# Patient Record
Sex: Female | Born: 1955 | Race: White | Hispanic: No | Marital: Married | Smoking: Former smoker
Health system: Southern US, Community
[De-identification: ages and names within clinical notes are randomized; demographics above are authoritative.]

## PROBLEM LIST (undated history)

## (undated) DIAGNOSIS — Z87898 Personal history of other specified conditions: Secondary | ICD-10-CM

## (undated) DIAGNOSIS — M797 Fibromyalgia: Secondary | ICD-10-CM

## (undated) DIAGNOSIS — F329 Major depressive disorder, single episode, unspecified: Secondary | ICD-10-CM

## (undated) DIAGNOSIS — J449 Chronic obstructive pulmonary disease, unspecified: Secondary | ICD-10-CM

## (undated) DIAGNOSIS — E785 Hyperlipidemia, unspecified: Secondary | ICD-10-CM

## (undated) DIAGNOSIS — Z87442 Personal history of urinary calculi: Secondary | ICD-10-CM

## (undated) DIAGNOSIS — F419 Anxiety disorder, unspecified: Secondary | ICD-10-CM

## (undated) DIAGNOSIS — Z9889 Other specified postprocedural states: Secondary | ICD-10-CM

## (undated) DIAGNOSIS — Z86010 Personal history of colonic polyps: Secondary | ICD-10-CM

## (undated) DIAGNOSIS — D649 Anemia, unspecified: Secondary | ICD-10-CM

## (undated) DIAGNOSIS — N189 Chronic kidney disease, unspecified: Secondary | ICD-10-CM

## (undated) DIAGNOSIS — C801 Malignant (primary) neoplasm, unspecified: Secondary | ICD-10-CM

## (undated) DIAGNOSIS — I10 Essential (primary) hypertension: Secondary | ICD-10-CM

## (undated) DIAGNOSIS — N201 Calculus of ureter: Secondary | ICD-10-CM

## (undated) DIAGNOSIS — F32A Depression, unspecified: Secondary | ICD-10-CM

## (undated) DIAGNOSIS — G2581 Restless legs syndrome: Secondary | ICD-10-CM

## (undated) DIAGNOSIS — Z860101 Personal history of adenomatous and serrated colon polyps: Secondary | ICD-10-CM

## (undated) DIAGNOSIS — G8929 Other chronic pain: Secondary | ICD-10-CM

## (undated) DIAGNOSIS — M1990 Unspecified osteoarthritis, unspecified site: Secondary | ICD-10-CM

## (undated) DIAGNOSIS — E079 Disorder of thyroid, unspecified: Secondary | ICD-10-CM

## (undated) DIAGNOSIS — K760 Fatty (change of) liver, not elsewhere classified: Secondary | ICD-10-CM

## (undated) DIAGNOSIS — M549 Dorsalgia, unspecified: Secondary | ICD-10-CM

## (undated) DIAGNOSIS — U099 Post covid-19 condition, unspecified: Secondary | ICD-10-CM

## (undated) DIAGNOSIS — R918 Other nonspecific abnormal finding of lung field: Secondary | ICD-10-CM

## (undated) DIAGNOSIS — Z859 Personal history of malignant neoplasm, unspecified: Secondary | ICD-10-CM

## (undated) DIAGNOSIS — R319 Hematuria, unspecified: Secondary | ICD-10-CM

## (undated) DIAGNOSIS — R011 Cardiac murmur, unspecified: Secondary | ICD-10-CM

## (undated) DIAGNOSIS — J189 Pneumonia, unspecified organism: Secondary | ICD-10-CM

## (undated) DIAGNOSIS — R569 Unspecified convulsions: Secondary | ICD-10-CM

## (undated) DIAGNOSIS — R0600 Dyspnea, unspecified: Secondary | ICD-10-CM

## (undated) DIAGNOSIS — M810 Age-related osteoporosis without current pathological fracture: Secondary | ICD-10-CM

## (undated) DIAGNOSIS — K219 Gastro-esophageal reflux disease without esophagitis: Secondary | ICD-10-CM

## (undated) DIAGNOSIS — N3010 Interstitial cystitis (chronic) without hematuria: Secondary | ICD-10-CM

## (undated) DIAGNOSIS — E039 Hypothyroidism, unspecified: Secondary | ICD-10-CM

## (undated) HISTORY — PX: SPINAL CORD STIMULATOR IMPLANT: SHX2422

## (undated) HISTORY — DX: Depression, unspecified: F32.A

## (undated) HISTORY — DX: Anemia, unspecified: D64.9

## (undated) HISTORY — PX: UPPER GASTROINTESTINAL ENDOSCOPY: SHX188

## (undated) HISTORY — DX: Chronic kidney disease, unspecified: N18.9

## (undated) HISTORY — PX: CARPAL TUNNEL RELEASE: SHX101

## (undated) HISTORY — DX: Personal history of colonic polyps: Z86.010

## (undated) HISTORY — PX: COLONOSCOPY: SHX174

## (undated) HISTORY — DX: Fibromyalgia: M79.7

## (undated) HISTORY — DX: Cardiac murmur, unspecified: R01.1

## (undated) HISTORY — PX: OTHER SURGICAL HISTORY: SHX169

## (undated) HISTORY — DX: Unspecified convulsions: R56.9

## (undated) HISTORY — DX: Gastro-esophageal reflux disease without esophagitis: K21.9

## (undated) HISTORY — DX: Anxiety disorder, unspecified: F41.9

## (undated) HISTORY — DX: Malignant (primary) neoplasm, unspecified: C80.1

## (undated) HISTORY — DX: Dorsalgia, unspecified: M54.9

## (undated) HISTORY — DX: Personal history of adenomatous and serrated colon polyps: Z86.0101

## (undated) HISTORY — DX: Other chronic pain: G89.29

## (undated) HISTORY — DX: Age-related osteoporosis without current pathological fracture: M81.0

## (undated) HISTORY — DX: Major depressive disorder, single episode, unspecified: F32.9

## (undated) HISTORY — DX: Disorder of thyroid, unspecified: E07.9

---
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
## 1982-10-25 HISTORY — PX: VAGINAL HYSTERECTOMY: SUR661

## 1990-10-25 HISTORY — PX: CERVICAL FUSION: SHX112

## 1993-10-25 HISTORY — PX: CHOLECYSTECTOMY: SHX55

## 1994-10-25 HISTORY — PX: LUMBAR FUSION: SHX111

## 1996-10-25 HISTORY — PX: CARPAL TUNNEL RELEASE: SHX101

## 1998-03-13 ENCOUNTER — Inpatient Hospital Stay (HOSPITAL_COMMUNITY): Admission: EM | Admit: 1998-03-13 | Discharge: 1998-03-17 | Payer: Self-pay | Admitting: Emergency Medicine

## 2001-04-12 ENCOUNTER — Emergency Department (HOSPITAL_COMMUNITY): Admission: EM | Admit: 2001-04-12 | Discharge: 2001-04-12 | Payer: Self-pay | Admitting: Emergency Medicine

## 2001-04-12 ENCOUNTER — Encounter: Payer: Self-pay | Admitting: Emergency Medicine

## 2002-10-25 HISTORY — PX: ROTATOR CUFF REPAIR: SHX139

## 2004-01-03 ENCOUNTER — Observation Stay (HOSPITAL_COMMUNITY): Admission: RE | Admit: 2004-01-03 | Discharge: 2004-01-04 | Payer: Self-pay | Admitting: General Surgery

## 2004-01-03 HISTORY — PX: HEMORRHOID SURGERY: SHX153

## 2004-02-05 ENCOUNTER — Ambulatory Visit (HOSPITAL_COMMUNITY): Admission: RE | Admit: 2004-02-05 | Discharge: 2004-02-05 | Payer: Self-pay | Admitting: General Surgery

## 2004-02-05 HISTORY — PX: WRIST GANGLION EXCISION: SUR520

## 2004-05-12 ENCOUNTER — Encounter: Admission: RE | Admit: 2004-05-12 | Discharge: 2004-05-12 | Payer: Self-pay | Admitting: Orthopedic Surgery

## 2004-09-01 ENCOUNTER — Ambulatory Visit: Payer: Self-pay | Admitting: Internal Medicine

## 2004-09-02 ENCOUNTER — Ambulatory Visit: Payer: Self-pay

## 2004-09-02 HISTORY — PX: CARDIOVASCULAR STRESS TEST: SHX262

## 2004-09-16 ENCOUNTER — Ambulatory Visit: Payer: Self-pay | Admitting: Internal Medicine

## 2004-11-06 ENCOUNTER — Ambulatory Visit: Payer: Self-pay | Admitting: Internal Medicine

## 2004-11-12 ENCOUNTER — Ambulatory Visit: Payer: Self-pay | Admitting: Internal Medicine

## 2005-02-10 ENCOUNTER — Ambulatory Visit: Payer: Self-pay | Admitting: Internal Medicine

## 2005-03-26 ENCOUNTER — Ambulatory Visit (HOSPITAL_COMMUNITY): Admission: RE | Admit: 2005-03-26 | Discharge: 2005-03-26 | Payer: Self-pay | Admitting: Pediatrics

## 2005-04-22 ENCOUNTER — Encounter (INDEPENDENT_AMBULATORY_CARE_PROVIDER_SITE_OTHER): Payer: Self-pay | Admitting: Specialist

## 2005-04-22 ENCOUNTER — Encounter: Admission: RE | Admit: 2005-04-22 | Discharge: 2005-04-22 | Payer: Self-pay | Admitting: General Surgery

## 2005-05-13 ENCOUNTER — Ambulatory Visit: Payer: Self-pay | Admitting: Internal Medicine

## 2005-06-11 ENCOUNTER — Ambulatory Visit: Payer: Self-pay | Admitting: Internal Medicine

## 2005-07-06 ENCOUNTER — Ambulatory Visit: Payer: Self-pay | Admitting: Internal Medicine

## 2005-09-06 ENCOUNTER — Ambulatory Visit: Payer: Self-pay | Admitting: Internal Medicine

## 2005-11-08 ENCOUNTER — Ambulatory Visit: Payer: Self-pay | Admitting: Internal Medicine

## 2005-11-17 ENCOUNTER — Ambulatory Visit: Payer: Self-pay | Admitting: Internal Medicine

## 2005-11-24 ENCOUNTER — Encounter (INDEPENDENT_AMBULATORY_CARE_PROVIDER_SITE_OTHER): Payer: Self-pay | Admitting: Specialist

## 2005-11-24 ENCOUNTER — Ambulatory Visit: Payer: Self-pay | Admitting: Internal Medicine

## 2005-12-08 ENCOUNTER — Ambulatory Visit: Payer: Self-pay | Admitting: Internal Medicine

## 2006-01-26 ENCOUNTER — Ambulatory Visit: Payer: Self-pay | Admitting: Internal Medicine

## 2006-03-04 ENCOUNTER — Ambulatory Visit (HOSPITAL_COMMUNITY): Admission: RE | Admit: 2006-03-04 | Discharge: 2006-03-04 | Payer: Self-pay | Admitting: Urology

## 2006-03-04 HISTORY — PX: OTHER SURGICAL HISTORY: SHX169

## 2006-03-28 ENCOUNTER — Ambulatory Visit: Payer: Self-pay | Admitting: Internal Medicine

## 2006-04-01 ENCOUNTER — Ambulatory Visit (HOSPITAL_BASED_OUTPATIENT_CLINIC_OR_DEPARTMENT_OTHER): Admission: RE | Admit: 2006-04-01 | Discharge: 2006-04-01 | Payer: Self-pay | Admitting: Urology

## 2006-05-24 ENCOUNTER — Ambulatory Visit: Payer: Self-pay | Admitting: Internal Medicine

## 2006-05-26 ENCOUNTER — Ambulatory Visit (HOSPITAL_COMMUNITY): Admission: RE | Admit: 2006-05-26 | Discharge: 2006-05-26 | Payer: Self-pay | Admitting: General Surgery

## 2006-05-31 ENCOUNTER — Ambulatory Visit: Payer: Self-pay | Admitting: Internal Medicine

## 2006-05-31 ENCOUNTER — Other Ambulatory Visit: Admission: RE | Admit: 2006-05-31 | Discharge: 2006-05-31 | Payer: Self-pay | Admitting: Internal Medicine

## 2006-05-31 ENCOUNTER — Encounter: Payer: Self-pay | Admitting: Internal Medicine

## 2006-08-01 ENCOUNTER — Ambulatory Visit: Payer: Self-pay | Admitting: Internal Medicine

## 2006-09-06 ENCOUNTER — Ambulatory Visit: Payer: Self-pay | Admitting: Internal Medicine

## 2006-09-16 ENCOUNTER — Ambulatory Visit: Payer: Self-pay | Admitting: Internal Medicine

## 2006-10-31 ENCOUNTER — Ambulatory Visit: Payer: Self-pay | Admitting: Internal Medicine

## 2006-11-04 ENCOUNTER — Ambulatory Visit: Payer: Self-pay | Admitting: Internal Medicine

## 2006-11-07 ENCOUNTER — Ambulatory Visit: Payer: Self-pay | Admitting: Internal Medicine

## 2006-11-07 LAB — CONVERTED CEMR LAB
ALT: 92 units/L — ABNORMAL HIGH (ref 0–40)
AST: 49 units/L — ABNORMAL HIGH (ref 0–37)
Albumin: 3.8 g/dL (ref 3.5–5.2)
Alkaline Phosphatase: 80 units/L (ref 39–117)
Bilirubin, Direct: 0.1 mg/dL (ref 0.0–0.3)
Chol/HDL Ratio, serum: 3.4
Cholesterol: 228 mg/dL (ref 0–200)
HDL: 66.1 mg/dL (ref >39.0)
LDL DIRECT: 142.5 mg/dL
Total Bilirubin: 0.7 mg/dL (ref 0.3–1.2)
Total Protein: 6.2 g/dL (ref 6.0–8.3)
Triglyceride fasting, serum: 59 mg/dL (ref 0–149)
VLDL: 12 mg/dL (ref 0–40)

## 2006-11-18 ENCOUNTER — Ambulatory Visit: Payer: Self-pay | Admitting: Internal Medicine

## 2006-12-06 ENCOUNTER — Encounter: Admission: RE | Admit: 2006-12-06 | Discharge: 2007-03-06 | Payer: Self-pay | Admitting: Neurology

## 2006-12-15 ENCOUNTER — Ambulatory Visit: Payer: Self-pay | Admitting: Internal Medicine

## 2007-01-06 ENCOUNTER — Ambulatory Visit: Payer: Self-pay | Admitting: Internal Medicine

## 2007-01-06 LAB — CONVERTED CEMR LAB
ALT: 117 units/L — ABNORMAL HIGH (ref 0–40)
AST: 48 units/L — ABNORMAL HIGH (ref 0–37)
Albumin: 3.9 g/dL (ref 3.5–5.2)
Alkaline Phosphatase: 106 units/L (ref 39–117)
Total Bilirubin: 0.6 mg/dL (ref 0.3–1.2)
Total Protein: 6.5 g/dL (ref 6.0–8.3)

## 2007-02-06 ENCOUNTER — Encounter: Admission: RE | Admit: 2007-02-06 | Discharge: 2007-02-06 | Payer: Self-pay | Admitting: Neurology

## 2007-03-09 ENCOUNTER — Ambulatory Visit: Payer: Self-pay | Admitting: Internal Medicine

## 2007-03-09 LAB — CONVERTED CEMR LAB
ALT: 31 units/L (ref 0–40)
Alkaline Phosphatase: 80 units/L (ref 39–117)
Bilirubin, Direct: 0.2 mg/dL (ref 0.0–0.3)
Direct LDL: 142.9 mg/dL
Total Bilirubin: 0.5 mg/dL (ref 0.3–1.2)
Total CHOL/HDL Ratio: 3.6

## 2007-04-12 ENCOUNTER — Ambulatory Visit: Payer: Self-pay | Admitting: Internal Medicine

## 2007-04-21 DIAGNOSIS — IMO0001 Reserved for inherently not codable concepts without codable children: Secondary | ICD-10-CM

## 2007-04-21 DIAGNOSIS — M797 Fibromyalgia: Secondary | ICD-10-CM | POA: Insufficient documentation

## 2007-04-21 DIAGNOSIS — M545 Low back pain: Secondary | ICD-10-CM

## 2007-06-09 ENCOUNTER — Ambulatory Visit: Payer: Self-pay | Admitting: Internal Medicine

## 2007-06-09 DIAGNOSIS — G43109 Migraine with aura, not intractable, without status migrainosus: Secondary | ICD-10-CM | POA: Insufficient documentation

## 2007-06-09 DIAGNOSIS — R413 Other amnesia: Secondary | ICD-10-CM

## 2007-08-11 ENCOUNTER — Ambulatory Visit: Payer: Self-pay | Admitting: Internal Medicine

## 2007-09-13 ENCOUNTER — Telehealth: Payer: Self-pay | Admitting: Internal Medicine

## 2007-12-21 ENCOUNTER — Ambulatory Visit: Payer: Self-pay | Admitting: Internal Medicine

## 2007-12-21 DIAGNOSIS — M7520 Bicipital tendinitis, unspecified shoulder: Secondary | ICD-10-CM | POA: Insufficient documentation

## 2007-12-21 DIAGNOSIS — Z87891 Personal history of nicotine dependence: Secondary | ICD-10-CM | POA: Insufficient documentation

## 2008-02-17 DIAGNOSIS — Z9089 Acquired absence of other organs: Secondary | ICD-10-CM

## 2008-02-17 DIAGNOSIS — K645 Perianal venous thrombosis: Secondary | ICD-10-CM | POA: Insufficient documentation

## 2008-02-17 DIAGNOSIS — D126 Benign neoplasm of colon, unspecified: Secondary | ICD-10-CM | POA: Insufficient documentation

## 2008-02-17 DIAGNOSIS — E785 Hyperlipidemia, unspecified: Secondary | ICD-10-CM | POA: Insufficient documentation

## 2008-02-17 DIAGNOSIS — N200 Calculus of kidney: Secondary | ICD-10-CM | POA: Insufficient documentation

## 2008-02-17 DIAGNOSIS — Z9889 Other specified postprocedural states: Secondary | ICD-10-CM

## 2008-02-17 DIAGNOSIS — E8941 Symptomatic postprocedural ovarian failure: Secondary | ICD-10-CM

## 2008-03-19 ENCOUNTER — Ambulatory Visit: Payer: Self-pay | Admitting: Internal Medicine

## 2008-03-19 DIAGNOSIS — F339 Major depressive disorder, recurrent, unspecified: Secondary | ICD-10-CM | POA: Insufficient documentation

## 2008-03-19 DIAGNOSIS — N760 Acute vaginitis: Secondary | ICD-10-CM | POA: Insufficient documentation

## 2008-03-19 LAB — CONVERTED CEMR LAB
Albumin: 3.8 g/dL (ref 3.5–5.2)
Total Bilirubin: 0.5 mg/dL (ref 0.3–1.2)
Total CHOL/HDL Ratio: 4.2
VLDL: 11 mg/dL (ref 0–40)

## 2008-06-25 ENCOUNTER — Telehealth: Payer: Self-pay | Admitting: Internal Medicine

## 2008-06-26 ENCOUNTER — Ambulatory Visit: Payer: Self-pay | Admitting: Internal Medicine

## 2008-06-26 ENCOUNTER — Inpatient Hospital Stay (HOSPITAL_COMMUNITY): Admission: AD | Admit: 2008-06-26 | Discharge: 2008-07-02 | Payer: Self-pay | Admitting: *Deleted

## 2008-06-26 ENCOUNTER — Ambulatory Visit: Payer: Self-pay | Admitting: *Deleted

## 2008-06-26 DIAGNOSIS — F333 Major depressive disorder, recurrent, severe with psychotic symptoms: Secondary | ICD-10-CM | POA: Insufficient documentation

## 2008-07-02 ENCOUNTER — Telehealth: Payer: Self-pay | Admitting: Internal Medicine

## 2008-07-10 ENCOUNTER — Ambulatory Visit (HOSPITAL_COMMUNITY): Payer: Self-pay | Admitting: Psychiatry

## 2008-07-18 ENCOUNTER — Encounter: Payer: Self-pay | Admitting: Internal Medicine

## 2008-07-22 ENCOUNTER — Ambulatory Visit (HOSPITAL_COMMUNITY): Payer: Self-pay | Admitting: Psychiatry

## 2008-07-25 ENCOUNTER — Ambulatory Visit (HOSPITAL_COMMUNITY): Admission: RE | Admit: 2008-07-25 | Discharge: 2008-07-25 | Payer: Self-pay | Admitting: Preventative Medicine

## 2008-07-29 ENCOUNTER — Other Ambulatory Visit: Admission: RE | Admit: 2008-07-29 | Discharge: 2008-07-29 | Payer: Self-pay | Admitting: Gynecology

## 2008-07-29 ENCOUNTER — Ambulatory Visit: Payer: Self-pay | Admitting: Gynecology

## 2008-10-11 ENCOUNTER — Telehealth: Payer: Self-pay | Admitting: Internal Medicine

## 2008-12-27 ENCOUNTER — Ambulatory Visit (HOSPITAL_BASED_OUTPATIENT_CLINIC_OR_DEPARTMENT_OTHER): Admission: RE | Admit: 2008-12-27 | Discharge: 2008-12-27 | Payer: Self-pay | Admitting: Urology

## 2009-01-20 ENCOUNTER — Ambulatory Visit: Payer: Self-pay | Admitting: Internal Medicine

## 2009-01-20 DIAGNOSIS — N3010 Interstitial cystitis (chronic) without hematuria: Secondary | ICD-10-CM | POA: Insufficient documentation

## 2009-03-10 ENCOUNTER — Telehealth: Payer: Self-pay | Admitting: Internal Medicine

## 2009-03-14 ENCOUNTER — Ambulatory Visit: Payer: Self-pay | Admitting: Internal Medicine

## 2009-03-14 DIAGNOSIS — G2581 Restless legs syndrome: Secondary | ICD-10-CM | POA: Insufficient documentation

## 2009-03-14 LAB — CONVERTED CEMR LAB
Cholesterol, target level: 200 mg/dL
HDL goal, serum: 40 mg/dL
LDL Goal: 130 mg/dL

## 2009-04-03 ENCOUNTER — Ambulatory Visit (HOSPITAL_COMMUNITY): Admission: RE | Admit: 2009-04-03 | Discharge: 2009-04-03 | Payer: Self-pay | Admitting: General Surgery

## 2009-05-16 ENCOUNTER — Ambulatory Visit: Payer: Self-pay | Admitting: Internal Medicine

## 2009-05-16 DIAGNOSIS — R252 Cramp and spasm: Secondary | ICD-10-CM | POA: Insufficient documentation

## 2009-05-16 LAB — CONVERTED CEMR LAB
BUN: 20 mg/dL (ref 6–23)
CO2: 27 meq/L (ref 19–32)
Calcium: 9.3 mg/dL (ref 8.4–10.5)
GFR calc non Af Amer: 68.99 mL/min (ref >60)
Glucose, Bld: 94 mg/dL (ref 70–99)
Sodium: 141 meq/L (ref 135–145)

## 2009-08-18 ENCOUNTER — Ambulatory Visit: Payer: Self-pay | Admitting: Internal Medicine

## 2009-08-18 LAB — CONVERTED CEMR LAB
CO2: 26 meq/L (ref 19–32)
Calcium: 9.1 mg/dL (ref 8.4–10.5)
Chloride: 109 meq/L (ref 96–112)
Cholesterol: 257 mg/dL — ABNORMAL HIGH (ref 0–200)
Creatinine, Ser: 0.7 mg/dL (ref 0.4–1.2)
Glucose, Bld: 95 mg/dL (ref 70–99)
TSH: 2.28 microintl units/mL (ref 0.35–5.50)

## 2009-11-18 ENCOUNTER — Ambulatory Visit: Payer: Self-pay | Admitting: Internal Medicine

## 2009-11-18 DIAGNOSIS — J0100 Acute maxillary sinusitis, unspecified: Secondary | ICD-10-CM

## 2010-02-10 ENCOUNTER — Ambulatory Visit: Payer: Self-pay | Admitting: Internal Medicine

## 2010-02-10 LAB — CONVERTED CEMR LAB
Basophils Relative: 0.8 % (ref 0.0–3.0)
Eosinophils Absolute: 0 10*3/uL (ref 0.0–0.7)
Eosinophils Relative: 0.6 % (ref 0.0–5.0)
HCT: 43.2 % (ref 36.0–46.0)
HDL goal, serum: 40 mg/dL
HDL: 77 mg/dL (ref >39.00)
Lymphs Abs: 2.2 10*3/uL (ref 0.7–4.0)
MCV: 97.2 fL (ref 78.0–100.0)
Monocytes Absolute: 0.4 10*3/uL (ref 0.1–1.0)
Platelets: 188 10*3/uL (ref 150.0–400.0)
RDW: 14 % (ref 11.5–14.6)
WBC: 8.2 10*3/uL (ref 4.5–10.5)

## 2010-02-25 ENCOUNTER — Ambulatory Visit: Payer: Self-pay | Admitting: Licensed Clinical Social Worker

## 2010-03-11 ENCOUNTER — Ambulatory Visit: Payer: Self-pay | Admitting: Licensed Clinical Social Worker

## 2010-03-18 ENCOUNTER — Ambulatory Visit: Payer: Self-pay | Admitting: Licensed Clinical Social Worker

## 2010-03-25 ENCOUNTER — Ambulatory Visit: Payer: Self-pay | Admitting: Licensed Clinical Social Worker

## 2010-04-01 ENCOUNTER — Ambulatory Visit: Payer: Self-pay | Admitting: Licensed Clinical Social Worker

## 2010-04-08 ENCOUNTER — Ambulatory Visit: Payer: Self-pay | Admitting: Licensed Clinical Social Worker

## 2010-04-16 ENCOUNTER — Ambulatory Visit (HOSPITAL_COMMUNITY): Admission: RE | Admit: 2010-04-16 | Discharge: 2010-04-16 | Payer: Self-pay | Admitting: Internal Medicine

## 2010-04-16 LAB — HM MAMMOGRAPHY

## 2010-05-12 ENCOUNTER — Ambulatory Visit: Payer: Self-pay | Admitting: Internal Medicine

## 2010-05-13 ENCOUNTER — Telehealth: Payer: Self-pay | Admitting: Internal Medicine

## 2010-11-24 NOTE — Assessment & Plan Note (Signed)
Summary: 3 month fup//ccm   Vital Signs:  Patient profile:   55 year old female Height:      62 inches Weight:      120 pounds BMI:     22.03 Temp:     98.2 degrees F oral Pulse rate:   72 / minute Resp:     14 per minute BP sitting:   120 / 70  (left arm)  Vitals Entered By: Willy Eddy, LPN (May 12, 2010 2:03 PM) CC: roa   CC:  roa.  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) 6)  Vaginitis, Bacterial, Recurrent  (ICD-616.10) 7)  Depression  (ICD-311) 8)  Cholecystectomy, Laparoscopic, Hx of  (ICD-V45.79) 9)  Hemorrhoidectomy, Hx of  (ICD-V45.89) 10)  Menopause, Surgical  (ICD-627.4) 11)  Hyperlipidemia  (ICD-272.4) 12)  Tubulovillous Adenoma, Colon  (ICD-211.3) 13)  Hemorrhoid, Thrombosed  (ICD-455.7) 14)  Nephrolithiasis  (ICD-592.0) 15)  Bicipital Tenosynovitis  (ICD-726.12) 16)  Tobacco Use  (ICD-305.1) 17)  Preventive Health Care  (ICD-V70.0) 18)  Symptom, Memory Loss  (ICD-780.93) 19)  Migraine, Classical w/o Intractable Migraine  (ICD-346.00) 20)  Fibromyalgia  (ICD-729.1) 21)  Back Pain, Chronic  (ICD-724.5)  Current Problems (verified): 1)  Acute Maxillary Sinusitis  (ICD-461.0) 2)  Leg Cramps  (ICD-729.82) 3)  Restless Leg Syndrome, Severe  (ICD-333.94) 4)  Chronic Interstitial Cystitis  (ICD-595.1) 5)  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  W/psychot Bhv  (ICD-296.34) 6)  Vaginitis, Bacterial, Recurrent  (ICD-616.10) 7)  Depression  (ICD-311) 8)  Cholecystectomy, Laparoscopic, Hx of  (ICD-V45.79) 9)  Hemorrhoidectomy, Hx of  (ICD-V45.89) 10)  Menopause, Surgical  (ICD-627.4) 11)  Hyperlipidemia  (ICD-272.4) 12)  Tubulovillous Adenoma, Colon  (ICD-211.3) 13)  Hemorrhoid, Thrombosed  (ICD-455.7) 14)  Nephrolithiasis  (ICD-592.0) 15)  Bicipital Tenosynovitis  (ICD-726.12) 16)  Tobacco Use  (ICD-305.1) 17)  Preventive Health Care  (ICD-V70.0) 18)  Symptom, Memory Loss  (ICD-780.93) 19)  Migraine, Classical w/o Intractable Migraine  (ICD-346.00) 20)  Fibromyalgia  (ICD-729.1) 21)  Back Pain, Chronic  (ICD-724.5)  Medications Prior to Update: 1)  Methadone Hcl 5 Mg  Tabs (Methadone Hcl) .... Take 1 Tablet By Mouth Tid 2)  Multivitamin .... Take 1 Tablet By Mouth Once A Day 3)  Celebrex 200 Mg  Caps (Celecoxib) .... Take 1 Tablet By Mouth Two Times A Day As Needed 4)  Baclofen 10 Mg  Tabs (Baclofen) .... As Needed 5)  Topamax 200 Mg  Tabs (Topiramate) .... Take 1 Tablet By Mouth Once A Day 6)  Elmiron 100 Mg  Caps (Pentosan Polysulfate Sodium) .... Three Times A Day 7)  Oxytrol 3.9 Mg/24hr  Pttw (Oxybutynin) .... Change 2 Times A Week 8)  Sanctura Xr 60 Mg  Cp24 (Trospium Chloride) .... Once Daily 9)  Vitamin D 16109 Unit  Caps (Ergocalciferol) .... One By Mouth Weekly  10)  Cyanocobalamin 1000 Mcg/ml Inj Soln (Cyanocobalamin) .Marland Kitchen.. 1 Ml Every Month 11)  Prozac 20 Mg Caps (Fluoxetine Hcl) .Marland Kitchen.. 1 Once Daily 12)  Trazodone Hcl 150 Mg Tabs (Trazodone Hcl) .Marland Kitchen.. 1 Once Daily 13)  Vivelle-Dot 0.05 Mg/24hr Pttw (Estradiol) .... Change Twice A Week 14)  Trimethoprim 100 Mg Tabs (Trimethoprim) .Marland Kitchen.. 1 Once Daily 15)  Azo Standard Maximum Strength 97.5 Mg Tabs (Phenazopyridine Hcl) .... As Needed 16)  Bromfed Dm 30-2-10 Mg/7ml Syrp (Pseudoeph-Bromphen-Dm) .... Two Tsp By Mouth Q 6 Hours Prn 17)  Oxycodone Hcl 5 Mg Tabs (Oxycodone Hcl)  .... One By Mouth Q 6 Hour For Break Through  Current Medications (verified): 1)  Methadone Hcl 5 Mg  Tabs (Methadone Hcl) .... Take 1 Tablet By Mouth Tid 2)  Multivitamin .... Take 1 Tablet By Mouth Once A Day 3)  Celebrex 200 Mg  Caps (Celecoxib) .... Take 1 Tablet By Mouth Two Times A Day As Needed 4)  Baclofen 10 Mg  Tabs (Baclofen) .... As Needed 5)  Topamax 200 Mg  Tabs (Topiramate) .... Take 1 Tablet By Mouth Once A Day 6)  Elmiron 100 Mg  Caps (Pentosan Polysulfate Sodium) .... Three Times A Day 7)  Oxytrol 3.9 Mg/24hr  Pttw (Oxybutynin) .... Change 2 Times A Week 8)  Sanctura Xr 60 Mg  Cp24 (Trospium Chloride) .... Once Daily 9)  Vitamin D 11914 Unit  Caps (Ergocalciferol) .... One By Mouth Weekly 10)  Cyanocobalamin 1000 Mcg/ml Inj Soln (Cyanocobalamin) .Marland Kitchen.. 1 Ml Every Month 11)  Fluoxetine Hcl 40 Mg Caps (Fluoxetine Hcl) .... One By Mouth Daily 12)  Trazodone Hcl 150 Mg Tabs (Trazodone Hcl) .Marland Kitchen.. 1 Once Daily 13)  Vivelle-Dot 0.05 Mg/24hr Pttw (Estradiol) .... Change Twice A Week 14)  Trimethoprim 100 Mg Tabs (Trimethoprim) .Marland Kitchen.. 1 Once Daily 15)  Azo Standard Maximum Strength 97.5 Mg Tabs (Phenazopyridine Hcl) .... As Needed 16)  Bromfed Dm 30-2-10 Mg/90ml Syrp (Pseudoeph-Bromphen-Dm) .... Two Tsp By Mouth Q 6 Hours Prn 17)  Oxycodone Hcl 5 Mg Tabs (Oxycodone Hcl) .... One By Mouth Q 6 Hour For Break Through  Allergies (verified): 1)  ! Vicodin  Past History:  Family History: Last updated: 04/21/2007 Fam hx MI Family History Hypertension Family History of Cardiovascular disorder Family History Other cancer-Colon  Social History: Last updated: 04/21/2007 Married Current Smoker Alcohol use-yes  Risk Factors: Smoking Status: current (05/12/2010) Packs/Day: 1 (05/12/2010) Passive Smoke Exposure: yes (05/12/2010)  Past medical, surgical, family and social histories (including risk factors) reviewed, and no changes noted (except as noted below).  Past Medical  History: Reviewed history from 03/19/2008 and no changes required. fibromyalgia chronic back pain migraine HA Depression  Past Surgical History: Reviewed history from 02/17/2008 and no changes required. Lumbar Fusion [phone] Colonoscopy-11/18/2006 Carpal tunnel release Cholecystectomy-1995 Hemorrhoidectomy Hysterectomy-1984 Tonsillectomy-1964 multiple back surgery  Family History: Reviewed history from 04/21/2007 and no changes required. Fam hx MI Family History Hypertension Family History of Cardiovascular disorder Family History Other cancer-Colon  Social History: Reviewed history from 04/21/2007 and no changes required. Married Current Smoker Alcohol use-yes  Review of Systems  The patient denies anorexia, fever, weight loss, weight gain, vision loss, decreased hearing, hoarseness, chest pain, syncope, dyspnea on exertion, peripheral edema, prolonged cough, headaches, hemoptysis, abdominal pain, melena, hematochezia, severe indigestion/heartburn, hematuria, incontinence, genital sores, muscle weakness, suspicious skin lesions, transient blindness, difficulty walking, depression, unusual weight change, abnormal bleeding, enlarged lymph nodes, angioedema, and breast masses.    Physical Exam  General:  alert, underweight  appearing, and cachetic.   Head:  normocephalic.  atraumatic.   Eyes:  pupils equal and pupils round.  pupils reactive to light.   Ears:  R ear normal and L ear normal.   Nose:  mucosal edema and airflow obstruction.   Mouth:  posterior lymphoid hypertrophy and postnasal drip.   Neck:  No deformities, masses, or tenderness noted. Lungs:  normal respiratory effort and no wheezes.   Heart:  normal rate and regular rhythm.   Abdomen:  soft, non-tender, and normal bowel sounds.     Impression & Recommendations:  Problem # 1:  FIBROMYALGIA (ICD-729.1)  Her updated medication list for this problem includes:    Methadone Hcl 5 Mg Tabs (Methadone hcl)  .Marland Kitchen... Take 1 tablet by mouth tid    Celebrex 200 Mg Caps (Celecoxib) .Marland Kitchen... Take 1 tablet by mouth two times a day as needed    Baclofen 10 Mg Tabs (Baclofen) .Marland Kitchen... As needed    Oxycodone Hcl 5 Mg Tabs (Oxycodone hcl) ..... 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   Orders: Tobacco use cessation intermediate 3-10 minutes (99406)  Problem # 4:  CHRONIC INTERSTITIAL CYSTITIS (ICD-595.1) stable  Complete Medication List: 1)  Methadone Hcl 5 Mg Tabs (Methadone hcl) .... Take 1 tablet by mouth tid 2)  Multivitamin  .... Take 1 tablet by mouth once a day 3)  Celebrex 200 Mg Caps (Celecoxib) .... Take 1 tablet by mouth two times a day as needed 4)  Baclofen 10 Mg Tabs (Baclofen) .... As needed 5)  Topamax 200 Mg Tabs (Topiramate) .... Take 1 tablet by mouth once a day 6)  Elmiron 100 Mg Caps (Pentosan polysulfate sodium) .... Three times a day 7)  Oxytrol 3.9 Mg/24hr Pttw (Oxybutynin) .... Change 2 times a week 8)  Sanctura Xr 60 Mg Cp24 (Trospium chloride) .... Once daily 9)  Vitamin D 44010 Unit Caps (Ergocalciferol) .... One by mouth weekly 10)  Cyanocobalamin 1000 Mcg/ml Inj Soln (Cyanocobalamin) .Marland Kitchen.. 1 ml every month 11)  Fluoxetine Hcl 40 Mg Caps (Fluoxetine hcl) .... One by mouth daily 12)  Trazodone Hcl 150 Mg Tabs (Trazodone hcl) .Marland Kitchen.. 1 once daily 13)  Vivelle-dot 0.05 Mg/24hr Pttw (Estradiol) .... Change twice a week 14)  Trimethoprim 100 Mg Tabs (Trimethoprim) .Marland Kitchen.. 1 once daily 15)  Azo Standard Maximum Strength 97.5 Mg Tabs (Phenazopyridine hcl) .... As needed 16)  Bromfed Dm 30-2-10 Mg/10ml Syrp (Pseudoeph-bromphen-dm) .... Two tsp by mouth q 6 hours prn 17)  Oxycodone Hcl 5 Mg Tabs (Oxycodone hcl) .... One by mouth q 6 hour for  break through  Patient Instructions: 1)  Please schedule a follow-up appointment in 3 months. Prescriptions: FLUOXETINE HCL 40 MG CAPS (FLUOXETINE HCL) one by mouth daily  #90 x 3   Entered and Authorized by:   Stacie Glaze MD   Signed by:   Stacie Glaze MD on 05/12/2010   Method used:   Electronically to        [facility] (retail)       [address] [place], [place]  [address]       Ph: [phone]  Fax: [phone]   RxID:   [id number] METHADONE HCL 5 MG  TABS (METHADONE HCL) Take 1 tablet by mouth TID  #90 x 0   Entered by:   Willy Eddy, LPN   Authorized by:   Stacie Glaze MD   Signed by:   Willy Eddy, LPN on 56/43/3295   Method used:   Print then Give to Patient   RxID:   [id number] CYANOCOBALAMIN 1000 MCG/ML INJ SOLN (CYANOCOBALAMIN) 1 ml every month  #31ml vial x 1   Entered by:   Willy Eddy, LPN   Authorized by:   Stacie Glaze MD   Signed by:   Willy Eddy, LPN on 93/23/5573   Method used:   Print then Give to Patient   RxID:   [id number]

## 2010-11-24 NOTE — Assessment & Plan Note (Signed)
Summary: 3 month rov/njr   Vital Signs:  Patient profile:   55 year old female Height:      62 inches Weight:      112 pounds BMI:     20.56 Temp:     98.2 degrees F oral Pulse rate:   72 / minute Resp:     12 per minute BP sitting:   120 / 70  (left arm)  Vitals Entered By: Willy Eddy, LPN (November 18, 2009 11:35 AM) CC: roa, URI symptoms   CC:  roa and URI symptoms.  History of Present Illness: follow up of chronic pain meds with methadone rx follow up labs and dicussion of lipids still smoking " ONCE IN A WHILE"  URI Symptoms      This is a 55 year old woman who presents with URI symptoms.  The patient reports nasal congestion and productive cough, but denies clear nasal discharge, purulent nasal discharge, sore throat, dry cough, earache, and sick contacts.  Associated symptoms include low-grade fever (<100.5 degrees).  The patient also reports itchy throat and headache.    URI Symptoms      The patient also reports severe fatigue.    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) 5)  Vaginitis, Bacterial, Recurrent  (ICD-616.10) 6)  Depression  (ICD-311) 7)  Cholecystectomy, Laparoscopic, Hx of  (ICD-V45.79) 8)  Hemorrhoidectomy, Hx of  (ICD-V45.89) 9)  Menopause, Surgical  (ICD-627.4) 10)  Hyperlipidemia  (ICD-272.4) 11)  Tubulovillous Adenoma, Colon  (ICD-211.3) 12)  Hemorrhoid, Thrombosed  (ICD-455.7) 13)  Nephrolithiasis  (ICD-592.0) 14)  Bicipital Tenosynovitis  (ICD-726.12) 15)  Tobacco Use  (ICD-305.1) 16)  Preventive Health Care  (ICD-V70.0) 17)  Symptom, Memory Loss  (ICD-780.93) 18)  Migraine, Classical w/o Intractable Migraine   (ICD-346.00) 19)  Fibromyalgia  (ICD-729.1) 20)  Back Pain, Chronic  (ICD-724.5)  Medications Prior to Update: 1)  Methadone Hcl 5 Mg  Tabs (Methadone Hcl) .... Take 1 Tablet By Mouth Tid 2)  Multivitamin .... Take 1 Tablet By Mouth Once A Day 3)  Celebrex 200 Mg  Caps (Celecoxib) .... Take 1 Tablet By Mouth Two Times A Day As Needed 4)  Baclofen 10 Mg  Tabs (Baclofen) .... As Needed 5)  Topamax 200 Mg  Tabs (Topiramate) .... Take 1 Tablet By Mouth Once A Day 6)  Darvocet-N 100   Tabs (Propoxyphene N-Apap Tabs) .... As Needed 7)  Elmiron 100 Mg  Caps (Pentosan Polysulfate Sodium) .... Three Times A Day 8)  Oxytrol 3.9 Mg/24hr  Pttw (Oxybutynin) .... Change 2 Times A Week 9)  Sanctura Xr 60 Mg  Cp24 (Trospium Chloride) .... Once Daily 10)  Vitamin D 66063 Unit  Caps (Ergocalciferol) .... One By Mouth Weekly 11)  Cyanocobalamin 1000 Mcg/ml Inj Soln (Cyanocobalamin) .Marland Kitchen.. 1 Ml Every Month 12)  Prozac 20 Mg Caps (Fluoxetine Hcl) .Marland Kitchen.. 1 Once Daily 13)  Trazodone Hcl 150 Mg Tabs (Trazodone Hcl) .Marland Kitchen.. 1 Once Daily 14)  Vivelle-Dot 0.05 Mg/24hr Pttw (Estradiol) .... Change Twice A Week 15)  Trimethoprim 100 Mg Tabs (Trimethoprim) .Marland Kitchen.. 1 Once Daily 16)  Azo Standard Maximum Strength 97.5 Mg Tabs (Phenazopyridine Hcl) .Marland KitchenMarland KitchenMarland Kitchen  As Needed  Current Medications (verified): 1)  Methadone Hcl 5 Mg  Tabs (Methadone Hcl) .... Take 1 Tablet By Mouth Tid 2)  Multivitamin .... Take 1 Tablet By Mouth Once A Day 3)  Celebrex 200 Mg  Caps (Celecoxib) .... Take 1 Tablet By Mouth Two Times A Day As Needed 4)  Baclofen 10 Mg  Tabs (Baclofen) .... As Needed 5)  Topamax 200 Mg  Tabs (Topiramate) .... Take 1 Tablet By Mouth Once A Day 6)  Darvocet-N 100   Tabs (Propoxyphene N-Apap Tabs) .... As Needed 7)  Elmiron 100 Mg  Caps (Pentosan Polysulfate Sodium) .... Three Times A Day 8)  Oxytrol 3.9 Mg/24hr  Pttw (Oxybutynin) .... Change 2 Times A Week 9)  Sanctura Xr 60 Mg  Cp24 (Trospium Chloride) .... Once Daily 10)   Vitamin D 81191 Unit  Caps (Ergocalciferol) .... One By Mouth Weekly 11)  Cyanocobalamin 1000 Mcg/ml Inj Soln (Cyanocobalamin) .Marland Kitchen.. 1 Ml Every Month 12)  Prozac 20 Mg Caps (Fluoxetine Hcl) .Marland Kitchen.. 1 Once Daily 13)  Trazodone Hcl 150 Mg Tabs (Trazodone Hcl) .Marland Kitchen.. 1 Once Daily 14)  Vivelle-Dot 0.05 Mg/24hr Pttw (Estradiol) .... Change Twice A Week 15)  Trimethoprim 100 Mg Tabs (Trimethoprim) .Marland Kitchen.. 1 Once Daily 16)  Azo Standard Maximum Strength 97.5 Mg Tabs (Phenazopyridine Hcl) .... As Needed 17)  Augmentin 875-125 Mg Tabs (Amoxicillin-Pot Clavulanate) .... One By Mouth Two Times A Day 18)  Bromfed Dm 30-2-10 Mg/66ml Syrp (Pseudoeph-Bromphen-Dm) .... Two Tsp By Mouth Q 6 Hours Prn  Allergies (verified): 1)  ! Vicodin  Past History:  Family History: Last updated: 04/21/2007 Fam hx MI Family History Hypertension Family History of Cardiovascular disorder Family History Other cancer-Colon  Social History: Last updated: 04/21/2007 Married Current Smoker Alcohol use-yes  Risk Factors: Smoking Status: current (11/18/2009) Packs/Day: 1 (11/18/2009) Passive Smoke Exposure: yes (11/18/2009)  Past medical, surgical, family and social histories (including risk factors) reviewed, and no changes noted (except as noted below).  Past Medical History: Reviewed history from 03/19/2008 and no changes required. fibromyalgia chronic back pain migraine HA Depression  Past Surgical History: Reviewed history from 02/17/2008 and no changes required. Lumbar Fusion [phone] Colonoscopy-11/18/2006 Carpal tunnel release Cholecystectomy-1995 Hemorrhoidectomy Hysterectomy-1984 Tonsillectomy-1964 multiple back surgery  Family History: Reviewed history from 04/21/2007 and no changes required. Fam hx MI Family History Hypertension Family History of Cardiovascular disorder Family History Other cancer-Colon  Social History: Reviewed history from 04/21/2007 and no changes  required. Married Current Smoker Alcohol use-yes  Review of Systems       The patient complains of decreased hearing, hoarseness, and prolonged cough.  The patient denies anorexia, fever, weight loss, weight gain, vision loss, chest pain, syncope, dyspnea on exertion, peripheral edema, headaches, hemoptysis, abdominal pain, melena, hematochezia, severe indigestion/heartburn, hematuria, incontinence, genital sores, muscle weakness, suspicious skin lesions, transient blindness, difficulty walking, depression, unusual weight change, abnormal bleeding, enlarged lymph nodes, angioedema, and breast masses.    Physical Exam  General:  alert, underweight appearing, and cachetic.   Head:  normocephalic.  atraumatic.   Eyes:  pupils equal and pupils round.  pupils reactive to light.   Nose:  mucosal edema and airflow obstruction.   Mouth:  posterior lymphoid hypertrophy and postnasal drip.   Lungs:  normal respiratory effort and no wheezes.   Heart:  normal rate and regular rhythm.   Abdomen:  soft, non-tender, and normal bowel sounds.     Impression & Recommendations:  Problem # 1:  FIBROMYALGIA (ICD-729.1) REFILL MEDS PER PROTOCOLS  Her updated medication list for this problem includes:    Methadone Hcl 5 Mg Tabs (Methadone hcl) .Marland Kitchen... Take 1 tablet by mouth tid    Celebrex 200 Mg Caps (Celecoxib) .Marland Kitchen... Take 1 tablet by mouth two times a day as needed    Baclofen 10 Mg Tabs (Baclofen) .Marland Kitchen... As needed    Darvocet-n 100 Tabs (Propoxyphene n-apap tabs) .Marland Kitchen... 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 1 once daily    Augmentin 875-125 Mg Tabs (Amoxicillin-pot clavulanate) ..... One by mouth two times a day    Bromfed Dm 30-2-10 Mg/33ml Syrp  (Pseudoeph-bromphen-dm) .Marland Kitchen..Marland Kitchen Two tsp by mouth q 6 hours prn  Instructed on treatment. Call if symptoms persist or worsen.   Problem # 4:  CHRONIC INTERSTITIAL CYSTITIS (ICD-595.1) Assessment: Unchanged ON PROPHLACTIC ANTIBIOTICS HOLD WHILE  Complete Medication List: 1)  Methadone Hcl 5 Mg Tabs (Methadone hcl) .... Take 1 tablet by mouth tid 2)  Multivitamin  .... Take 1 tablet by mouth once a day 3)  Celebrex 200 Mg Caps (Celecoxib) .... Take 1 tablet by mouth two times a day as needed 4)  Baclofen 10 Mg Tabs (Baclofen) .... As needed 5)  Topamax 200 Mg Tabs (Topiramate) .... Take 1 tablet by mouth once a day 6)  Darvocet-n 100 Tabs (Propoxyphene n-apap tabs) .... As needed 7)  Elmiron 100 Mg Caps (Pentosan polysulfate sodium) .... Three times a day 8)  Oxytrol 3.9 Mg/24hr Pttw (Oxybutynin) .... Change 2 times a week 9)  Sanctura Xr 60 Mg Cp24 (Trospium chloride) .... Once daily 10)  Vitamin D 16109 Unit Caps (Ergocalciferol) .... One by mouth weekly 11)  Cyanocobalamin 1000 Mcg/ml Inj Soln (Cyanocobalamin) .Marland Kitchen.. 1 ml every month 12)  Prozac 20 Mg Caps (Fluoxetine hcl) .Marland Kitchen.. 1 once daily 13)  Trazodone Hcl 150 Mg Tabs (Trazodone hcl) .Marland Kitchen.. 1 once daily 14)  Vivelle-dot 0.05 Mg/24hr Pttw (Estradiol) .... Change twice a week 15)  Trimethoprim 100 Mg Tabs (Trimethoprim) .Marland Kitchen.. 1 once daily 16)  Azo Standard Maximum Strength 97.5 Mg Tabs (Phenazopyridine hcl) .... As needed 17)  Augmentin 875-125 Mg Tabs (Amoxicillin-pot clavulanate) .... One by mouth two times a day 18)  Bromfed Dm 30-2-10 Mg/25ml Syrp (Pseudoeph-bromphen-dm) .... Two tsp by mouth q 6 hours prn  Patient Instructions: 1)  Please schedule a follow-up appointment in 3 months. Prescriptions: BROMFED DM 30-2-10 MG/5ML SYRP (PSEUDOEPH-BROMPHEN-DM) TWO TSP by mouth Q 6 HOURS PRN  #6 OZ x 0   Entered and Authorized by:   Stacie Glaze MD   Signed by:   Stacie Glaze MD on 11/18/2009   Method used:   Electronically to         [facility] (retail)       [address] [place], [place]  [address]       Ph: [phone]       Fax: [phone]   RxID:   [id number] AUGMENTIN 875-125 MG TABS (AMOXICILLIN-POT CLAVULANATE) ONE by mouth two times a day  #20 x 0   Entered and Authorized by:   Stacie Glaze MD   Signed by:   Stacie Glaze MD on 11/18/2009  Method used:   Electronically to        [facility] (retail)       [address]       [place], [place]  [address]       Ph: [phone]       Fax: [phone]   RxID:   [id number] METHADONE HCL 5 MG  TABS (METHADONE HCL) Take 1 tablet by mouth TID  #90 x 0   Entered by:   Willy Eddy, LPN   Authorized by:   Stacie Glaze MD   Signed by:   Willy Eddy, LPN on 18/84/1660   Method used:   Print then Give to Patient   RxID:   [id number] METHADONE HCL 5 MG  TABS (METHADONE HCL) Take 1 tablet by mouth TID  #90 x 0   Entered by:   Willy Eddy, LPN   Authorized by:   Stacie Glaze MD   Signed by:   Willy Eddy, LPN on 22/11/5425   Method used:   Print then Give to Patient   RxID:   [id number] METHADONE HCL 5 MG  TABS (METHADONE HCL) Take 1 tablet by mouth TID  #90 x 0   Entered by:   Willy Eddy, LPN   Authorized by:   Stacie Glaze MD   Signed by:   Willy Eddy, LPN on 07/37/1062   Method used:   Print then Give to Patient   RxID:   [id number]        Immunization History:  Tetanus/Td Immunization History:    Tetanus/Td:  historical (10/25/2006)

## 2010-11-24 NOTE — Progress Notes (Signed)
Summary: FLUOXETINE 40 MG  Phone Note Call from Patient Call back at Home Phone [phone]   Caller: Patient Call For: Stacie Glaze MD Reason for Call: Insurance Question Summary of Call: PT WENT TO [facility] FLUOXETINE 40 MG WAS NOT THERE PLEASE CALL [phone] AND HAVE THEM DELIVERY TO PT. Initial call taken by: Heron Sabins,  May 13, 2010 4:48 PM  Follow-up for Phone Call        called in and will deliver Follow-up by: Willy Eddy, LPN,  May 13, 2010 4:54 PM

## 2010-11-24 NOTE — Assessment & Plan Note (Signed)
Summary: 3 mo rov/mm/pt rsc from bmp/cjr rsc bmp/njr   Vital Signs:  Patient profile:   55 year old female Height:      62 inches Weight:      114 pounds BMI:     20.93 Temp:     98.23 degrees F oral Pulse rate:   72 / minute Resp:     14 per minute BP sitting:   130 / 80  (left arm)  Vitals Entered By: Willy Eddy, LPN (February 10, 2010 3:06 PM) CC: roa-wants to discuss chantix, needs replacement for darvocet n-needs vitamin d refill--labs?, Lipid Management   CC:  roa-wants to discuss chantix, needs replacement for darvocet n-needs vitamin d refill--labs?, and Lipid Management.  History of Present Illness: follow up medications and pain control increased stressors  the pt has been stable on her methadone and has breathough back pain due to her tens machine failing  Lipid Management History:      Positive NCEP/ATP III risk factors include female age 49 years old or older and current tobacco user.  Negative NCEP/ATP III risk factors include HDL cholesterol greater than 60, no family history for ischemic heart disease, non-hypertensive, no ASHD (atherosclerotic heart disease), no prior stroke/TIA, no peripheral vascular disease, and no history of aortic aneurysm.     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) 6)  Vaginitis, Bacterial, Recurrent  (ICD-616.10) 7)  Depression  (ICD-311) 8)  Cholecystectomy, Laparoscopic, Hx of  (ICD-V45.79) 9)  Hemorrhoidectomy, Hx of  (ICD-V45.89) 10)  Menopause, Surgical  (ICD-627.4) 11)  Hyperlipidemia  (ICD-272.4) 12)  Tubulovillous Adenoma, Colon  (ICD-211.3) 13)  Hemorrhoid,  Thrombosed  (ICD-455.7) 14)  Nephrolithiasis  (ICD-592.0) 15)  Bicipital Tenosynovitis  (ICD-726.12) 16)  Tobacco Use  (ICD-305.1) 17)  Preventive Health Care  (ICD-V70.0) 18)  Symptom, Memory Loss  (ICD-780.93) 19)  Migraine, Classical w/o Intractable Migraine  (ICD-346.00) 20)  Fibromyalgia  (ICD-729.1) 21)  Back Pain, Chronic  (ICD-724.5)  Current Problems (verified): 1)  Acute Maxillary Sinusitis  (ICD-461.0) 2)  Leg Cramps  (ICD-729.82) 3)  Restless Leg Syndrome, Severe  (ICD-333.94) 4)  Chronic Interstitial Cystitis  (ICD-595.1) 5)  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) 6)  Vaginitis, Bacterial, Recurrent  (ICD-616.10) 7)  Depression  (ICD-311) 8)  Cholecystectomy, Laparoscopic, Hx of  (ICD-V45.79) 9)  Hemorrhoidectomy, Hx of  (ICD-V45.89) 10)  Menopause, Surgical  (ICD-627.4) 11)  Hyperlipidemia  (ICD-272.4) 12)  Tubulovillous Adenoma, Colon  (ICD-211.3) 13)  Hemorrhoid, Thrombosed  (ICD-455.7) 14)  Nephrolithiasis  (ICD-592.0) 15)  Bicipital Tenosynovitis  (ICD-726.12) 16)  Tobacco Use  (ICD-305.1) 17)  Preventive Health Care  (ICD-V70.0) 18)  Symptom, Memory Loss  (ICD-780.93) 19)  Migraine, Classical w/o Intractable Migraine  (ICD-346.00) 20)  Fibromyalgia  (ICD-729.1) 21)  Back Pain, Chronic  (ICD-724.5)  Medications Prior to Update: 1)  Methadone Hcl 5 Mg  Tabs (Methadone Hcl) .... Take 1 Tablet By Mouth Tid 2)  Multivitamin .... Take 1 Tablet By Mouth Once A Day 3)  Celebrex 200 Mg  Caps (Celecoxib) .... Take 1 Tablet By Mouth Two  Times A Day As Needed 4)  Baclofen 10 Mg  Tabs (Baclofen) .... As Needed 5)  Topamax 200 Mg  Tabs (Topiramate) .... Take 1 Tablet By Mouth Once A Day 6)  Darvocet-N 100   Tabs (Propoxyphene N-Apap Tabs) .... As Needed 7)  Elmiron 100 Mg  Caps (Pentosan Polysulfate Sodium) .... Three Times A Day 8)  Oxytrol 3.9 Mg/24hr  Pttw (Oxybutynin) .... Change 2 Times A Week 9)  Sanctura Xr 60 Mg  Cp24 (Trospium Chloride)  .... Once Daily 10)  Vitamin D 16109 Unit  Caps (Ergocalciferol) .... One By Mouth Weekly 11)  Cyanocobalamin 1000 Mcg/ml Inj Soln (Cyanocobalamin) .Marland Kitchen.. 1 Ml Every Month 12)  Prozac 20 Mg Caps (Fluoxetine Hcl) .Marland Kitchen.. 1 Once Daily 13)  Trazodone Hcl 150 Mg Tabs (Trazodone Hcl) .Marland Kitchen.. 1 Once Daily 14)  Vivelle-Dot 0.05 Mg/24hr Pttw (Estradiol) .... Change Twice A Week 15)  Trimethoprim 100 Mg Tabs (Trimethoprim) .Marland Kitchen.. 1 Once Daily 16)  Azo Standard Maximum Strength 97.5 Mg Tabs (Phenazopyridine Hcl) .... As Needed 17)  Bromfed Dm 30-2-10 Mg/51ml Syrp (Pseudoeph-Bromphen-Dm) .... Two Tsp By Mouth Q 6 Hours Prn  Current Medications (verified): 1)  Methadone Hcl 5 Mg  Tabs (Methadone Hcl) .... Take 1 Tablet By Mouth Tid 2)  Multivitamin .... Take 1 Tablet By Mouth Once A Day 3)  Celebrex 200 Mg  Caps (Celecoxib) .... Take 1 Tablet By Mouth Two Times A Day As Needed 4)  Baclofen 10 Mg  Tabs (Baclofen) .... As Needed 5)  Topamax 200 Mg  Tabs (Topiramate) .... Take 1 Tablet By Mouth Once A Day 6)  Elmiron 100 Mg  Caps (Pentosan Polysulfate Sodium) .... Three Times A Day 7)  Oxytrol 3.9 Mg/24hr  Pttw (Oxybutynin) .... Change 2 Times A Week 8)  Sanctura Xr 60 Mg  Cp24 (Trospium Chloride) .... Once Daily 9)  Vitamin D 60454 Unit  Caps (Ergocalciferol) .... One By Mouth Weekly 10)  Cyanocobalamin 1000 Mcg/ml Inj Soln (Cyanocobalamin) .Marland Kitchen.. 1 Ml Every Month 11)  Prozac 20 Mg Caps (Fluoxetine Hcl) .Marland Kitchen.. 1 Once Daily 12)  Trazodone Hcl 150 Mg Tabs (Trazodone Hcl) .Marland Kitchen.. 1 Once Daily 13)  Vivelle-Dot 0.05 Mg/24hr Pttw (Estradiol) .... Change Twice A Week 14)  Trimethoprim 100 Mg Tabs (Trimethoprim) .Marland Kitchen.. 1 Once Daily 15)  Azo Standard Maximum Strength 97.5 Mg Tabs (Phenazopyridine Hcl) .... As Needed 16)  Bromfed Dm 30-2-10 Mg/82ml Syrp (Pseudoeph-Bromphen-Dm) .... Two Tsp By Mouth Q 6 Hours Prn  Allergies (verified): 1)  ! Vicodin  Past History:  Family History: Last updated: 04/21/2007 Fam hx MI Family  History Hypertension Family History of Cardiovascular disorder Family History Other cancer-Colon  Social History: Last updated: 04/21/2007 Married Current Smoker Alcohol use-yes  Risk Factors: Smoking Status: current (02/10/2010) Packs/Day: 1 (02/10/2010) Passive Smoke Exposure: yes (02/10/2010)  Past medical, surgical, family and social histories (including risk factors) reviewed, and no changes noted (except as noted below).  Past Medical History: Reviewed history from 03/19/2008 and no changes required. fibromyalgia chronic back pain migraine HA Depression  Past Surgical History: Reviewed history from 02/17/2008 and no changes required. Lumbar Fusion [phone] Colonoscopy-11/18/2006 Carpal tunnel release Cholecystectomy-1995 Hemorrhoidectomy Hysterectomy-1984 Tonsillectomy-1964 multiple back surgery  Family History: Reviewed history from 04/21/2007 and no changes required. Fam hx MI Family History Hypertension Family History of Cardiovascular disorder Family History Other cancer-Colon  Social History: Reviewed history from 04/21/2007 and no changes required. Married Current Smoker Alcohol use-yes  Review of Systems  The patient denies anorexia, fever, weight  loss, weight gain, vision loss, decreased hearing, hoarseness, chest pain, syncope, dyspnea on exertion, peripheral edema, prolonged cough, headaches, hemoptysis, abdominal pain, melena, hematochezia, severe indigestion/heartburn, hematuria, incontinence, genital sores, muscle weakness, suspicious skin lesions, transient blindness, difficulty walking, depression, unusual weight change, abnormal bleeding, enlarged lymph nodes, angioedema, and breast masses.    Physical Exam  General:  alert, underweight appearing, and cachetic.   Head:  normocephalic.  atraumatic.   Eyes:  pupils equal and pupils round.  pupils reactive to light.   Nose:  mucosal edema and airflow obstruction.   Mouth:  posterior  lymphoid hypertrophy and postnasal drip.   Neck:  No deformities, masses, or tenderness noted. Lungs:  normal respiratory effort and no wheezes.   Heart:  normal rate and regular rhythm.   Abdomen:  soft, non-tender, and normal bowel sounds.   Msk:  No deformity or scoliosis noted of thoracic or lumbar spine.   Neurologic:  alert & oriented X3 and gait normal.     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-296.34) due to the increased stress this has caused some paranoid thoughts  Problem # 2:  CHRONIC INTERSTITIAL CYSTITIS (ICD-595.1) on medications  Problem # 3:  HYPERLIPIDEMIA (ICD-272.4)  monitering labs Labs Reviewed: SGOT: 12 (03/19/2008)   SGPT: 12 (03/19/2008)  Lipid Goals: Chol Goal: 200 (02/10/2010)   HDL Goal: 40 (02/10/2010)   LDL Goal: 160 (02/10/2010)   TG Goal: 150 (02/10/2010)  Prior 10 Yr Risk Heart Disease: Not enough information (03/14/2009)   HDL:80.20 (08/18/2009), 59.9 (03/19/2008)  LDL:DEL (03/19/2008), DEL (03/09/2007)  Chol:257 (08/18/2009), 252 (03/19/2008)  Trig:54 (03/19/2008), 81 (03/09/2007)  Orders: Venipuncture (16109) TLB-Cholesterol, HDL (83718-HDL) TLB-Cholesterol, Direct LDL (83721-DIRLDL) TLB-Cholesterol, Total (82465-CHO)  Complete Medication List: 1)  Methadone Hcl 5 Mg Tabs (Methadone hcl) .... Take 1 tablet by mouth tid 2)  Multivitamin  .... Take 1 tablet by mouth once a day 3)  Celebrex 200 Mg Caps (Celecoxib) .... Take 1 tablet by mouth two times a day as needed 4)  Baclofen 10 Mg Tabs (Baclofen) .... As needed 5)  Topamax 200 Mg Tabs (Topiramate) .... Take 1 tablet by mouth once a day 6)  Elmiron 100 Mg Caps (Pentosan polysulfate sodium) .... Three times a day 7)  Oxytrol 3.9 Mg/24hr Pttw (Oxybutynin) .... Change 2 times a week 8)  Sanctura Xr 60 Mg Cp24 (Trospium chloride) .... Once daily 9)  Vitamin D 60454 Unit Caps (Ergocalciferol) .... One by mouth weekly 10)   Cyanocobalamin 1000 Mcg/ml Inj Soln (Cyanocobalamin) .Marland Kitchen.. 1 ml every month 11)  Prozac 20 Mg Caps (Fluoxetine hcl) .Marland Kitchen.. 1 once daily 12)  Trazodone Hcl 150 Mg Tabs (Trazodone hcl) .Marland Kitchen.. 1 once daily 13)  Vivelle-dot 0.05 Mg/24hr Pttw (Estradiol) .... Change twice a week 14)  Trimethoprim 100 Mg Tabs (Trimethoprim) .Marland Kitchen.. 1 once daily 15)  Azo Standard Maximum Strength 97.5 Mg Tabs (Phenazopyridine hcl) .... As needed 16)  Bromfed Dm 30-2-10 Mg/40ml Syrp (Pseudoeph-bromphen-dm) .... Two tsp by mouth q 6 hours prn 17)  Oxycodone Hcl 5 Mg Tabs (Oxycodone hcl) .... One by mouth q 6 hour for break through  Other Orders: TLB-CBC Platelet - w/Differential (85025-CBCD)  Lipid Assessment/Plan:      Based on NCEP/ATP III, the patient's risk factor category is "0-1 risk factors".  The patient's lipid goals are as follows: Total cholesterol goal is 200; LDL cholesterol goal is 160; HDL cholesterol goal is 40; Triglyceride goal is  150.  Her LDL cholesterol goal has not been met.  Secondary causes for hyperlipidemia have been ruled out.  She has been counseled on adjunctive measures for lowering her cholesterol and has been provided with dietary instructions.    Patient Instructions: 1)  Please schedule a follow-up appointment in 3 months. Prescriptions: OXYCODONE HCL 5 MG TABS (OXYCODONE HCL) one by mouth q 6 hour for break through  #60 x 0   Entered and Authorized by:   Stacie Glaze MD   Signed by:   Stacie Glaze MD on 02/10/2010   Method used:   Print then Give to Patient   RxID:   [id number] METHADONE HCL 5 MG  TABS (METHADONE HCL) Take 1 tablet by mouth TID  #90 x 0   Entered and Authorized by:   Stacie Glaze MD   Signed by:   Stacie Glaze MD on 02/10/2010   Method used:   Print then Give to Patient   RxID:   [id number] METHADONE HCL 5 MG  TABS (METHADONE HCL) Take 1 tablet by mouth TID  #90 x 0   Entered and Authorized by:   Stacie Glaze MD   Signed by:   Stacie Glaze MD on  02/10/2010   Method used:   Print then Give to Patient   RxID:   [id number] METHADONE HCL 5 MG  TABS (METHADONE HCL) Take 1 tablet by mouth TID  #90 x 0   Entered and Authorized by:   Stacie Glaze MD   Signed by:   Stacie Glaze MD on 02/10/2010   Method used:   Print then Give to Patient   RxID:   [id number] CYANOCOBALAMIN 1000 MCG/ML INJ SOLN (CYANOCOBALAMIN) 1 ml every month  #1vial x 1   Entered by:   Willy Eddy, LPN   Authorized by:   Stacie Glaze MD   Signed by:   Willy Eddy, LPN on 17/61/6073   Method used:   Electronically to        [facility] (retail)       [address]       [place], [place]  [address]       Ph: [phone]       Fax: [phone]   RxID:   [id number]

## 2011-01-20 ENCOUNTER — Telehealth: Payer: Self-pay | Admitting: Internal Medicine

## 2011-01-20 ENCOUNTER — Other Ambulatory Visit: Payer: Self-pay | Admitting: *Deleted

## 2011-01-20 DIAGNOSIS — M797 Fibromyalgia: Secondary | ICD-10-CM

## 2011-01-20 MED ORDER — METHADONE HCL 5 MG PO TABS: 5.0000 mg | ORAL_TABLET | Freq: Three times a day (TID) | ORAL | Status: DC

## 2011-01-20 NOTE — Telephone Encounter (Signed)
Tell pt she can pick up 2 m onths of methadone and she needs to make office visit before any more refills

## 2011-01-20 NOTE — Telephone Encounter (Signed)
Please tell pt she can pick up 2 months of methadone tomorrow, but she will have to make ov before anymore refills

## 2011-01-20 NOTE — Telephone Encounter (Signed)
Pt called and is req script for Methydone 5 mg tid. Pt said she probably needs to sch ov with Dr Lovell Sheehan, in order to get refill. Pls advise as to when pt can come in soon?

## 2011-01-21 NOTE — Telephone Encounter (Signed)
Pt has been notified that scripts are ready for pick up and ov needed before more refills.

## 2011-01-22 ENCOUNTER — Other Ambulatory Visit: Payer: Self-pay | Admitting: *Deleted

## 2011-01-22 DIAGNOSIS — M797 Fibromyalgia: Secondary | ICD-10-CM

## 2011-01-22 MED ORDER — METHADONE HCL 5 MG PO TABS: 5.0000 mg | ORAL_TABLET | Freq: Three times a day (TID) | ORAL | Status: DC

## 2011-02-04 LAB — POCT HEMOGLOBIN-HEMACUE: Hemoglobin: 12 g/dL (ref 12.0–15.0)
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
## 2011-03-09 NOTE — Op Note (Signed)
Alison Brooks, Alison Brooks              ACCOUNT NO.:  [id number]   MEDICAL RECORD NO.:  [id number]          PATIENT TYPE:  AMB   LOCATION:  [facility]                         FACILITY:  [facility]   PHYSICIAN:  Sigmund I. Patsi Sears, M.D.DATE OF BIRTH:  29-Jun-1956   DATE OF PROCEDURE:  12/27/2008  DATE OF DISCHARGE:                               OPERATIVE REPORT   PREOPERATIVE DIAGNOSIS:  Interstitial cystitis.   POSTOPERATIVE DIAGNOSIS:  Interstitial cystitis.   PROCEDURE:  1. Cystourethroscopy.  2. Botox injection.   SURGEONS:  Sigmund I. Patsi Sears, M.D.  Dr. Unk Pinto.   ANESTHESIA:  General.   COMPLICATIONS:  None.   ESTIMATED BLOOD LOSS:  Minimal.   INDICATION FOR PROCEDURE:  The patient is a 55 year old female with  known history of interstitial cystitis on Elmiron and Sanctura.  Patient  was recently evaluated in the clinic.  Patient, in the past, has had  cystoscopy and Botox injection with some relief.  Patient requesting  repeat procedure.  Risks and benefits of the procedure explained and  informed consent obtained.   DESCRIPTION OF PROCEDURE IN DETAIL:  Patient was brought to the  operating room, placed in supine position, administered general  anesthesia by the anesthesia team.  Proper timeout performed confirming  correct patient, procedure, and the site.  Patient was given appropriate  preoperative  antibiotic.  Patient was subsequently placed in dorsal  lithotomy position.  Pressure points were well padded; bilateral lower  extremity SCDs were applied.  Patient was prepped and draped in usual  sterile manner.  We then using a 30-degree lens and an ACMI sheath with  a needle port we performed cystourethroscopy.  Patient's urethra was  normal.  Upon entering the bladder, right and left ureteral orifices  were found in normal anatomic position.  Cystourethroscopy did not  reveal any mass, lesion or stone.  We then submucosally injected  multiple areas in the  bladder, starting at the trigone area, extending  up to the posterior and lateral bladder wall bilaterally.  Botox 1 cc  was injected submucosally with formation of good submucosal blebs all  around.  We injected total of 21 cc.  We then at the end looked for  hemostasis.  No active bleeding was seen.  We then drained patient's  bladder.  This marked end of procedure.  Patient was subsequently  extubated and transferred in stable condition to recovery room.   Note that Dr. Patsi Sears was present and available for all the aspects  of the case.  Signed 01/16/09 @ 4:00pm     ______________________________  Alison Brooks      Sigmund I. Patsi Sears, M.D.  Electronically Signed    JJ/MEDQ  D:  12/27/2008  T:  12/27/2008  Job:  [id number]

## 2011-03-09 NOTE — Assessment & Plan Note (Signed)
[facility]                         GASTROENTEROLOGY OFFICE NOTE   DEBAR, PLATE                     MRN:          [id number]  DATE:04/12/2007                            DOB:          06-23-1956    REASON FOR CONSULTATION:  Rectal pain.   HISTORY:  This is a 55 year old female with a history of hyperlipidemia,  anxiety, and adenomatous colon polyps who presents today regarding  rectal pain.  She contacted the office this morning with a 3-day history  of right-sided rectal discomfort.  She was worked in to the office  schedule on an urgent basis.  She initially underwent colonoscopy for a  family history of colon cancer in January of 2007.  She was found to  have a large sessile lesion in the region of the hepatic flexure, which  was removed.  She followed up in 1 year with minimal residual adenoma  only.  Followup again in 1 year recommended.  Her bowel habits have been  regular.  There has been no bleeding.  No fevers.  Over 3 days she has  noted pain on the right side, worse with defecation.  No abdominal pain  or other symptoms.   PAST MEDICAL HISTORY:  As above.  In addition, kidney stones.   PAST SURGICAL HISTORY:  1. Cholecystectomy.  2. Hemorrhoidectomy.  3. Hysterectomy.   FAMILY HISTORY:  Mother with colon cancer at age 75.  Otherwise, per  diagnostic evaluation form.   SOCIAL HISTORY:  Patient is married with 1 daughter.  She attended  college.  She is a retired Engineer, civil (consulting).  She smokes.  Occasionally uses  alcohol.   REVIEW OF SYSTEMS:  Per diagnostic evaluation form.   PHYSICAL EXAMINATION:  Well-appearing female in no acute distress.  Blood pressure is 122/80.  Heart rate is 72.  Weight is 116 pounds.  She  is 5 feet 2-1/2 inches in height.  ABDOMEN:  Benign.  RECTAL:  No external abnormalities.  Tenderness to palpation with slight  fullness on the right.  Stool is Hemoccult negative.  ANOSCOPY:  Revealed bluish thrombosed  hemorrhoid on the right lateral  wall.  Several other middle hemorrhoids not inflamed.   IMPRESSION:  Rectal pain due to thrombosed hemorrhoid.   RECOMMENDATIONS:  1. Sitz baths b.i.d.  2. AnaMantle cream b.i.d.  3. Metamucil 2 tablespoons daily.  4. Follow up as needed for worsening or persistent problems.     Wilhemina Bonito. Marina Goodell, MD  Electronically Signed    JNP/MedQ  DD: 04/12/2007  DT: 04/12/2007  Job #: [id number]

## 2011-03-12 NOTE — Op Note (Signed)
Alison Brooks, Alison Brooks              ACCOUNT NO.:  [id number]   MEDICAL RECORD NO.:  [id number]          PATIENT TYPE:  AMB   LOCATION:  [facility]                         FACILITY:  [facility]   PHYSICIAN:  Sigmund I. Patsi Sears, M.D.DATE OF BIRTH:  1956-07-16   DATE OF PROCEDURE:  04/01/2006  DATE OF DISCHARGE:                                 OPERATIVE REPORT   PREOPERATIVE DIAGNOSIS:  Interstitial cystitis with severe refractory  sensory urgency.   POSTOPERATIVE DIAGNOSIS:  Interstitial cystitis with severe refractory  sensory urgency.   OPERATION:  Cystourethroscopy, Botox injection.   SURGEON:  Sigmund I. Patsi Sears, M.D.   ANESTHESIA:  General LMA.   PREPARATION:  Appropriate preanesthesia, the patient is brought to the  operating room and placed on the operating room table in the dorsal supine  position where general LMA anesthesia was introduced.  She was then replaced  in the dorsal lithotomy position where the pubis was prepped with Betadine  solution and draped in the usual fashion.   REVIEW OF HISTORY:  This 55 year old female has interstitial cystitis and  severe sensory urgency, refractory to anticholinergic therapy.  She is now  for Botox injection.  She does not do self-intermittent catheterize but  prefers to have low-dose Botox to try to avoid having to cath.  Her  medications have included Elmiron 100 mg three times a day.  She is status  post urodynamics showing an unstable bladder with severe sensory urgency.   DESCRIPTION OF PROCEDURE:  The cystourethroscopy was accomplished and a  normal appearing bladder is identified.  Ten injection of Botox were  accomplished, with 3 international units of Botox in 10 mL of normal saline.  The final two injections were in the trigone.  The patient tolerated the  injections well.  She was given a BNO suppository at the beginning of the  procedure and IV Toradol at the end of the procedure.  She is awakened and  taken to the  recovery room in good condition.      Sigmund I. Patsi Sears, M.D.  Electronically Signed     SIT/MEDQ  D:  04/01/2006  T:  04/02/2006  Job:  [id number]

## 2011-03-12 NOTE — Op Note (Signed)
NAMEANDEE, CHIVERS                        ACCOUNT NO.:  [id number]   MEDICAL RECORD NO.:  [id number]                   PATIENT TYPE:  AMB   LOCATION:  DAY                                  FACILITY:  [facility]   PHYSICIAN:  Barbaraann Barthel, M.D.              DATE OF BIRTH:  [birth year]   DATE OF PROCEDURE:  01/03/2004  DATE OF DISCHARGE:                                 OPERATIVE REPORT   SURGEON:  Barbaraann Barthel, M.D.   PREOPERATIVE DIAGNOSIS:  Internal hemorrhoids, prolapsed.   POSTOPERATIVE DIAGNOSIS:  Internal hemorrhoids, prolapsed.   PROCEDURE:  1. Rigid proctoscopy.  2. Internal hemorrhoidectomy.   SPECIMENS:  Internal hemorrhoids from right posterior bundle.   INDICATIONS FOR PROCEDURE:  This is a 55 year old white female who had  complaints of perianal discomfort with internal hemorrhoids that were  prolapsed and causing her considerable problems.  We discussed repair,  discussing complications, not limited to but including bleeding and  infection.  Informed consent was obtained.   TECHNIQUE:  The patient was placed in the lithotomy position.  This was done  very carefully, as this patient has considerable back problems, and this was  done with close coordination with anesthesia evaluating her preanesthesia.   We then performed digital examination and a rigid proctoscopy to 20 cm.  No  abnormalities were encountered.  After prepping the perianal area and  draping in the usual manner and after endotracheal intubation, an internal  hemorrhoidectomy was performed, removing the right prolapsed posterior  bundle.  This was done in open fashion, removing these around a hemorrhoidal  clamp and then oversewing this with 3-0 gut suture.  We checked for  hemostasis.  This was deemed completed, and then I added 0.5% Sensorcaine  perianal block to help with postoperative discomfort.  A sterile dressing  with viscous Xylocaine was applied.  Prior to closure, all sponge, needle,  and instrument counts were found to be correct.  Estimated blood loss was  minimal.   The patient tolerated the procedure well and was taken to the recovery room  in satisfactory condition.      ___________________________________________                                            Barbaraann Barthel, M.D.   WB/MEDQ  D:  01/03/2004  T:  01/03/2004  Job:  [id number]

## 2011-03-12 NOTE — Op Note (Signed)
NAMEJACKSON, FETTERS              ACCOUNT NO.:  [id number]   MEDICAL RECORD NO.:  [id number]          PATIENT TYPE:  AMB   LOCATION:  DAY                          FACILITY:  [facility]   PHYSICIAN:  Sigmund I. Patsi Sears, M.D.DATE OF BIRTH:  December 28, 1955   DATE OF PROCEDURE:  03/04/2006  DATE OF DISCHARGE:                                 OPERATIVE REPORT   PREOPERATIVE DIAGNOSIS:  Interstitial cystitis.   POSTOPERATIVE DIAGNOSIS:  Interstitial cystitis.   OPERATION:  Cystourethroscopy, hydrodistention bladder (450 mL),  installation of Clorpactin x3 minutes, bladder irrigation, and installation  of Pyridium Marcaine solution in the bladder, injection of Marcaine Kenalog  solution at the bladder base.   SURGEON:  Sigmund I. Patsi Sears, M.D.   ANESTHESIA:  General LMA.   PREPARATION:  After appropriate preanesthesia, the patient is brought to the  operating room, placed on the operating room table in the dorsal supine  position where general LMA anesthesia was induced.  She was then replaced in  the dorsal lithotomy position.  The pubis was prepped with Betadine solution  and draped in the usual fashion.Marland Kitchen   PROCEDURE:  Cystourethroscopy was accomplished which showed some bladder  hemorrhages prior to hydrodistention.  The bladder held 450 mL.  It was  hydrodistended but still only held 450 mL, indicating thickness of the  bladder wall.   Clorpactin was inserted and left in place for 3 minutes (250 mL), and then  irrigated free from bladder with saline.  Following this, Pyridium and  Marcaine was inserted bladder and left in position.  The patient then  underwent injection of Marcaine and Kenalog at the bladder base.  A BNO  suppository was given at the beginning of the case and Toradol was given at  the end of the case.  The patient was awakened and taken to recovery room in  good condition.      Sigmund I. Patsi Sears, M.D.  Electronically Signed     SIT/MEDQ  D:  03/04/2006   T:  03/05/2006  Job:  [id number]

## 2011-03-12 NOTE — Op Note (Signed)
NAME:  Alison Brooks, FALK NO.:  [id number]   MEDICAL RECORD NO.:  [id number]                  PATIENT TYPE:   LOCATION:                                       FACILITY:   PHYSICIAN:  Barbaraann Barthel, M.D.              DATE OF BIRTH:   DATE OF PROCEDURE:  02/05/2004  DATE OF DISCHARGE:                                 OPERATIVE REPORT   PREOPERATIVE DIAGNOSIS:  Ganglion cyst anterior right wrist.   POSTOPERATIVE DIAGNOSIS:  Ganglion cyst anterior right wrist.   PROCEDURE:   SURGEON:  Barbaraann Barthel, M.D.   SPECIMEN:  Ganglion cyst.   NOTE:  This is a 55 year old, white female who had ganglion cyst,  clinically, in her right wrist for quite some time.  This was causing her  increased discomfort.  She has many arthritic complaints regarding back  problems as well.  We scheduled the surgery when it was convenient for her  and her other problems.   We discussed complications, not limited to, but including:  Bleeding,  infection, and recurrent.  Informed consent was obtained.   GROSS OPERATIVE FINDINGS:  Those consistent with a ganglion cyst on the  radial aspect of the anterior wrist.  This was adhered to the flexor  retinaculum.   TECHNIQUE:  The patient was placed in the supine position, after the  adequate administration of a Bier block anesthesia after prepping and  draping and using the tourniquet that was inflated to 250 mmHg.  A  longitudinal incision was carried out over the palpated mass.  This was  dissected down to the flexor retinaculum and dissected and removed.   Its insertion site was cauterized with a needle-tip Bovie device. After  checking for hemostasis and irrigating we approximated the skin with a 5-0  Nylon and a sterile dressing with Neosporin ointment was applied.  I used a  cock-up medium splint as well after dressing.  Prior to closure, all sponge,  needle, and instrument counts were found to be correct.  Estimated  blood  loss was minimal.  There were no complications.      ___________________________________________                                            Barbaraann Barthel, M.D.   WB/MEDQ  D:  02/05/2004  T:  02/05/2004  Job:  [id number]   cc:   Barbaraann Barthel, M.D.  [address]  [place]  [place] [address]  Fax: [phone]

## 2011-04-21 ENCOUNTER — Telehealth: Payer: Self-pay | Admitting: *Deleted

## 2011-04-21 MED ORDER — FLUCONAZOLE 150 MG PO TABS: 150.0000 mg | ORAL_TABLET | Freq: Once | ORAL | Status: AC

## 2011-04-21 NOTE — Telephone Encounter (Signed)
Pt states she has yeast infection and OTC cream not working.  Wants Diflucan called to [facility] .

## 2011-04-21 NOTE — Telephone Encounter (Signed)
Per dr Lovell Sheehan may have diflucan 150 now and repeat in 5 days per dr Lovell Sheehan

## 2011-05-18 ENCOUNTER — Ambulatory Visit (INDEPENDENT_AMBULATORY_CARE_PROVIDER_SITE_OTHER): Payer: 59 | Admitting: Licensed Clinical Social Worker

## 2011-05-18 DIAGNOSIS — F331 Major depressive disorder, recurrent, moderate: Secondary | ICD-10-CM

## 2011-05-18 DIAGNOSIS — F411 Generalized anxiety disorder: Secondary | ICD-10-CM

## 2011-05-19 ENCOUNTER — Encounter: Payer: Self-pay | Admitting: Internal Medicine

## 2011-05-24 ENCOUNTER — Ambulatory Visit (INDEPENDENT_AMBULATORY_CARE_PROVIDER_SITE_OTHER): Payer: Medicare Other | Admitting: Internal Medicine

## 2011-05-24 ENCOUNTER — Encounter: Payer: Self-pay | Admitting: Internal Medicine

## 2011-05-24 VITALS — BP 110/70 | HR 72 | Temp 98.2°F | Resp 16 | Ht 62.0 in | Wt 140.0 lb

## 2011-05-24 DIAGNOSIS — M549 Dorsalgia, unspecified: Secondary | ICD-10-CM

## 2011-05-24 DIAGNOSIS — D126 Benign neoplasm of colon, unspecified: Secondary | ICD-10-CM

## 2011-05-24 DIAGNOSIS — E538 Deficiency of other specified B group vitamins: Secondary | ICD-10-CM

## 2011-05-24 DIAGNOSIS — M797 Fibromyalgia: Secondary | ICD-10-CM

## 2011-05-24 DIAGNOSIS — G5790 Unspecified mononeuropathy of unspecified lower limb: Secondary | ICD-10-CM

## 2011-05-24 DIAGNOSIS — IMO0001 Reserved for inherently not codable concepts without codable children: Secondary | ICD-10-CM

## 2011-05-24 DIAGNOSIS — W108XXA Fall (on) (from) other stairs and steps, initial encounter: Secondary | ICD-10-CM | POA: Insufficient documentation

## 2011-05-24 LAB — CBC WITH DIFFERENTIAL/PLATELET
Basophils Absolute: 0.1 10*3/uL (ref 0.0–0.1)
Eosinophils Absolute: 0.1 10*3/uL (ref 0.0–0.7)
Lymphocytes Relative: 29.1 % (ref 12.0–46.0)
MCHC: 33.5 g/dL (ref 30.0–36.0)
MCV: 95.9 fl (ref 78.0–100.0)
Monocytes Absolute: 0.5 10*3/uL (ref 0.1–1.0)
Neutro Abs: 4.3 10*3/uL (ref 1.4–7.7)
Neutrophils Relative %: 62.1 % (ref 43.0–77.0)
RDW: 14.6 % (ref 11.5–14.6)

## 2011-05-24 MED ORDER — CYANOCOBALAMIN 1000 MCG/ML IJ SOLN: 1000.0000 ug | INTRAMUSCULAR | Status: DC

## 2011-05-24 MED ORDER — METHADONE HCL 5 MG PO TABS: 5.0000 mg | ORAL_TABLET | Freq: Three times a day (TID) | ORAL | Status: DC

## 2011-05-24 NOTE — Progress Notes (Signed)
  Subjective:    Patient ID: Alison Brooks, female    DOB: 1955-11-04, 55 y.o.   MRN: [id number]  HPI The patient reports a history of falls they tend to be towards the right side she has caused minor injuries of bruising and scrapes and abrasions but no significant injury such as a fracture or loss of consciousness these occur her parietes of situations entering without a predictable pattern she reports no headache no dizziness no associated symptoms with these falls Patient has a history of B12 deficiency and neuropathy she has not been taking her B12 shots for overnight day   Review of Systems  Constitutional: Negative for activity change, appetite change and fatigue.  HENT: Negative for ear pain, congestion, neck pain, postnasal drip and sinus pressure.   Eyes: Negative for redness and visual disturbance.  Respiratory: Negative for cough, shortness of breath and wheezing.   Gastrointestinal: Negative for abdominal pain and abdominal distention.  Genitourinary: Negative for dysuria, frequency and menstrual problem.  Musculoskeletal: Negative for myalgias, joint swelling and arthralgias.  Skin: Negative for rash and wound.  Neurological: Negative for dizziness, weakness and headaches.  Hematological: Negative for adenopathy. Does not bruise/bleed easily.  Psychiatric/Behavioral: Negative for sleep disturbance and decreased concentration.       Objective:   Physical Exam  Constitutional: She is oriented to person, place, and time. She appears well-developed and well-nourished. No distress.  HENT:  Head: Normocephalic and atraumatic.  Right Ear: External ear normal.  Left Ear: External ear normal.  Nose: Nose normal.  Mouth/Throat: Oropharynx is clear and moist.  Eyes: Conjunctivae and EOM are normal. Pupils are equal, round, and reactive to light.       No nystagmus seen  Neck: Normal range of motion. Neck supple. No JVD present. No tracheal deviation present. No thyromegaly  present.  Cardiovascular: Normal rate, regular rhythm, normal heart sounds and intact distal pulses.   No murmur heard. Pulmonary/Chest: Effort normal and breath sounds normal. She has no wheezes. She exhibits no tenderness.  Abdominal: Soft. Bowel sounds are normal.  Musculoskeletal: Normal range of motion. She exhibits no edema and no tenderness.       Monitored walk there was no foot drag or drift  Lymphadenopathy:    She has no cervical adenopathy.  Neurological: She is alert and oriented to person, place, and time. She has normal reflexes. No cranial nerve deficit.  Skin: Skin is warm and dry. She is not diaphoretic.  Psychiatric: She has a normal mood and affect. Her behavior is normal.    Mild right neuropathy       Assessment & Plan:  Physical examination I do not detect focal neurological deficits she has no nystagmus no weakness of grip no gait disorder she does have detectable numbness in her toes bilaterally to both pinprick and soft touch.  I suspect that she is worsening B12 deficiency and neuropathy related both to the B12 deficiency and to her back injury.  We'll begin B12 injections weekly for one month and resume the 30 day B12 injections and monitor her progress.  Her pain medicines were filled today blood pressure is stable she'll follow up in 3 month No problem-specific assessment & plan notes found for this encounter.

## 2011-06-01 ENCOUNTER — Ambulatory Visit: Payer: 59 | Admitting: Licensed Clinical Social Worker

## 2011-07-28 LAB — URINALYSIS, ROUTINE W REFLEX MICROSCOPIC
Bilirubin Urine: NEGATIVE
Hgb urine dipstick: NEGATIVE
Nitrite: NEGATIVE
Protein, ur: NEGATIVE
Specific Gravity, Urine: 1.008
Urobilinogen, UA: 1

## 2011-07-28 LAB — DRUGS OF ABUSE SCREEN W/O ALC, ROUTINE URINE
Amphetamine Screen, Ur: NEGATIVE
Barbiturate Quant, Ur: NEGATIVE
Cocaine Metabolites: NEGATIVE
Methadone: NEGATIVE
Opiate Screen, Urine: NEGATIVE
Propoxyphene: NEGATIVE

## 2011-07-28 LAB — COMPREHENSIVE METABOLIC PANEL
CO2: 25
Calcium: 9.4
Creatinine, Ser: 0.94
GFR calc Af Amer: >60
GFR calc non Af Amer: >60
Glucose, Bld: 109 — ABNORMAL HIGH
Sodium: 140
Total Protein: 6.4

## 2011-07-28 LAB — MAGNESIUM: Magnesium: 2.3

## 2011-12-14 ENCOUNTER — Ambulatory Visit (INDEPENDENT_AMBULATORY_CARE_PROVIDER_SITE_OTHER): Payer: 59 | Admitting: Internal Medicine

## 2011-12-14 ENCOUNTER — Encounter: Payer: Self-pay | Admitting: Internal Medicine

## 2011-12-14 VITALS — BP 140/80 | HR 76 | Temp 98.2°F | Resp 16 | Ht 62.0 in | Wt 154.0 lb

## 2011-12-14 DIAGNOSIS — E669 Obesity, unspecified: Secondary | ICD-10-CM

## 2011-12-14 DIAGNOSIS — E538 Deficiency of other specified B group vitamins: Secondary | ICD-10-CM

## 2011-12-14 DIAGNOSIS — M722 Plantar fascial fibromatosis: Secondary | ICD-10-CM

## 2011-12-14 DIAGNOSIS — IMO0001 Reserved for inherently not codable concepts without codable children: Secondary | ICD-10-CM

## 2011-12-14 DIAGNOSIS — M797 Fibromyalgia: Secondary | ICD-10-CM

## 2011-12-14 DIAGNOSIS — M7730 Calcaneal spur, unspecified foot: Secondary | ICD-10-CM

## 2011-12-14 MED ORDER — METHADONE HCL 5 MG PO TABS: 5.0000 mg | ORAL_TABLET | Freq: Three times a day (TID) | ORAL | Status: DC

## 2011-12-14 MED ORDER — FLUOXETINE HCL 40 MG PO CAPS: 40.0000 mg | ORAL_CAPSULE | Freq: Every day | ORAL | Status: DC

## 2011-12-14 MED ORDER — TOPIRAMATE 200 MG PO TABS: 200.0000 mg | ORAL_TABLET | Freq: Every day | ORAL | Status: DC

## 2011-12-14 MED ORDER — PHENTERMINE HCL 37.5 MG PO CAPS: 37.5000 mg | ORAL_CAPSULE | ORAL | Status: DC

## 2011-12-14 MED ORDER — CYANOCOBALAMIN 1000 MCG/ML IJ SOLN: 1000.0000 ug | INTRAMUSCULAR | Status: DC

## 2011-12-14 NOTE — Progress Notes (Signed)
Subjective:    Patient ID: Alison Brooks, female    DOB: 1956/05/13, 56 y.o.   MRN: [id number]  HPI Patient is a 56 year old white female who presents for refill of her chronic pain medicines for fibromyalgia with her to the acute complaint today of pain in her heels bilaterally that appears to be more likely plantar fasciitis and tenosynovitis right of the right arm at the brachioradialis insertion or tennis elbow.  The patient describes pain in her heels and in the bursa behind her Achilles tendon that occurs after she has been off her feet for a while either at night first in the morning or after she's been sitting for a while the pain is relieved after she has been walking for spell but recurs after rest   Review of Systems  Musculoskeletal: Positive for back pain, joint swelling and gait problem.   Past Medical History  Diagnosis Date  . Fibromyalgia   . Chronic back pain   . Migraine   . Depression     History   Social History  . Marital Status: Married    Spouse Name: N/A    Number of Children: N/A  . Years of Education: N/A   Occupational History  . Not on file.   Social History Main Topics  . Smoking status: Current Everyday Smoker  . Smokeless tobacco: Not on file  . Alcohol Use: Yes  . Drug Use:   . Sexually Active: Yes   Other Topics Concern  . Not on file   Social History Narrative  . No narrative on file    Past Surgical History  Procedure Date  . Lumbar fusion   . Carpal tunnel release   . Cholecystectomy 1995  . Abdominal hysterectomy   . Tonsillectomy 1964  . Back surgery   . Hemorrhoid surgery     Family History  Problem Relation Age of Onset  . Heart attack    . Colon cancer    . Hypertension Mother   . Heart disease Father     Allergies  Allergen Reactions  . Hydrocodone-Acetaminophen     Current Outpatient Prescriptions on File Prior to Visit  Medication Sig Dispense Refill  . estradiol (VIVELLE-DOT) 0.05 MG/24HR Place 1  patch onto the skin once a week.        . Multiple Vitamin (MULTIVITAMIN) tablet Take 1 tablet by mouth daily.        Marland Kitchen oxybutynin (OXYTROL) 3.9 mg/24hr Place 1 patch onto the skin 2 (two) times a week.        . trimethoprim (TRIMPEX) 100 MG tablet Take 100 mg by mouth daily.        . Vitamin D, Ergocalciferol, (DRISDOL) 50000 UNITS CAPS Take 50,000 Units by mouth every 7 (seven) days.          BP 140/80  Pulse 76  Temp 98.2 F (36.8 C)  Resp 16  Ht 5\' 2"  (1.575 m)  Wt 154 lb (69.854 kg)  BMI 28.17 kg/m2       Objective:   Physical Exam  Constitutional: She is oriented to person, place, and time.       Weight gain noted  HENT:  Head: Normocephalic and atraumatic.  Eyes: Conjunctivae are normal. Pupils are equal, round, and reactive to light.  Neck: Normal range of motion. Neck supple.  Cardiovascular: Normal rate and regular rhythm.   Pulmonary/Chest: Breath sounds normal. She has no wheezes. She has no rales.  Abdominal: Bowel sounds are normal.  She exhibits no distension. There is no tenderness.  Musculoskeletal:       Significant tenderness on the plantar fascia at the insertion of the heel bilaterally  Neurological: She is oriented to person, place, and time.          Assessment & Plan:  Patient is stable her current pain medications Advair refilled.  We discussed the patient's increasing weight effect on her plantar fasciitis as well as on her back she committed to exercise weight loss and a program of portion control.  Otherwise the patient is stable for her chronic problems were  We'll mobilize plantar fasciitis this was confirmed by physical examination and injection therapy was recommended   Informed consent was obtained. The patient's plantar surface of the foot at the calcaneus insertion of the plantar fascia bilaterally was prepped with Betadine in a sterile manner and 20 mg of Depo-Medrol with 1/2 cc of 1% lidocaine without epinephrine was inserted into  the tendons bilaterally. The patient tolerated the procedure well.

## 2011-12-14 NOTE — Patient Instructions (Signed)
FOOT STRPlantar Fasciitis (Heel Spur Syndrome) with Rehab The plantar fascia is a fibrous, ligament-like, soft-tissue structure that spans the bottom of the foot. Plantar fasciitis is a condition that causes pain in the foot due to inflammation of the tissue. SYMPTOMS   Pain and tenderness on the underneath side of the foot.   Pain that worsens with standing or walking.  CAUSES  Plantar fasciitis is caused by irritation and injury to the plantar fascia on the underneath side of the foot. Common mechanisms of injury include:  Direct trauma to bottom of the foot.   Damage to a small nerve that runs under the foot where the main fascia attaches to the heel bone.   Stress placed on the plantar fascia due to bone spurs.  RISK INCREASES WITH:   Activities that place stress on the plantar fascia (running, jumping, pivoting, or cutting).   Poor strength and flexibility.   Improperly fitted shoes.   Tight calf muscles.   Flat feet.   Failure to warm-up properly before activity.   Obesity.  PREVENTION  Warm up and stretch properly before activity.   Allow for adequate recovery between workouts.   Maintain physical fitness:   Strength, flexibility, and endurance.   Cardiovascular fitness.   Maintain a health body weight.   Avoid stress on the plantar fascia.   Wear properly fitted shoes, including arch supports for individuals who have flat feet.  PROGNOSIS  If treated properly, then the symptoms of plantar fasciitis usually resolve without surgery. However, occasionally surgery is necessary. RELATED COMPLICATIONS   Recurrent symptoms that may result in a chronic condition.   Problems of the lower back that are caused by compensating for the injury, such as limping.   Pain or weakness of the foot during push-off following surgery.   Chronic inflammation, scarring, and partial or complete fascia tear, occurring more often from repeated injections.  TREATMENT  Treatment  initially involves the use of ice and medication to help reduce pain and inflammation. The use of strengthening and stretching exercises may help reduce pain with activity, especially stretches of the Achilles tendon. These exercises may be performed at home or with a therapist. Your caregiver may recommend that you use heel cups of arch supports to help reduce stress on the plantar fascia. Occasionally, corticosteroid injections are given to reduce inflammation. If symptoms persist for greater than 6 months despite non-surgical (conservative), then surgery may be recommended.  MEDICATION   If pain medication is necessary, then nonsteroidal anti-inflammatory medications, such as aspirin and ibuprofen, or other minor pain relievers, such as acetaminophen, are often recommended.   Do not take pain medication within 7 days before surgery.   Prescription pain relievers may be given if deemed necessary by your caregiver. Use only as directed and only as much as you need.   Corticosteroid injections may be given by your caregiver. These injections should be reserved for the most serious cases, because they may only be given a certain number of times.  HEAT AND COLD  Cold treatment (icing) relieves pain and reduces inflammation. Cold treatment should be applied for 10 to 15 minutes every 2 to 3 hours for inflammation and pain and immediately after any activity that aggravates your symptoms. Use ice packs or massage the area with a piece of ice (ice massage).   Heat treatment may be used prior to performing the stretching and strengthening activities prescribed by your caregiver, physical therapist, or athletic trainer. Use a heat pack or soak  the injury in warm water.  SEEK IMMEDIATE MEDICAL CARE IF:  Treatment seems to offer no benefit, or the condition worsens.   Any medications produce adverse side effects.  EXERCISES RANGE OF MOTION (ROM) AND STRETCHING EXERCISES - Plantar Fasciitis (Heel Spur  Syndrome) These exercises may help you when beginning to rehabilitate your injury. Your symptoms may resolve with or without further involvement from your physician, physical therapist or athletic trainer. While completing these exercises, remember:   Restoring tissue flexibility helps normal motion to return to the joints. This allows healthier, less painful movement and activity.   An effective stretch should be held for at least 30 seconds.   A stretch should never be painful. You should only feel a gentle lengthening or release in the stretched tissue.  RANGE OF MOTION - Toe Extension, Flexion  Sit with your right / left leg crossed over your opposite knee.   Grasp your toes and gently pull them back toward the top of your foot. You should feel a stretch on the bottom of your toes and/or foot.   Hold this stretch for __________ seconds.   Now, gently pull your toes toward the bottom of your foot. You should feel a stretch on the top of your toes and or foot.   Hold this stretch for __________ seconds.  Repeat __________ times. Complete this stretch __________ times per day.  RANGE OF MOTION - Ankle Dorsiflexion, Active Assisted  Remove shoes and sit on a chair that is preferably not on a carpeted surface.   Place right / left foot under knee. Extend your opposite leg for support.   Keeping your heel down, slide your right / left foot back toward the chair until you feel a stretch at your ankle or calf. If you do not feel a stretch, slide your bottom forward to the edge of the chair, while still keeping your heel down.   Hold this stretch for __________ seconds.  Repeat __________ times. Complete this stretch __________ times per day.  STRETCH - Gastroc, Standing  Place hands on wall.   Extend right / left leg, keeping the front knee somewhat bent.   Slightly point your toes inward on your back foot.   Keeping your right / left heel on the floor and your knee straight, shift  your weight toward the wall, not allowing your back to arch.   You should feel a gentle stretch in the right / left calf. Hold this position for __________ seconds.  Repeat __________ times. Complete this stretch __________ times per day. STRETCH - Soleus, Standing  Place hands on wall.   Extend right / left leg, keeping the other knee somewhat bent.   Slightly point your toes inward on your back foot.   Keep your right / left heel on the floor, bend your back knee, and slightly shift your weight over the back leg so that you feel a gentle stretch deep in your back calf.   Hold this position for __________ seconds.  Repeat __________ times. Complete this stretch __________ times per day. STRETCH - Gastrocsoleus, Standing  Note: This exercise can place a lot of stress on your foot and ankle. Please complete this exercise only if specifically instructed by your caregiver.   Place the ball of your right / left foot on a step, keeping your other foot firmly on the same step.   Hold on to the wall or a rail for balance.   Slowly lift your other foot, allowing  your body weight to press your heel down over the edge of the step.   You should feel a stretch in your right / left calf.   Hold this position for __________ seconds.   Repeat this exercise with a slight bend in your right / left knee.  Repeat __________ times. Complete this stretch __________ times per day.  STRENGTHENING EXERCISES - Plantar Fasciitis (Heel Spur Syndrome)  These exercises may help you when beginning to rehabilitate your injury. They may resolve your symptoms with or without further involvement from your physician, physical therapist or athletic trainer. While completing these exercises, remember:   Muscles can gain both the endurance and the strength needed for everyday activities through controlled exercises.   Complete these exercises as instructed by your physician, physical therapist or athletic trainer.  Progress the resistance and repetitions only as guided.  STRENGTH - Towel Curls  Sit in a chair positioned on a non-carpeted surface.   Place your foot on a towel, keeping your heel on the floor.   Pull the towel toward your heel by only curling your toes. Keep your heel on the floor.   If instructed by your physician, physical therapist or athletic trainer, add ____________________ at the end of the towel.  Repeat __________ times. Complete this exercise __________ times per day. STRENGTH - Ankle Inversion  Secure one end of a rubber exercise band/tubing to a fixed object (table, pole). Loop the other end around your foot just before your toes.   Place your fists between your knees. This will focus your strengthening at your ankle.   Slowly, pull your big toe up and in, making sure the band/tubing is positioned to resist the entire motion.   Hold this position for __________ seconds.   Have your muscles resist the band/tubing as it slowly pulls your foot back to the starting position.  Repeat __________ times. Complete this exercises __________ times per day.  Document Released: 10/11/2005 Document Revised: 06/23/2011 Document Reviewed: 01/23/2009 [facility] Patient Information 2012 [place], [place].ETCHES

## 2011-12-16 MED ORDER — METHYLPREDNISOLONE ACETATE PF 40 MG/ML IJ SUSP: 20.0000 mg | Freq: Once | INTRAMUSCULAR | Status: DC

## 2012-03-13 ENCOUNTER — Ambulatory Visit: Payer: 59 | Admitting: Internal Medicine

## 2012-04-24 ENCOUNTER — Ambulatory Visit: Payer: 59 | Admitting: Internal Medicine

## 2012-08-04 ENCOUNTER — Encounter: Payer: Self-pay | Admitting: Family Medicine

## 2012-08-04 ENCOUNTER — Ambulatory Visit (INDEPENDENT_AMBULATORY_CARE_PROVIDER_SITE_OTHER): Payer: 59 | Admitting: Family Medicine

## 2012-08-04 VITALS — BP 128/80 | HR 74 | Temp 98.6°F | Wt 164.0 lb

## 2012-08-04 DIAGNOSIS — Z79899 Other long term (current) drug therapy: Secondary | ICD-10-CM

## 2012-08-04 DIAGNOSIS — G589 Mononeuropathy, unspecified: Secondary | ICD-10-CM

## 2012-08-04 DIAGNOSIS — G629 Polyneuropathy, unspecified: Secondary | ICD-10-CM

## 2012-08-04 LAB — CBC WITH DIFFERENTIAL/PLATELET
Basophils Absolute: 0.1 10*3/uL (ref 0.0–0.1)
HCT: 42.9 % (ref 36.0–46.0)
Hemoglobin: 14 g/dL (ref 12.0–15.0)
Lymphs Abs: 2.1 10*3/uL (ref 0.7–4.0)
MCHC: 32.7 g/dL (ref 30.0–36.0)
MCV: 96.4 fl (ref 78.0–100.0)
Monocytes Absolute: 0.5 10*3/uL (ref 0.1–1.0)
Monocytes Relative: 6.6 % (ref 3.0–12.0)
Neutro Abs: 4.9 10*3/uL (ref 1.4–7.7)
Platelets: 194 10*3/uL (ref 150.0–400.0)
RDW: 13.9 % (ref 11.5–14.6)

## 2012-08-04 LAB — HEPATIC FUNCTION PANEL
ALT: 43 U/L — ABNORMAL HIGH (ref 0–35)
AST: 31 U/L (ref 0–37)
Alkaline Phosphatase: 79 U/L (ref 39–117)
Bilirubin, Direct: 0.1 mg/dL (ref 0.0–0.3)
Total Bilirubin: 0.5 mg/dL (ref 0.3–1.2)

## 2012-08-04 LAB — BASIC METABOLIC PANEL
Chloride: 105 mEq/L (ref 96–112)
GFR: 95.88 mL/min (ref >60.00)
Potassium: 4.3 mEq/L (ref 3.5–5.1)
Sodium: 140 mEq/L (ref 135–145)

## 2012-08-04 MED ORDER — GABAPENTIN 100 MG PO CAPS: 100.0000 mg | ORAL_CAPSULE | Freq: Three times a day (TID) | ORAL | Status: DC

## 2012-08-04 NOTE — Progress Notes (Signed)
  Subjective:    Patient ID: Alison Brooks, female    DOB: Jan 21, 1956, 56 y.o.   MRN: [id number]  HPI Here for worsening burning pains in both feet. This has been going on for several years but is getting worse. No hand involvement.    Review of Systems  Constitutional: Negative.   Neurological: Negative.        Objective:   Physical Exam  Constitutional: She is oriented to person, place, and time. She appears well-developed and well-nourished.  Cardiovascular: Intact distal pulses.   Musculoskeletal: She exhibits no edema and no tenderness.  Neurological: She is alert and oriented to person, place, and time. She has normal reflexes. No cranial nerve deficit. She exhibits normal muscle tone. Coordination normal.  Skin: Skin is warm and dry.          Assessment & Plan:  Get labs to look for causes of neuropathy. Try Neurontin 100 mg tid. Follow up with Dr. Lovell Sheehan

## 2012-08-15 ENCOUNTER — Ambulatory Visit: Payer: 59 | Admitting: Internal Medicine

## 2012-09-13 ENCOUNTER — Ambulatory Visit (INDEPENDENT_AMBULATORY_CARE_PROVIDER_SITE_OTHER): Payer: 59 | Admitting: Internal Medicine

## 2012-09-13 ENCOUNTER — Encounter: Payer: Self-pay | Admitting: Internal Medicine

## 2012-09-13 VITALS — BP 144/80 | HR 72 | Temp 98.2°F | Resp 16 | Ht 62.0 in | Wt 166.0 lb

## 2012-09-13 DIAGNOSIS — G8929 Other chronic pain: Secondary | ICD-10-CM

## 2012-09-13 DIAGNOSIS — M797 Fibromyalgia: Secondary | ICD-10-CM

## 2012-09-13 DIAGNOSIS — E538 Deficiency of other specified B group vitamins: Secondary | ICD-10-CM

## 2012-09-13 DIAGNOSIS — IMO0001 Reserved for inherently not codable concepts without codable children: Secondary | ICD-10-CM

## 2012-09-13 MED ORDER — METHADONE HCL 5 MG PO TABS: 5.0000 mg | ORAL_TABLET | Freq: Three times a day (TID) | ORAL | Status: DC

## 2012-09-13 MED ORDER — PHENTERMINE HCL 37.5 MG PO CAPS: 37.5000 mg | ORAL_CAPSULE | ORAL | Status: DC

## 2012-09-13 MED ORDER — GABAPENTIN 100 MG PO CAPS: 100.0000 mg | ORAL_CAPSULE | Freq: Three times a day (TID) | ORAL | Status: DC

## 2012-09-13 MED ORDER — FLUOXETINE HCL 40 MG PO CAPS: 40.0000 mg | ORAL_CAPSULE | Freq: Every day | ORAL | Status: DC

## 2012-09-13 MED ORDER — CYANOCOBALAMIN 1000 MCG/ML IJ SOLN: 1000.0000 ug | INTRAMUSCULAR | Status: DC

## 2012-09-13 MED ORDER — TOPIRAMATE 200 MG PO TABS: 200.0000 mg | ORAL_TABLET | Freq: Every day | ORAL | Status: DC

## 2012-09-13 NOTE — Progress Notes (Signed)
Subjective:    Patient ID: Alison Brooks, female    DOB: 07-May-1956, 56 y.o.   MRN: [id number]  HPI  Pain control by protocol  Weight loss and need for diet program  I have spent more than 30 minutes examining this patient face-to-face of which over half was spent in counseling about diet   Review of Systems  Constitutional: Negative for activity change, appetite change and fatigue.  HENT: Negative for ear pain, congestion, neck pain, postnasal drip and sinus pressure.   Eyes: Negative for redness and visual disturbance.  Respiratory: Negative for cough, shortness of breath and wheezing.   Gastrointestinal: Negative for abdominal pain and abdominal distention.  Genitourinary: Negative for dysuria, frequency and menstrual problem.  Musculoskeletal: Negative for myalgias, joint swelling and arthralgias.  Skin: Negative for rash and wound.  Neurological: Negative for dizziness, weakness and headaches.  Hematological: Negative for adenopathy. Does not bruise/bleed easily.  Psychiatric/Behavioral: Negative for sleep disturbance and decreased concentration.   Past Medical History  Diagnosis Date  . Fibromyalgia   . Chronic back pain   . Migraine   . Depression     History   Social History  . Marital Status: Married    Spouse Name: N/A    Number of Children: N/A  . Years of Education: N/A   Occupational History  . Not on file.   Social History Main Topics  . Smoking status: Former Games developer  . Smokeless tobacco: Never Used     Comment: quit in 2009  . Alcohol Use: No  . Drug Use: No  . Sexually Active: Yes   Other Topics Concern  . Not on file   Social History Narrative  . No narrative on file    Past Surgical History  Procedure Date  . Lumbar fusion   . Carpal tunnel release   . Cholecystectomy 1995  . Abdominal hysterectomy   . Tonsillectomy 1964  . Back surgery   . Hemorrhoid surgery     Family History  Problem Relation Age of Onset  . Heart attack     . Colon cancer    . Hypertension Mother   . Heart disease Father     Allergies  Allergen Reactions  . Hydrocodone-Acetaminophen     Current Outpatient Prescriptions on File Prior to Visit  Medication Sig Dispense Refill  . Multiple Vitamin (MULTIVITAMIN) tablet Take 1 tablet by mouth daily.        . phentermine 37.5 MG capsule Take 1 capsule (37.5 mg total) by mouth every morning.  30 capsule  3  . trimethoprim (TRIMPEX) 100 MG tablet Take 100 mg by mouth daily.        . Vitamin D, Ergocalciferol, (DRISDOL) 50000 UNITS CAPS Take 50,000 Units by mouth every 7 (seven) days.        . [DISCONTINUED] FLUoxetine (PROZAC) 40 MG capsule Take 1 capsule (40 mg total) by mouth daily.  30 capsule  6  . [DISCONTINUED] gabapentin (NEURONTIN) 100 MG capsule Take 1 capsule (100 mg total) by mouth 3 (three) times daily.  90 capsule  0  . [DISCONTINUED] topiramate (TOPAMAX) 200 MG tablet Take 1 tablet (200 mg total) by mouth daily.  30 tablet  6   Current Facility-Administered Medications on File Prior to Visit  Medication Dose Route Frequency Provider Last Rate Last Dose  . methylPREDNISolone acetate PF (DEPO-MEDROL) injection 20 mg  20 mg Intra-articular Once Stacie Glaze, MD      . methylPREDNISolone acetate PF (DEPO-MEDROL)  injection 20 mg  20 mg Intra-articular Once Stacie Glaze, MD        BP 144/80  Pulse 72  Temp 98.2 F (36.8 C)  Resp 16  Ht 5\' 2"  (1.575 m)  Wt 166 lb (75.297 kg)  BMI 30.36 kg/m2       Objective:   Physical Exam  Vitals reviewed. Constitutional: She is oriented to person, place, and time. She appears well-developed and well-nourished. No distress.  HENT:  Head: Normocephalic and atraumatic.  Right Ear: External ear normal.  Left Ear: External ear normal.  Nose: Nose normal.  Mouth/Throat: Oropharynx is clear and moist.  Eyes: Conjunctivae normal and EOM are normal. Pupils are equal, round, and reactive to light.  Neck: Normal range of motion. Neck  supple. No JVD present. No tracheal deviation present. No thyromegaly present.  Cardiovascular: Normal rate, regular rhythm, normal heart sounds and intact distal pulses.   No murmur heard. Pulmonary/Chest: Effort normal and breath sounds normal. She has no wheezes. She exhibits no tenderness.  Abdominal: Soft. Bowel sounds are normal.  Musculoskeletal: Normal range of motion. She exhibits no edema and no tenderness.  Lymphadenopathy:    She has no cervical adenopathy.  Neurological: She is alert and oriented to person, place, and time. She has normal reflexes. No cranial nerve deficit.  Skin: Skin is warm and dry. She is not diaphoretic.  Psychiatric: She has a normal mood and affect. Her behavior is normal.          Assessment & Plan:  Paleo diet counseling pain refill per protocol

## 2012-12-09 ENCOUNTER — Other Ambulatory Visit: Payer: Self-pay

## 2012-12-20 ENCOUNTER — Ambulatory Visit: Payer: 59 | Admitting: Internal Medicine

## 2012-12-25 ENCOUNTER — Telehealth: Payer: Self-pay | Admitting: Internal Medicine

## 2012-12-25 NOTE — Telephone Encounter (Signed)
Pt would like MD to accept her hus to new pt. Pt was going to [place]. Pt would like MD to call her

## 2012-12-25 NOTE — Telephone Encounter (Signed)
Husband will take another md here-i sent call to you

## 2012-12-26 ENCOUNTER — Ambulatory Visit: Payer: 59 | Admitting: Internal Medicine

## 2013-02-05 ENCOUNTER — Telehealth: Payer: Self-pay | Admitting: Internal Medicine

## 2013-02-05 NOTE — Telephone Encounter (Signed)
Patient Information:  Caller Name: Luzelena  Phone: [phone]  Patient: Alison Brooks  Gender: Female  DOB: 10-09-1956  Age: 57 Years  PCP: Darryll Capers (Adults only)  Office Follow Up:  Does the office need to follow up with this patient?: No  Instructions For The Office: N/A  RN Note:  Appointment made (with help of [facility]) for 02/07/2013 at 10:00 with Dr. Lovell Sheehan.  Symptoms  Reason For Call & Symptoms: Onset one week ago dysuria, urgency, fullness in bladder, and bladder spasms.  Afebrile and no blood in urine  Reviewed Health History In EMR: Yes  Reviewed Medications In EMR: Yes  Reviewed Allergies In EMR: Yes  Reviewed Surgeries / Procedures: Yes  Date of Onset of Symptoms: 01/29/2013  Treatments Tried: Antibiotic - PCN, 3 days worth  Treatments Tried Worked: No  Guideline(s) Used:  Urination Pain - Female  Disposition Per Guideline:   See Today in Office  Reason For Disposition Reached:   Taking antibiotic > 3 days for UTI and painful urination not improved  Advice Given:  Fluids  : Drink extra fluids (Reason: to produce a dilute, nonirritating urine).  Call Back If:  You become worse.  Patient Will Follow Care Advice:  YES  Appointment Scheduled:  02/07/2013 10:00:00 Appointment Scheduled Provider:  Darryll Capers (Adults only)

## 2013-02-07 ENCOUNTER — Encounter: Payer: Self-pay | Admitting: Internal Medicine

## 2013-02-07 ENCOUNTER — Other Ambulatory Visit: Payer: Self-pay | Admitting: *Deleted

## 2013-02-07 ENCOUNTER — Ambulatory Visit (INDEPENDENT_AMBULATORY_CARE_PROVIDER_SITE_OTHER): Payer: 59 | Admitting: Internal Medicine

## 2013-02-07 VITALS — BP 140/80 | HR 76 | Temp 98.1°F | Resp 16 | Ht 63.0 in | Wt 168.0 lb

## 2013-02-07 DIAGNOSIS — M797 Fibromyalgia: Secondary | ICD-10-CM

## 2013-02-07 DIAGNOSIS — K2970 Gastritis, unspecified, without bleeding: Secondary | ICD-10-CM

## 2013-02-07 DIAGNOSIS — N3010 Interstitial cystitis (chronic) without hematuria: Secondary | ICD-10-CM

## 2013-02-07 DIAGNOSIS — R350 Frequency of micturition: Secondary | ICD-10-CM

## 2013-02-07 DIAGNOSIS — G894 Chronic pain syndrome: Secondary | ICD-10-CM

## 2013-02-07 LAB — POCT URINALYSIS DIPSTICK
Glucose, UA: NEGATIVE
Spec Grav, UA: 1.01
Urobilinogen, UA: 0.2
pH, UA: 5.5

## 2013-02-07 MED ORDER — METHADONE HCL 5 MG PO TABS: 5.0000 mg | ORAL_TABLET | Freq: Three times a day (TID) | ORAL | Status: DC

## 2013-02-07 MED ORDER — ESOMEPRAZOLE MAGNESIUM 40 MG PO CPDR: 40.0000 mg | DELAYED_RELEASE_CAPSULE | Freq: Every day | ORAL | Status: DC

## 2013-02-07 NOTE — Progress Notes (Signed)
Subjective:    Patient ID: Alison Brooks, female    DOB: [birth year], 57 y.o.   MRN: [id number]  HPI UTI symptoms dysuria and buring with no fever or flank pain Has been off the medications due to nausea and gastritis   Review of Systems  Constitutional: Positive for fatigue. Negative for activity change and appetite change.  HENT: Negative for ear pain, congestion, neck pain, postnasal drip and sinus pressure.   Eyes: Negative for redness and visual disturbance.  Respiratory: Negative for cough, shortness of breath and wheezing.   Gastrointestinal: Negative for abdominal pain and abdominal distention.  Genitourinary: Negative for dysuria, frequency and menstrual problem.  Musculoskeletal: Positive for myalgias and arthralgias. Negative for joint swelling.  Skin: Negative for rash and wound.  Neurological: Positive for weakness. Negative for dizziness and headaches.  Hematological: Negative for adenopathy. Does not bruise/bleed easily.  Psychiatric/Behavioral: Negative for sleep disturbance and decreased concentration.   Past Medical History  Diagnosis Date  . Fibromyalgia   . Chronic back pain   . Migraine   . Depression     History   Social History  . Marital Status: Married    Spouse Name: N/A    Number of Children: N/A  . Years of Education: N/A   Occupational History  . Not on file.   Social History Main Topics  . Smoking status: Former Games developer  . Smokeless tobacco: Never Used     Comment: quit in 2009  . Alcohol Use: No  . Drug Use: No  . Sexually Active: Yes   Other Topics Concern  . Not on file   Social History Narrative  . No narrative on file    Past Surgical History  Procedure Laterality Date  . Lumbar fusion    . Carpal tunnel release    . Cholecystectomy  1995  . Abdominal hysterectomy    . Tonsillectomy  1964  . Back surgery    . Hemorrhoid surgery      Family History  Problem Relation Age of Onset  . Heart attack    . Colon cancer     . Hypertension Mother   . Heart disease Father     Allergies  Allergen Reactions  . Hydrocodone-Acetaminophen     Current Outpatient Prescriptions on File Prior to Visit  Medication Sig Dispense Refill  . cyanocobalamin (,VITAMIN B-12,) 1000 MCG/ML injection Inject 1 mL (1,000 mcg total) into the muscle every 30 (thirty) days.  10 mL  1  . FLUoxetine (PROZAC) 40 MG capsule Take 1 capsule (40 mg total) by mouth daily.  30 capsule  6  . gabapentin (NEURONTIN) 100 MG capsule Take 1 capsule (100 mg total) by mouth 3 (three) times daily.  90 capsule  3  . methadone (DOLOPHINE) 5 MG tablet Take 1 tablet (5 mg total) by mouth 3 (three) times daily.  90 tablet  0  . Multiple Vitamin (MULTIVITAMIN) tablet Take 1 tablet by mouth daily.        . phentermine 37.5 MG capsule Take 1 capsule (37.5 mg total) by mouth every morning.  30 capsule  2  . topiramate (TOPAMAX) 200 MG tablet Take 1 tablet (200 mg total) by mouth daily.  30 tablet  6  . trimethoprim (TRIMPEX) 100 MG tablet Take 100 mg by mouth daily.        . Vitamin D, Ergocalciferol, (DRISDOL) 50000 UNITS CAPS Take 50,000 Units by mouth every 7 (seven) days.  Current Facility-Administered Medications on File Prior to Visit  Medication Dose Route Frequency Provider Last Rate Last Dose  . methylPREDNISolone acetate PF (DEPO-MEDROL) injection 20 mg  20 mg Intra-articular Once Stacie Glaze, MD      . methylPREDNISolone acetate PF (DEPO-MEDROL) injection 20 mg  20 mg Intra-articular Once Stacie Glaze, MD        BP 140/80  Pulse 76  Temp(Src) 98.1 F (36.7 C)  Resp 16  Ht 5\' 3"  (1.6 m)  Wt 168 lb (76.204 kg)  BMI 29.77 kg/m2       Objective:   Physical Exam  Nursing note and vitals reviewed. Constitutional: She is oriented to person, place, and time. She appears well-developed and well-nourished. No distress.  HENT:  Head: Normocephalic and atraumatic.  Mouth/Throat: Oropharynx is clear and moist.  Eyes: Conjunctivae  and EOM are normal. Pupils are equal, round, and reactive to light.  Neck: Normal range of motion. Neck supple. No JVD present. No tracheal deviation present. No thyromegaly present.  Cardiovascular: Normal rate, regular rhythm, normal heart sounds and intact distal pulses.   No murmur heard. Pulmonary/Chest: Effort normal and breath sounds normal. She has no wheezes. She exhibits no tenderness.  Abdominal: Soft. Bowel sounds are normal.  Musculoskeletal: She exhibits edema and tenderness.  Joint tenderness  Lymphadenopathy:    She has no cervical adenopathy.  Neurological: She is alert and oriented to person, place, and time. She has normal reflexes. No cranial nerve deficit.  Skin: Skin is warm and dry. She is not diaphoretic.  Psychiatric: She has a normal mood and affect. Her behavior is normal.          Assessment & Plan:  Gastritis and possible early PUD Trial of PPI and signs of bleeding instructions Refill pain meds per protocol Flair of IC off medications Saw dermatologist for skin cancer

## 2013-02-07 NOTE — Patient Instructions (Signed)
The patient is instructed to continue all medications as prescribed. Schedule followup with check out clerk upon leaving the clinic  

## 2013-03-15 ENCOUNTER — Emergency Department (HOSPITAL_COMMUNITY)
Admission: EM | Admit: 2013-03-15 | Discharge: 2013-03-15 | Disposition: A | Discharge status: Home / Self Care | Payer: Medicare Other | Attending: Emergency Medicine | Admitting: Emergency Medicine

## 2013-03-15 ENCOUNTER — Encounter (HOSPITAL_COMMUNITY): Payer: Self-pay | Admitting: *Deleted

## 2013-03-15 DIAGNOSIS — G43909 Migraine, unspecified, not intractable, without status migrainosus: Secondary | ICD-10-CM | POA: Insufficient documentation

## 2013-03-15 DIAGNOSIS — M792 Neuralgia and neuritis, unspecified: Secondary | ICD-10-CM

## 2013-03-15 DIAGNOSIS — IMO0002 Reserved for concepts with insufficient information to code with codable children: Secondary | ICD-10-CM | POA: Insufficient documentation

## 2013-03-15 DIAGNOSIS — F3289 Other specified depressive episodes: Secondary | ICD-10-CM | POA: Insufficient documentation

## 2013-03-15 DIAGNOSIS — Z8739 Personal history of other diseases of the musculoskeletal system and connective tissue: Secondary | ICD-10-CM | POA: Insufficient documentation

## 2013-03-15 DIAGNOSIS — Z79899 Other long term (current) drug therapy: Secondary | ICD-10-CM | POA: Insufficient documentation

## 2013-03-15 DIAGNOSIS — Z9889 Other specified postprocedural states: Secondary | ICD-10-CM | POA: Insufficient documentation

## 2013-03-15 DIAGNOSIS — Z87891 Personal history of nicotine dependence: Secondary | ICD-10-CM | POA: Insufficient documentation

## 2013-03-15 DIAGNOSIS — G589 Mononeuropathy, unspecified: Secondary | ICD-10-CM | POA: Insufficient documentation

## 2013-03-15 DIAGNOSIS — G8929 Other chronic pain: Secondary | ICD-10-CM | POA: Insufficient documentation

## 2013-03-15 DIAGNOSIS — F329 Major depressive disorder, single episode, unspecified: Secondary | ICD-10-CM | POA: Insufficient documentation

## 2013-03-15 DIAGNOSIS — R209 Unspecified disturbances of skin sensation: Secondary | ICD-10-CM | POA: Insufficient documentation

## 2013-03-15 MED ORDER — TRAMADOL HCL 50 MG PO TABS: 50.0000 mg | ORAL_TABLET | Freq: Four times a day (QID) | ORAL | Status: DC | PRN

## 2013-03-15 NOTE — ED Notes (Signed)
States her right foot pain has gotten so bad it is waking her up from her sleep. PT states any pressure on top of foot is unbearable. No swelling, redness, or deformity noted. Pulse present.

## 2013-03-15 NOTE — ED Notes (Signed)
Pt reports (R) foot pain x 6 months.  Been to PCP x 3 times without relief.  Pt states that she was dx with neuropathy.

## 2013-03-15 NOTE — ED Provider Notes (Signed)
History  This chart was scribed for non-physician practitioner Roxy Horseman, PA-C working with Ward Givens, MD, by Candelaria Stagers, ED Scribe. This patient was seen in room TR05C/TR05C and the patient's care was started at 9:11 PM   CSN: [id number]  Arrival date & time 03/15/13  1955   First MD Initiated Contact with Patient 03/15/13 2038      Chief Complaint  Patient presents with  . Foot Pain     The history is provided by the patient. No language interpreter was used.   HPI Comment Alison Brooks is a 57 y.o. female who presents to the Emergency Department complaining of right foot pain that started about six months ago and has gradually worsened.  Pt has seen her PCP three times and was diagnosed with neuropathy and prescribed gabapentin which she reports has provided no relief.  Pt states that touching the top of the foot causes severe pain.  Pt has h/o right leg numbness which is associated with h/o back surgery.  Pt has been referred to a neurologist but states she cannot be seen for three months.     Past Medical History  Diagnosis Date  . Fibromyalgia   . Chronic back pain   . Migraine   . Depression     Past Surgical History  Procedure Laterality Date  . Lumbar fusion    . Carpal tunnel release    . Cholecystectomy  1995  . Abdominal hysterectomy    . Tonsillectomy  1964  . Back surgery    . Hemorrhoid surgery      Family History  Problem Relation Age of Onset  . Heart attack    . Colon cancer    . Hypertension Mother   . Heart disease Father     History  Substance Use Topics  . Smoking status: Former Games developer  . Smokeless tobacco: Never Used     Comment: quit in 2009  . Alcohol Use: No    OB History   Grav Para Term Preterm Abortions TAB SAB Ect Mult Living                  Review of Systems  Musculoskeletal: Positive for arthralgias (right foot pain).  Neurological: Positive for numbness.  All other systems reviewed and are  negative.    Allergies  Review of patient's allergies indicates no known allergies.  Home Medications   Current Outpatient Rx  Name  Route  Sig  Dispense  Refill  . cyanocobalamin (,VITAMIN B-12,) 1000 MCG/ML injection   Intramuscular   Inject 1 mL (1,000 mcg total) into the muscle every 30 (thirty) days.   10 mL   1   . FLUoxetine (PROZAC) 40 MG capsule   Oral   Take 1 capsule (40 mg total) by mouth daily.   30 capsule   6   . gabapentin (NEURONTIN) 100 MG capsule   Oral   Take 1 capsule (100 mg total) by mouth 3 (three) times daily.   90 capsule   3   . methadone (DOLOPHINE) 5 MG tablet   Oral   Take 5 mg by mouth daily.         Marland Kitchen omeprazole (PRILOSEC) 20 MG capsule   Oral   Take 20 mg by mouth daily.         Marland Kitchen topiramate (TOPAMAX) 200 MG tablet   Oral   Take 1 tablet (200 mg total) by mouth daily.   30 tablet  6     BP 149/89  Pulse 66  Temp(Src) 97.9 F (36.6 C)  Resp 18  SpO2 100%  Physical Exam  Nursing note and vitals reviewed. Constitutional: She is oriented to person, place, and time. She appears well-developed and well-nourished. No distress.  HENT:  Head: Normocephalic and atraumatic.  Eyes: EOM are normal.  Neck: Neck supple. No tracheal deviation present.  Cardiovascular: Normal rate.   Slow cap refill  Pulmonary/Chest: Effort normal. No respiratory distress.  Musculoskeletal: Normal range of motion.  Neurological: She is alert and oriented to person, place, and time.  Skin: Skin is warm and dry.  Psychiatric: She has a normal mood and affect. Her behavior is normal.    ED Course  Procedures  DIAGNOSTIC STUDIES: Oxygen Saturation is 100% on room air, normal by my interpretation.    COORDINATION OF CARE:  9:10 PM Discussed course of care with pt which includes .  Pt understands and agrees.    Labs Reviewed - No data to display No results found.   1. Neuropathic pain       MDM  Patient with neuropathic pain.  Currently taking methadone. I discussed the difficulty of treating neuropathic pain, and recommended that she try a tricyclic antidepressant or Ultram. She would like to try Ultram tonight. I will prescribe Ultram, and have her followup with her PCP, or her neurologist. She is stable and ready for discharge. She understands and agrees with the plan.  I personally performed the services described in this documentation, which was scribed in my presence. The recorded information has been reviewed and is accurate.        Roxy Horseman, PA-C 03/15/13 2338

## 2013-03-16 NOTE — ED Provider Notes (Signed)
Medical screening examination/treatment/procedure(s) were performed by non-physician practitioner and as supervising physician I was immediately available for consultation/collaboration. Daianna Vasques, MD, FACEP   Lamonda Noxon L Willisha Sligar, MD 03/16/13 0055 

## 2013-03-26 ENCOUNTER — Encounter: Payer: Self-pay | Admitting: Internal Medicine

## 2013-03-26 ENCOUNTER — Ambulatory Visit (INDEPENDENT_AMBULATORY_CARE_PROVIDER_SITE_OTHER): Payer: Medicare Other | Admitting: Internal Medicine

## 2013-03-26 VITALS — BP 126/80 | HR 76 | Temp 98.4°F | Wt 163.0 lb

## 2013-03-26 DIAGNOSIS — M79671 Pain in right foot: Secondary | ICD-10-CM

## 2013-03-26 DIAGNOSIS — E785 Hyperlipidemia, unspecified: Secondary | ICD-10-CM

## 2013-03-26 DIAGNOSIS — K219 Gastro-esophageal reflux disease without esophagitis: Secondary | ICD-10-CM | POA: Insufficient documentation

## 2013-03-26 DIAGNOSIS — IMO0001 Reserved for inherently not codable concepts without codable children: Secondary | ICD-10-CM

## 2013-03-26 DIAGNOSIS — M79609 Pain in unspecified limb: Secondary | ICD-10-CM

## 2013-03-26 MED ORDER — AMITRIPTYLINE HCL 50 MG PO TABS: 50.0000 mg | ORAL_TABLET | Freq: Every day | ORAL | Status: DC

## 2013-03-26 MED ORDER — ESOMEPRAZOLE MAGNESIUM 40 MG PO CPDR: 40.0000 mg | DELAYED_RELEASE_CAPSULE | Freq: Every day | ORAL | Status: DC

## 2013-03-26 NOTE — Patient Instructions (Addendum)
The patient is instructed to continue all medications as prescribed. Schedule followup with check out clerk upon leaving the clinic May use ali for fat blocking

## 2013-03-26 NOTE — Assessment & Plan Note (Signed)
Patient will need chronic PPD therapy recommend Nexium 40 mg by mouth twice a day for 2 weeks then daily for chronic prophylaxis

## 2013-03-26 NOTE — Progress Notes (Signed)
Subjective:    Patient ID: Alison Brooks, female    DOB: [birth year], 57 y.o.   MRN: [id number]  HPI  Acute on chronic GERD symptomatology with food reflux Fibromyalgia Stable depression Stable pain control  Review of Systems  Gastrointestinal: Positive for nausea, abdominal pain and abdominal distention.  Musculoskeletal: Positive for joint swelling and gait problem.   Past Medical History  Diagnosis Date  . Fibromyalgia   . Chronic back pain   . Migraine   . Depression     History   Social History  . Marital Status: Married    Spouse Name: N/A    Number of Children: N/A  . Years of Education: N/A   Occupational History  . Not on file.   Social History Main Topics  . Smoking status: Former Games developer  . Smokeless tobacco: Never Used     Comment: quit in 2009  . Alcohol Use: No  . Drug Use: No  . Sexually Active: Yes   Other Topics Concern  . Not on file   Social History Narrative  . No narrative on file    Past Surgical History  Procedure Laterality Date  . Lumbar fusion    . Carpal tunnel release    . Cholecystectomy  1995  . Abdominal hysterectomy    . Tonsillectomy  1964  . Back surgery    . Hemorrhoid surgery      Family History  Problem Relation Age of Onset  . Heart attack    . Colon cancer    . Hypertension Mother   . Heart disease Father     No Known Allergies  Current Outpatient Prescriptions on File Prior to Visit  Medication Sig Dispense Refill  . cyanocobalamin (,VITAMIN B-12,) 1000 MCG/ML injection Inject 1 mL (1,000 mcg total) into the muscle every 30 (thirty) days.  10 mL  1  . FLUoxetine (PROZAC) 40 MG capsule Take 1 capsule (40 mg total) by mouth daily.  30 capsule  6  . methadone (DOLOPHINE) 5 MG tablet Take 5 mg by mouth 3 (three) times daily as needed.       . topiramate (TOPAMAX) 200 MG tablet Take 1 tablet (200 mg total) by mouth daily.  30 tablet  6  . traMADol (ULTRAM) 50 MG tablet Take 1 tablet (50 mg total) by mouth  every 6 (six) hours as needed for pain.  15 tablet  0   No current facility-administered medications on file prior to visit.    BP 126/80  Pulse 76  Temp(Src) 98.4 F (36.9 C) (Oral)  Wt 163 lb (73.936 kg)  BMI 28.88 kg/m2       Objective:   Physical Exam  Nursing note and vitals reviewed. Constitutional: She is oriented to person, place, and time. She appears well-developed and well-nourished. No distress.  HENT:  Head: Normocephalic and atraumatic.  Mouth/Throat: Oropharynx is clear and moist.  Eyes: Conjunctivae and EOM are normal. Pupils are equal, round, and reactive to light.  Neck: Normal range of motion. Neck supple. No JVD present. No tracheal deviation present. No thyromegaly present.  Cardiovascular: Normal rate and regular rhythm.   No murmur heard. Pulmonary/Chest: Effort normal and breath sounds normal. She has no wheezes. She exhibits no tenderness.  Abdominal: Soft. Bowel sounds are normal.  Musculoskeletal: She exhibits edema and tenderness.  Multiple point tenderness  Lymphadenopathy:    She has no cervical adenopathy.  Neurological: She is alert and oriented to person, place, and time. She has  normal reflexes. No cranial nerve deficit.  Skin: Skin is warm and dry. She is not diaphoretic.  Psychiatric: She has a normal mood and affect. Her behavior is normal.          Assessment & Plan:  Patient is a 57 year old female who is followed for chronic low back pain fibromyalgia and with recent worsening of her gastroesophageal reflux with signs and symptoms consistent with erosive esophagitis.  Patient was given samples of a prescription strength PPI in her symptomology improved when she returned back to Prilosec 20 mg by mouth daily her symptomology has recurred.  Her symptomology includes active reflux of food into the esophagus as well as burning and pain  Would recommend Nexium 40 mg by mouth twice a day for 2 weeks then 1 by mouth daily as chronic  prophylaxis.  Tramadol

## 2013-04-24 ENCOUNTER — Encounter: Payer: Self-pay | Admitting: Internal Medicine

## 2013-04-24 ENCOUNTER — Telehealth: Payer: Self-pay | Admitting: Internal Medicine

## 2013-04-24 DIAGNOSIS — Z9882 Breast implant status: Secondary | ICD-10-CM

## 2013-04-24 NOTE — Telephone Encounter (Signed)
i sent referral to [facility] for m m digital screening with implants

## 2013-04-24 NOTE — Telephone Encounter (Signed)
PT called and stated that due to her implants and the pain in her left breast, she will need a diagnostic mammogram. If you could please prepare her a referral with a diagnostic code, she will schedule. Please assist.

## 2013-05-02 ENCOUNTER — Other Ambulatory Visit: Payer: Self-pay | Admitting: *Deleted

## 2013-05-02 DIAGNOSIS — Z9882 Breast implant status: Secondary | ICD-10-CM

## 2013-05-02 NOTE — Telephone Encounter (Signed)
PT called and stated that she would like to be seen by the breast center for this. Please correct the order. Thank you!

## 2013-05-02 NOTE — Telephone Encounter (Signed)
This was done for breast center

## 2013-05-14 ENCOUNTER — Other Ambulatory Visit: Payer: Self-pay | Admitting: Internal Medicine

## 2013-05-15 ENCOUNTER — Ambulatory Visit
Admission: RE | Admit: 2013-05-15 | Discharge: 2013-05-15 | Disposition: A | Discharge status: Home / Self Care | Payer: Medicare Other | Source: Ambulatory Visit | Attending: Internal Medicine | Admitting: Internal Medicine

## 2013-05-15 DIAGNOSIS — Z9882 Breast implant status: Secondary | ICD-10-CM

## 2013-06-07 ENCOUNTER — Ambulatory Visit (AMBULATORY_SURGERY_CENTER): Payer: Medicare Other | Admitting: *Deleted

## 2013-06-07 ENCOUNTER — Encounter: Payer: Self-pay | Admitting: Internal Medicine

## 2013-06-07 VITALS — Ht 63.0 in | Wt 165.4 lb

## 2013-06-07 DIAGNOSIS — Z86010 Personal history of colonic polyps: Secondary | ICD-10-CM

## 2013-06-07 MED ORDER — MOVIPREP 100 G PO SOLR: ORAL | Status: DC

## 2013-06-15 ENCOUNTER — Telehealth: Payer: Self-pay | Admitting: *Deleted

## 2013-06-15 NOTE — Telephone Encounter (Signed)
Olegario Messier, This pt is fine for [facility] I will be present at [facility] today. If you are not working today I will check with Dorene Grebe and determine a date when we can meet. Thanks, John ----- Message -----   From: Maple Hudson, RN Sent: 06/14/2013 7:21 AM To: Cathlyn Parsons, CRNA Subject: ok for Propofol John, I just wanted to follow up with this pt; did you talk to her and is she okay for procedure with propofol? I sent you message 8/14. Procedure scheduled for 8/28.

## 2013-06-21 ENCOUNTER — Encounter: Payer: Self-pay | Admitting: Internal Medicine

## 2013-06-21 ENCOUNTER — Ambulatory Visit (AMBULATORY_SURGERY_CENTER): Payer: Medicare Other | Admitting: Internal Medicine

## 2013-06-21 VITALS — BP 114/71 | HR 63 | Temp 97.3°F | Resp 22 | Ht 63.0 in | Wt 165.0 lb

## 2013-06-21 DIAGNOSIS — D126 Benign neoplasm of colon, unspecified: Secondary | ICD-10-CM

## 2013-06-21 DIAGNOSIS — Z86010 Personal history of colonic polyps: Secondary | ICD-10-CM

## 2013-06-21 MED ORDER — SODIUM CHLORIDE 0.9 % IV SOLN: 500.0000 mL | INTRAVENOUS | Status: DC

## 2013-06-21 NOTE — Patient Instructions (Addendum)
Findings:  Polyp Recommendations:  Follow up colonoscopy in 5 years.  YOU HAD AN ENDOSCOPIC PROCEDURE TODAY AT THE [facility]: Refer to the procedure report that was given to you for any specific questions about what was found during the examination.  If the procedure report does not answer your questions, please call your gastroenterologist to clarify.  If you requested that your care partner not be given the details of your procedure findings, then the procedure report has been included in a sealed envelope for you to review at your convenience later.  YOU SHOULD EXPECT: Some feelings of bloating in the abdomen. Passage of more gas than usual.  Walking can help get rid of the air that was put into your GI tract during the procedure and reduce the bloating. If you had a lower endoscopy (such as a colonoscopy or flexible sigmoidoscopy) you may notice spotting of blood in your stool or on the toilet paper. If you underwent a bowel prep for your procedure, then you may not have a normal bowel movement for a few days.  DIET: Your first meal following the procedure should be a light meal and then it is ok to progress to your normal diet.  A half-sandwich or bowl of soup is an example of a good first meal.  Heavy or fried foods are harder to digest and may make you feel nauseous or bloated.  Likewise meals heavy in dairy and vegetables can cause extra gas to form and this can also increase the bloating.  Drink plenty of fluids but you should avoid alcoholic beverages for 24 hours.  ACTIVITY: Your care partner should take you home directly after the procedure.  You should plan to take it easy, moving slowly for the rest of the day.  You can resume normal activity the day after the procedure however you should NOT DRIVE or use heavy machinery for 24 hours (because of the sedation medicines used during the test).    SYMPTOMS TO REPORT IMMEDIATELY: A gastroenterologist can be reached at any hour.   During normal business hours, 8:30 AM to 5:00 PM Monday through Friday, call [phone].  After hours and on weekends, please call the GI answering service at [phone] who will take a message and have the physician on call contact you.   Following lower endoscopy (colonoscopy or flexible sigmoidoscopy):  Excessive amounts of blood in the stool  Significant tenderness or worsening of abdominal pains  Swelling of the abdomen that is new, acute  Fever of 100F or higher  Following upper endoscopy (EGD)  Vomiting of blood or coffee ground material  New chest pain or pain under the shoulder blades  Painful or persistently difficult swallowing  New shortness of breath  Fever of 100F or higher  Black, tarry-looking stools  FOLLOW UP: If any biopsies were taken you will be contacted by phone or by letter within the next 1-3 weeks.  Call your gastroenterologist if you have not heard about the biopsies in 3 weeks.  Our staff will call the home number listed on your records the next business day following your procedure to check on you and address any questions or concerns that you may have at that time regarding the information given to you following your procedure. This is a courtesy call and so if there is no answer at the home number and we have not heard from you through the emergency physician on call, we will assume that you have returned to  your regular daily activities without incident.  SIGNATURES/CONFIDENTIALITY: You and/or your care partner have signed paperwork which will be entered into your electronic medical record.  These signatures attest to the fact that that the information above on your After Visit Summary has been reviewed and is understood.  Full responsibility of the confidentiality of this discharge information lies with you and/or your care-partner.  Please follow all discharge instructions given to you by the recovery room nurse. If you have any questions or  problems after discharge please call one of the numbers listed above. You will receive a phone call in the am to see how you are doing and answer any questions you may have. Thank you for choosing [facility] for your health care needs.

## 2013-06-21 NOTE — Progress Notes (Signed)
Patient did not experience any of the following events: a burn prior to discharge; a fall within the facility; wrong site/side/patient/procedure/implant event; or a hospital transfer or hospital admission upon discharge from the facility. (G8907) Patient did not have preoperative order for IV antibiotic SSI prophylaxis. (G8918)  

## 2013-06-21 NOTE — Op Note (Signed)
[facility] [address] [place], [address]   COLONOSCOPY PROCEDURE REPORT  PATIENT: Alison Brooks, Alison Brooks  MR#: [id number] BIRTHDATE: April 11, 1956 , 57  yrs. old GENDER: Female ENDOSCOPIST: Roxy Cedar, MD REFERRED [id number] Program Recall PROCEDURE DATE:  06/21/2013 PROCEDURE:   Colonoscopy with snare polypectomyx 1 First Screening Colonoscopy - Avg.  risk and is 50 yrs.  old or older - No.  Prior Negative Screening - Now for repeat screening. N/A  History of Adenoma - Now for follow-up colonoscopy & has been > or = to 3 yrs.  Yes hx of adenoma.  Has been 3 or more years since last colonoscopy.  Polyps Removed Today? Yes. ASA CLASS:   Class II INDICATIONS:Patient's personal history of adenomatous colon polyps. 2007 advanced adenoma ; f/u 2008; now overdue MEDICATIONS: MAC sedation, administered by CRNA and propofol (Diprivan) 250mg  IV  DESCRIPTION OF PROCEDURE:   After the risks benefits and alternatives of the procedure were thoroughly explained, informed consent was obtained.  A digital rectal exam revealed no abnormalities of the rectum.   The [id number]  endoscope was introduced through the anus and advanced to the cecum, which was identified by both the appendix and ileocecal valve. No adverse events experienced.   The quality of the prep was adequate, using MoviPrep  The instrument was then slowly withdrawn as the colon was fully examined.    COLON FINDINGS: A diminutive polyp was found at the cecum.  A polypectomy was performed with a cold snare.  The resection was complete and the polyp tissue was completely retrieved.   The colon mucosa was otherwise normal.  Retroflexed views revealed no abnormalities. The time to cecum=4 minutes 35 seconds.  Withdrawal time=11 minutes 25 seconds.  The scope was withdrawn and the procedure completed. COMPLICATIONS: There were no complications.  ENDOSCOPIC IMPRESSION: 1.   Diminutive polyp was  found at the cecum; polypectomy was performed with a cold snare 2.   The colon mucosa was otherwise normal  RECOMMENDATIONS: 1. Follow up colonoscopy in 5 years   eSigned:  Roxy Cedar, MD 06/21/2013 8:50 AM   cc: Stacie Glaze, MD and The Patient   PATIENT NAME:  Alison Brooks, Alison Brooks MR#: [id number]

## 2013-06-21 NOTE — Progress Notes (Signed)
Lidocaine-40mg IV prior to Propofol InductionPropofol given over incremental dosages 

## 2013-06-21 NOTE — Progress Notes (Signed)
Called to room to assist during endoscopic procedure.  Patient ID and intended procedure confirmed with present staff. Received instructions for my participation in the procedure from the performing physician.  

## 2013-06-22 ENCOUNTER — Telehealth: Payer: Self-pay | Admitting: *Deleted

## 2013-06-22 NOTE — Telephone Encounter (Signed)
  Follow up Call-  Call back number 06/21/2013  Post procedure Call Back phone  # [phone]  Permission to leave phone message Yes     Patient questions:  Do you have a fever, pain , or abdominal swelling? no Pain Score  0 *  Have you tolerated food without any problems? yes  Have you been able to return to your normal activities? yes  Do you have any questions about your discharge instructions: Diet   no Medications  no Follow up visit  no  Do you have questions or concerns about your Care? no  Actions: * If pain score is 4 or above: No action needed, pain <4.

## 2013-06-29 ENCOUNTER — Encounter: Payer: Self-pay | Admitting: Internal Medicine

## 2013-07-27 ENCOUNTER — Ambulatory Visit: Payer: Medicare Other | Admitting: Internal Medicine

## 2013-08-16 ENCOUNTER — Telehealth: Payer: Self-pay | Admitting: Internal Medicine

## 2013-08-16 NOTE — Telephone Encounter (Signed)
Pt stated she talk with MD and he said she had gout in her foot not neuropathy. Pt is having a flare up. Pt would like med call into [facility]

## 2013-08-17 ENCOUNTER — Other Ambulatory Visit: Payer: Self-pay | Admitting: *Deleted

## 2013-08-17 MED ORDER — COLCHICINE 0.6 MG PO TABS: 0.6000 mg | ORAL_TABLET | Freq: Two times a day (BID) | ORAL | Status: DC

## 2013-08-17 NOTE — Telephone Encounter (Signed)
Patient Information:  Caller Name: [name]  Phone: [phone]  Patient: Alison Brooks, Alison Brooks  Gender: Female  DOB: 05-Jun-1956  Age: 57 Years  PCP: Darryll Capers (Adults only)  Office Follow Up:  Does the office need to follow up with this patient?: Yes  Instructions For The Office: Please see sxs below, pt left message with office yesterday and no return call.  Requesting medicaiton for gout.    Pt also would like her choice pharmacy changed from [facility] to [facility].   Symptoms  Reason For Call & Symptoms: 08/16/13 Left foot, great toe and 2nd toe, red, warm to touch and throbbing, no swelling, possible numbness but pt states she has neuropathy also, afebrile. Rates pain 6-/10 at this time.  When attempts to walk, the toes do not want to bend. Pt has discussed with Dr Lovell Sheehan previously and thinking it is possibly gout.   Pt is requesting medication.  ** Please change choice pharmacy to [facility] in [place]  (pt does not want [facility])**  Reviewed Health History In EMR: Yes  Reviewed Medications In EMR: Yes  Reviewed Allergies In EMR: Yes  Reviewed Surgeries / Procedures: Yes  Date of Onset of Symptoms: 08/16/2013  Guideline(s) Used:  Toe Pain  Disposition Per Guideline:   See Today in Office  Reason For Disposition Reached:   Numbness in one foot (i.e., loss of sensation)  Advice Given:  N/A  Patient Refused Recommendation:  Patient Requests Prescription  Requesting gout medication

## 2013-08-17 NOTE — Telephone Encounter (Signed)
Rx sent and patient is aware. 

## 2013-08-17 NOTE — Telephone Encounter (Signed)
May have colcrys # 30 1 bid prn

## 2013-08-30 ENCOUNTER — Other Ambulatory Visit: Payer: Self-pay

## 2013-09-03 ENCOUNTER — Encounter: Payer: Self-pay | Admitting: Internal Medicine

## 2013-09-03 ENCOUNTER — Other Ambulatory Visit: Payer: Self-pay | Admitting: *Deleted

## 2013-09-03 ENCOUNTER — Ambulatory Visit (INDEPENDENT_AMBULATORY_CARE_PROVIDER_SITE_OTHER): Payer: Medicare Other | Admitting: Internal Medicine

## 2013-09-03 VITALS — BP 160/90 | HR 76 | Temp 98.2°F | Resp 16 | Ht 63.0 in | Wt 171.0 lb

## 2013-09-03 DIAGNOSIS — E785 Hyperlipidemia, unspecified: Secondary | ICD-10-CM

## 2013-09-03 DIAGNOSIS — Z23 Encounter for immunization: Secondary | ICD-10-CM

## 2013-09-03 DIAGNOSIS — F988 Other specified behavioral and emotional disorders with onset usually occurring in childhood and adolescence: Secondary | ICD-10-CM

## 2013-09-03 DIAGNOSIS — M549 Dorsalgia, unspecified: Secondary | ICD-10-CM

## 2013-09-03 DIAGNOSIS — K219 Gastro-esophageal reflux disease without esophagitis: Secondary | ICD-10-CM

## 2013-09-03 LAB — LIPID PANEL
Cholesterol: 278 mg/dL — ABNORMAL HIGH (ref 0–200)
HDL: 55.8 mg/dL (ref >39.00)
Total CHOL/HDL Ratio: 5
Triglycerides: 121 mg/dL (ref 0.0–149.0)
VLDL: 24.2 mg/dL (ref 0.0–40.0)

## 2013-09-03 MED ORDER — ESOMEPRAZOLE MAGNESIUM 40 MG PO CPDR: 40.0000 mg | DELAYED_RELEASE_CAPSULE | Freq: Every day | ORAL | Status: DC

## 2013-09-03 MED ORDER — FLUOXETINE HCL 40 MG PO CAPS: 40.0000 mg | ORAL_CAPSULE | Freq: Every day | ORAL | Status: DC

## 2013-09-03 MED ORDER — AMPHETAMINE-DEXTROAMPHET ER 20 MG PO CP24: 20.0000 mg | ORAL_CAPSULE | ORAL | Status: DC

## 2013-09-03 MED ORDER — TOPIRAMATE 200 MG PO TABS: 200.0000 mg | ORAL_TABLET | Freq: Every day | ORAL | Status: DC

## 2013-09-03 MED ORDER — COLCHICINE 0.6 MG PO TABS: 0.6000 mg | ORAL_TABLET | Freq: Two times a day (BID) | ORAL | Status: DC

## 2013-09-03 MED ORDER — METHADONE HCL 5 MG PO TABS: 5.0000 mg | ORAL_TABLET | Freq: Three times a day (TID) | ORAL | Status: DC | PRN

## 2013-09-03 NOTE — Progress Notes (Signed)
Subjective:    Patient ID: Alison Brooks, female    DOB: [birth year], 57 y.o.   MRN: [id number]  HPI Patient is a 57 year old female who is followed for chronic back pain on methadone who has struggled with weight and its impact on her blood pressure.  She had been on phentermine without success and he due to the nature of her depression we discussed the use of a long-acting Adderall as both an adjuvant for depression and for weight loss.   Review of Systems  Constitutional: Negative for activity change, appetite change and fatigue.  HENT: Negative for congestion, ear pain, postnasal drip and sinus pressure.   Eyes: Negative for redness and visual disturbance.  Respiratory: Negative for cough, shortness of breath and wheezing.   Gastrointestinal: Negative for abdominal pain and abdominal distention.  Genitourinary: Negative for dysuria, frequency and menstrual problem.  Musculoskeletal: Negative for arthralgias, joint swelling, myalgias and neck pain.  Skin: Negative for rash and wound.  Neurological: Negative for dizziness, weakness and headaches.  Hematological: Negative for adenopathy. Does not bruise/bleed easily.  Psychiatric/Behavioral: Negative for sleep disturbance and decreased concentration.   Past Medical History  Diagnosis Date  . Fibromyalgia   . Chronic back pain   . Migraine   . Depression   . GERD (gastroesophageal reflux disease)     History   Social History  . Marital Status: Married    Spouse Name: N/A    Number of Children: N/A  . Years of Education: N/A   Occupational History  . Not on file.   Social History Main Topics  . Smoking status: Former Smoker    Quit date: 06/07/2008  . Smokeless tobacco: Never Used     Comment: quit in 2009  . Alcohol Use: Yes     Comment: seldom  . Drug Use: No  . Sexual Activity: Yes   Other Topics Concern  . Not on file   Social History Narrative  . No narrative on file    Past Surgical History   Procedure Laterality Date  . Lumbar fusion    . Carpal tunnel release    . Cholecystectomy  1995  . Abdominal hysterectomy    . Tonsillectomy  1964  . Back surgery    . Hemorrhoid surgery    . Rotator cuff repair Left 2004  . Cervical fusion  1996    Family History  Problem Relation Age of Onset  . Heart attack    . Hypertension Mother   . Heart disease Father   . Colon cancer Maternal Grandmother     not sure age of onset    Allergies  Allergen Reactions  . Latex     blisters    Current Outpatient Prescriptions on File Prior to Visit  Medication Sig Dispense Refill  . cyanocobalamin (,VITAMIN B-12,) 1000 MCG/ML injection Inject 1 mL (1,000 mcg total) into the muscle every 30 (thirty) days.  10 mL  1   No current facility-administered medications on file prior to visit.    BP 160/90  Pulse 76  Temp(Src) 98.2 F (36.8 C)  Resp 16  Ht 5\' 3"  (1.6 m)  Wt 171 lb (77.565 kg)  BMI 30.30 kg/m2       Objective:   Physical Exam  Constitutional: She is oriented to person, place, and time. She appears well-developed and well-nourished. No distress.  HENT:  Head: Normocephalic and atraumatic.  Right Ear: External ear normal.  Eyes: Conjunctivae and EOM are normal. Pupils are  equal, round, and reactive to light.  Neck: Normal range of motion. Neck supple. No JVD present. No tracheal deviation present. No thyromegaly present.  Cardiovascular: Normal rate and regular rhythm.   No murmur heard. Pulmonary/Chest: Effort normal and breath sounds normal. She has no wheezes. She exhibits no tenderness.  Abdominal: Soft. Bowel sounds are normal.  Musculoskeletal: Normal range of motion. She exhibits no edema and no tenderness.  Lymphadenopathy:    She has no cervical adenopathy.  Neurological: She is alert and oriented to person, place, and time. She has normal reflexes. No cranial nerve deficit.  Skin: Skin is warm and dry. She is not diaphoretic.  Psychiatric: She has a  normal mood and affect. Her behavior is normal.          Assessment & Plan:  Chronic pain medicine due per protocol with refill every 90 days..  Stable depression on Prozac.  ADD with weight gain trial of Adderall long-acting

## 2013-10-10 ENCOUNTER — Telehealth: Payer: Self-pay | Admitting: Internal Medicine

## 2013-10-10 ENCOUNTER — Ambulatory Visit (INDEPENDENT_AMBULATORY_CARE_PROVIDER_SITE_OTHER)
Admission: RE | Admit: 2013-10-10 | Discharge: 2013-10-10 | Disposition: A | Discharge status: Home / Self Care | Payer: Medicare Other | Source: Ambulatory Visit | Attending: Internal Medicine | Admitting: Internal Medicine

## 2013-10-10 ENCOUNTER — Other Ambulatory Visit: Payer: Self-pay | Admitting: *Deleted

## 2013-10-10 DIAGNOSIS — R079 Chest pain, unspecified: Secondary | ICD-10-CM

## 2013-10-10 DIAGNOSIS — R0602 Shortness of breath: Secondary | ICD-10-CM

## 2013-10-10 MED ORDER — IOHEXOL 350 MG/ML SOLN
100.0000 mL | Freq: Once | INTRAVENOUS | Status: AC | PRN
  Administered 2013-10-10: 80 mL via INTRAVENOUS

## 2013-10-10 NOTE — Telephone Encounter (Signed)
Patient Information:  Caller Name: Augustina  Phone: [phone]  Patient: Alison Brooks  Gender: Female  DOB: May 17, 1956  Age: 57 Years  PCP: Darryll Capers (Adults only)  Office Follow Up:  Does the office need to follow up with this patient?: Yes  Instructions For The Office: Please contact patient to give further instructions.  RN Note:  Patient would like to know if she can come into the office to be seen today. Please contact patient to let her know if she can have an office visit vs going to ED for evaluation of symptoms.  Symptoms  Reason For Call & Symptoms: Left sided lung pain, chest pain with breathing. Reports this feels sore and pain radiates to the back left side. Has been happening off and on since the summer.  Reviewed Health History In EMR: Yes  Reviewed Medications In EMR: Yes  Reviewed Allergies In EMR: Yes  Reviewed Surgeries / Procedures: Yes  Date of Onset of Symptoms: 06/06/2013  Treatments Tried: Heating pads, Tylenol, Ibuprofen  Treatments Tried Worked: No  Guideline(s) Used:  Chest Pain  Disposition Per Guideline:   Go to ED Now  Reason For Disposition Reached:   Severe chest pain  Advice Given:  N/A  Patient Refused Recommendation:  Patient Refused Care Advice  Would like to see MD in office if possible.

## 2013-10-10 NOTE — Telephone Encounter (Signed)
Per dr Lovell Sheehan- get chest ct per pe protocol

## 2013-10-11 ENCOUNTER — Telehealth: Payer: Self-pay | Admitting: Internal Medicine

## 2013-10-11 NOTE — Telephone Encounter (Signed)
Patient Information:  Caller Name: Alison Brooks  Phone: [phone]  Patient: Alison Brooks, Alison Brooks  Gender: Female  DOB: [birth year]  Age: 57 Years  PCP: Darryll Capers (Adults only)  Office Follow Up:  Does the office need to follow up with this patient?: Yes  Instructions For The Office: Please contact regarding CT results and findings and plan for follow up  RN Note:  Patient is calling for requesting CT scan report from 10/11/13.  Reviewed EPIC.  Please contact patient with information and plan .  She states today she feels some better but would like follow up with Dr. Lovell Sheehan.  Symptoms  Reason For Call & Symptoms: Patient states she had CT scan yesterday 10/11/13.   She has been having shortness of breath and pressure in mid chest into her back for the last 3-4 months.   She is calling for the results of the testing or what the plan is.  Reviewed Health History In EMR: Yes  Reviewed Medications In EMR: Yes  Reviewed Allergies In EMR: Yes  Reviewed Surgeries / Procedures: Yes  Date of Onset of Symptoms: 10/11/2013  Guideline(s) Used:  No Protocol Available - Information Only  Disposition Per Guideline:   Discuss with PCP and Callback by Nurse Today  Reason For Disposition Reached:   Nursing judgment  Advice Given:  N/A  RN Overrode Recommendation:  Follow Up With Office Later  Please contact regarding CT findings and plan

## 2013-10-12 ENCOUNTER — Other Ambulatory Visit: Payer: Self-pay | Admitting: *Deleted

## 2013-10-12 DIAGNOSIS — R079 Chest pain, unspecified: Secondary | ICD-10-CM

## 2013-10-12 DIAGNOSIS — R0602 Shortness of breath: Secondary | ICD-10-CM

## 2013-10-12 NOTE — Telephone Encounter (Signed)
Pt informed of nodule a nd per dr Lovell Sheehan schedule for cardiolyte

## 2013-10-24 ENCOUNTER — Ambulatory Visit (INDEPENDENT_AMBULATORY_CARE_PROVIDER_SITE_OTHER): Payer: Medicare Other | Admitting: Internal Medicine

## 2013-10-24 DIAGNOSIS — R079 Chest pain, unspecified: Secondary | ICD-10-CM

## 2013-10-26 ENCOUNTER — Other Ambulatory Visit: Payer: Self-pay | Admitting: *Deleted

## 2013-10-26 DIAGNOSIS — R079 Chest pain, unspecified: Secondary | ICD-10-CM

## 2013-10-26 DIAGNOSIS — R0602 Shortness of breath: Secondary | ICD-10-CM

## 2013-10-30 ENCOUNTER — Encounter: Payer: Self-pay | Admitting: Family Medicine

## 2013-10-30 ENCOUNTER — Ambulatory Visit (INDEPENDENT_AMBULATORY_CARE_PROVIDER_SITE_OTHER): Payer: Medicare Other | Admitting: Family Medicine

## 2013-10-30 ENCOUNTER — Encounter: Payer: Self-pay | Admitting: Cardiology

## 2013-10-30 VITALS — BP 150/70 | HR 79 | Temp 98.0°F | Wt 178.0 lb

## 2013-10-30 DIAGNOSIS — M7711 Lateral epicondylitis, right elbow: Secondary | ICD-10-CM

## 2013-10-30 DIAGNOSIS — M7710 Lateral epicondylitis, unspecified elbow: Secondary | ICD-10-CM

## 2013-10-30 MED ORDER — DICLOFENAC SODIUM 75 MG PO TBEC: 75.0000 mg | DELAYED_RELEASE_TABLET | Freq: Two times a day (BID) | ORAL | Status: DC

## 2013-10-30 NOTE — Progress Notes (Signed)
   Subjective:    Patient ID: Alison Brooks, female    DOB: [birth year], 58 y.o.   MRN: [id number]  HPI Here for one month of pain in the right elbow. No trauma. Using heat and Advil.    Review of Systems  Constitutional: Negative.   Musculoskeletal: Positive for arthralgias. Negative for joint swelling.       Objective:   Physical Exam  Constitutional: She appears well-developed and well-nourished.  Musculoskeletal:  Tender over the right lateral epicondyle, no swelling, full ROM           Assessment & Plan:  Switch to ice and add Diclofenac. Recheck prn

## 2013-10-30 NOTE — Progress Notes (Signed)
Pre visit review using our clinic review tool, if applicable. No additional management support is needed unless otherwise documented below in the visit note. 

## 2013-11-01 ENCOUNTER — Encounter (HOSPITAL_COMMUNITY): Payer: Medicare Other

## 2013-11-21 ENCOUNTER — Ambulatory Visit (INDEPENDENT_AMBULATORY_CARE_PROVIDER_SITE_OTHER): Payer: Medicare Other | Admitting: Nurse Practitioner

## 2013-11-21 ENCOUNTER — Encounter: Payer: Self-pay | Admitting: Nurse Practitioner

## 2013-11-21 VITALS — BP 141/84 | HR 84

## 2013-11-21 DIAGNOSIS — R0602 Shortness of breath: Secondary | ICD-10-CM

## 2013-11-21 DIAGNOSIS — R079 Chest pain, unspecified: Secondary | ICD-10-CM

## 2013-11-21 NOTE — Progress Notes (Signed)
Exercise Treadmill Test  Pre-Exercise Testing Evaluation Rhythm: normal sinus  Rate: 73 bpm     Test  Exercise Tolerance Test Ordering MD: Fransico Him, MD  Interpreting MD: Truitt Merle, NP  Unique Test No: 1  Treadmill:  1  Indication for ETT: chest pain - rule out ischemia/sob  Contraindication to ETT: No   Stress Modality: exercise - treadmill  Cardiac Imaging Performed: non   Protocol: standard Bruce - maximal  Max BP:  209/93  Max MPHR (bpm):  161 85% MPR (bpm):  137  MPHR obtained (bpm):  153 % MPHR obtained:  96%  Reached 85% MPHR (min:sec):  3:52 Total Exercise Time (min-sec):  5:30  Workload in METS:  7.0 Borg Scale: 17  Reason ETT Terminated:  patient's desire to stop    ST Segment Analysis At Rest: normal ST segments - no evidence of significant ST depression With Exercise: no evidence of significant ST depression  Other Information Arrhythmia:  No Angina during ETT:  absent (0) Quality of ETT:  diagnostic  ETT Interpretation:  normal - no evidence of ischemia by ST analysis  Comments: Patient presents today for routine GXT. Has had multiple somatic complaints - with chest pain, neck pain, palpitations, dyspnea. Reports elevated BP and HLD (not treated)  Today the patient exercised on the standard Bruce protocol for a total of 5:30 minutes.  Reduced exercise tolerance.  Mildly hypertensive blood pressure response.  Clinically negative for chest pain. Test was stopped due to fatigue/dyspnea and achievement of target HR.  EKG negative for ischemia. No significant arrhythmia noted. Rare PVC noted.   Recommendations: CV risk factor modification  I have asked her to monitor her BP at home.   See back prn.or if symptoms persist.  Patient is agreeable to this plan and will call if any problems develop in the interim.   Burtis Junes, RN, [facility] [address] [place],   [address] [phone]

## 2013-11-28 ENCOUNTER — Telehealth: Payer: Self-pay | Admitting: Internal Medicine

## 2013-11-28 NOTE — Telephone Encounter (Signed)
Read results- left report on desk for dr Arnoldo Morale to discuss

## 2013-11-28 NOTE — Telephone Encounter (Signed)
Pt would like stress test results. Pt had test on 11-23-13

## 2013-11-30 ENCOUNTER — Other Ambulatory Visit: Payer: Self-pay | Admitting: *Deleted

## 2013-11-30 MED ORDER — LISINOPRIL 10 MG PO TABS: 10.0000 mg | ORAL_TABLET | Freq: Every day | ORAL | Status: DC

## 2013-11-30 MED ORDER — ATORVASTATIN CALCIUM 10 MG PO TABS: 10.0000 mg | ORAL_TABLET | Freq: Every day | ORAL | Status: DC

## 2013-11-30 NOTE — Telephone Encounter (Signed)
Dr Arnoldo Morale read gxt- rx pt with lisinopril 10 qd and atorvastatin 10 qd. o Pt informed

## 2014-01-02 ENCOUNTER — Ambulatory Visit (HOSPITAL_COMMUNITY)
Admission: RE | Admit: 2014-01-02 | Discharge: 2014-01-02 | Disposition: A | Discharge status: Home / Self Care | Payer: Medicare Other | Source: Ambulatory Visit | Attending: Orthopedic Surgery | Admitting: Orthopedic Surgery

## 2014-01-02 DIAGNOSIS — M6281 Muscle weakness (generalized): Secondary | ICD-10-CM | POA: Insufficient documentation

## 2014-01-02 DIAGNOSIS — M25529 Pain in unspecified elbow: Secondary | ICD-10-CM | POA: Insufficient documentation

## 2014-01-02 DIAGNOSIS — M25539 Pain in unspecified wrist: Secondary | ICD-10-CM

## 2014-01-02 DIAGNOSIS — M7710 Lateral epicondylitis, unspecified elbow: Secondary | ICD-10-CM

## 2014-01-02 DIAGNOSIS — IMO0001 Reserved for inherently not codable concepts without codable children: Secondary | ICD-10-CM | POA: Insufficient documentation

## 2014-01-02 NOTE — Evaluation (Signed)
Occupational Therapy Evaluation  Patient Details  Name: Alison Brooks MRN: [id number] Date of Birth: 09/22/1956  Today's Date: 01/02/2014 Time: [phone] OT Time Calculation (min): 24 min OT eval [phone] 24'  Visit#: 1 of 5  Re-eval: 01/30/14  Assessment Diagnosis: Right tennis elbow Next MD Visit: call in 2 months  Authorization: Medicare  Authorization Time Period: before 10th visit   Authorization Visit#: 1 of 10   Past Medical History:  Past Medical History  Diagnosis Date  . Fibromyalgia   . Chronic back pain   . Migraine   . Depression   . GERD (gastroesophageal reflux disease)    Past Surgical History:  Past Surgical History  Procedure Laterality Date  . Lumbar fusion    . Carpal tunnel release    . Cholecystectomy  1995  . Abdominal hysterectomy    . Tonsillectomy  1964  . Back surgery    . Hemorrhoid surgery    . Rotator cuff repair Left 2004  . Cervical fusion  1996    Subjective Symptoms/Limitations Symptoms: S: Today is actually a good day for my elbow.  Pertinent History: patient began to experience right elbow pain approximaitely 5-6 months ago and began to get progressively painful. 2 weeks ago, patient received a cortizone shot in right elbow that has helped decrease the pain level. Patient continues to experiencing difficulty completing daily and leisure activities. Dr. Percell Miller has referred patient to occupational therapy for evaluation and treatment.  Limitations: cleaning, lifiting coffee pot, repetitive reaching outward actions Patient Stated Goals: To have decreased pain and increase strength. Pain Assessment Currently in Pain?: Yes Pain Score: 3  Pain Location: Elbow Pain Orientation: Right Pain Type: Acute pain Pain Onset: More than a month ago Pain Frequency: Occasional Pain Relieving Factors: anti-inflammatory, ice  Precautions/Restrictions  Precautions Precautions: None Restrictions Weight Bearing Restrictions: No  Balance  Screening Balance Screen Has the patient fallen in the past 6 months: No  Prior [place] expects to be discharged to:: Private residence Living Arrangements: Spouse/significant other Prior Function Level of Independence: Independent with basic ADLs;Independent with transfers Driving: Yes Vocation: Retired Biomedical scientist: Nursing Leisure: Hobbies-yes (Comment) Comments: car show , music and dancing  Assessment ADL/Vision/Perception ADL ADL Comments: unable to lift full coffee pot, repetitive reaching out for things, cleaning, dishing (lifting pots and dishes) Dominant Hand: Right Vision - History Baseline Vision: No visual deficits  Cognition/Observation Cognition Overall Cognitive Status: Within Functional Limits for tasks assessed Arousal/Alertness: Awake/alert Orientation Level: Oriented X4  Sensation/Coordination/Edema Edema Edema: None   Additional Assessments RUE AROM (degrees) Right Elbow Flexion: 50 Right Elbow Extension:  (+5) Right Forearm Pronation: 90 Degrees Right Forearm Supination: 90 Degrees RUE Strength Right Elbow Flexion: 3+/5 Right Elbow Extension: 3+/5 Right Forearm Pronation:  (4-/5) Right Forearm Supination:  (4-/5) Grip (lbs): 50 Lateral Pinch: 14 lbs 3 Point Pinch: 12 lbs LUE Strength Left Elbow Flexion: 5/5 Left Elbow Extension: 5/5 Left Forearm Pronation: 5/5 Left Forearm Supination: 5/5 Grip (lbs): 60 Lateral Pinch: 16 lbs 3 Point Pinch: 14 lbs Palpation Palpation: Moderate fascial restrictions in right lateral region of right elbow    Occupational Therapy Assessment and Plan OT Assessment and Plan Clinical Impression Statement: A: Patient is a 58 year old female presenting to occupational therapy with right lateral epicondylitis which is causing difficulty completing B/IADL activities.  Pt will benefit from skilled therapeutic intervention in order to improve on the following deficits:  Increased fascial restricitons;Pain;Decreased strength Rehab Potential: Excellent OT Frequency:  Min 1X/week OT Duration: 4 weeks OT Treatment/Interventions: Self-care/ADL training;Therapeutic activities;Therapeutic exercise;Patient/family education;Manual therapy;Modalities OT Plan: P:  Skilled OT intervention to decrease pain and fascial restrictions and improve pain free mobiilty and strength in right elbow needed to return to prior level of independence wtih all B/IADLs, work, and leisure activities. Treatment Plan:  MFR and manual stretching, AROM, strengthening, ice massage, progress as tolerated.     Goals Short Term Goals Time to Complete Short Term Goals: 2 weeks Short Term Goal 1: Patient will be educated on HEP. Short Term Goal 2: Patient will increase Right elbow strength to 4-/5 to increase ability to lift heavy pots and dishes. Short Term Goal 3: Patient will complete cleaning tasks with a pain level of 4/10 or less.  Short Term Goal 4: Fascial restrictions with decrease to min to increase ability to complete daily tasks.  Long Term Goals Time to Complete Long Term Goals: 4 weeks Long Term Goal 1: Patient will return to highest level of independence with all I/BADL actvities.  Long Term Goal 2: Patient will increase right elbow strength to 5/5 to increase ability to lift full pot of coffee with RUE. Long Term Goal 3: Patient will complete daily tasks with a pain level of 1/10 or less.  Long Term Goal 4: Fascial restrictions will decrease to trace to increase ability to complete daily tasks.   Problem List Patient Active Problem List   Diagnosis Date Noted  . Muscle weakness (generalized) 01/02/2014  . Pain in joint, forearm 01/02/2014  . Lateral epicondylitis (tennis elbow) 01/02/2014  . GERD (gastroesophageal reflux disease) 03/26/2013  . LEG CRAMPS 05/16/2009  . RESTLESS LEG SYNDROME, SEVERE 03/14/2009  . CHRONIC INTERSTITIAL CYSTITIS 01/20/2009  . 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 DEPRESSION 03/19/2008  . TUBULOVILLOUS ADENOMA, COLON 02/17/2008  . HYPERLIPIDEMIA 02/17/2008  . HEMORRHOID, THROMBOSED 02/17/2008  . NEPHROLITHIASIS 02/17/2008  . MENOPAUSE, SURGICAL 02/17/2008  . CHOLECYSTECTOMY, LAPAROSCOPIC, HX OF 02/17/2008  . HEMORRHOIDECTOMY, HX OF 02/17/2008  . TOBACCO USE 12/21/2007  . MIGRAINE, CLASSICAL W/O INTRACTABLE MIGRAINE 06/09/2007  . SYMPTOM, MEMORY LOSS 06/09/2007  . BACK PAIN, CHRONIC 04/21/2007  . FIBROMYALGIA 04/21/2007    End of Session Activity Tolerance: Patient tolerated treatment well General Behavior During Therapy: [facility] for tasks assessed/performed OT Plan of Care OT Home Exercise Plan: Elbow stretches and strengthening exercises OT Patient Instructions: handout- scanned Consulted and Agree with Plan of Care: Patient  GO Functional Assessment Tool Used: FOTO 36/100 Functional Limitation: Carrying, moving and handling objects Carrying, Moving and Handling Objects Current Status (H8299): At least 60 percent but less than 80 percent impaired, limited or restricted Carrying, Moving and Handling Objects Goal Status [phone]): At least 20 percent but less than 40 percent impaired, limited or restricted  Ailene Ravel, OTR/L,CBIS   01/02/2014, 11:53 AM  Physician Documentation Your signature is required to indicate approval of the treatment plan as stated above.  Please sign and either send electronically or make a copy of this report for your files and return this physician signed original.  Please mark one 1.__approve of plan  2. ___approve of plan with the following conditions.   ______________________________  _____________________ Physician Signature                                                                                                             Date

## 2014-01-09 ENCOUNTER — Telehealth (HOSPITAL_COMMUNITY): Payer: Self-pay

## 2014-01-10 ENCOUNTER — Ambulatory Visit (HOSPITAL_COMMUNITY): Payer: Medicare Other

## 2014-01-14 ENCOUNTER — Encounter: Payer: Self-pay | Admitting: Internal Medicine

## 2014-01-14 ENCOUNTER — Ambulatory Visit (INDEPENDENT_AMBULATORY_CARE_PROVIDER_SITE_OTHER): Payer: Medicare Other | Admitting: Internal Medicine

## 2014-01-14 VITALS — BP 140/90 | HR 102 | Temp 97.0°F | Ht 63.0 in | Wt 171.0 lb

## 2014-01-14 DIAGNOSIS — F988 Other specified behavioral and emotional disorders with onset usually occurring in childhood and adolescence: Secondary | ICD-10-CM

## 2014-01-14 DIAGNOSIS — E669 Obesity, unspecified: Secondary | ICD-10-CM | POA: Insufficient documentation

## 2014-01-14 DIAGNOSIS — K219 Gastro-esophageal reflux disease without esophagitis: Secondary | ICD-10-CM

## 2014-01-14 DIAGNOSIS — M549 Dorsalgia, unspecified: Secondary | ICD-10-CM

## 2014-01-14 MED ORDER — LISINOPRIL 10 MG PO TABS: 10.0000 mg | ORAL_TABLET | Freq: Every day | ORAL | Status: DC

## 2014-01-14 MED ORDER — AMPHETAMINE-DEXTROAMPHET ER 20 MG PO CP24: 20.0000 mg | ORAL_CAPSULE | ORAL | Status: DC

## 2014-01-14 MED ORDER — TOPIRAMATE 200 MG PO TABS: 200.0000 mg | ORAL_TABLET | Freq: Every day | ORAL | Status: DC

## 2014-01-14 MED ORDER — ATORVASTATIN CALCIUM 10 MG PO TABS: 10.0000 mg | ORAL_TABLET | Freq: Every day | ORAL | Status: DC

## 2014-01-14 MED ORDER — METHADONE HCL 5 MG PO TABS: 5.0000 mg | ORAL_TABLET | Freq: Three times a day (TID) | ORAL | Status: DC | PRN

## 2014-01-14 MED ORDER — PROPRANOLOL HCL 10 MG PO TABS: 10.0000 mg | ORAL_TABLET | Freq: Three times a day (TID) | ORAL | Status: DC | PRN

## 2014-01-14 MED ORDER — ESOMEPRAZOLE MAGNESIUM 40 MG PO CPDR: 40.0000 mg | DELAYED_RELEASE_CAPSULE | Freq: Every day | ORAL | Status: DC

## 2014-01-14 MED ORDER — FLUOXETINE HCL 40 MG PO CAPS: 40.0000 mg | ORAL_CAPSULE | Freq: Every day | ORAL | Status: DC

## 2014-01-14 NOTE — Progress Notes (Signed)
Pre visit review using our clinic review tool, if applicable. No additional management support is needed unless otherwise documented below in the visit note. 

## 2014-01-14 NOTE — Progress Notes (Signed)
Subjective:    Patient ID: Alison Brooks, female    DOB: April 01, 1956, 58 y.o.   MRN: [id number]  Headache  Associated symptoms include rhinorrhea and sinus pressure.    Elevated pulse and blood pressure Pain is the etiology History of chronic pain  Review of Systems  Constitutional: Positive for fatigue.  HENT: Positive for rhinorrhea and sinus pressure.   Respiratory: Positive for shortness of breath.   Endocrine: Positive for cold intolerance.  Genitourinary: Positive for urgency and frequency.  Musculoskeletal: Positive for arthralgias, myalgias and neck stiffness.  Skin: Negative.   Neurological: Positive for headaches.       Past Medical History  Diagnosis Date  . Fibromyalgia   . Chronic back pain   . Migraine   . Depression   . GERD (gastroesophageal reflux disease)     History   Social History  . Marital Status: Married    Spouse Name: N/A    Number of Children: N/A  . Years of Education: N/A   Occupational History  . Not on file.   Social History Main Topics  . Smoking status: Former Smoker    Quit date: 06/07/2008  . Smokeless tobacco: Never Used     Comment: quit in 2009  . Alcohol Use: Yes     Comment: seldom  . Drug Use: No  . Sexual Activity: Yes   Other Topics Concern  . Not on file   Social History Narrative  . No narrative on file    Past Surgical History  Procedure Laterality Date  . Lumbar fusion    . Carpal tunnel release    . Cholecystectomy  1995  . Abdominal hysterectomy    . Tonsillectomy  1964  . Back surgery    . Hemorrhoid surgery    . Rotator cuff repair Left 2004  . Cervical fusion  1996    Family History  Problem Relation Age of Onset  . Heart attack    . Hypertension Mother   . Heart disease Mother   . Heart disease Father   . Colon cancer Maternal Grandmother     not sure age of onset    Allergies  Allergen Reactions  . Latex     blisters    Current Outpatient Prescriptions on File Prior to  Visit  Medication Sig Dispense Refill  . colchicine 0.6 MG tablet Take 1 tablet (0.6 mg total) by mouth 2 (two) times daily.  60 tablet  3  . cyanocobalamin (,VITAMIN B-12,) 1000 MCG/ML injection Inject 1 mL (1,000 mcg total) into the muscle every 30 (thirty) days.  10 mL  1  . diclofenac (VOLTAREN) 75 MG EC tablet Take 1 tablet (75 mg total) by mouth 2 (two) times daily.  60 tablet  2   No current facility-administered medications on file prior to visit.    BP 140/90  Pulse 102  Temp(Src) 97 F (36.1 C) (Oral)  Ht 5\' 3"  (1.6 m)  Wt 171 lb (77.565 kg)  BMI 30.30 kg/m2  SpO2 97%    Objective:   Physical Exam  Nursing note and vitals reviewed. Constitutional: She is oriented to person, place, and time. She appears well-developed and well-nourished.  HENT:  Head: Normocephalic and atraumatic.  Eyes: Conjunctivae are normal. Pupils are equal, round, and reactive to light.  Neck: Neck supple.  Cardiovascular: Exam reveals gallop.   Pulmonary/Chest: Breath sounds normal.  Abdominal: Bowel sounds are normal.  Neurological: She is alert and oriented to person, place,  and time.  Skin: Skin is warm and dry.  Psychiatric: She has a normal mood and affect. Her behavior is normal.          Assessment & Plan:  Labile HTN that does not seem to be related to the adderal ( has heal the medication for 2 months) Chronic pain and add management protocols Add propranolol 10 mg prn rapid pulse and monitor Weight  loss

## 2014-01-14 NOTE — Patient Instructions (Signed)
The patient is instructed to continue all medications as prescribed. Schedule followup with check out clerk upon leaving the clinic  

## 2014-01-17 ENCOUNTER — Ambulatory Visit (HOSPITAL_COMMUNITY): Payer: Medicare Other

## 2014-01-24 ENCOUNTER — Ambulatory Visit (HOSPITAL_COMMUNITY): Payer: Medicare Other | Admitting: Specialist

## 2014-01-30 ENCOUNTER — Ambulatory Visit (HOSPITAL_COMMUNITY): Payer: Medicare Other

## 2014-03-11 ENCOUNTER — Telehealth: Payer: Self-pay | Admitting: Internal Medicine

## 2014-03-11 MED ORDER — BENZONATATE 200 MG PO CAPS: 200.0000 mg | ORAL_CAPSULE | Freq: Three times a day (TID) | ORAL | Status: DC | PRN

## 2014-03-11 NOTE — Telephone Encounter (Signed)
Pt called to ask if something can be called in for a cough and phlegm

## 2014-03-11 NOTE — Telephone Encounter (Signed)
rx sent to pharmacy and patient is aware. 

## 2014-03-11 NOTE — Telephone Encounter (Signed)
May call in tessilon perles 200 tid numer 20

## 2014-03-14 NOTE — Progress Notes (Signed)
  Discharge Note  Patient Details  Name: Alison Brooks MRN: [id number] Date of Birth: 1956/08/05  Today's Date: 03/14/2014 The above patient was discharged from OT on 03/14/2014 secondary to patient's request.. No reason was given during telephone call with office secretary.  Ailene Ravel, OTR/L,CBIS   03/14/2014, 8:35 AM

## 2014-03-14 NOTE — Addendum Note (Signed)
Encounter addended by: Debby Bud, OT on: 03/14/2014  8:36 AM<BR>     Documentation filed: Episodes, Notes Section, Letters

## 2014-03-28 ENCOUNTER — Telehealth: Payer: Self-pay

## 2014-03-28 DIAGNOSIS — R002 Palpitations: Secondary | ICD-10-CM

## 2014-03-28 DIAGNOSIS — R0602 Shortness of breath: Secondary | ICD-10-CM

## 2014-03-28 NOTE — Telephone Encounter (Signed)
This documentation was copied and pasted into pt's chart from her husband's chart.  Sherren Mocha, MD at 03/24/2014 10:12 PM    Status: Signed       Happy to see her on my next available. If not already done, would recommend an echo and holter prior to the visit if she is having daily palpitations.  Sherren Mocha  03/24/2014  10:14 PM        Lurline Del, RN at 03/22/2014 5:34 PM     Status: Signed        The pt contacted our office so that his wife could speak to me about getting an appointment with Dr Burt Knack. The pt's wife is Halimah Bewick MRN [id number]. She had a stress test 11/21/13 with Truitt Merle NP and she has continued to have issues with palpitations and SOB. She takes Inderal once a day and can take this up to three times a day as needed for palpitations. I recommended that the pt start taking this twice a day to see if her symptoms improve. The pt did try to get an appointment with her PCP but they do not have availability until August. The PCP referred her to Cardiology and she would like to see Dr Burt Knack as her husband is his pt and she has spoken with him before about her symptoms. I made her aware that I would review our schedule and contact her with an appointment (She is out of town the week on June 15th).         Vallarie Mare at 03/22/2014 4:13 PM     Status: Signed        New Message:  Pt is requesting to speak to [place]. States he will give more details when she calls

## 2014-03-28 NOTE — Telephone Encounter (Signed)
I spoke with the pt and made her aware of recommendations.  The pt's palpitations are not on a regular basis so an event monitor would be more appropriate for evaluating her palpitations.  I will place orders for these tests and schedule the pt to see Dr Burt Knack on 05/17/14 at 8:00. Pt agreed with plan.

## 2014-03-28 NOTE — Telephone Encounter (Signed)
I spoke with the pt's husband and he will have the pt contact our office to discuss plan and appointments.

## 2014-04-04 ENCOUNTER — Ambulatory Visit: Payer: Medicare Other | Admitting: Cardiovascular Disease

## 2014-04-17 ENCOUNTER — Encounter (INDEPENDENT_AMBULATORY_CARE_PROVIDER_SITE_OTHER): Payer: Medicare Other

## 2014-04-17 ENCOUNTER — Encounter: Payer: Self-pay | Admitting: *Deleted

## 2014-04-17 ENCOUNTER — Ambulatory Visit (HOSPITAL_COMMUNITY): Payer: Medicare Other | Attending: Cardiology | Admitting: Radiology

## 2014-04-17 DIAGNOSIS — E785 Hyperlipidemia, unspecified: Secondary | ICD-10-CM | POA: Insufficient documentation

## 2014-04-17 DIAGNOSIS — Z87891 Personal history of nicotine dependence: Secondary | ICD-10-CM | POA: Insufficient documentation

## 2014-04-17 DIAGNOSIS — R002 Palpitations: Secondary | ICD-10-CM

## 2014-04-17 DIAGNOSIS — R0602 Shortness of breath: Secondary | ICD-10-CM | POA: Insufficient documentation

## 2014-04-17 HISTORY — PX: TRANSTHORACIC ECHOCARDIOGRAM: SHX275

## 2014-04-17 NOTE — Progress Notes (Signed)
Patient ID: Alison Brooks, female   DOB: 1956/03/11, 58 y.o.   MRN: [id number] [employer] 30 day cardiac event monitor applied to patient.

## 2014-04-17 NOTE — Progress Notes (Signed)
Echocardiogram performed.  

## 2014-04-19 ENCOUNTER — Telehealth: Payer: Self-pay | Admitting: Internal Medicine

## 2014-04-19 NOTE — Telephone Encounter (Signed)
Pt would like to know if you think Dr Yong Channel will continue to prescribe her methodone? Any advice??    Scheduled appt w/ hunter on 9/4, but pt states she will be close to being out of the methodone about then.

## 2014-04-19 NOTE — Telephone Encounter (Signed)
It will be up to him but i suspect that he will I'll refill the next one

## 2014-04-30 MED ORDER — METHADONE HCL 5 MG PO TABS: 5.0000 mg | ORAL_TABLET | Freq: Three times a day (TID) | ORAL | Status: DC | PRN

## 2014-04-30 NOTE — Telephone Encounter (Signed)
Pt needs new rx methadone

## 2014-04-30 NOTE — Telephone Encounter (Signed)
rx will be ready for p/u on Friday afternoon, pt aware

## 2014-05-17 ENCOUNTER — Ambulatory Visit (INDEPENDENT_AMBULATORY_CARE_PROVIDER_SITE_OTHER): Payer: Medicare Other | Admitting: Cardiovascular Disease

## 2014-05-17 ENCOUNTER — Encounter: Payer: Self-pay | Admitting: Cardiovascular Disease

## 2014-05-17 VITALS — BP 152/88 | HR 98 | Ht 63.0 in | Wt 160.8 lb

## 2014-05-17 DIAGNOSIS — R002 Palpitations: Secondary | ICD-10-CM

## 2014-05-17 DIAGNOSIS — I10 Essential (primary) hypertension: Secondary | ICD-10-CM

## 2014-05-17 MED ORDER — PROPRANOLOL HCL 10 MG PO TABS: 10.0000 mg | ORAL_TABLET | Freq: Two times a day (BID) | ORAL | Status: DC

## 2014-05-17 NOTE — Progress Notes (Signed)
HPI:  58 year old woman presenting for initial cardiac evaluation. The patient has had heart palpitations, mild shortness of breath, and chest heaviness. She had an exercise treadmill study earlier this year that showed no ischemic changes. She was noted to have poor exercise capacity. The patient has not been engaged in any regular exercise. She has episodes at rest when her blood pressure increases and she feels like her "heart is pounding." She denies presyncope or syncope. Chest pain/pressure is not a primary complaint. She's had a rare episodes that have been very mild. She denies leg swelling, orthopnea, or PND. She has no personal history of heart disease. The patient is moderately limited by chronic pain. She's had multiple back surgeries. Also has had a cholecystectomy, vaginal hysterectomy, rotator cuff surgery, and neck surgery. 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   She quit smoking cigarettes over 5 years ago. She rarely drinks alcohol.  Outpatient Encounter Prescriptions as of 05/17/2014  Medication Sig  . amphetamine-dextroamphetamine (ADDERALL XR) 20 MG 24 hr capsule Take 1 capsule (20 mg total) by mouth every morning.  Marland Kitchen amphetamine-dextroamphetamine (ADDERALL XR) 20 MG 24 hr capsule Take 1 capsule (20 mg total) by mouth every morning.  Marland Kitchen atorvastatin (LIPITOR) 10 MG tablet Take 1 tablet (10 mg total) by mouth daily.  . benzonatate (TESSALON) 200 MG capsule Take 1 capsule (200 mg total) by mouth 3 (three) times daily as needed for cough.  . colchicine 0.6 MG tablet Take 1 tablet (0.6 mg total) by mouth 2 (two) times daily.  . cyanocobalamin (,VITAMIN B-12,) 1000 MCG/ML injection Inject 1 mL (1,000 mcg total) into the muscle every 30 (thirty) days.  . diclofenac (VOLTAREN) 75 MG EC tablet Take 1 tablet (75 mg total) by mouth 2 (two) times daily.  Marland Kitchen esomeprazole (NEXIUM) 40 MG capsule Take 1 capsule (40 mg total) by mouth daily.  Marland Kitchen  FLUoxetine (PROZAC) 40 MG capsule Take 1 capsule (40 mg total) by mouth daily.  Marland Kitchen lisinopril (PRINIVIL,ZESTRIL) 10 MG tablet Take 1 tablet (10 mg total) by mouth daily.  . methadone (DOLOPHINE) 5 MG tablet Take 1 tablet (5 mg total) by mouth 3 (three) times daily as needed.  . propranolol (INDERAL) 10 MG tablet Take 1 tablet (10 mg total) by mouth 3 (three) times daily as needed (elevated pulse).  . topiramate (TOPAMAX) 200 MG tablet Take 1 tablet (200 mg total) by mouth daily.  . [DISCONTINUED] methadone (DOLOPHINE) 5 MG tablet Take 1 tablet (5 mg total) by mouth 3 (three) times daily as needed.  . [DISCONTINUED] methadone (DOLOPHINE) 5 MG tablet Take 1 tablet (5 mg total) by mouth 3 (three) times daily as needed.    Allergies  Allergen Reactions  . Latex     blisters    Past Medical History  Diagnosis Date  . Fibromyalgia   . Chronic back pain   . Migraine   . Depression   . GERD (gastroesophageal reflux disease)    Family history: Patient's bother and mother both died suddenly at age of 67 and 56 respectively. Both presumably related to heart attacks.  ROS: Negative except as per HPI  BP 152/88  Pulse 98  Ht 5\' 3"  (1.6 m)  Wt 160 lb 12.8 oz (72.938 kg)  BMI 28.49 kg/m2  PHYSICAL EXAM: Pt is alert and oriented, NAD HEENT: normal Neck: JVP - normal, carotids 2+= without bruits Lungs: CTA bilaterally CV: RRR without murmur or gallop Abd: soft,  NT, Positive BS, no hepatomegaly Ext: no C/C/E, distal pulses intact and equal Skin: warm/dry no rash  EKG:  Normal sinus rhythm 98 beats per minute, within normal limits.  ASSESSMENT AND PLAN: 1. Essential hypertension. Blood pressure modestly elevated in the office today. Asked her to regularly take propranolol twice daily. This dose can be increased as needed. She will continue on lisinopril. Recommended a home blood pressure log and call in if readings are averaging greater than 140/90. Otherwise I advised that she take her  blood pressure readings in to Dr. Yong Channel when she sees him on September 4.  2. Heart palpitations. Suspect benign palpitations. She has worn an event monitor and I have reviewed her initial rhythm strips which showed no arrhythmia. Increase beta blocker as needed.  3. Exertional dyspnea. Suspect deconditioning is the primary problem. Reviewed her exercise treadmill study which was done earlier this year. She is working on increasing her activity level and symptoms are slowly improving.  For followup I will see her back in one year. She will call if blood pressure readings are elevated at home. Her cuff has been calibrated  Sherren Mocha 05/17/2014 2:05 PM

## 2014-05-17 NOTE — Patient Instructions (Signed)
Your physician recommends that you continue on your current medications as directed. Please refer to the Current Medication list given to you today.   Your physician wants you to follow-up in: 1 year with Dr. Burt Knack.  You will receive a reminder letter in the mail two months in advance. If you don't receive a letter, please call our office to schedule the follow-up appointment @ [phone].   If Blood Pressure is consistently greater or  at 140/90  Please call our office at [phone] and speak with Theodosia Quay, RN

## 2014-06-28 ENCOUNTER — Ambulatory Visit (INDEPENDENT_AMBULATORY_CARE_PROVIDER_SITE_OTHER): Payer: Medicare Other | Admitting: Family Medicine

## 2014-06-28 ENCOUNTER — Encounter: Payer: Self-pay | Admitting: Family Medicine

## 2014-06-28 VITALS — BP 152/82 | HR 96 | Temp 98.3°F | Wt 163.0 lb

## 2014-06-28 DIAGNOSIS — M109 Gout, unspecified: Secondary | ICD-10-CM | POA: Insufficient documentation

## 2014-06-28 DIAGNOSIS — R252 Cramp and spasm: Secondary | ICD-10-CM

## 2014-06-28 DIAGNOSIS — F17200 Nicotine dependence, unspecified, uncomplicated: Secondary | ICD-10-CM

## 2014-06-28 DIAGNOSIS — N3010 Interstitial cystitis (chronic) without hematuria: Secondary | ICD-10-CM

## 2014-06-28 DIAGNOSIS — R918 Other nonspecific abnormal finding of lung field: Secondary | ICD-10-CM | POA: Insufficient documentation

## 2014-06-28 DIAGNOSIS — G43109 Migraine with aura, not intractable, without status migrainosus: Secondary | ICD-10-CM

## 2014-06-28 DIAGNOSIS — M549 Dorsalgia, unspecified: Secondary | ICD-10-CM

## 2014-06-28 DIAGNOSIS — G2581 Restless legs syndrome: Secondary | ICD-10-CM

## 2014-06-28 DIAGNOSIS — F3289 Other specified depressive episodes: Secondary | ICD-10-CM

## 2014-06-28 DIAGNOSIS — E8941 Symptomatic postprocedural ovarian failure: Secondary | ICD-10-CM

## 2014-06-28 DIAGNOSIS — R413 Other amnesia: Secondary | ICD-10-CM

## 2014-06-28 DIAGNOSIS — K219 Gastro-esophageal reflux disease without esophagitis: Secondary | ICD-10-CM

## 2014-06-28 DIAGNOSIS — F333 Major depressive disorder, recurrent, severe with psychotic symptoms: Secondary | ICD-10-CM

## 2014-06-28 DIAGNOSIS — Z9889 Other specified postprocedural states: Secondary | ICD-10-CM

## 2014-06-28 DIAGNOSIS — I10 Essential (primary) hypertension: Secondary | ICD-10-CM

## 2014-06-28 DIAGNOSIS — Z9089 Acquired absence of other organs: Secondary | ICD-10-CM

## 2014-06-28 DIAGNOSIS — R911 Solitary pulmonary nodule: Secondary | ICD-10-CM

## 2014-06-28 DIAGNOSIS — E538 Deficiency of other specified B group vitamins: Secondary | ICD-10-CM | POA: Insufficient documentation

## 2014-06-28 DIAGNOSIS — D126 Benign neoplasm of colon, unspecified: Secondary | ICD-10-CM

## 2014-06-28 DIAGNOSIS — F329 Major depressive disorder, single episode, unspecified: Secondary | ICD-10-CM

## 2014-06-28 DIAGNOSIS — N200 Calculus of kidney: Secondary | ICD-10-CM

## 2014-06-28 DIAGNOSIS — M10072 Idiopathic gout, left ankle and foot: Secondary | ICD-10-CM

## 2014-06-28 DIAGNOSIS — K645 Perianal venous thrombosis: Secondary | ICD-10-CM

## 2014-06-28 DIAGNOSIS — IMO0001 Reserved for inherently not codable concepts without codable children: Secondary | ICD-10-CM

## 2014-06-28 DIAGNOSIS — E785 Hyperlipidemia, unspecified: Secondary | ICD-10-CM

## 2014-06-28 LAB — CBC
HCT: 42.4 % (ref 36.0–46.0)
Hemoglobin: 14.2 g/dL (ref 12.0–15.0)
MCHC: 33.4 g/dL (ref 30.0–36.0)
MCV: 92.9 fl (ref 78.0–100.0)
Platelets: 235 10*3/uL (ref 150.0–400.0)
RBC: 4.57 Mil/uL (ref 3.87–5.11)
RDW: 13.9 % (ref 11.5–15.5)
WBC: 9.3 10*3/uL (ref 4.0–10.5)

## 2014-06-28 LAB — COMPREHENSIVE METABOLIC PANEL
ALT: 25 U/L (ref 0–35)
AST: 29 U/L (ref 0–37)
Albumin: 3.9 g/dL (ref 3.5–5.2)
Alkaline Phosphatase: 87 U/L (ref 39–117)
BUN: 14 mg/dL (ref 6–23)
CHLORIDE: 109 meq/L (ref 96–112)
CO2: 26 mEq/L (ref 19–32)
CREATININE: 0.9 mg/dL (ref 0.4–1.2)
Calcium: 9.4 mg/dL (ref 8.4–10.5)
GFR: 68.64 mL/min (ref >60.00)
Glucose, Bld: 78 mg/dL (ref 70–99)
Potassium: 5.2 mEq/L — ABNORMAL HIGH (ref 3.5–5.1)
SODIUM: 141 meq/L (ref 135–145)
Total Bilirubin: 0.3 mg/dL (ref 0.2–1.2)
Total Protein: 7.2 g/dL (ref 6.0–8.3)

## 2014-06-28 LAB — VITAMIN B12: Vitamin B-12: 179 pg/mL — ABNORMAL LOW (ref 211–911)

## 2014-06-28 LAB — LDL CHOLESTEROL, DIRECT: LDL DIRECT: 193 mg/dL

## 2014-06-28 LAB — TSH: TSH: 5.42 u[IU]/mL — ABNORMAL HIGH (ref 0.35–4.50)

## 2014-06-28 NOTE — Assessment & Plan Note (Signed)
Discussed with patient I would prescribe methadone given clinical situation. See AVS for full details. Will do BID dosing max instead.

## 2014-06-28 NOTE — Assessment & Plan Note (Signed)
Poorly controlled on lisinopril 10mg  plus propranolol 10mg  BID. Check creatinine, if ok, increase lisinopril (likely 40mg  pills, split in half until visit)

## 2014-06-28 NOTE — Assessment & Plan Note (Signed)
Recheck in December with CT noncontrast. Patient to send me message as reminder at that time.

## 2014-06-28 NOTE — Assessment & Plan Note (Signed)
Check levels today. If adequately restored, consider trial off of medicatoin with q6 month rechecks.

## 2014-06-28 NOTE — Patient Instructions (Signed)
Labs today  Blood Pressure-check labs and if ok I will increase your lisinopril and have you back in 1-2 weeks.   Back pain-I am ok with prescribing methadone in your situation but I will need to do an updated pain contract in the future and use urine drug screens. I will need to have appointments for all refills and I will be only prescriber to prescribe. Also, would prefer twice a day dosing.   Nodule-plan for repeat scan in December

## 2014-06-28 NOTE — Progress Notes (Signed)
Garret Reddish, MD Phone: [phone]  Subjective:  Patient presents today to establish care with me as their new primary care provider and for annual physical. Patient was formerly a patient of Dr. Arnoldo Morale. Chief complaint-noted.   Hypertension BP Readings from Last 3 Encounters:  06/28/14 152/82  05/17/14 152/88  01/14/14 140/90  Home BP monitoring-no Compliant with medications-yes without side effects, lisinopril 10mg  plus propranolol 10mg  BID ROS-Denies any CP, HA, SOB, blurry vision, LE edema.   Chronic Back pain July was given #90 methadone, still has many pills left. Very rarely takes methadone 3x a day and most of the time just takes once a day. Updated overview with full history. Essentially Dr. Arnoldo Morale took over methadone prescriptions to save patient a trip to [facility] once a month.  ROS- no leg weakness or saddle anesthesia  Vitamin B12 deficiency Having trouble with insurance with home injections. Has been taking injections at home for some time ROS- no worsening paresthesias  Pulmonary nodule Noted CT angio 10/10/13 4 mm upper lobe. Repeat advised 1 year due to smoking history.  ROS- No current chest pain or shortness of breath  The following were reviewed and entered/updated in epic: Past Medical History  Diagnosis Date  . Fibromyalgia   . Chronic back pain   . Migraine   . Depression   . GERD (gastroesophageal reflux disease)    Patient Active Problem List   Diagnosis Date Noted  . 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 Gout 06/28/2014    Priority: Medium  . Vitamin B12 deficiency 06/28/2014    Priority: Medium  . Solitary pulmonary nodule 06/28/2014    Priority: Medium  . Essential hypertension 05/17/2014    Priority: Medium  . RESTLESS LEG SYNDROME, SEVERE 03/14/2009    Priority: Medium  . Chronic interstitial cystitis 01/20/2009    Priority: Medium  . DEPRESSION 03/19/2008    Priority: Medium    . HYPERLIPIDEMIA 02/17/2008    Priority: Medium  . Former smoker 12/21/2007    Priority: Medium  . FIBROMYALGIA 04/21/2007    Priority: Medium  . Obesity (BMI 30-39.9) 01/14/2014    Priority: Low  . Lateral epicondylitis (tennis elbow) 01/02/2014    Priority: Low  . GERD (gastroesophageal reflux disease) 03/26/2013    Priority: Low  . LEG CRAMPS 05/16/2009    Priority: Low  . TUBULOVILLOUS ADENOMA, COLON 02/17/2008    Priority: Low  . NEPHROLITHIASIS 02/17/2008    Priority: Low  . MIGRAINE, CLASSICAL W/O INTRACTABLE MIGRAINE 06/09/2007    Priority: Low  . SYMPTOM, MEMORY LOSS 06/09/2007    Priority: Low   Past Surgical History  Procedure Laterality Date  . Lumbar fusion    . Carpal tunnel release      right  . Cholecystectomy  1995  . Abdominal hysterectomy      fibroids, including cervix, left ovaries  . Tonsillectomy  1964  . Back surgery      multiple-see overview for back pain  . Hemorrhoid surgery    . Rotator cuff repair Left 2004  . Cervical fusion  1996    Family History  Problem Relation Age of Onset  . Heart attack Mother     Mom 74, Dad 65  . Hypertension Mother   . Heart disease Mother   . Heart disease Father   . Colon cancer Maternal Grandmother     not sure age of onset    Medications- reviewed and  updated Current Outpatient Prescriptions  Medication Sig Dispense Refill  . atorvastatin (LIPITOR) 10 MG tablet Take 1 tablet (10 mg total) by mouth daily.  90 tablet  3  . esomeprazole (NEXIUM) 40 MG capsule Take 1 capsule (40 mg total) by mouth daily.  30 capsule  6  . FLUoxetine (PROZAC) 40 MG capsule Take 1 capsule (40 mg total) by mouth daily.  30 capsule  6  . lisinopril (PRINIVIL,ZESTRIL) 10 MG tablet Take 1 tablet (10 mg total) by mouth daily.  90 tablet  3  . methadone (DOLOPHINE) 5 MG tablet Take 1 tablet (5 mg total) by mouth 3 (three) times daily as needed.  90 tablet  0  . propranolol (INDERAL) 10 MG tablet Take 1 tablet (10 mg total)  by mouth 2 (two) times daily.  30 tablet  6  . topiramate (TOPAMAX) 200 MG tablet Take 1 tablet (200 mg total) by mouth daily.  30 tablet  6  . colchicine 0.6 MG tablet Take 1 tablet (0.6 mg total) by mouth 2 (two) times daily.  60 tablet  3  . cyanocobalamin (,VITAMIN B-12,) 1000 MCG/ML injection Inject 1 mL (1,000 mcg total) into the muscle every 30 (thirty) days.  10 mL  1  . diclofenac (VOLTAREN) 75 MG EC tablet Take 1 tablet (75 mg total) by mouth 2 (two) times daily.  60 tablet  2   No current facility-administered medications for this visit.    Allergies-reviewed and updated Allergies  Allergen Reactions  . Latex     blisters    History   Social History  . Marital Status: Married    Spouse Name: N/A    Number of Children: N/A  . Years of Education: N/A   Social History Main Topics  . Smoking status: Former Smoker -- 1.25 packs/day for 40 years    Types: Cigarettes    Quit date: 06/07/2008  . Smokeless tobacco: Never Used     Comment: quit in 2009  . Alcohol Use: Yes     Comment: seldom  . Drug Use: No  . Sexual Activity: Yes   Other Topics Concern  . None   Social History Narrative   Married 43 years in December. 1 child. 2 grandkids (boy and girl). Lives 10 miles away.       Disabled.       Hobbies: previously enjoyed dancing, old car shoes (59 chevy), reading    ROS--See HPI   Objective: BP 152/82  Pulse 96  Temp(Src) 98.3 F (36.8 C)  Wt 163 lb (73.936 kg) Gen: NAD, resting comfortably, moves well to table HEENT: Mucous membranes are moist. Oropharynx normal. Good dentition Neck: no thyromegaly CV: RRR no murmurs rubs or gallops Lungs: CTAB no crackles, wheeze, rhonchi Abdomen: soft/nontender/nondistended/normal bowel sounds. Ext: no edema Skin: warm, dry, no rash Neuro: 2+ reflexes, 5/5 strength upper and lower extremities  Assessment/Plan:  58 y.o. female presenting for annual physical.  Health Maintenance counseling: 1. Anticipatory  guidance: Patient counseled regarding regular dental exams, wearing seatbelts.  2. Risk factor reduction:  Advised patient of need for regular exercise and diet rich and fruits and vegetables to reduce risk of heart attack and stroke.  3. Immunizations/screenings/ancillary studies Health Maintenance Due  Topic Date Due  . Zostavax -refuses this year 12/21/2013  . Influenza Vaccine -wants to get in Oct/Nov 05/25/2014   Essential hypertension Poorly controlled on lisinopril 10mg  plus propranolol 10mg  BID. Check creatinine, if ok, increase lisinopril (likely 40mg  pills, split  in half until visit)  Chronic Back Pain with spinal cord implant and on methadone Discussed with patient I would prescribe methadone given clinical situation. See AVS for full details. Will do BID dosing max instead.   Vitamin B12 deficiency Check levels today. If adequately restored, consider trial off of medicatoin with q6 month rechecks.   Solitary pulmonary nodule Recheck in December with CT noncontrast. Patient to send me message as reminder at that time.    Fasting Orders Placed This Encounter  Procedures  . LDL cholesterol, direct    [place]  . CBC    [place]  . Comprehensive metabolic panel    [place]  . TSH      . Vitamin B12

## 2014-07-02 ENCOUNTER — Ambulatory Visit: Payer: Medicare Other

## 2014-07-02 DIAGNOSIS — E039 Hypothyroidism, unspecified: Secondary | ICD-10-CM

## 2014-07-02 LAB — T4, FREE: FREE T4: 0.69 ng/dL (ref 0.60–1.60)

## 2014-07-12 ENCOUNTER — Encounter: Payer: Self-pay | Admitting: Family Medicine

## 2014-07-12 ENCOUNTER — Ambulatory Visit (INDEPENDENT_AMBULATORY_CARE_PROVIDER_SITE_OTHER): Payer: Medicare Other | Admitting: Family Medicine

## 2014-07-12 VITALS — BP 152/90 | HR 64 | Temp 98.2°F | Wt 163.0 lb

## 2014-07-12 DIAGNOSIS — I10 Essential (primary) hypertension: Secondary | ICD-10-CM

## 2014-07-12 DIAGNOSIS — E538 Deficiency of other specified B group vitamins: Secondary | ICD-10-CM

## 2014-07-12 DIAGNOSIS — Z23 Encounter for immunization: Secondary | ICD-10-CM

## 2014-07-12 MED ORDER — LISINOPRIL 40 MG PO TABS: 40.0000 mg | ORAL_TABLET | Freq: Every day | ORAL | Status: DC

## 2014-07-12 MED ORDER — CYANOCOBALAMIN 1000 MCG/ML IJ SOLN
1000.0000 ug | Freq: Once | INTRAMUSCULAR | Status: AC
  Administered 2014-07-12: 1000 ug via INTRAMUSCULAR

## 2014-07-12 MED ORDER — LISINOPRIL 10 MG PO TABS: 10.0000 mg | ORAL_TABLET | Freq: Every day | ORAL | Status: DC

## 2014-07-12 NOTE — Patient Instructions (Addendum)
Blood Pressure  Take 1/2 pill for first week (20mg  total-so you could take 2 of your 10mg  pills).   Then if tolerable after a week, increase to full dose.   Vitamin B12  Shot today  Follow up 1 month for repeat injection and BP check  Flu shot today  See me 2-4 weeks before running out of methadone.   Health Maintenance Due  Topic Date Due  . Zostavax  12/21/2013  . Influenza Vaccine  05/25/2014

## 2014-07-12 NOTE — Assessment & Plan Note (Signed)
Poor control. Continue propranolol. Titrate lisinopril to 40 mg as per after visit summary. Follow up one month

## 2014-07-12 NOTE — Assessment & Plan Note (Signed)
Poorly controlled. Start in office injections monthly. Repeat B12 in 3-6 months

## 2014-07-12 NOTE — Progress Notes (Signed)
Garret Reddish, MD Phone: [phone]  Subjective:   Alison Brooks is a 58 y.o. year old very pleasant female patient who presents with the following:  Hypertension-poor control  BP Readings from Last 3 Encounters:  07/12/14 152/90  06/28/14 152/82  05/17/14 152/88  Home BP monitoring-yes tends to run 160/90.  Compliant with medications-yes without side effects ROS-Denies any CP, HA, SOB, blurry vision, LE edema.   Vitamin b12 deficiency-poor control Patient admits to fatigue. Aware of low B12 noted last time. For insurance purposes she is converting back to injections in the office.  ROS-no change in paresthesias  Past Medical History- Patient Active Problem List   Diagnosis Date Noted  . 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 Gout 06/28/2014    Priority: Medium  . Vitamin B12 deficiency 06/28/2014    Priority: Medium  . Solitary pulmonary nodule 06/28/2014    Priority: Medium  . Essential hypertension 05/17/2014    Priority: Medium  . RESTLESS LEG SYNDROME, SEVERE 03/14/2009    Priority: Medium  . Chronic interstitial cystitis 01/20/2009    Priority: Medium  . DEPRESSION 03/19/2008    Priority: Medium  . HYPERLIPIDEMIA 02/17/2008    Priority: Medium  . Former smoker 12/21/2007    Priority: Medium  . FIBROMYALGIA 04/21/2007    Priority: Medium  . Obesity (BMI 30-39.9) 01/14/2014    Priority: Low  . Lateral epicondylitis (tennis elbow) 01/02/2014    Priority: Low  . GERD (gastroesophageal reflux disease) 03/26/2013    Priority: Low  . LEG CRAMPS 05/16/2009    Priority: Low  . TUBULOVILLOUS ADENOMA, COLON 02/17/2008    Priority: Low  . NEPHROLITHIASIS 02/17/2008    Priority: Low  . MIGRAINE, CLASSICAL W/O INTRACTABLE MIGRAINE 06/09/2007    Priority: Low  . SYMPTOM, MEMORY LOSS 06/09/2007    Priority: Low   Medications- reviewed and updated Current Outpatient Prescriptions  Medication Sig Dispense  Refill  . atorvastatin (LIPITOR) 10 MG tablet Take 1 tablet (10 mg total) by mouth daily.  90 tablet  3  . colchicine 0.6 MG tablet Take 1 tablet (0.6 mg total) by mouth 2 (two) times daily.  60 tablet  3  . cyanocobalamin (,VITAMIN B-12,) 1000 MCG/ML injection Inject 1 mL (1,000 mcg total) into the muscle every 30 (thirty) days.  10 mL  1  . diclofenac (VOLTAREN) 75 MG EC tablet Take 1 tablet (75 mg total) by mouth 2 (two) times daily.  60 tablet  2  . esomeprazole (NEXIUM) 40 MG capsule Take 1 capsule (40 mg total) by mouth daily.  30 capsule  6  . FLUoxetine (PROZAC) 40 MG capsule Take 1 capsule (40 mg total) by mouth daily.  30 capsule  6  . lisinopril (PRINIVIL,ZESTRIL) 10 MG tablet Take 1 tablet (10 mg total) by mouth daily.  90 tablet  3  . lisinopril (PRINIVIL,ZESTRIL) 40 MG tablet Take 1 tablet (40 mg total) by mouth daily.  90 tablet  3  . methadone (DOLOPHINE) 5 MG tablet Take 1 tablet (5 mg total) by mouth 3 (three) times daily as needed.  90 tablet  0  . propranolol (INDERAL) 10 MG tablet Take 1 tablet (10 mg total) by mouth 2 (two) times daily.  30 tablet  6  . topiramate (TOPAMAX) 200 MG tablet Take 1 tablet (200 mg total) by mouth daily.  30 tablet  6   No current facility-administered medications for this visit.  Objective: BP 152/90  Pulse 64  Temp(Src) 98.2 F (36.8 C)  Wt 163 lb (73.936 kg) Gen: NAD, resting comfortably in chair CV: RRR no murmurs rubs or gallops Lungs: CTAB no crackles, wheeze, rhonchi Ext: no edema Skin: warm, dry, no rash Neuro: grossly normal, moves all extremities  Assessment/Plan:  Essential hypertension Poor control. Continue propranolol. Titrate lisinopril to 40 mg as per after visit summary. Follow up one month  Vitamin B12 deficiency Poorly controlled. Start in office injections monthly. Repeat B12 in 3-6 months   Patient also mentions methadone may not be on formulary so we will address in future.  Meds ordered this encounter    Medications  . lisinopril (PRINIVIL,ZESTRIL) 40 MG tablet    Sig: Take 1 tablet (40 mg total) by mouth daily.    Dispense:  90 tablet    Refill:  3  . cyanocobalamin ((VITAMIN B-12)) injection 1,000 mcg    Sig:   . lisinopril (PRINIVIL,ZESTRIL) 10 MG tablet    Sig: Take 1 tablet (10 mg total) by mouth daily.    Dispense:  90 tablet    Refill:  3

## 2014-07-15 ENCOUNTER — Telehealth: Payer: Self-pay | Admitting: Family Medicine

## 2014-07-15 ENCOUNTER — Other Ambulatory Visit: Payer: Self-pay

## 2014-07-15 MED ORDER — BACLOFEN 5 MG HALF TABLET: 5.0000 mg | ORAL_TABLET | Freq: Three times a day (TID) | ORAL | Status: DC | PRN

## 2014-07-15 NOTE — Telephone Encounter (Signed)
Medication resent

## 2014-07-15 NOTE — Telephone Encounter (Signed)
Pt states the rx baclofen (LIORESAL) 5 mg TABS tablet is not at the pharm can you resend?

## 2014-07-15 NOTE — Telephone Encounter (Signed)
Pt having right sided lumbar pain. Would like some baclofen called into [facility]. Please let her know that Rx has been called in.

## 2014-07-15 NOTE — Telephone Encounter (Signed)
Dr. Hunter please see below 

## 2014-07-15 NOTE — Telephone Encounter (Signed)
i dont typically use baclofen for low back pain. Will try trial as appears it has been used before, if still having issues, please return to care at end of rx.

## 2014-07-29 ENCOUNTER — Other Ambulatory Visit: Payer: Medicare Other

## 2014-08-05 ENCOUNTER — Encounter: Payer: Medicare Other | Admitting: Internal Medicine

## 2014-08-09 ENCOUNTER — Encounter: Payer: Self-pay | Admitting: Family Medicine

## 2014-08-09 ENCOUNTER — Ambulatory Visit (INDEPENDENT_AMBULATORY_CARE_PROVIDER_SITE_OTHER): Payer: Medicare Other | Admitting: Family Medicine

## 2014-08-09 VITALS — BP 150/68 | HR 64 | Temp 98.4°F | Wt 167.0 lb

## 2014-08-09 DIAGNOSIS — R300 Dysuria: Secondary | ICD-10-CM

## 2014-08-09 DIAGNOSIS — E538 Deficiency of other specified B group vitamins: Secondary | ICD-10-CM

## 2014-08-09 DIAGNOSIS — N3001 Acute cystitis with hematuria: Secondary | ICD-10-CM

## 2014-08-09 DIAGNOSIS — I10 Essential (primary) hypertension: Secondary | ICD-10-CM

## 2014-08-09 LAB — POCT URINALYSIS DIPSTICK
Bilirubin, UA: NEGATIVE
Glucose, UA: NEGATIVE
Ketones, UA: NEGATIVE
LEUKOCYTES UA: NEGATIVE
Nitrite, UA: NEGATIVE
PH UA: 5
Protein, UA: NEGATIVE
Spec Grav, UA: <=1.005
UROBILINOGEN UA: 0.2

## 2014-08-09 MED ORDER — LISINOPRIL 40 MG PO TABS: 40.0000 mg | ORAL_TABLET | Freq: Every day | ORAL | Status: DC

## 2014-08-09 MED ORDER — CEPHALEXIN 500 MG PO CAPS: 500.0000 mg | ORAL_CAPSULE | Freq: Three times a day (TID) | ORAL | Status: DC

## 2014-08-09 MED ORDER — CYANOCOBALAMIN 1000 MCG/ML IJ SOLN
1000.0000 ug | Freq: Once | INTRAMUSCULAR | Status: AC
  Administered 2014-08-09: 1000 ug via INTRAMUSCULAR

## 2014-08-09 NOTE — Patient Instructions (Signed)
Blood pressure continues to improve! Let's have you take full 40mg  (can start with 1/2 pill in AM and PM if you would like)  I will see you back in December  Sorry you are having UTI symptoms. We will call with results or you are welcome to wait until they are available (20-30 minutes likely with me seeing other patients)

## 2014-08-09 NOTE — Assessment & Plan Note (Signed)
Diastolic with improved control on lisinopril 20 mg. Patient was  to be taking 40 mg. Systolic is still poorly controlled. Will have patient increase lisinopril to 40 mg and followup in December appointment. Technically at age 58 systolic goal could be 067 but believe it is reasonable to try to get to 140

## 2014-08-09 NOTE — Progress Notes (Signed)
Garret Reddish, MD Phone: [phone]  Subjective:   Alison Brooks is a 58 y.o. year old very pleasant female patient who presents with the following:  UTI symptoms-new Slight burning, excess urination x 2-3 days. Speck of blood over last 1-2 days.  Lower abdominal pain. Does get occasional UTI.  ROS-no nodular vomiting. No CVA tenderness. No fever  Hypertension-improved diastolic control, systolic still poorly controlled BP Readings from Last 3 Encounters:  08/09/14 150/68  07/12/14 152/90  06/28/14 152/82  Compliant with medications-yes without side effects but is only taking 20 mglisinopril ROS-Denies any CP, HA, SOB, blurry vision, LE edema.   Past Medical History- Patient Active Problem List   Diagnosis Date Noted  . 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 Gout 06/28/2014    Priority: Medium  . Vitamin B12 deficiency 06/28/2014    Priority: Medium  . Solitary pulmonary nodule 06/28/2014    Priority: Medium  . Essential hypertension 05/17/2014    Priority: Medium  . RESTLESS LEG SYNDROME, SEVERE 03/14/2009    Priority: Medium  . Chronic interstitial cystitis 01/20/2009    Priority: Medium  . DEPRESSION 03/19/2008    Priority: Medium  . HYPERLIPIDEMIA 02/17/2008    Priority: Medium  . Former smoker 12/21/2007    Priority: Medium  . FIBROMYALGIA 04/21/2007    Priority: Medium  . Obesity (BMI 30-39.9) 01/14/2014    Priority: Low  . Lateral epicondylitis (tennis elbow) 01/02/2014    Priority: Low  . GERD (gastroesophageal reflux disease) 03/26/2013    Priority: Low  . LEG CRAMPS 05/16/2009    Priority: Low  . TUBULOVILLOUS ADENOMA, COLON 02/17/2008    Priority: Low  . NEPHROLITHIASIS 02/17/2008    Priority: Low  . MIGRAINE, CLASSICAL W/O INTRACTABLE MIGRAINE 06/09/2007    Priority: Low  . SYMPTOM, MEMORY LOSS 06/09/2007    Priority: Low   Medications- reviewed and updated Current Outpatient  Prescriptions  Medication Sig Dispense Refill  . atorvastatin (LIPITOR) 10 MG tablet Take 1 tablet (10 mg total) by mouth daily.  90 tablet  3  . baclofen (LIORESAL) 5 mg TABS tablet Take 0.5 tablets (5 mg total) by mouth 3 (three) times daily as needed.  30 each  0  . colchicine 0.6 MG tablet Take 1 tablet (0.6 mg total) by mouth 2 (two) times daily.  60 tablet  3  . cyanocobalamin (,VITAMIN B-12,) 1000 MCG/ML injection Inject 1 mL (1,000 mcg total) into the muscle every 30 (thirty) days.  10 mL  1  . diclofenac (VOLTAREN) 75 MG EC tablet Take 1 tablet (75 mg total) by mouth 2 (two) times daily.  60 tablet  2  . esomeprazole (NEXIUM) 40 MG capsule Take 1 capsule (40 mg total) by mouth daily.  30 capsule  6  . FLUoxetine (PROZAC) 40 MG capsule Take 1 capsule (40 mg total) by mouth daily.  30 capsule  6  . lisinopril (PRINIVIL,ZESTRIL) 10 MG tablet Take 1 tablet (10 mg total) by mouth daily.  90 tablet  3  . methadone (DOLOPHINE) 5 MG tablet Take 1 tablet (5 mg total) by mouth 3 (three) times daily as needed.  90 tablet  0  . propranolol (INDERAL) 10 MG tablet Take 1 tablet (10 mg total) by mouth 2 (two) times daily.  30 tablet  6  . topiramate (TOPAMAX) 200 MG tablet Take 1 tablet (200 mg total) by mouth daily.  30 tablet  6  .  lisinopril (PRINIVIL,ZESTRIL) 40 MG tablet Take 1 tablet (40 mg total) by mouth daily.  90 tablet  3   No current facility-administered medications for this visit.    Objective: BP 150/68  Pulse 64  Temp(Src) 98.4 F (36.9 C)  Wt 167 lb (75.751 kg) Gen: NAD, resting comfortably CV: RRR no murmurs rubs or gallops Lungs: CTAB no crackles, wheeze, rhonchi Abdomen: soft/nontender/nondistended/normal bowel sounds.specifically no suprapubic tenderness MSK: No cva tenderness Ext: no edema Skin: warm, dry, no rash Neuro: grossly normal, moves all extremities   Results for orders placed in visit on 08/09/14 (from the past 24 hour(s))  POCT URINALYSIS DIPSTICK      Status: None   Collection Time    08/09/14 10:36 AM      Result Value Ref Range   Color, UA yellow     Clarity, UA cloudy     Glucose, UA neg     Bilirubin, UA neg     Ketones, UA neg     Spec Grav, UA <=1.005     Blood, UA 1+     pH, UA 5.0     Protein, UA neg     Urobilinogen, UA 0.2     Nitrite, UA neg     Leukocytes, UA Negative      Assessment/Plan:  Essential hypertension Diastolic with improved control on lisinopril 20 mg. Patient was  to be taking 40 mg. Systolic is still poorly controlled. Will have patient increase lisinopril to 40 mg and followup in December appointment. Technically at age 55 systolic goal could be 817 but believe it is reasonable to try to get to 140   UTI Dysuria as well as blood in urine and could simply be UTI. Treat with Keflex x7 days. Alternatively this could be a flare of her chronic interstitial cystitis. If no resolution may need to get her to followup with urology. If culture was negative could also consider sending to urology  Return precautions advised.   Orders Placed This Encounter  Procedures  . Culture, Urine  . POC Urinalysis Dipstick    Meds ordered this encounter  Medications  . lisinopril (PRINIVIL,ZESTRIL) 40 MG tablet    Sig: Take 1 tablet (40 mg total) by mouth daily.    Dispense:  90 tablet    Refill:  3  . cyanocobalamin ((VITAMIN B-12)) injection 1,000 mcg    Sig:   . cephALEXin (KEFLEX) 500 MG capsule    Sig: Take 1 capsule (500 mg total) by mouth 3 (three) times daily.    Dispense:  21 capsule    Refill:  0

## 2014-08-10 ENCOUNTER — Telehealth: Payer: Self-pay | Admitting: Family Medicine

## 2014-08-12 LAB — URINE CULTURE: Colony Count: 80000

## 2014-09-11 ENCOUNTER — Telehealth: Payer: Self-pay | Admitting: Family Medicine

## 2014-09-11 DIAGNOSIS — N3001 Acute cystitis with hematuria: Secondary | ICD-10-CM

## 2014-09-11 MED ORDER — OXYBUTYNIN CHLORIDE ER 5 MG PO TB24: 5.0000 mg | ORAL_TABLET | Freq: Two times a day (BID) | ORAL | Status: DC

## 2014-09-11 NOTE — Telephone Encounter (Signed)
Culture ordered and Oxybutynin called in

## 2014-09-11 NOTE — Telephone Encounter (Signed)
It looks like Pt hasnt been on Oxybutynin since 2012 per her medication Hx, is this ok to send in along with her Cephalaxin?

## 2014-09-11 NOTE — Telephone Encounter (Signed)
Yes can do the oxybutynin. Order her a urine culture and we would treat based off culture data.

## 2014-09-11 NOTE — Telephone Encounter (Signed)
Pt had an appt [facility] today and they call today and resch her until 09/26/14. Pt would like a refill on oxybutynin 5 mg twice a day and needs another refill of abx cephalexin 500 mg three times a day for 7 days.[facility] .

## 2014-09-17 ENCOUNTER — Telehealth: Payer: Self-pay | Admitting: Family Medicine

## 2014-09-17 NOTE — Telephone Encounter (Signed)
I received a PA request for Oxybutynin, there isn't a diagnosis listed for OA bladder.  Is that the correct diagnosis for the patient??

## 2014-09-17 NOTE — Telephone Encounter (Signed)
Please advise 

## 2014-09-24 NOTE — Telephone Encounter (Signed)
Tier exception request was faxed to [facility] on 09/24/14.

## 2014-09-24 NOTE — Telephone Encounter (Signed)
She uses it for chronic interstitial cystitis from my review of her chart. She has symptoms of overactive bladder but I think it is due to the interstitial cystitis. Will this work?

## 2014-09-24 NOTE — Telephone Encounter (Signed)
If is submit it for overactive bladder, the PA might go through. Unless they have other medications for that diagnosis that is preferred.  They might not approve it for chronic interstitial cystitis. If it's ok, I can submit it for the OA??

## 2014-09-24 NOTE — Telephone Encounter (Signed)
Yes lets do OAB then. She does have OAB symptoms so it is appropriate.

## 2014-09-24 NOTE — Telephone Encounter (Signed)
Dr. Hunter see below 

## 2014-09-24 NOTE — Telephone Encounter (Signed)
Tier Exception was approved.  Tier 2 co-pay till 10/25/15

## 2014-09-24 NOTE — Telephone Encounter (Signed)
Dr. Hunter please advise. 

## 2014-10-01 ENCOUNTER — Telehealth: Payer: Self-pay | Admitting: Family Medicine

## 2014-10-01 DIAGNOSIS — R911 Solitary pulmonary nodule: Secondary | ICD-10-CM

## 2014-10-01 NOTE — Telephone Encounter (Signed)
Patient needs follow up CT chest for nodule previously found. I have ordered. Please inform patient she will be getting a call soon about scheduling. If she does not hear back within 2 weeks, please let us know.

## 2014-10-01 NOTE — Telephone Encounter (Signed)
Pt notified and will inform us if she hasnt heard anything within 2 weeks

## 2014-10-01 NOTE — Telephone Encounter (Signed)
-----   Message from [name], MD sent at 06/28/2014  1:19 PM EDT ----- In December for repeat CT chest for pulmonary nodule

## 2014-10-07 ENCOUNTER — Ambulatory Visit (INDEPENDENT_AMBULATORY_CARE_PROVIDER_SITE_OTHER)
Admission: RE | Admit: 2014-10-07 | Discharge: 2014-10-07 | Disposition: A | Discharge status: Home / Self Care | Payer: Medicare Other | Source: Ambulatory Visit | Attending: Family Medicine | Admitting: Family Medicine

## 2014-10-07 DIAGNOSIS — R911 Solitary pulmonary nodule: Secondary | ICD-10-CM

## 2014-10-09 ENCOUNTER — Encounter: Payer: Self-pay | Admitting: Family Medicine

## 2014-10-09 ENCOUNTER — Encounter: Payer: Self-pay | Admitting: *Deleted

## 2014-10-09 ENCOUNTER — Ambulatory Visit (INDEPENDENT_AMBULATORY_CARE_PROVIDER_SITE_OTHER): Payer: Medicare Other | Admitting: Family Medicine

## 2014-10-09 VITALS — BP 110/70 | Temp 98.4°F | Wt 186.0 lb

## 2014-10-09 DIAGNOSIS — M5441 Lumbago with sciatica, right side: Secondary | ICD-10-CM

## 2014-10-09 DIAGNOSIS — M10072 Idiopathic gout, left ankle and foot: Secondary | ICD-10-CM

## 2014-10-09 MED ORDER — ALLOPURINOL 100 MG PO TABS: 100.0000 mg | ORAL_TABLET | Freq: Every day | ORAL | Status: DC

## 2014-10-09 NOTE — Assessment & Plan Note (Signed)
Pain contract 10/09/14. Methadone 5mg  BID max down from 5mg  TID under Dr. Arnoldo Morale. Taking daily (sometimes every other day). Baclofen also as needed for current flare and advised patient she can take daily. No red flags. Patient aware we will plan on at least once a year UDS testing.

## 2014-10-09 NOTE — Progress Notes (Signed)
Garret Reddish, MD Phone: [phone]  Subjective:   Alison Brooks is a 58 y.o. year old very pleasant female patient who presents with the following:  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-stable with current mild flare Methadone once a day or every other day. She states she has extra medication at home that will last her a minimum of 6 months given once a day dosing instead of TID.  Recent flare picked up baclofen and seems to be helping. Sticking to methadone 1 every other day. ROS-No saddle anesthesia, bladder incontinence, fecal incontinence, weakness in extremity, numbness or tingling in extremity.   Gout-controlled Well controlled on bid colchicine.Insurance is goingto put colchicinein higher pay class ROS- no host swollen joints. No fever/chills.  Past Medical History- Patient Active Problem List   Diagnosis Date Noted  . 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 Gout 06/28/2014    Priority: Medium  . Vitamin B12 deficiency 06/28/2014    Priority: Medium  . Solitary pulmonary nodule 06/28/2014    Priority: Medium  . Essential hypertension 05/17/2014    Priority: Medium  . RESTLESS LEG SYNDROME, SEVERE 03/14/2009    Priority: Medium  . Chronic interstitial cystitis 01/20/2009    Priority: Medium  . DEPRESSION 03/19/2008    Priority: Medium  . HYPERLIPIDEMIA 02/17/2008    Priority: Medium  . Former smoker 12/21/2007    Priority: Medium  . FIBROMYALGIA 04/21/2007    Priority: Medium  . Obesity (BMI 30-39.9) 01/14/2014    Priority: Low  . Lateral epicondylitis (tennis elbow) 01/02/2014    Priority: Low  . GERD (gastroesophageal reflux disease) 03/26/2013    Priority: Low  . LEG CRAMPS 05/16/2009    Priority: Low  . TUBULOVILLOUS ADENOMA, COLON 02/17/2008    Priority: Low  . NEPHROLITHIASIS 02/17/2008    Priority: Low  . MIGRAINE, CLASSICAL W/O INTRACTABLE MIGRAINE 06/09/2007      Priority: Low  . SYMPTOM, MEMORY LOSS 06/09/2007    Priority: Low   Medications- reviewed and updated Current Outpatient Prescriptions  Medication Sig Dispense Refill  . atorvastatin (LIPITOR) 10 MG tablet Take 1 tablet (10 mg total) by mouth daily. 90 tablet 3  . baclofen (LIORESAL) 5 mg TABS tablet Take 0.5 tablets (5 mg total) by mouth 3 (three) times daily as needed. 30 each 0  . colchicine 0.6 MG tablet Take 1 tablet (0.6 mg total) by mouth 2 (two) times daily. 60 tablet 3  . cyanocobalamin (,VITAMIN B-12,) 1000 MCG/ML injection Inject 1 mL (1,000 mcg total) into the muscle every 30 (thirty) days. 10 mL 1  . diclofenac (VOLTAREN) 75 MG EC tablet Take 1 tablet (75 mg total) by mouth 2 (two) times daily. 60 tablet 2  . esomeprazole (NEXIUM) 40 MG capsule Take 1 capsule (40 mg total) by mouth daily. 30 capsule 6  . FLUoxetine (PROZAC) 40 MG capsule Take 1 capsule (40 mg total) by mouth daily. 30 capsule 6  . lisinopril (PRINIVIL,ZESTRIL) 40 MG tablet Take 1 tablet (40 mg total) by mouth daily. 90 tablet 3  . oxybutynin (DITROPAN-XL) 5 MG 24 hr tablet Take 1 tablet (5 mg total) by mouth 2 (two) times daily. 30 tablet 0  . propranolol (INDERAL) 10 MG tablet Take 1 tablet (10 mg total) by mouth 2 (two) times daily. 30 tablet 6  . topiramate (TOPAMAX) 200 MG tablet Take 1 tablet (200 mg total) by mouth daily.  30 tablet 6  . allopurinol (ZYLOPRIM) 100 MG tablet Take 1 tablet (100 mg total) by mouth daily. 30 tablet 6  . methadone (DOLOPHINE) 5 MG tablet Take 1 tablet (5 mg total) by mouth 3 (three) times daily as needed. (Patient not taking: Reported on 10/09/2014) 90 tablet 0   No current facility-administered medications for this visit.    Objective: BP 110/70 mmHg  Temp(Src) 98.4 F (36.9 C)  Wt 186 lb (84.369 kg) Gen: NAD, resting comfortably CV: RRR no murmurs rubs or gallops Lungs: CTAB no crackles, wheeze, rhonchi Abdomen: soft/nontender/nondistended/normal bowel sounds.  MSK  pain in right low back with palpation over area of muscle spasm Ext: no edema Skin: warm, dry, no rash Neuro: grossly normal, moves all extremities. 5/5 lower extremity strength. No saddle anesthesia  Assessment/Plan:  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 Pain contract 10/09/14. Methadone 5mg  BID max down from 5mg  TID under Dr. Arnoldo Morale. Taking daily (sometimes every other day). Baclofen also as needed for current flare and advised patient she can take daily. No red flags. Patient aware we will plan on at least once a year UDS testing.    Gout Start allopurinol 100mg . Continue colchicine as per AVS. FOllow up in 3 months to recheck uric acid then can use colchicine as needed for flares in future.  3 month f/u  Meds ordered this encounter  Medications  . allopurinol (ZYLOPRIM) 100 MG tablet    Sig: Take 1 tablet (100 mg total) by mouth daily.    Dispense:  30 tablet    Refill:  6

## 2014-10-09 NOTE — Assessment & Plan Note (Signed)
Start allopurinol 100mg . Continue colchicine as per AVS. FOllow up in 3 months to recheck uric acid then can use colchicine as needed for flares in future.

## 2014-10-09 NOTE — Patient Instructions (Addendum)
Sorry you are having a flare on back pain. During this flare, ok to takethe methadone once a day. Continue baclofen.   Regarding gout, take allopurinol 100mg  to lower uric acid levels. Continue twice a day colchicine for 3 weeks, then for 5 more weeks use it once a day, then stop  In 3 months we will check your uric acid level to see if we need further adjustments  Blood pressure looks great. No changes

## 2014-10-11 ENCOUNTER — Encounter: Payer: Self-pay | Admitting: Family Medicine

## 2014-10-30 DIAGNOSIS — M25512 Pain in left shoulder: Secondary | ICD-10-CM | POA: Diagnosis not present

## 2014-11-01 ENCOUNTER — Encounter: Payer: Self-pay | Admitting: Internal Medicine

## 2014-11-01 ENCOUNTER — Ambulatory Visit (INDEPENDENT_AMBULATORY_CARE_PROVIDER_SITE_OTHER): Payer: 59 | Admitting: Internal Medicine

## 2014-11-01 VITALS — BP 130/80 | HR 84 | Temp 98.5°F | Ht 63.0 in | Wt 172.2 lb

## 2014-11-01 DIAGNOSIS — R918 Other nonspecific abnormal finding of lung field: Secondary | ICD-10-CM | POA: Diagnosis not present

## 2014-11-01 DIAGNOSIS — I10 Essential (primary) hypertension: Secondary | ICD-10-CM | POA: Diagnosis not present

## 2014-11-01 DIAGNOSIS — R0600 Dyspnea, unspecified: Secondary | ICD-10-CM

## 2014-11-01 DIAGNOSIS — R058 Other specified cough: Secondary | ICD-10-CM

## 2014-11-01 DIAGNOSIS — R05 Cough: Secondary | ICD-10-CM

## 2014-11-01 NOTE — Progress Notes (Addendum)
Subjective:     Patient ID: Alison Brooks, female   DOB: 1956-06-23,     MRN: [id number]  HPI  63 yowf  Quit smoking 2009 with no apparently sequealae good aerobic tolerance at 110-115 lb then by 2014 was experiencing doe x flight of steps but also episodes of sob sitting still with palpitations s warning x sev hours sev times a week assoc with elevated bp > Jenkins eval > CT > mpn > repeat 09/2014 > more MPNs self referred to pulmonary clinic 11/01/2014    11/01/2014 1st Alison Brooks visit/ [facility]   Chief Complaint  Patient presents with  . Advice Only    self referral - SOB, chest pain, Lung nodules (4) showed up on CT scan done 10/07/14  no h/o ca, rheumatologic dz/ histo exposure.  For at least a year and about once a week still has same spells of resting sob s provocation assoc with palp/ no diaph or nausea or presycope   and non-variable /proportionate doe x one flight of steps, no longer exercising but ex never brought on the same spells she has at rest. Never has noct  / denies excess caffeine or decongestant use.  Dry hacking cough x 2 years sometimes during the day with urge to clear throat, no sign sputum.  No obvious day to day or daytime variabilty or assoc   cp or chest tightness, subjective wheeze overt sinus or hb symptoms. No unusual exp hx or h/o childhood pna/ asthma or knowledge of premature birth.  Sleeping ok without nocturnal  or early am exacerbation  of respiratory  c/o's or need for noct saba. Also denies any obvious fluctuation of symptoms with weather or environmental changes or other aggravating or alleviating factors except as outlined above   Current Medications, Allergies, Complete Past Medical History, Past Surgical History, Family History, and Social History were reviewed in [employer] record.  ROS  The following are not active complaints unless bolded sore throat, dysphagia, dental problems, itching, sneezing,  nasal  congestion or excess/ purulent secretions, ear ache,   fever, chills, sweats, unintended wt loss, pleuritic or exertional cp, hemoptysis,  orthopnea pnd or leg swelling, presyncope, palpitations, heartburn, abdominal pain, anorexia, nausea, vomiting, diarrhea  or change in bowel or urinary habits, change in stools or urine, dysuria,hematuria,  rash, arthralgias, visual complaints, headache, numbness weakness or ataxia or problems with walking or coordination,  change in mood/affect or memory.         Review of Systems     Objective:   Physical Exam     Wt Readings from Last 3 Encounters:  11/01/14 172 lb 3.2 oz (78.109 kg)  10/09/14 186 lb (84.369 kg)  08/09/14 167 lb (75.751 kg)    Vital signs reviewed   HEENT: nl dentition, turbinates, and orophanx. Nl external ear canals without cough reflex   NECK :  without JVD/Nodes/TM/ nl carotid upstrokes bilaterally   LUNGS: no acc muscle use, clear to A and P bilaterally without cough on insp or exp maneuvers   CV:  RRR  no s3 or murmur or increase in P2, no edema   ABD:  soft and nontender with nl excursion in the supine position. No bruits or organomegaly, bowel sounds nl  MS:  warm without deformities, calf tenderness, cyanosis or clubbing  SKIN: warm and dry without lesions    NEURO:  alert, approp, no deficits       Assessment:

## 2014-11-01 NOTE — Patient Instructions (Addendum)
Your cough is due to lisinopril and the only way to prove it is stop for a minimum of 6 weeks then return if not satisfied  For drainage take chlortrimeton (chlorpheniramine) 4 mg every 4 hours available over the counter (may cause drowsiness)   We will call you in 6 months to make sure you get a CT scan no contrast (but it looks like that's already the plan)

## 2014-11-02 DIAGNOSIS — R05 Cough: Secondary | ICD-10-CM | POA: Insufficient documentation

## 2014-11-02 DIAGNOSIS — R058 Other specified cough: Secondary | ICD-10-CM | POA: Insufficient documentation

## 2014-11-02 DIAGNOSIS — R0600 Dyspnea, unspecified: Secondary | ICD-10-CM | POA: Insufficient documentation

## 2014-11-02 NOTE — Assessment & Plan Note (Signed)
Would strongly rec trial off acei due to cough  and push beta blockers harder as also has intermittent palpitations with resting pulse 84 on inderal so there's lots of room to go up.  Defer rx to primary care, pt advised the only way to tell if the cough is due to the acei is 6 weeks off it.

## 2014-11-02 NOTE — Assessment & Plan Note (Addendum)
Lab Results  Component Value Date   TSH 5.42* 06/28/2014    cta 10/10/13 1. No evidence of pulmonary embolism. 2. No acute findings in the thorax to account for the patient's Symptoms.   She has two patterns:  1) chronic/ reproducible with ex like steps/ proportionate to wt gain > rec wt loss (note tsh on high side and may need to be repeated)  2) paroxyms at rest assoc with palpitations c/w benign atrial dysrhytmias or mvp > defer W/u to Dr Yong Channel (see comments under hbp re use of BB here instead of ACEi)  F/u with PFT's optional at this point

## 2014-11-02 NOTE — Assessment & Plan Note (Signed)
Extended discussion with pt re MPN's > CT results reviewed with pt >>> Too small for PET or bx, not suspicious enough for excisional bx > really only option for now is follow the Fleischner society guidelines as rec by radiology> 6 months repeat approp as already outlined by [facility].

## 2014-11-02 NOTE — Assessment & Plan Note (Signed)
Classic Upper airway cough syndrome, so named because it's frequently impossible to sort out how much is  CR/sinusitis with freq throat clearing (which can be related to primary GERD)   vs  causing  secondary (" extra esophageal")  GERD from wide swings in gastric pressure that occur with throat clearing, often  promoting self use of mint and menthol lozenges that reduce the lower esophageal sphincter tone and exacerbate the problem further in a cyclical fashion.   These are the same pts (now being labeled as having "irritable larynx syndrome" by some cough centers) who not infrequently have a history of having failed to tolerate ace inhibitors,  dry powder inhalers or biphosphonates or report having atypical reflux symptoms that don't respond to standard doses of PPI , and are easily confused as having aecopd or asthma flares by even experienced allergists/ pulmonologists.   Alison Brooks can use 1st gen h1 per guidelines prn but best approach would be to stop one med (ACEi) rather than add another to treat a complication of the first.

## 2015-01-07 ENCOUNTER — Other Ambulatory Visit: Payer: Self-pay

## 2015-01-07 MED ORDER — FLUOXETINE HCL 40 MG PO CAPS: 40.0000 mg | ORAL_CAPSULE | Freq: Every day | ORAL | Status: DC

## 2015-01-07 NOTE — Telephone Encounter (Signed)
Rx for fluoxetine 40 mg capsule #90  Pharm:  [name]   Pls advise.

## 2015-01-08 ENCOUNTER — Encounter: Payer: Self-pay | Admitting: Family Medicine

## 2015-01-08 ENCOUNTER — Other Ambulatory Visit: Payer: Self-pay | Admitting: Family Medicine

## 2015-01-08 ENCOUNTER — Ambulatory Visit (INDEPENDENT_AMBULATORY_CARE_PROVIDER_SITE_OTHER): Payer: Medicare Other | Admitting: Family Medicine

## 2015-01-08 ENCOUNTER — Other Ambulatory Visit: Payer: Self-pay | Admitting: *Deleted

## 2015-01-08 VITALS — BP 112/90 | HR 87 | Temp 97.9°F | Wt 167.0 lb

## 2015-01-08 DIAGNOSIS — R7989 Other specified abnormal findings of blood chemistry: Secondary | ICD-10-CM

## 2015-01-08 DIAGNOSIS — M10072 Idiopathic gout, left ankle and foot: Secondary | ICD-10-CM

## 2015-01-08 DIAGNOSIS — E785 Hyperlipidemia, unspecified: Secondary | ICD-10-CM | POA: Diagnosis not present

## 2015-01-08 DIAGNOSIS — R05 Cough: Secondary | ICD-10-CM

## 2015-01-08 DIAGNOSIS — I10 Essential (primary) hypertension: Secondary | ICD-10-CM

## 2015-01-08 DIAGNOSIS — R058 Other specified cough: Secondary | ICD-10-CM

## 2015-01-08 DIAGNOSIS — E669 Obesity, unspecified: Secondary | ICD-10-CM

## 2015-01-08 LAB — URIC ACID: URIC ACID, SERUM: 4 mg/dL (ref 2.4–7.0)

## 2015-01-08 LAB — T4, FREE: Free T4: 0.77 ng/dL (ref 0.60–1.60)

## 2015-01-08 LAB — LDL CHOLESTEROL, DIRECT: Direct LDL: 171 mg/dL

## 2015-01-08 LAB — TSH: TSH: 4.11 u[IU]/mL (ref 0.35–4.50)

## 2015-01-08 MED ORDER — ATORVASTATIN CALCIUM 10 MG PO TABS: 10.0000 mg | ORAL_TABLET | Freq: Every day | ORAL | Status: DC

## 2015-01-08 MED ORDER — ALLOPURINOL 100 MG PO TABS: 100.0000 mg | ORAL_TABLET | Freq: Every day | ORAL | Status: DC

## 2015-01-08 MED ORDER — PROPRANOLOL HCL 40 MG PO TABS: 40.0000 mg | ORAL_TABLET | Freq: Two times a day (BID) | ORAL | Status: DC

## 2015-01-08 MED ORDER — FLUOXETINE HCL 40 MG PO CAPS: 40.0000 mg | ORAL_CAPSULE | Freq: Every day | ORAL | Status: DC

## 2015-01-08 MED ORDER — BACLOFEN 10 MG PO TABS: 5.0000 mg | ORAL_TABLET | Freq: Three times a day (TID) | ORAL | Status: DC

## 2015-01-08 MED ORDER — ATORVASTATIN CALCIUM 40 MG PO TABS: 40.0000 mg | ORAL_TABLET | Freq: Every day | ORAL | Status: DC

## 2015-01-08 MED ORDER — PROPRANOLOL HCL 10 MG PO TABS: 10.0000 mg | ORAL_TABLET | Freq: Two times a day (BID) | ORAL | Status: DC

## 2015-01-08 MED ORDER — OXYBUTYNIN CHLORIDE ER 5 MG PO TB24: 5.0000 mg | ORAL_TABLET | Freq: Two times a day (BID) | ORAL | Status: DC

## 2015-01-08 MED ORDER — LISINOPRIL 40 MG PO TABS: 40.0000 mg | ORAL_TABLET | Freq: Every day | ORAL | Status: DC

## 2015-01-08 MED ORDER — TOPIRAMATE 200 MG PO TABS: 200.0000 mg | ORAL_TABLET | Freq: Every day | ORAL | Status: DC

## 2015-01-08 MED ORDER — LISINOPRIL 20 MG PO TABS: 40.0000 mg | ORAL_TABLET | Freq: Every day | ORAL | Status: DC

## 2015-01-08 NOTE — Assessment & Plan Note (Signed)
We will titrate ace-i to 1/2 dose (in attempts to wean off per pulm recs) and increase propranolol to 40mg  BID as substitute.

## 2015-01-08 NOTE — Assessment & Plan Note (Signed)
titrate ace-i to 1/2 dose at 20mg  lisinopril (in attempts to wean off per pulm recs) and increase propranolol to 40mg  BID as substitute. Follow up 3 months

## 2015-01-08 NOTE — Patient Instructions (Addendum)
Cut lisinopril in 1/2 with 20mg  instead of 40mg  Increase propranolol to 40mg  twice a day F/u 3 months to see if we can get off lisinopril per pulm request  Check uric acid on allopurinol  Check thyroid again. I would advise regular exercise and weight watchers  All [facility] to [facility] ordered this encounter  Medications  . allopurinol (ZYLOPRIM) 100 MG tablet    Sig: Take 1 tablet (100 mg total) by mouth daily.    Dispense:  90 tablet    Refill:  3  . atorvastatin (LIPITOR) 10 MG tablet    Sig: Take 1 tablet (10 mg total) by mouth daily.    Dispense:  90 tablet    Refill:  3  . FLUoxetine (PROZAC) 40 MG capsule    Sig: Take 1 capsule (40 mg total) by mouth daily.    Dispense:  90 capsule    Refill:  3  . lisinopril (PRINIVIL,ZESTRIL) 20 MG tablet    Sig: Take 2 tablets (40 mg total) by mouth daily.    Dispense:  90 tablet    Refill:  3  . oxybutynin (DITROPAN-XL) 5 MG 24 hr tablet    Sig: Take 1 tablet (5 mg total) by mouth 2 (two) times daily.    Dispense:  90 tablet    Refill:  3  . propranolol (INDERAL) 40 MG tablet    Sig: Take 1 tablet (40 mg total) by mouth 2 (two) times daily.    Dispense:  180 tablet    Refill:  3  . topiramate (TOPAMAX) 200 MG tablet    Sig: Take 1 tablet (200 mg total) by mouth daily.    Dispense:  90 tablet    Refill:  3  . baclofen (LIORESAL) 10 MG tablet    Sig: Take 0.5-1 tablets (5-10 mg total) by mouth 3 (three) times daily.    Dispense:  180 each    Refill:  3   Did not send voltaren

## 2015-01-08 NOTE — Progress Notes (Signed)
Garret Reddish, MD Phone: [phone]  Subjective:   Alison Brooks is a 59 y.o. year old very pleasant female patient who presents with the following:  Upper airway cough syndrome- continues to have intermittent cough/throat clearing Pulm wants ace lower and push beta blocker Hypertension-very mild poor control but previously controlled.   BP Readings from Last 3 Encounters:  01/08/15 112/90  11/01/14 130/80  10/09/14 110/70   Home BP monitoring-no Compliant with medications-yes without side effects ROS-Denies any CP, HA, SOB above baseline (pulm thinks deconditioning), blurry vision, LE edema  Gout-stable No flares on allopurinol and off colchicine. Needs updated uric acid ROS- no hot swollen joints  Obesity- stable TSH mildly elevate. States having hard time losing weight. Has tried many diets that havent worked. Just started home zumba. Wants to try phentermine again.  ROS- no unintentional weight gain  Past Medical History- Patient Active Problem List   Diagnosis Date Noted  . 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 Gout 06/28/2014    Priority: Medium  . Vitamin B12 deficiency 06/28/2014    Priority: Medium  . Multiple pulmonary nodules 06/28/2014    Priority: Medium  . Essential hypertension 05/17/2014    Priority: Medium  . RESTLESS LEG SYNDROME, SEVERE 03/14/2009    Priority: Medium  . Chronic interstitial cystitis 01/20/2009    Priority: Medium  . DEPRESSION 03/19/2008    Priority: Medium  . Hyperlipidemia 02/17/2008    Priority: Medium  . Former smoker 12/21/2007    Priority: Medium  . FIBROMYALGIA 04/21/2007    Priority: Medium  . Obesity (BMI 30-39.9) 01/14/2014    Priority: Low  . Lateral epicondylitis (tennis elbow) 01/02/2014    Priority: Low  . GERD (gastroesophageal reflux disease) 03/26/2013    Priority: Low  . LEG CRAMPS 05/16/2009    Priority: Low  . TUBULOVILLOUS ADENOMA, COLON  02/17/2008    Priority: Low  . NEPHROLITHIASIS 02/17/2008    Priority: Low  . MIGRAINE, CLASSICAL W/O INTRACTABLE MIGRAINE 06/09/2007    Priority: Low  . SYMPTOM, MEMORY LOSS 06/09/2007    Priority: Low  . Dyspnea 11/02/2014  . Upper airway cough syndrome 11/02/2014   Medications- reviewed and updated Current Outpatient Prescriptions  Medication Sig Dispense Refill  . allopurinol (ZYLOPRIM) 100 MG tablet Take 1 tablet (100 mg total) by mouth daily. 90 tablet 1  . atorvastatin (LIPITOR) 10 MG tablet Take 1 tablet (10 mg total) by mouth daily. 90 tablet 3  . baclofen (LIORESAL) 5 mg TABS tablet Take 0.5 tablets (5 mg total) by mouth 3 (three) times daily as needed. 30 each 0  . cyanocobalamin (,VITAMIN B-12,) 1000 MCG/ML injection Inject 1 mL (1,000 mcg total) into the muscle every 30 (thirty) days. 10 mL 1  . esomeprazole (NEXIUM) 40 MG capsule Take 1 capsule (40 mg total) by mouth daily. (Patient taking differently: Take 22.6 mg by mouth 2 (two) times daily before a meal. ) 30 capsule 6  . FLUoxetine (PROZAC) 40 MG capsule Take 1 capsule (40 mg total) by mouth daily. 30 capsule 6  . lisinopril (PRINIVIL,ZESTRIL) 40 MG tablet Take 1 tablet (40 mg total) by mouth daily. 90 tablet 1  . oxybutynin (DITROPAN-XL) 5 MG 24 hr tablet Take 1 tablet (5 mg total) by mouth 2 (two) times daily. 30 tablet 0  . propranolol (INDERAL) 10 MG tablet Take 1 tablet (10 mg total) by mouth 2 (two) times daily. 90 tablet  1  . topiramate (TOPAMAX) 200 MG tablet Take 1 tablet (200 mg total) by mouth daily. 30 tablet 6  . diclofenac (VOLTAREN) 75 MG EC tablet Take 1 tablet (75 mg total) by mouth 2 (two) times daily. (Patient not taking: Reported on 01/08/2015) 60 tablet 2   No current facility-administered medications for this visit.     Objective: BP 112/90 mmHg  Pulse 87  Temp(Src) 97.9 F (36.6 C)  Wt 167 lb (75.751 kg) Gen: NAD, resting comfortably CV: RRR no murmurs rubs or gallops Lungs: CTAB no  crackles, wheeze, rhonchi Abdomen: soft/nontender/nondistended/normal bowel sounds.  Ext: no edema Skin: warm, dry, no rash   Assessment/Plan:  Upper airway cough syndrome We will titrate ace-i to 1/2 dose (in attempts to wean off per pulm recs) and increase propranolol to 40mg  BID as substitute.    Essential hypertension Very mild poor diastolic control. titrate ace-i to 1/2 dose at 20mg  lisinopril (in attempts to wean off per pulm recs) and increase propranolol to 40mg  BID as substitute. Follow up 3 months   Gout Colchicine BID scheduled under Dr. Arnoldo Morale. 2-3 x a year flare with this. Change to allopurinol last visit and no flares in 3 months. Check uric acid today.    Obesity (BMI 30-39.9) Doubt thyroid issue but will check TSH, t4 with previous slight increase in TSH.    3 month follow up. Also patient has weaned completely off of methadone at this person and I congratulated her  Orders Placed This Encounter  Procedures  . TSH    [place]  . T4, free    [place]  . Uric Acid  . LDL cholesterol, direct    [place]   To [facility] Meds ordered this encounter  Medications  . allopurinol (ZYLOPRIM) 100 MG tablet    Sig: Take 1 tablet (100 mg total) by mouth daily.    Dispense:  90 tablet    Refill:  3  . atorvastatin (LIPITOR) 10 MG tablet    Sig: Take 1 tablet (10 mg total) by mouth daily.    Dispense:  90 tablet    Refill:  3  . FLUoxetine (PROZAC) 40 MG capsule    Sig: Take 1 capsule (40 mg total) by mouth daily.    Dispense:  90 capsule    Refill:  3  . lisinopril (PRINIVIL,ZESTRIL) 20 MG tablet    Sig: Take 2 tablets (40 mg total) by mouth daily.    Dispense:  90 tablet    Refill:  3  . oxybutynin (DITROPAN-XL) 5 MG 24 hr tablet    Sig: Take 1 tablet (5 mg total) by mouth 2 (two) times daily.    Dispense:  90 tablet    Refill:  3  . propranolol (INDERAL) 40 MG tablet    Sig: Take 1 tablet (40 mg total) by mouth 2 (two) times daily.    Dispense:  180 tablet     Refill:  3  . topiramate (TOPAMAX) 200 MG tablet    Sig: Take 1 tablet (200 mg total) by mouth daily.    Dispense:  90 tablet    Refill:  3  . baclofen (LIORESAL) 10 MG tablet    Sig: Take 0.5-1 tablets (5-10 mg total) by mouth 3 (three) times daily.    Dispense:  180 each    Refill:  3

## 2015-01-08 NOTE — Assessment & Plan Note (Signed)
Colchicine BID scheduled under Dr. Arnoldo Morale. 2-3 x a year flare with this. Change to allopurinol last visit and no flares in 3 months. Check uric acid today.

## 2015-01-08 NOTE — Assessment & Plan Note (Signed)
Doubt thyroid issue but will check TSH, t4 with previous slight increase in TSH.

## 2015-01-09 ENCOUNTER — Other Ambulatory Visit: Payer: Self-pay

## 2015-01-09 MED ORDER — ATORVASTATIN CALCIUM 40 MG PO TABS: 40.0000 mg | ORAL_TABLET | Freq: Every day | ORAL | Status: DC

## 2015-01-11 ENCOUNTER — Other Ambulatory Visit: Payer: Self-pay | Admitting: Family Medicine

## 2015-01-11 MED ORDER — LISINOPRIL 20 MG PO TABS: 20.0000 mg | ORAL_TABLET | Freq: Every day | ORAL | Status: DC

## 2015-01-13 MED ORDER — TOPIRAMATE 200 MG PO TABS: 200.0000 mg | ORAL_TABLET | Freq: Every day | ORAL | Status: DC

## 2015-01-13 NOTE — Addendum Note (Signed)
Addended by: Colleen Can on: 01/13/2015 10:05 AM   Modules accepted: Orders

## 2015-01-27 ENCOUNTER — Ambulatory Visit (INDEPENDENT_AMBULATORY_CARE_PROVIDER_SITE_OTHER): Payer: Medicare Other | Admitting: Family Medicine

## 2015-01-27 ENCOUNTER — Encounter: Payer: Self-pay | Admitting: Family Medicine

## 2015-01-27 VITALS — BP 110/60 | HR 72 | Temp 98.2°F | Wt 162.0 lb

## 2015-01-27 DIAGNOSIS — R6889 Other general symptoms and signs: Secondary | ICD-10-CM

## 2015-01-27 MED ORDER — HYDROCODONE-HOMATROPINE 5-1.5 MG/5ML PO SYRP: 5.0000 mL | ORAL_SOLUTION | Freq: Three times a day (TID) | ORAL | Status: DC | PRN

## 2015-01-27 NOTE — Patient Instructions (Signed)
Flu like illness  Push fluids (you are dry on exam)  Lots of rest  Flu test negative  Powerful cough syrup-no driving until 8 hours after dose  Return if febrile again, return of symptoms, if diarrhea resolves but cough persists (could consider prednisone), new or worsening symptoms

## 2015-01-27 NOTE — Progress Notes (Signed)
Garret Reddish, MD Phone: [phone]  Subjective:   Alison Brooks is a 59 y.o. year old very pleasant female patient who presents with the following:  Cough Diarrhea/nausea/vomiting/fatigue/body aches -Husband sick on Thursday with fever. Started with cough Thursday, felt like a ton of bricks that evening with throwing up, diarrhea, cough dry. Mucinex Dm has not helped. Occasional wheeze but sounds like upper airway. Fever up to 103. Overall has improved except for the cough. No more vomiting. Diarrhea has slowed to about 3x a day from much higher previously. Body aches at first. Has lost 5 lbs. Low appetite.   ROS- no shortness of breath, some aching in outer ribs with cough, no chest pain. Endorses some runny nose. No sinus pressure.   Past Medical History- Patient Active Problem List   Diagnosis Date Noted  . 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 Gout 06/28/2014    Priority: Medium  . Vitamin B12 deficiency 06/28/2014    Priority: Medium  . Multiple pulmonary nodules 06/28/2014    Priority: Medium  . Essential hypertension 05/17/2014    Priority: Medium  . RESTLESS LEG SYNDROME, SEVERE 03/14/2009    Priority: Medium  . Chronic interstitial cystitis 01/20/2009    Priority: Medium  . DEPRESSION 03/19/2008    Priority: Medium  . Hyperlipidemia 02/17/2008    Priority: Medium  . Former smoker 12/21/2007    Priority: Medium  . FIBROMYALGIA 04/21/2007    Priority: Medium  . Obesity (BMI 30-39.9) 01/14/2014    Priority: Low  . Lateral epicondylitis (tennis elbow) 01/02/2014    Priority: Low  . GERD (gastroesophageal reflux disease) 03/26/2013    Priority: Low  . LEG CRAMPS 05/16/2009    Priority: Low  . TUBULOVILLOUS ADENOMA, COLON 02/17/2008    Priority: Low  . NEPHROLITHIASIS 02/17/2008    Priority: Low  . MIGRAINE, CLASSICAL W/O INTRACTABLE MIGRAINE 06/09/2007    Priority: Low  . SYMPTOM, MEMORY LOSS  06/09/2007    Priority: Low  . Dyspnea 11/02/2014  . Upper airway cough syndrome 11/02/2014   Medications- reviewed and updated Current Outpatient Prescriptions  Medication Sig Dispense Refill  . allopurinol (ZYLOPRIM) 100 MG tablet Take 1 tablet (100 mg total) by mouth daily. 90 tablet 3  . atorvastatin (LIPITOR) 40 MG tablet Take 1 tablet (40 mg total) by mouth daily. 90 tablet 3  . baclofen (LIORESAL) 10 MG tablet Take 0.5-1 tablets (5-10 mg total) by mouth 3 (three) times daily. 180 each 3  . cyanocobalamin (,VITAMIN B-12,) 1000 MCG/ML injection Inject 1 mL (1,000 mcg total) into the muscle every 30 (thirty) days. 10 mL 1  . diclofenac (VOLTAREN) 75 MG EC tablet Take 1 tablet (75 mg total) by mouth 2 (two) times daily. 60 tablet 2  . FLUoxetine (PROZAC) 40 MG capsule Take 1 capsule (40 mg total) by mouth daily. 90 capsule 3  . lisinopril (PRINIVIL,ZESTRIL) 20 MG tablet Take 1 tablet (20 mg total) by mouth daily. 90 tablet 3  . oxybutynin (DITROPAN-XL) 5 MG 24 hr tablet Take 1 tablet (5 mg total) by mouth 2 (two) times daily. 90 tablet 3  . propranolol (INDERAL) 40 MG tablet Take 1 tablet (40 mg total) by mouth 2 (two) times daily. 180 tablet 3  . topiramate (TOPAMAX) 200 MG tablet Take 1 tablet (200 mg total) by mouth daily. 90 tablet 3   Objective: BP 110/60 mmHg  Pulse 72  Temp(Src) 98.2 F (36.8  C)  Wt 162 lb (73.483 kg) Gen: NAD, fatigued appearing HEENT: nasal turbinates erythematous and swollen with some clear drainage, oropharynx normal without pharyngeal exudate, TM normal bilaterally, Mucous membranes are dry.  CV: RRR no murmurs rubs or gallops Lungs: CTAB no crackles, wheeze, rhonchi. Did have 1 place with wheeze but cleared with cough.  Abdomen: soft/nontender/nondistended/normal bowel sounds. No rebound/guarding.  Ext: no edema Skin: warm, dry, no rash over abdomen.   Flu negative in office (pending input by nursing staff)  Assessment/Plan:  Flu like  illness Advised of symptomatic care (see AVS) including Hycodan for cough  Influenza negative in office but certainly still could be flu but outside treatment window Given reasons for return as per AVS  Meds ordered this encounter  Medications  . HYDROcodone-homatropine (HYCODAN) 5-1.5 MG/5ML syrup    Sig: Take 5 mLs by mouth every 8 (eight) hours as needed for cough.    Dispense:  120 mL    Refill:  0

## 2015-01-30 ENCOUNTER — Telehealth: Payer: Self-pay | Admitting: Family Medicine

## 2015-01-30 DIAGNOSIS — R059 Cough, unspecified: Secondary | ICD-10-CM

## 2015-01-30 DIAGNOSIS — R05 Cough: Secondary | ICD-10-CM

## 2015-01-30 MED ORDER — ALBUTEROL SULFATE HFA 108 (90 BASE) MCG/ACT IN AERS: 2.0000 | INHALATION_SPRAY | Freq: Four times a day (QID) | RESPIRATORY_TRACT | Status: DC | PRN

## 2015-01-30 NOTE — Telephone Encounter (Signed)
Pt states she is no better and she is more congested, she is coughing so bad that she cant catch her breath at times, cough med not helping.

## 2015-01-30 NOTE — Telephone Encounter (Signed)
Pt was seen 4-4 and no better. [facility]

## 2015-01-30 NOTE — Telephone Encounter (Signed)
Set her up for CXR to rule out pneumonia. If this is viral, there is little we can do other than treat symptoms. can trial albuterol for the cough and feelings that she can't catch her breath. If persists into next week and CXR normal, let's have her back in for reevaluation.

## 2015-01-31 ENCOUNTER — Ambulatory Visit (INDEPENDENT_AMBULATORY_CARE_PROVIDER_SITE_OTHER)
Admission: RE | Admit: 2015-01-31 | Discharge: 2015-01-31 | Disposition: A | Discharge status: Home / Self Care | Payer: Medicare Other | Source: Ambulatory Visit | Attending: Family Medicine | Admitting: Family Medicine

## 2015-01-31 ENCOUNTER — Telehealth: Payer: Self-pay | Admitting: Family Medicine

## 2015-01-31 DIAGNOSIS — R059 Cough, unspecified: Secondary | ICD-10-CM

## 2015-01-31 DIAGNOSIS — J449 Chronic obstructive pulmonary disease, unspecified: Secondary | ICD-10-CM | POA: Diagnosis not present

## 2015-01-31 DIAGNOSIS — R05 Cough: Secondary | ICD-10-CM | POA: Diagnosis not present

## 2015-01-31 MED ORDER — DOXYCYCLINE HYCLATE 100 MG PO TABS: 100.0000 mg | ORAL_TABLET | Freq: Two times a day (BID) | ORAL | Status: DC

## 2015-01-31 MED ORDER — PREDNISONE 50 MG PO TABS: 50.0000 mg | ORAL_TABLET | Freq: Every day | ORAL | Status: DC

## 2015-01-31 NOTE — Telephone Encounter (Signed)
Dr. Yong Channel has not reveiwed anything yet. Once reviewed he will contact pt.

## 2015-01-31 NOTE — Telephone Encounter (Signed)
Pt.notified

## 2015-01-31 NOTE — Telephone Encounter (Signed)
See my chart message

## 2015-01-31 NOTE — Telephone Encounter (Signed)
Patient would like to know the results to her x-rays.

## 2015-02-03 NOTE — Telephone Encounter (Signed)
keba this is for you.

## 2015-02-03 NOTE — Telephone Encounter (Signed)
mychart message seen Saturday.

## 2015-02-04 ENCOUNTER — Encounter: Payer: Self-pay | Admitting: Adult Health

## 2015-02-04 ENCOUNTER — Ambulatory Visit (INDEPENDENT_AMBULATORY_CARE_PROVIDER_SITE_OTHER): Payer: Medicare Other | Admitting: Adult Health

## 2015-02-04 VITALS — BP 124/70 | HR 75 | Temp 97.7°F | Ht 63.0 in | Wt 170.0 lb

## 2015-02-04 DIAGNOSIS — R05 Cough: Secondary | ICD-10-CM | POA: Diagnosis not present

## 2015-02-04 DIAGNOSIS — J45901 Unspecified asthma with (acute) exacerbation: Secondary | ICD-10-CM | POA: Diagnosis not present

## 2015-02-04 DIAGNOSIS — R0602 Shortness of breath: Secondary | ICD-10-CM | POA: Diagnosis not present

## 2015-02-04 DIAGNOSIS — R058 Other specified cough: Secondary | ICD-10-CM

## 2015-02-04 MED ORDER — NEBIVOLOL HCL 10 MG PO TABS: 10.0000 mg | ORAL_TABLET | Freq: Every day | ORAL | Status: DC

## 2015-02-04 NOTE — Assessment & Plan Note (Signed)
Slow to resolve asthmatic bronchitic exacerbation with upper airway cough Suspect cough was previously been aggravated by ACE inhibitor. She is also Inderal which may be aggravating her cough and wheezing. Recent chest x-ray with no sign of pneumonia. For now , stop Inderal changed to Bystolic Work on cough control Prevent cough. Triggers Have her return for PFTs If coughing persists, will need to check a CT sinus  Plan  finish doxycycline and prednisone as directed Begin Mucinex DM twice daily. May use your Hydromet as needed for cough, may make you sleepy Stop Inderal. Begin Bystolic 10mg  daily .  Use ProAir 2 puffs every 4hs as needed for wheezing/shortness of breath.  follow up Dr. Melvyn Novas  In 4 weeks with PFT  Please contact office for sooner follow up if symptoms do not improve or worsen or seek emergency care

## 2015-02-04 NOTE — Progress Notes (Signed)
Subjective:     Patient ID: Alison Brooks, female   DOB: 03-20-1956,     MRN: [id number]  HPI  89 yowf  Quit smoking 2009 with no apparently sequealae good aerobic tolerance at 110-115 lb then by 2014 was experiencing doe x flight of steps but also episodes of sob sitting still with palpitations s warning x sev hours sev times a week assoc with elevated bp > Jenkins eval > CT > mpn > repeat 09/2014 > more MPNs self referred to pulmonary clinic 11/01/2014    11/01/2014 1st [facility] office visit/ [facility]   Chief Complaint  Patient presents with  . Advice Only    self referral - SOB, chest pain, Lung nodules (4) showed up on CT scan done 10/07/14  no h/o ca, rheumatologic dz/ histo exposure.  For at least a year and about once a week still has same spells of resting sob s provocation assoc with palp/ no diaph or nausea or presycope   and non-variable /proportionate doe x one flight of steps, no longer exercising but ex never brought on the same spells she has at rest. Never has noct  / denies excess caffeine or decongestant use.  Dry hacking cough x 2 years sometimes during the day with urge to clear throat, no sign sputum. >>d/c ACE   02/04/2015 Acute OV  Pt returns for an acute office visit.  Complains over last 3 weeks with cough, wheezing , dyspnea, DOE. , flu like illness with fever.  Husband with similar symptoms.  Seen by PCP on 4/4 , tx conservatively.  Then went for CXR 4/8  w/ no acute process. tx w/ doxycycline and prednisone  She was seen 10/2014 for initial office for dyspnea and lung nodules.  She was taken off ACE due to cough .  Last ct chest showed small lung nodules w/ no acute process, serial CT planned for 03/2015.  Says she never got better.  Is on inderal and this was increased for b/p control as ACE was d/c.  She denies any hemoptysis, chest pain, orthopnea, PND, leg swelling, calf pain, recent travel, unintentional weight loss. She denies any unusual hobbies or  pets. O2 sats ration 100% on walking into exam room.  Current Medications, Allergies, Complete Past Medical History, Past Surgical History, Family History, and Social History were reviewed in [employer] record.  ROS  The following are not active complaints unless bolded sore throat, dysphagia, dental problems, itching, sneezing,  nasal congestion or excess/ purulent secretions, ear ache,   fever, chills, sweats, unintended wt loss, pleuritic or exertional cp, hemoptysis,  orthopnea pnd or leg swelling, presyncope, , heartburn, abdominal pain, anorexia, nausea, vomiting, diarrhea  or change in bowel or urinary habits, change in stools or urine, dysuria,hematuria,  rash, arthralgias, visual complaints, headache, numbness weakness or ataxia or problems with walking or coordination,  change in mood/affect or memory.              Objective:   Physical Exam    Vital signs reviewed   HEENT: nl dentition, turbinates, and orophanx. Nl external ear canals without cough reflex   NECK :  without JVD/Nodes/TM/ nl carotid upstrokes bilaterally   LUNGS: no acc muscle use, barking cough. No wheezing noted   CV:  RRR  no s3 or murmur or increase in P2, no edema   ABD:  soft and nontender with nl excursion in the supine position. No bruits or organomegaly, bowel sounds nl  MS:  warm without deformities, calf tenderness, cyanosis or clubbing  SKIN: warm and dry without lesions    NEURO:  alert, approp, no deficits       Assessment:

## 2015-02-04 NOTE — Patient Instructions (Addendum)
Finish doxycycline and prednisone as directed Begin Mucinex DM twice daily. May use your Hydromet as needed for cough, may make you sleepy Stop Inderal. Begin Bystolic 10mg  daily .  Use ProAir 2 puffs every 4hs as needed for wheezing/shortness of breath.  follow up Dr. Melvyn Novas  In 4 weeks with PFT  Please contact office for sooner follow up if symptoms do not improve or worsen or seek emergency care

## 2015-02-04 NOTE — Progress Notes (Signed)
Chart notes/ office note reviewed and agree with a/p as outlined

## 2015-02-04 NOTE — Addendum Note (Signed)
Addended by: Levander Campion on: 02/04/2015 04:24 PM   Modules accepted: Orders, Medications

## 2015-02-05 DIAGNOSIS — R0602 Shortness of breath: Secondary | ICD-10-CM

## 2015-02-05 DIAGNOSIS — J45901 Unspecified asthma with (acute) exacerbation: Secondary | ICD-10-CM | POA: Diagnosis not present

## 2015-02-05 DIAGNOSIS — R05 Cough: Secondary | ICD-10-CM | POA: Diagnosis not present

## 2015-02-05 MED ORDER — LEVALBUTEROL HCL 0.63 MG/3ML IN NEBU
0.6300 mg | INHALATION_SOLUTION | Freq: Once | RESPIRATORY_TRACT | Status: AC
  Administered 2015-02-05: 0.63 mg via RESPIRATORY_TRACT

## 2015-02-05 NOTE — Addendum Note (Signed)
Addended by: Parke Poisson E on: 02/05/2015 10:53 AM   Modules accepted: Orders

## 2015-03-06 ENCOUNTER — Ambulatory Visit (INDEPENDENT_AMBULATORY_CARE_PROVIDER_SITE_OTHER): Payer: Medicare Other | Admitting: Internal Medicine

## 2015-03-06 ENCOUNTER — Encounter: Payer: Self-pay | Admitting: Internal Medicine

## 2015-03-06 VITALS — BP 140/78 | HR 86 | Ht 62.5 in | Wt 171.0 lb

## 2015-03-06 DIAGNOSIS — R05 Cough: Secondary | ICD-10-CM

## 2015-03-06 DIAGNOSIS — E669 Obesity, unspecified: Secondary | ICD-10-CM

## 2015-03-06 DIAGNOSIS — R058 Other specified cough: Secondary | ICD-10-CM

## 2015-03-06 DIAGNOSIS — I10 Essential (primary) hypertension: Secondary | ICD-10-CM | POA: Diagnosis not present

## 2015-03-06 LAB — PULMONARY FUNCTION TEST
DL/VA % PRED: 84 %
DL/VA: 3.89 ml/min/mmHg/L
DLCO UNC % PRED: 65 %
DLCO unc: 14.49 ml/min/mmHg
FEF 25-75 PRE: 1.39 L/s
FEF 25-75 Post: 2.08 L/sec
FEF2575-%Change-Post: 48 %
FEF2575-%PRED-PRE: 63 %
FEF2575-%Pred-Post: 93 %
FEV1-%Change-Post: 11 %
FEV1-%Pred-Post: 91 %
FEV1-%Pred-Pre: 81 %
FEV1-Post: 2.17 L
FEV1-Pre: 1.95 L
FEV1FVC-%Change-Post: 5 %
FEV1FVC-%Pred-Pre: 94 %
FEV6-%CHANGE-POST: 6 %
FEV6-%PRED-POST: 94 %
FEV6-%PRED-PRE: 88 %
FEV6-POST: 2.81 L
FEV6-Pre: 2.63 L
FEV6FVC-%CHANGE-POST: 0 %
FEV6FVC-%PRED-POST: 104 %
FEV6FVC-%Pred-Pre: 103 %
FVC-%Change-Post: 5 %
FVC-%Pred-Post: 90 %
FVC-%Pred-Pre: 85 %
FVC-POST: 2.81 L
FVC-PRE: 2.65 L
POST FEV6/FVC RATIO: 100 %
PRE FEV1/FVC RATIO: 74 %
Post FEV1/FVC ratio: 77 %
Pre FEV6/FVC Ratio: 99 %
RV % pred: 88 %
RV: 1.71 L
TLC % pred: 88 %
TLC: 4.26 L

## 2015-03-06 MED ORDER — BISOPROLOL FUMARATE 5 MG PO TABS: 5.0000 mg | ORAL_TABLET | Freq: Every day | ORAL | Status: DC

## 2015-03-06 MED ORDER — FAMOTIDINE 20 MG PO TABS: ORAL_TABLET | ORAL | Status: DC

## 2015-03-06 MED ORDER — PREDNISONE 10 MG PO TABS: ORAL_TABLET | ORAL | Status: DC

## 2015-03-06 MED ORDER — PANTOPRAZOLE SODIUM 40 MG PO TBEC: 40.0000 mg | DELAYED_RELEASE_TABLET | Freq: Every day | ORAL | Status: DC

## 2015-03-06 NOTE — Assessment & Plan Note (Signed)
pfts 03/06/2015 with ERV disproportionate at 49% c/w effects of obesity on lung volumes / reviewed

## 2015-03-06 NOTE — Patient Instructions (Addendum)
When you finish bystolic  Bisoprolol 5 mg one daily   Pantoprazole (protonix) 40 mg   Take 30-60 min before first meal of the day and Pepcid 20 mg one bedtime until return to office - this is the best way to tell whether stomach acid is contributing to your problem.    GERD (REFLUX)  is an extremely common cause of respiratory symptoms just like yours , many times with no obvious heartburn at all.    It can be treated with medication, but also with lifestyle changes including avoidance of late meals, excessive alcohol, smoking cessation, and avoid fatty foods, chocolate, peppermint, colas, red wine, and acidic juices such as orange juice.  NO MINT OR MENTHOL PRODUCTS SO NO COUGH DROPS  USE SUGARLESS CANDY INSTEAD (Jolley ranchers or Stover's or Life Savers) or even ice chips will also do - the key is to swallow to prevent all throat clearing. NO OIL BASED VITAMINS - use powdered substitutes.   Prednisone 10 mg take  4 each am x 2 days,   2 each am x 2 days,  1 each am x 2 days and stop   For drainage take chlortrimeton (chlorpheniramine) 4 mg every 4 hours available over the counter (may cause drowsiness)   For cough take delsym 2 tsp every 12 hours as needed   Please schedule a follow up office visit in 4 weeks, sooner if needed

## 2015-03-06 NOTE — Progress Notes (Signed)
Subjective:     Patient ID: Alison Brooks, female   DOB: [birth year]     MRN: [id number]    Brief patient profile:  45 yowf  Quit smoking 2009 with no apparently sequealae good aerobic tolerance at 110-115 lb then by 2014 was experiencing doe x flight of steps but also episodes of sob sitting still with palpitations s warning x sev hours sev times a week assoc with elevated bp > Jenkins eval > CT > mpn > repeat 09/2014 > more MPNs self referred to pulmonary clinic 11/01/2014     History of Present Illness  11/01/2014 1st [facility] office visit/ Bindi Klomp   Chief Complaint  Patient presents with  . Advice Only    self referral - SOB, chest pain, Lung nodules (4) showed up on CT scan done 10/07/14  no h/o ca, rheumatologic dz/ histo exposure. For at least a year and about once a week still has same spells of resting sob s provocation assoc with palp/ no diaph or nausea or presycope   and non-variable /proportionate doe x one flight of steps, no longer exercising but ex never brought on the same spells she has at rest. Never has noct  / denies excess caffeine or decongestant use. Dry hacking cough x 2 years sometimes during the day with urge to clear throat, no sign sputum. >>d/c ACEi    02/04/2015 NP Acute OV  Pt returns for an acute office visit.  Complains over last 3 weeks with cough, wheezing , dyspnea, DOE. , flu like illness with fever.  Husband with similar symptoms.  Seen by PCP on 4/4 , tx conservatively.  Then went for CXR 4/8  w/ no acute process. tx w/ doxycycline and prednisone  She was seen 10/2014 for initial office for dyspnea and lung nodules.  She was taken off ACE due to cough .  Last ct chest showed small lung nodules w/ no acute process, serial CT planned for 03/2015.  Says she never got better.  Is on inderal and this was increased for b/p control as ACE was d/c.  She denies any hemoptysis, chest pain, orthopnea, PND, leg swelling, calf pain, recent travel,  unintentional weight loss. She denies any unusual hobbies or pets. O2 sats  100% on walking into exam room. rec Finish doxycycline and prednisone as directed Begin Mucinex DM twice daily. May use your Hydromet as needed for cough, may make you sleepy Stop Inderal. Begin Bystolic 10mg  daily .  Use ProAir 2 puffs every 4hs as needed for wheezing/shortness of breath.     03/06/2015 f/u ov/Neeko Pharo re: uacs with nl pfts  Chief Complaint  Patient presents with  . Follow-up    PFT done today. Breathing has improved but not back at her normal baseline. She is using rescue inhaler 2 x per wk on average. She had some wheezing when working outside pulling weeds. Cough is only slightly improved and is non prod.   breathing has not felt right in 2 years, much worse bending over or talking but sleeping ok  No obvious other patterns  day to day or daytime variabilty or assoc   cp or chest tightness, subjective wheeze overt sinus or hb symptoms. No unusual exp hx or h/o childhood pna/ asthma or knowledge of premature birth.  Sleeping ok without nocturnal  or early am exacerbation  of respiratory  c/o's or need for noct saba. Also denies any obvious fluctuation of symptoms with weather or environmental changes or other aggravating  or alleviating factors except as outlined above   Current Medications, Allergies, Complete Past Medical History, Past Surgical History, Family History, and Social History were reviewed in [employer] record.  ROS  The following are not active complaints unless bolded sore throat, dysphagia, dental problems, itching, sneezing,  nasal congestion or excess/ purulent secretions, ear ache,   fever, chills, sweats, unintended wt loss, pleuritic or exertional cp, hemoptysis,  orthopnea pnd or leg swelling, presyncope, palpitations, heartburn, abdominal pain, anorexia, nausea, vomiting, diarrhea  or change in bowel or urinary habits, change in stools or urine,  dysuria,hematuria,  rash, arthralgias, visual complaints, headache, numbness weakness or ataxia or problems with walking or coordination,  change in mood/affect or memory.                       Objective:   Physical Exam      Wt Readings from Last 3 Encounters:  03/06/15 171 lb (77.565 kg)  02/04/15 170 lb (77.111 kg)  01/27/15 162 lb (73.483 kg)    Vital signs reviewed    HEENT: nl dentition, turbinates, and orophanx. Nl external ear canals without cough reflex   NECK :  without JVD/Nodes/TM/ nl carotid upstrokes bilaterally   LUNGS: no acc muscle use, barking cough. No wheezing noted   CV:  RRR  no s3 or murmur or increase in P2, no edema   ABD:  soft and nontender with nl excursion in the supine position. No bruits or organomegaly, bowel sounds nl  MS:  warm without deformities, calf tenderness, cyanosis or clubbing  SKIN: warm and dry without lesions    NEURO:  alert, approp, no deficits     I personally reviewed images and agree with radiology impression as follows:  CXR:  01/31/15 COPD. There is no pneumonia, CHF, nor other acute cardiopulmonary disease.        Assessment:

## 2015-03-06 NOTE — Assessment & Plan Note (Addendum)
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  Therefore  maintaining smoking abstinence is the most important aspect of care, not choice of inhalers or for that matter, doctors.     2) there is not reason she can't be "back like she was 2 years ago" except for the wt issue which will take longer to correct that the upper airway compoent  3) UACS is exac by acei (and note ncessarily caused by it) and takes 6 weeks to sort out and she's been off x 4 weeks so at this point reasonable add max gerd rx and eliminate pnds with  1st gen h1 if tolerated  4) f/u in 4 weeks to see if we have eliminated her symptoms

## 2015-03-06 NOTE — Assessment & Plan Note (Signed)
Lisinopril 20mg . Propranolol 40mg  BID > both d/c'd due to respiratory complaints as of 4/83/47 - bystolic 10 mg daily 5/83/07 > 03/06/2015 and changed to bisoprolol 5 mg daily due to cost issues

## 2015-03-06 NOTE — Progress Notes (Signed)
PFT done today. 

## 2015-03-12 ENCOUNTER — Encounter: Payer: Self-pay | Admitting: Internal Medicine

## 2015-03-26 ENCOUNTER — Other Ambulatory Visit: Payer: Self-pay | Admitting: Internal Medicine

## 2015-03-26 ENCOUNTER — Telehealth: Payer: Self-pay | Admitting: Internal Medicine

## 2015-03-26 DIAGNOSIS — R918 Other nonspecific abnormal finding of lung field: Secondary | ICD-10-CM

## 2015-03-26 NOTE — Telephone Encounter (Signed)
Patient received call from our [facility] office and was scheduled for CT. Nothing further needed.

## 2015-04-04 ENCOUNTER — Ambulatory Visit: Payer: Medicare Other | Admitting: Internal Medicine

## 2015-04-10 ENCOUNTER — Encounter: Payer: Self-pay | Admitting: Family Medicine

## 2015-04-10 ENCOUNTER — Ambulatory Visit (INDEPENDENT_AMBULATORY_CARE_PROVIDER_SITE_OTHER): Payer: Medicare Other | Admitting: Family Medicine

## 2015-04-10 DIAGNOSIS — E538 Deficiency of other specified B group vitamins: Secondary | ICD-10-CM

## 2015-04-10 DIAGNOSIS — E785 Hyperlipidemia, unspecified: Secondary | ICD-10-CM | POA: Diagnosis not present

## 2015-04-10 DIAGNOSIS — N3010 Interstitial cystitis (chronic) without hematuria: Secondary | ICD-10-CM

## 2015-04-10 DIAGNOSIS — M10072 Idiopathic gout, left ankle and foot: Secondary | ICD-10-CM | POA: Diagnosis not present

## 2015-04-10 LAB — LDL CHOLESTEROL, DIRECT: Direct LDL: 171 mg/dL

## 2015-04-10 LAB — VITAMIN B12: Vitamin B-12: >1500 pg/mL — ABNORMAL HIGH (ref 211–911)

## 2015-04-10 LAB — URIC ACID: URIC ACID, SERUM: 4.1 mg/dL (ref 2.4–7.0)

## 2015-04-10 MED ORDER — CYANOCOBALAMIN 1000 MCG/ML IJ SOLN
1000.0000 ug | Freq: Once | INTRAMUSCULAR | Status: AC
  Administered 2015-04-10: 1000 ug via INTRAMUSCULAR

## 2015-04-10 NOTE — Assessment & Plan Note (Signed)
S: patient returned to home injections after getting last one here. She has run out. She has had an injection at least once a month for last 3 months. B12 much improved A/P: continue monthly injections here. She prefers to get them at home and she is going to price check with pharmacy. If levels remain >1500, may change to every 6 weeks.

## 2015-04-10 NOTE — Patient Instructions (Signed)
Changes only based off labs- no other changes  3 months follow up

## 2015-04-10 NOTE — Assessment & Plan Note (Signed)
S: poor control. Despite increased atorvastatin to 40mg , ldl remains at 170.  A/P: enforced compliance but will have Keba follow up and advise again. Without any budge in LDL, suspicious she is either not taking at all or at least not checking regularly.

## 2015-04-10 NOTE — Assessment & Plan Note (Signed)
S: Uric acid at goal once again at 4. Colchicine prn. 1-2x a month acting up in great toes. Compliant with allopurinol 100mg . I have not witnessed actual flare A/P: hopeful will be able to see flare in future. I wonder given 1-2x a month while with uric acid at 4 if this is truly gout everytime. Continue allopurinol and prn colchicine.

## 2015-04-10 NOTE — Progress Notes (Signed)
Garret Reddish, MD  Subjective:  Alison Brooks is a 59 y.o. year old very pleasant female patient who presents with:  See problem oriented charting ROS-occasionally great toe becomes hot/swollen, no chest pain or shortness fo breath or myalgias,   Past Medical History- chronic low back pain with spinal cord implant, former smoker, depression, restless legs, interstitial cystitis, fibromyalgia, HTN, cough followed by pulm.  Medications- reviewed and updated Current Outpatient Prescriptions  Medication Sig Dispense Refill  . allopurinol (ZYLOPRIM) 100 MG tablet Take 1 tablet (100 mg total) by mouth daily. 90 tablet 3  . atorvastatin (LIPITOR) 40 MG tablet Take 1 tablet (40 mg total) by mouth daily. 90 tablet 3  . baclofen (LIORESAL) 10 MG tablet Take 0.5-1 tablets (5-10 mg total) by mouth 3 (three) times daily. 180 each 3  . bisoprolol (ZEBETA) 5 MG tablet Take 1 tablet (5 mg total) by mouth daily. 30 tablet 11  . cyanocobalamin (,VITAMIN B-12,) 1000 MCG/ML injection Inject 1 mL (1,000 mcg total) into the muscle every 30 (thirty) days. 10 mL 1  . famotidine (PEPCID) 20 MG tablet One at bedtime 30 tablet 2  . FLUoxetine (PROZAC) 40 MG capsule Take 1 capsule (40 mg total) by mouth daily. 90 capsule 3  . oxybutynin (DITROPAN-XL) 5 MG 24 hr tablet Take 1 tablet (5 mg total) by mouth 2 (two) times daily. (Patient taking differently: Take 5 mg by mouth 2 (two) times daily as needed. ) 90 tablet 3  . pantoprazole (PROTONIX) 40 MG tablet Take 1 tablet (40 mg total) by mouth daily. Take 30-60 min before first meal of the day 30 tablet 2  . topiramate (TOPAMAX) 200 MG tablet Take 1 tablet (200 mg total) by mouth daily. 90 tablet 3  . albuterol (PROVENTIL HFA;VENTOLIN HFA) 108 (90 BASE) MCG/ACT inhaler Inhale 2 puffs into the lungs every 6 (six) hours as needed for wheezing or shortness of breath. (Patient not taking: Reported on 04/10/2015) 1 Inhaler 0   No current facility-administered medications  for this visit.    Objective: BP 130/82 mmHg  Pulse 91  Temp(Src) 98.6 F (37 C)  Wt 166 lb (75.297 kg) Gen: NAD, resting comfortably CV: RRR no murmurs rubs or gallops Lungs: CTAB no crackles, wheeze, rhonchi Abdomen: soft/nontender/nondistended/normal bowel sounds.  Ext: no edema, normal great toes bilaterally Skin: warm, dry Neuro: grossly normal, moves all extremities   Assessment/Plan:  Gout S: Uric acid at goal once again at 4. Colchicine prn. 1-2x a month acting up in great toes. Compliant with allopurinol 100mg . I have not witnessed actual flare A/P: hopeful will be able to see flare in future. I wonder given 1-2x a month while with uric acid at 4 if this is truly gout everytime. Continue allopurinol and prn colchicine.    Hyperlipidemia S: poor control. Despite increased atorvastatin to 40mg , ldl remains at 170.  A/P: enforced compliance but will have Keba follow up and advise again. Without any budge in LDL, suspicious she is either not taking at all or at least not checking regularly.    Vitamin B12 deficiency S: patient returned to home injections after getting last one here. She has run out. She has had an injection at least once a month for last 3 months. B12 much improved A/P: continue monthly injections here. She prefers to get them at home and she is going to price check with pharmacy. If levels remain >1500, may change to every 6 weeks.   3 months. May stretch to  4-6 next visit given no longer on methadone.   Orders Placed This Encounter  Procedures  . Uric Acid  . LDL cholesterol, direct    [place]  . Vitamin B12    Meds ordered this encounter  Medications  . cyanocobalamin ((VITAMIN B-12)) injection 1,000 mcg    Sig:

## 2015-05-15 ENCOUNTER — Ambulatory Visit (INDEPENDENT_AMBULATORY_CARE_PROVIDER_SITE_OTHER)
Admission: RE | Admit: 2015-05-15 | Discharge: 2015-05-15 | Disposition: A | Discharge status: Home / Self Care | Payer: Medicare Other | Source: Ambulatory Visit | Attending: Internal Medicine | Admitting: Internal Medicine

## 2015-05-15 DIAGNOSIS — R911 Solitary pulmonary nodule: Secondary | ICD-10-CM | POA: Diagnosis not present

## 2015-05-15 DIAGNOSIS — I2510 Atherosclerotic heart disease of native coronary artery without angina pectoris: Secondary | ICD-10-CM | POA: Diagnosis not present

## 2015-05-15 DIAGNOSIS — R918 Other nonspecific abnormal finding of lung field: Secondary | ICD-10-CM | POA: Diagnosis not present

## 2015-05-15 DIAGNOSIS — J432 Centrilobular emphysema: Secondary | ICD-10-CM | POA: Diagnosis not present

## 2015-05-16 NOTE — Progress Notes (Signed)
Quick Note:  Spoke with pt and notified of results per Dr. Wert. Pt verbalized understanding and denied any questions.  ______ 

## 2015-05-27 DIAGNOSIS — Z01419 Encounter for gynecological examination (general) (routine) without abnormal findings: Secondary | ICD-10-CM | POA: Diagnosis not present

## 2015-05-27 DIAGNOSIS — Z1231 Encounter for screening mammogram for malignant neoplasm of breast: Secondary | ICD-10-CM | POA: Diagnosis not present

## 2015-05-28 ENCOUNTER — Telehealth: Payer: Self-pay | Admitting: Family Medicine

## 2015-05-28 DIAGNOSIS — E669 Obesity, unspecified: Secondary | ICD-10-CM

## 2015-05-28 NOTE — Telephone Encounter (Signed)
See below

## 2015-05-28 NOTE — Telephone Encounter (Signed)
Phentermine is not recommended for people with hypertension and she has hypertension so would not recommend.

## 2015-05-28 NOTE — Telephone Encounter (Signed)
Pt call to say that her gyn doctor told her to ask Dr Yong Channel if it is ok for him Dr Ronita Hipps to prescribe her PHENTERMINE. Per pt Dr Ronita Hipps wants to verify there is no health risk with her taking the medicine.

## 2015-05-29 NOTE — Telephone Encounter (Signed)
Patient informed of the message below and states she wants Dr Yong Channel to tell her what else to do to lose weight?  States she has used this medication before, tried Weight Watchers and cannot get the weight off?  Patient informed message sent to Dr Yong Channel and to expect a call next week when he returns to the office.

## 2015-05-29 NOTE — Telephone Encounter (Signed)
We can refer her to a nutritionist and work on increasing physical exercise hopefully without worsening back pain. We could consider orlistat as well. I have several patients that have been on phentermine previously and do not recommend the medicine for people with hypertension per prescribing guidelines.

## 2015-05-30 MED ORDER — ORLISTAT 120 MG PO CAPS: 120.0000 mg | ORAL_CAPSULE | Freq: Three times a day (TID) | ORAL | Status: DC

## 2015-05-30 NOTE — Addendum Note (Signed)
Addended by: Agnes Lawrence on: 05/30/2015 11:26 AM   Modules accepted: Orders

## 2015-05-30 NOTE — Addendum Note (Signed)
Addended by: [name] on: 05/30/2015 11:37 AM   Modules accepted: Orders

## 2015-05-30 NOTE — Telephone Encounter (Signed)
Patient informed of the message below and states she would like to see the nutritionist and start the Orlistat.  She is aware the referral was placed to see the nutritionist and someone will call with appt info.  Please send Rx to [facility]

## 2015-05-30 NOTE — Telephone Encounter (Signed)
Thanks. Please advise patient that GI side effects are usually most bothersome. I would also advise her to take a multivitamin before bed because she can lose some fat soluble vitamins with meals.

## 2015-05-30 NOTE — Telephone Encounter (Signed)
Left a message for the pt to return my call.  

## 2015-05-30 NOTE — Telephone Encounter (Signed)
Patient informed. 

## 2015-06-04 DIAGNOSIS — R922 Inconclusive mammogram: Secondary | ICD-10-CM | POA: Diagnosis not present

## 2015-06-04 LAB — HM MAMMOGRAPHY

## 2015-06-05 ENCOUNTER — Other Ambulatory Visit: Payer: Self-pay | Admitting: Radiology

## 2015-06-05 DIAGNOSIS — N63 Unspecified lump in breast: Secondary | ICD-10-CM | POA: Diagnosis not present

## 2015-06-05 DIAGNOSIS — D241 Benign neoplasm of right breast: Secondary | ICD-10-CM | POA: Diagnosis not present

## 2015-06-06 ENCOUNTER — Encounter: Payer: Self-pay | Admitting: Family Medicine

## 2015-06-09 ENCOUNTER — Telehealth: Payer: Self-pay | Admitting: Family Medicine

## 2015-06-09 NOTE — Telephone Encounter (Signed)
Perfect thanks for update. Closing visit

## 2015-06-09 NOTE — Telephone Encounter (Signed)
Pt returning your call pls call pt on the home phone.

## 2015-06-09 NOTE — Telephone Encounter (Signed)
Pt has already had biopsy done as of 06/05/15 that came back normal.

## 2015-06-27 ENCOUNTER — Other Ambulatory Visit: Payer: Self-pay | Admitting: Family Medicine

## 2015-07-01 ENCOUNTER — Other Ambulatory Visit: Payer: Self-pay

## 2015-07-01 MED ORDER — BACLOFEN 10 MG PO TABS: 5.0000 mg | ORAL_TABLET | Freq: Three times a day (TID) | ORAL | Status: DC

## 2015-07-01 MED ORDER — OXYBUTYNIN CHLORIDE ER 5 MG PO TB24: 5.0000 mg | ORAL_TABLET | Freq: Two times a day (BID) | ORAL | Status: DC | PRN

## 2015-07-10 ENCOUNTER — Ambulatory Visit: Payer: Medicare Other | Admitting: Family Medicine

## 2015-07-11 ENCOUNTER — Ambulatory Visit: Payer: Medicare Other | Admitting: Family Medicine

## 2015-07-22 ENCOUNTER — Ambulatory Visit: Payer: Medicare Other | Admitting: Family Medicine

## 2015-08-04 ENCOUNTER — Ambulatory Visit (INDEPENDENT_AMBULATORY_CARE_PROVIDER_SITE_OTHER): Payer: Medicare Other | Admitting: Family Medicine

## 2015-08-04 ENCOUNTER — Encounter: Payer: Self-pay | Admitting: Family Medicine

## 2015-08-04 ENCOUNTER — Other Ambulatory Visit: Payer: Self-pay | Admitting: Family Medicine

## 2015-08-04 VITALS — BP 140/80 | HR 86 | Temp 98.6°F | Wt 171.0 lb

## 2015-08-04 DIAGNOSIS — Z23 Encounter for immunization: Secondary | ICD-10-CM

## 2015-08-04 DIAGNOSIS — E785 Hyperlipidemia, unspecified: Secondary | ICD-10-CM

## 2015-08-04 DIAGNOSIS — Z20828 Contact with and (suspected) exposure to other viral communicable diseases: Secondary | ICD-10-CM

## 2015-08-04 DIAGNOSIS — I10 Essential (primary) hypertension: Secondary | ICD-10-CM | POA: Diagnosis not present

## 2015-08-04 DIAGNOSIS — N3010 Interstitial cystitis (chronic) without hematuria: Secondary | ICD-10-CM | POA: Diagnosis not present

## 2015-08-04 DIAGNOSIS — E538 Deficiency of other specified B group vitamins: Secondary | ICD-10-CM | POA: Diagnosis not present

## 2015-08-04 MED ORDER — PANTOPRAZOLE SODIUM 40 MG PO TBEC: 40.0000 mg | DELAYED_RELEASE_TABLET | Freq: Every day | ORAL | Status: DC

## 2015-08-04 MED ORDER — OXYBUTYNIN CHLORIDE 5 MG PO TABS: 5.0000 mg | ORAL_TABLET | Freq: Two times a day (BID) | ORAL | Status: DC | PRN

## 2015-08-04 MED ORDER — BISOPROLOL FUMARATE 5 MG PO TABS: 5.0000 mg | ORAL_TABLET | Freq: Every day | ORAL | Status: DC

## 2015-08-04 MED ORDER — FAMOTIDINE 20 MG PO TABS: ORAL_TABLET | ORAL | Status: DC

## 2015-08-04 MED ORDER — CYANOCOBALAMIN 1000 MCG/ML IJ SOLN
1000.0000 ug | Freq: Once | INTRAMUSCULAR | Status: AC
  Administered 2015-08-04: 1000 ug via INTRAMUSCULAR

## 2015-08-04 NOTE — Assessment & Plan Note (Signed)
S: poor control on atorvastatin 40mg , states compliant daily Lab Results  Component Value Date   CHOL 278* 09/03/2013   HDL 55.80 09/03/2013   LDLDIRECT 171.0 04/10/2015   TRIG 121.0 09/03/2013   CHOLHDL 5 09/03/2013  A/P: continue current rx. If develops indication for secondary prevention, consider change to crestor 40mg  or atorvastatin 80mg 

## 2015-08-04 NOTE — Assessment & Plan Note (Signed)
S: mild poor control on bisoprolol alone. Weight has also trended up. Patient also under great deal of stress. Husbands brother not well mentally- caring for him BP Readings from Last 3 Encounters:  08/04/15 140/80  04/10/15 130/82  03/06/15 140/78  A/P: technically at goal per [facility] but I told patient at her age would much prefer <140/90. 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 If remains elevated- could increase bisoprolol to 10mg  (could even go up to 20mg ). Also discussed weight trend not helping BP and need to reverse trend

## 2015-08-04 NOTE — Assessment & Plan Note (Signed)
S: reasonable control with prn oxybutynin. Oxybutynin XL 5mg  BID- has some trouble with insurance.  A/P: change to non extended release version and see how patient tolerates. Could write Prior auth if needed

## 2015-08-04 NOTE — Progress Notes (Signed)
Garret Reddish, MD  Subjective:  Alison Brooks is a 59 y.o. year old very pleasant female patient who presents for/with See problem oriented charting ROS- no chest pain or shortness of breath or dyspnea on exertion. Occasional mild lightheadedness when stress/anxiety levels get higher but rare  Past Medical History-  Patient Active Problem List   Diagnosis Date Noted  . 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 Gout 06/28/2014    Priority: Medium  . Vitamin B12 deficiency 06/28/2014    Priority: Medium  . Multiple pulmonary nodules 06/28/2014    Priority: Medium  . Essential hypertension 05/17/2014    Priority: Medium  . RESTLESS LEG SYNDROME, SEVERE 03/14/2009    Priority: Medium  . Chronic interstitial cystitis 01/20/2009    Priority: Medium  . DEPRESSION 03/19/2008    Priority: Medium  . Hyperlipidemia 02/17/2008    Priority: Medium  . Former smoker 12/21/2007    Priority: Medium  . FIBROMYALGIA 04/21/2007    Priority: Medium  . Obesity (BMI 30-39.9) 01/14/2014    Priority: Low  . Lateral epicondylitis (tennis elbow) 01/02/2014    Priority: Low  . GERD (gastroesophageal reflux disease) 03/26/2013    Priority: Low  . LEG CRAMPS 05/16/2009    Priority: Low  . TUBULOVILLOUS ADENOMA, COLON 02/17/2008    Priority: Low  . NEPHROLITHIASIS 02/17/2008    Priority: Low  . MIGRAINE, CLASSICAL W/O INTRACTABLE MIGRAINE 06/09/2007    Priority: Low  . SYMPTOM, MEMORY LOSS 06/09/2007    Priority: Low  . Dyspnea 11/02/2014  . Upper airway cough syndrome 11/02/2014    Medications- reviewed and updated Current Outpatient Prescriptions  Medication Sig Dispense Refill  . albuterol (PROVENTIL HFA;VENTOLIN HFA) 108 (90 BASE) MCG/ACT inhaler Inhale 2 puffs into the lungs every 6 (six) hours as needed for wheezing or shortness of breath. (Patient not taking: Reported on 04/10/2015) 1 Inhaler 0  . allopurinol (ZYLOPRIM) 100 MG  tablet Take 1 tablet (100 mg total) by mouth daily. 90 tablet 3  . atorvastatin (LIPITOR) 40 MG tablet Take 1 tablet (40 mg total) by mouth daily. 90 tablet 3  . baclofen (LIORESAL) 10 MG tablet Take 0.5-1 tablets (5-10 mg total) by mouth 3 (three) times daily. 180 each 2  . bisoprolol (ZEBETA) 5 MG tablet Take 1 tablet (5 mg total) by mouth daily. 90 tablet 3  . cyanocobalamin (,VITAMIN B-12,) 1000 MCG/ML injection Inject 1 mL (1,000 mcg total) into the muscle every 30 (thirty) days. 10 mL 1  . famotidine (PEPCID) 20 MG tablet One at bedtime 90 tablet 3  . FLUoxetine (PROZAC) 40 MG capsule Take 1 capsule (40 mg total) by mouth daily. 90 capsule 3  . orlistat (XENICAL) 120 MG capsule Take 1 capsule (120 mg total) by mouth 3 (three) times daily with meals. 90 capsule 5  . oxybutynin (DITROPAN) 5 MG tablet  XL before visit Normal after visit Take 1 tablet (5 mg total) by mouth 2 (two) times daily as needed for bladder spasms. 180 tablet 1  . pantoprazole (PROTONIX) 40 MG tablet Take 1 tablet (40 mg total) by mouth daily. Take 30-60 min before first meal of the day 90 tablet 3  . topiramate (TOPAMAX) 200 MG tablet Take 1 tablet (200 mg total) by mouth daily. 90 tablet 3   No current facility-administered medications for this visit.    Objective: BP 140/80 mmHg  Pulse 86  Temp(Src) 98.6 F (37  C)  Wt 171 lb (77.565 kg) Gen: NAD, resting comfortably CV: RRR no murmurs rubs or gallops Lungs: CTAB no crackles, wheeze, rhonchi Abdomen: soft/nontender/nondistended/normal bowel sounds. No rebound or guarding.  Ext: no edema Skin: warm, dry Neuro: grossly normal, moves all extremities  Assessment/Plan:  Chronic interstitial cystitis S: reasonable control with prn oxybutynin. Oxybutynin XL 5mg  BID- has some trouble with insurance.  A/P: change to non extended release version and see how patient tolerates. Could write Prior auth if needed   Essential hypertension S: mild poor control on  bisoprolol alone. Weight has also trended up. Patient also under great deal of stress. Husbands brother not well mentally- caring for him BP Readings from Last 3 Encounters:  08/04/15 140/80  04/10/15 130/82  03/06/15 140/78  A/P: technically at goal per [facility] but I told patient at her age would much prefer <140/90. 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 If remains elevated- could increase bisoprolol to 10mg  (could even go up to 20mg ). Also discussed weight trend not helping BP and need to reverse trend   Hyperlipidemia S: poor control on atorvastatin 40mg , states compliant daily Lab Results  Component Value Date   CHOL 278* 09/03/2013   HDL 55.80 09/03/2013   LDLDIRECT 171.0 04/10/2015   TRIG 121.0 09/03/2013   CHOLHDL 5 09/03/2013  A/P: continue current rx. If develops indication for secondary prevention, consider change to crestor 40mg  or atorvastatin 80mg   3-6 month awv. Return precautions advised.   Orders Placed This Encounter  Procedures  . Flu Vaccine QUAD 36+ mos IM  . Hepatitis C antibody, reflex    [facility]  . HIV antibody    [facility]    Meds ordered this encounter  Medications  . cyanocobalamin ((VITAMIN B-12)) injection 1,000 mcg    Sig:   . oxybutynin (DITROPAN) 5 MG tablet    Sig: Take 1 tablet (5 mg total) by mouth 2 (two) times daily as needed for bladder spasms.    Dispense:  180 tablet    Refill:  1  . pantoprazole (PROTONIX) 40 MG tablet    Sig: Take 1 tablet (40 mg total) by mouth daily. Take 30-60 min before first meal of the day    Dispense:  90 tablet    Refill:  3  . famotidine (PEPCID) 20 MG tablet    Sig: One at bedtime    Dispense:  90 tablet    Refill:  3  . bisoprolol (ZEBETA) 5 MG tablet    Sig: Take 1 tablet (5 mg total) by mouth daily.    Dispense:  90 tablet    Refill:  3

## 2015-08-04 NOTE — Patient Instructions (Addendum)
Flu shot and b12 received today.  Your blood pressure concerns me. I would much prefer you <140/90.  I would like for you to buy/use a home cuff to check at least 4x a week.  If you note in the next few weeks that it is higher than our goal, see me sooner. Otherwise, see me at next available annual wellness visit within 3-6 months. Bring your home cuff and your log of blood pressures with you to visit.   Today Health Maintenance Due  Topic Date Due  . Hepatitis C Screening  09/02/1956  . HIV Screening  [birth year]/21/1970   Refilled bisoprolol, famotidine and pantoprazole through [facility]

## 2015-08-05 LAB — HEPATITIS C ANTIBODY: HCV Ab: NEGATIVE

## 2015-08-05 LAB — HIV ANTIBODY (ROUTINE TESTING W REFLEX): HIV: NONREACTIVE

## 2015-08-11 ENCOUNTER — Other Ambulatory Visit: Payer: Self-pay

## 2015-08-27 DIAGNOSIS — M79641 Pain in right hand: Secondary | ICD-10-CM | POA: Diagnosis not present

## 2015-09-23 ENCOUNTER — Other Ambulatory Visit: Payer: Self-pay | Admitting: Family Medicine

## 2015-10-20 DIAGNOSIS — M659 Synovitis and tenosynovitis, unspecified: Secondary | ICD-10-CM | POA: Diagnosis not present

## 2015-10-31 ENCOUNTER — Other Ambulatory Visit: Payer: Medicare Other

## 2015-11-04 ENCOUNTER — Encounter: Payer: Medicare Other | Admitting: Family Medicine

## 2015-11-11 ENCOUNTER — Other Ambulatory Visit: Payer: Self-pay | Admitting: Family Medicine

## 2015-11-17 DIAGNOSIS — M659 Synovitis and tenosynovitis, unspecified: Secondary | ICD-10-CM | POA: Diagnosis not present

## 2015-11-24 ENCOUNTER — Other Ambulatory Visit: Payer: Self-pay | Admitting: Orthopedic Surgery

## 2015-11-24 DIAGNOSIS — M25512 Pain in left shoulder: Secondary | ICD-10-CM

## 2015-11-26 DIAGNOSIS — M545 Low back pain: Secondary | ICD-10-CM | POA: Diagnosis not present

## 2015-11-26 DIAGNOSIS — M2550 Pain in unspecified joint: Secondary | ICD-10-CM | POA: Diagnosis not present

## 2015-11-26 DIAGNOSIS — G8929 Other chronic pain: Secondary | ICD-10-CM | POA: Diagnosis not present

## 2015-11-26 DIAGNOSIS — M7989 Other specified soft tissue disorders: Secondary | ICD-10-CM | POA: Diagnosis not present

## 2015-12-08 ENCOUNTER — Ambulatory Visit
Admission: RE | Admit: 2015-12-08 | Discharge: 2015-12-08 | Disposition: A | Discharge status: Home / Self Care | Payer: Medicare Other | Source: Ambulatory Visit | Attending: Orthopedic Surgery | Admitting: Orthopedic Surgery

## 2015-12-08 DIAGNOSIS — M25512 Pain in left shoulder: Secondary | ICD-10-CM

## 2015-12-08 DIAGNOSIS — S43432A Superior glenoid labrum lesion of left shoulder, initial encounter: Secondary | ICD-10-CM | POA: Diagnosis not present

## 2015-12-08 MED ORDER — IOHEXOL 180 MG/ML  SOLN
15.0000 mL | Freq: Once | INTRAMUSCULAR | Status: AC | PRN
  Administered 2015-12-08: 15 mL via INTRA_ARTICULAR

## 2015-12-12 DIAGNOSIS — M659 Synovitis and tenosynovitis, unspecified: Secondary | ICD-10-CM | POA: Diagnosis not present

## 2015-12-17 DIAGNOSIS — M5412 Radiculopathy, cervical region: Secondary | ICD-10-CM | POA: Diagnosis not present

## 2015-12-17 DIAGNOSIS — M25512 Pain in left shoulder: Secondary | ICD-10-CM | POA: Diagnosis not present

## 2015-12-19 DIAGNOSIS — M5412 Radiculopathy, cervical region: Secondary | ICD-10-CM | POA: Diagnosis not present

## 2015-12-24 HISTORY — PX: SHOULDER ARTHROSCOPY WITH BICEPS TENDON REPAIR: SHX5674

## 2015-12-25 ENCOUNTER — Other Ambulatory Visit (INDEPENDENT_AMBULATORY_CARE_PROVIDER_SITE_OTHER): Payer: Medicare Other

## 2015-12-25 DIAGNOSIS — M150 Primary generalized (osteo)arthritis: Secondary | ICD-10-CM | POA: Diagnosis not present

## 2015-12-25 DIAGNOSIS — G8929 Other chronic pain: Secondary | ICD-10-CM | POA: Diagnosis not present

## 2015-12-25 DIAGNOSIS — M7989 Other specified soft tissue disorders: Secondary | ICD-10-CM | POA: Diagnosis not present

## 2015-12-25 DIAGNOSIS — Z0000 Encounter for general adult medical examination without abnormal findings: Secondary | ICD-10-CM | POA: Diagnosis not present

## 2015-12-25 DIAGNOSIS — M545 Low back pain: Secondary | ICD-10-CM | POA: Diagnosis not present

## 2015-12-25 DIAGNOSIS — M2550 Pain in unspecified joint: Secondary | ICD-10-CM | POA: Diagnosis not present

## 2015-12-25 LAB — POC URINALSYSI DIPSTICK (AUTOMATED)
BILIRUBIN UA: NEGATIVE
GLUCOSE UA: NEGATIVE
KETONES UA: NEGATIVE
Leukocytes, UA: NEGATIVE
Nitrite, UA: NEGATIVE
Protein, UA: NEGATIVE
SPEC GRAV UA: 1.025
Urobilinogen, UA: 0.2
pH, UA: 5.5

## 2015-12-25 LAB — CBC WITH DIFFERENTIAL/PLATELET
BASOS ABS: 0 10*3/uL (ref 0.0–0.1)
BASOS PCT: 0.4 % (ref 0.0–3.0)
EOS ABS: 0.1 10*3/uL (ref 0.0–0.7)
Eosinophils Relative: 1.4 % (ref 0.0–5.0)
HEMATOCRIT: 38.9 % (ref 36.0–46.0)
HEMOGLOBIN: 13.2 g/dL (ref 12.0–15.0)
LYMPHS ABS: 2 10*3/uL (ref 0.7–4.0)
LYMPHS PCT: 30.5 % (ref 12.0–46.0)
MCHC: 34 g/dL (ref 30.0–36.0)
MCV: 90.6 fl (ref 78.0–100.0)
MONO ABS: 0.6 10*3/uL (ref 0.1–1.0)
Monocytes Relative: 8.6 % (ref 3.0–12.0)
NEUTROS ABS: 3.9 10*3/uL (ref 1.4–7.7)
Neutrophils Relative %: 59.1 % (ref 43.0–77.0)
PLATELETS: 241 10*3/uL (ref 150.0–400.0)
RBC: 4.29 Mil/uL (ref 3.87–5.11)
RDW: 14.2 % (ref 11.5–15.5)
WBC: 6.6 10*3/uL (ref 4.0–10.5)

## 2015-12-25 LAB — BASIC METABOLIC PANEL
BUN: 12 mg/dL (ref 6–23)
CALCIUM: 8.7 mg/dL (ref 8.4–10.5)
CHLORIDE: 108 meq/L (ref 96–112)
CO2: 27 meq/L (ref 19–32)
CREATININE: 0.7 mg/dL (ref 0.40–1.20)
GFR: 90.12 mL/min (ref >60.00)
Glucose, Bld: 96 mg/dL (ref 70–99)
Potassium: 3.3 mEq/L — ABNORMAL LOW (ref 3.5–5.1)
Sodium: 141 mEq/L (ref 135–145)

## 2015-12-25 LAB — HEPATIC FUNCTION PANEL
ALBUMIN: 4 g/dL (ref 3.5–5.2)
ALK PHOS: 70 U/L (ref 39–117)
ALT: 33 U/L (ref 0–35)
AST: 26 U/L (ref 0–37)
BILIRUBIN DIRECT: 0.1 mg/dL (ref 0.0–0.3)
TOTAL PROTEIN: 6.3 g/dL (ref 6.0–8.3)
Total Bilirubin: 0.5 mg/dL (ref 0.2–1.2)

## 2015-12-25 LAB — LIPID PANEL
CHOL/HDL RATIO: 4
CHOLESTEROL: 133 mg/dL (ref 0–200)
HDL: 37 mg/dL — ABNORMAL LOW (ref >39.00)
LDL CALC: 80 mg/dL (ref 0–99)
NONHDL: 96.05
Triglycerides: 80 mg/dL (ref 0.0–149.0)
VLDL: 16 mg/dL (ref 0.0–40.0)

## 2015-12-25 LAB — TSH: TSH: 3 u[IU]/mL (ref 0.35–4.50)

## 2015-12-29 ENCOUNTER — Other Ambulatory Visit: Payer: Medicare Other

## 2016-01-05 ENCOUNTER — Encounter: Payer: Medicare Other | Admitting: Family Medicine

## 2016-01-05 ENCOUNTER — Other Ambulatory Visit: Payer: Self-pay

## 2016-01-05 DIAGNOSIS — M5412 Radiculopathy, cervical region: Secondary | ICD-10-CM | POA: Diagnosis not present

## 2016-01-05 DIAGNOSIS — M25512 Pain in left shoulder: Secondary | ICD-10-CM | POA: Diagnosis not present

## 2016-01-06 ENCOUNTER — Telehealth: Payer: Self-pay | Admitting: Family Medicine

## 2016-01-06 NOTE — Telephone Encounter (Signed)
Alexis call from [facility] and would like a call back concerning a rx bisoprolol (ZEBETA) 5 MG tablet   [id number] [facility]    Ref number [id number]

## 2016-01-07 DIAGNOSIS — M25512 Pain in left shoulder: Secondary | ICD-10-CM | POA: Diagnosis not present

## 2016-01-08 NOTE — Telephone Encounter (Signed)
Returned pharmacy call and made them aware that patient should only be on bisoprolol per Dr. Yong Channel last note.

## 2016-01-15 DIAGNOSIS — M24112 Other articular cartilage disorders, left shoulder: Secondary | ICD-10-CM | POA: Diagnosis not present

## 2016-01-15 DIAGNOSIS — M94212 Chondromalacia, left shoulder: Secondary | ICD-10-CM | POA: Diagnosis not present

## 2016-01-15 DIAGNOSIS — M7522 Bicipital tendinitis, left shoulder: Secondary | ICD-10-CM | POA: Diagnosis not present

## 2016-01-15 DIAGNOSIS — M7542 Impingement syndrome of left shoulder: Secondary | ICD-10-CM | POA: Diagnosis not present

## 2016-01-23 ENCOUNTER — Other Ambulatory Visit: Payer: Self-pay

## 2016-01-26 DIAGNOSIS — M24112 Other articular cartilage disorders, left shoulder: Secondary | ICD-10-CM | POA: Diagnosis not present

## 2016-02-03 ENCOUNTER — Encounter (HOSPITAL_COMMUNITY): Payer: Self-pay

## 2016-02-03 ENCOUNTER — Ambulatory Visit (HOSPITAL_COMMUNITY): Payer: Medicare Other | Attending: Orthopedic Surgery

## 2016-02-03 DIAGNOSIS — R29898 Other symptoms and signs involving the musculoskeletal system: Secondary | ICD-10-CM | POA: Insufficient documentation

## 2016-02-03 DIAGNOSIS — M25612 Stiffness of left shoulder, not elsewhere classified: Secondary | ICD-10-CM | POA: Insufficient documentation

## 2016-02-03 DIAGNOSIS — M25512 Pain in left shoulder: Secondary | ICD-10-CM | POA: Diagnosis not present

## 2016-02-03 DIAGNOSIS — Z9889 Other specified postprocedural states: Secondary | ICD-10-CM | POA: Diagnosis not present

## 2016-02-03 NOTE — Patient Instructions (Signed)
Perform each exercise 10-12 reps. 2-3x day.   Protraction - STANDING  Start by holding a wand or cane at chest height.  Next, slowly push the wand outwards in front of your body so that your elbows become fully straightened. Then, return to the original position.     Shoulder FLEXION - STANDING - PALMS UP  In the standing position, hold a wand/cane with both arms, palms up on both sides. Raise up the wand/cane allowing your unaffected arm to perform most of the effort. Your affected arm should be partially relaxed.      Internal/External ROTATION - STANDING  In the standing position, hold a wand/cane with both hands keeping your elbows bent. Move your arms and wand/cane to one side.  Your affected arm should be partially relaxed while your unaffected arm performs most of the effort.       Shoulder ABDUCTION - STANDING  While holding a wand/cane palm face up on the injured side and palm face down on the uninjured side, slowly raise up your injured arm to the side.                     Horizontal Abduction/Adduction      Straight arms holding cane at shoulder height, bring cane to right, center, left. Repeat starting to left.   Copyright  [employer]. All rights reserved.

## 2016-02-03 NOTE — Therapy (Signed)
[place], [place], [address] Phone: [phone]   Fax:  [phone]  Occupational Therapy Evaluation  Patient Details  Name: Alison Brooks MRN: [id number] Date of Birth: 1956-06-04 Referring Provider: Edmonia Lynch, MD  Encounter Date: 02/03/2016      OT End of Session - 02/03/16 1224    Visit Number 1   Number of Visits 12   Date for OT Re-Evaluation 03/16/16   Authorization Type [employer] Medicare HMO - $40.00 copay   Authorization Time Period before 10th visit   Authorization - Visit Number 1   Authorization - Number of Visits 10   OT Start Time 1120   OT Stop Time 1200   OT Time Calculation (min) 40 min   Activity Tolerance Patient tolerated treatment well   Behavior During Therapy [facility] for tasks assessed/performed      Past Medical History  Diagnosis Date  . Fibromyalgia   . Chronic back pain   . Migraine   . Depression   . GERD (gastroesophageal reflux disease)   . Squamous cell carcinoma [facility])     Past Surgical History  Procedure Laterality Date  . Lumbar fusion    . Carpal tunnel release      right  . Cholecystectomy  1995  . Abdominal hysterectomy      fibroids, including cervix, left ovaries  . Tonsillectomy  1964  . Back surgery      multiple-see overview for back pain  . Hemorrhoid surgery    . Rotator cuff repair Left 2004  . Cervical fusion  1996    There were no vitals filed for this visit.      Subjective Assessment - 02/03/16 1213    Subjective  S: I don't know what I did. It just started to hurt and didn't get better.   Pertinent History Patient is a 60 y/o female S/P left bicep tenodesis which was performed on 01/15/16. Patient arrives to clinic with sling off as she only needs to wear it for comfort. Dr. Percell Miller has referred patient to occupational therapy for evaluation and treatment.    Special Tests FOTO score: 51/100    Patient Stated Goals TO get full use of left arm  again with no pain.    Currently in Pain? Yes   Pain Score 3    Pain Location Shoulder   Pain Orientation Left   Pain Descriptors / Indicators Aching;Grimacing;Discomfort   Pain Type Acute pain   Pain Radiating Towards N/A   Pain Onset 1 to 4 weeks ago   Pain Frequency Intermittent   Aggravating Factors  Use and moving arm above shoulder level   Pain Relieving Factors Pain meds and ice   Effect of Pain on Daily Activities Patient has difficulty completing daily tasks with Left UE when pain is bad.    Multiple Pain Sites No           [facility] OT Assessment - 02/03/16 1122    Assessment   Diagnosis Left bicep tenodisis   Referring Provider Edmonia Lynch, MD   Onset Date 01/15/16  surgery   Prior Therapy Pt has not received OT for this condition this year.    Precautions   Precautions Other (comment)   Precaution Comments Patient is to wear sling for comfort. No lifting or completing any activity overhead. Complete AA/ROM exercises.   Required Braces or Orthoses Sling   Restrictions   Weight Bearing Restrictions Yes   LUE Weight  Bearing Non weight bearing   Balance Screen   Has the patient fallen in the past 6 months Yes   How many times? 1   Has the patient had a decrease in activity level because of a fear of falling?  No   Is the patient reluctant to leave their home because of a fear of falling?  No   Home  Environment   Family/patient expects to be discharged to: Private residence   Lives With [place] Retired   Biomedical scientist Retired Marine scientist   Leisure Enjoys going to car shows and watching grandkids.   ADL   ADL comments Difficulty fixing hair, putting on a shirt and pulling up paints. Doing household chores.   Mobility   Mobility Status Independent   Written Expression   Dominant Hand Right   Vision - History   Baseline Vision No visual deficits   Cognition   Overall Cognitive Status Within  Functional Limits for tasks assessed   ROM / Strength   AROM / PROM / Strength AROM;PROM;Strength   Palpation   Palpation comment Min fascial restrictions in anterior deltoid/upper arm region.   AROM   Overall AROM Comments Assessed seated. IR/er adducted   AROM Assessment Site Shoulder;Elbow;Forearm   Right/Left Shoulder Left   Left Shoulder Flexion 112 Degrees   Left Shoulder ABduction 99 Degrees   Left Shoulder Internal Rotation 90 Degrees   Left Shoulder External Rotation 50 Degrees   Right/Left Elbow Left   Left Elbow Flexion 49   Left Elbow Extension 180   Right/Left Forearm Left   Left Forearm Pronation 90 Degrees   Left Forearm Supination 90 Degrees   PROM   Overall PROM Comments Assessed supine. IR/er adducted.    PROM Assessment Site Shoulder;Elbow;Forearm   Right/Left Shoulder Left   Left Shoulder Flexion 152 Degrees   Left Shoulder ABduction 142 Degrees   Left Shoulder Internal Rotation 90 Degrees   Left Shoulder External Rotation 60 Degrees   Right/Left Elbow Left   Left Elbow Flexion 49   Left Elbow Extension 180   Right/Left Forearm Left   Left Forearm Pronation 90 Degrees   Left Forearm Supination 90 Degrees   Strength   Overall Strength Unable to assess;Due to precautions                  OT Treatments/Exercises (OP) - 02/03/16 1145    Exercises   Exercises Shoulder   Shoulder Exercises: Supine   Protraction AAROM;10 reps   Horizontal ABduction AAROM;10 reps   External Rotation AAROM;10 reps   Internal Rotation AAROM;10 reps   Flexion AAROM;10 reps   ABduction AAROM;10 reps   Shoulder Exercises: Seated   Protraction AAROM;10 reps   Horizontal ABduction AAROM;10 reps   External Rotation AAROM;10 reps   Internal Rotation AAROM;10 reps   Flexion AAROM;10 reps   Abduction AAROM;10 reps   Manual Therapy   Manual Therapy --   Manual therapy comments --               OT Education - 02/03/16 1140    Education provided Yes    Education Details AA/ROM shoulder HEP provided.   Person(s) Educated Patient   Methods Explanation;Demonstration;Handout;Verbal cues;Tactile cues   Comprehension Verbalized understanding;Returned demonstration          OT Short Term Goals - 02/03/16 1229    OT SHORT TERM GOAL #1   Title Patient will  be educated and independent with HEP to increase functional performance when using LUE.    Time 3   Period Weeks   Status New   OT SHORT TERM GOAL #2   Title Patient will increase P/ROM to WNL of LUE to increase ability to get shirts on and off with less difficulty.    Time 3   Period Weeks   Status New   OT SHORT TERM GOAL #3   Title Patient will increase LUE strength to 3+/5 to increase ability to complete activities at shoulder level.    Time 3   Period Weeks   Status New   OT SHORT TERM GOAL #4   Title Patient will decrease pain to 3/10 when completing daily tasks with LUE.   Time 3   Period Weeks   Status New           OT Long Term Goals - 02/03/16 1231    OT LONG TERM GOAL #1   Title Patient will return to highest level of independence with all daily tasks using LUE.    Time 6   Period Weeks   Status New   OT LONG TERM GOAL #2   Title Patient will increase A/ROM of LUE to WNL to increase ability to fix hair with less difficulty.    Time 6   Period Weeks   Status New   OT LONG TERM GOAL #3   Title Patient will decrease pain level to 2/10 or less when using LUE during daily tasks.    Time 6   Period Weeks   Status New   OT LONG TERM GOAL #4   Title Patient will increase LUE strength to 4+/5 to return to normal household chores with less difficulty.    Time 6   Period Weeks   Status New   OT LONG TERM GOAL #5   Title Patient will decrease fascial restrictions to trace amount or less in order to increase functional mobility of LUE.   Time 6   Period Weeks   Status New               Plan - 02/03/16 1226    Clinical Impression Statement A: patient  is a 60 y/o female S/P left bicep tenodesis causing increased pain and fascial restrictions and decreased pain and ROM resulting in difficulty completing all daily tasks using LUE.    Rehab Potential Excellent   OT Frequency 2x / week   OT Duration 6 weeks   OT Treatment/Interventions Self-care/ADL training;Ultrasound;Passive range of motion;Patient/family education;Cryotherapy;Electrical Stimulation;Moist Heat;Therapeutic activities;Therapeutic exercises;Manual Therapy   Plan P: Pt requires skilled OT services to increase functional performance during daily tasks using LUE. Treatment Plan: myofascial release, manual stretching, P/ROM, AA/ROM, progressing to A/ROM and general strengthening.    Consulted and Agree with Plan of Care Patient      Patient will benefit from skilled therapeutic intervention in order to improve the following deficits and impairments:  Decreased strength, Pain, Impaired UE functional use, Increased fascial restricitons, Decreased range of motion  Visit Diagnosis: Other symptoms and signs involving the musculoskeletal system - Plan: Ot plan of care cert/re-cert  Pain in left shoulder - Plan: Ot plan of care cert/re-cert  Stiffness of left shoulder, not elsewhere classified - Plan: Ot plan of care cert/re-cert      G-Codes - [id number]    Functional Assessment Tool Used FOTO score: 51/100 (49% impaired)   Functional Limitation Carrying, moving and handling objects  Carrying, Moving and Handling Objects Current Status [id number]) At least 40 percent but less than 60 percent impaired, limited or restricted   Carrying, Moving and Handling Objects Goal Status [id number]) At least 1 percent but less than 20 percent impaired, limited or restricted      Problem List Patient Active Problem List   Diagnosis Date Noted  . Dyspnea 11/02/2014  . Upper airway cough syndrome 11/02/2014  . Gout 06/28/2014  . Vitamin B12 deficiency 06/28/2014  . Multiple pulmonary nodules  06/28/2014  . Essential hypertension 05/17/2014  . Obesity (BMI 30-39.9) 01/14/2014  . Lateral epicondylitis (tennis elbow) 01/02/2014  . GERD (gastroesophageal reflux disease) 03/26/2013  . LEG CRAMPS 05/16/2009  . RESTLESS LEG SYNDROME, SEVERE 03/14/2009  . Chronic interstitial cystitis 01/20/2009  . DEPRESSION 03/19/2008  . TUBULOVILLOUS ADENOMA, COLON 02/17/2008  . Hyperlipidemia 02/17/2008  . NEPHROLITHIASIS 02/17/2008  . Former smoker 12/21/2007  . MIGRAINE, CLASSICAL W/O INTRACTABLE MIGRAINE 06/09/2007  . SYMPTOM, MEMORY LOSS 06/09/2007  . 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 FIBROMYALGIA 04/21/2007    Ailene Ravel, OTR/L,CBIS  [phone]  02/03/2016, 2:17 PM  [facility] [address] [place], [place], [address] Phone: [phone]   Fax:  [phone]  Name: LEOKADIA DORRIES MRN: [id number] Date of Birth: 09-Aug-1956

## 2016-02-06 ENCOUNTER — Encounter (HOSPITAL_COMMUNITY): Payer: Self-pay | Admitting: Occupational Therapy

## 2016-02-06 ENCOUNTER — Ambulatory Visit (HOSPITAL_COMMUNITY): Payer: Medicare Other | Admitting: Occupational Therapy

## 2016-02-06 DIAGNOSIS — R29898 Other symptoms and signs involving the musculoskeletal system: Secondary | ICD-10-CM

## 2016-02-06 DIAGNOSIS — Z9889 Other specified postprocedural states: Secondary | ICD-10-CM | POA: Diagnosis not present

## 2016-02-06 DIAGNOSIS — M25512 Pain in left shoulder: Secondary | ICD-10-CM

## 2016-02-06 DIAGNOSIS — M25612 Stiffness of left shoulder, not elsewhere classified: Secondary | ICD-10-CM

## 2016-02-06 NOTE — Therapy (Signed)
[name], [place], [address] Phone: [phone]   Fax:  [phone]  Occupational Therapy Treatment  Patient Details  Name: Alison Brooks MRN: [id number] Date of Birth: 1956-03-22 Referring Provider: Edmonia Lynch, MD  Encounter Date: 02/06/2016      OT End of Session - 02/06/16 1202    Visit Number 2   Number of Visits 12   Date for OT Re-Evaluation 03/16/16   Authorization Type [employer] Medicare HMO - $40.00 copay   Authorization Time Period before 10th visit   Authorization - Visit Number 2   Authorization - Number of Visits 10   OT Start Time 1115   OT Stop Time 1156   OT Time Calculation (min) 41 min   Activity Tolerance Patient tolerated treatment well   Behavior During Therapy [facility] for tasks assessed/performed      Past Medical History  Diagnosis Date  . Fibromyalgia   . Chronic back pain   . Migraine   . Depression   . GERD (gastroesophageal reflux disease)   . Squamous cell carcinoma [facility])     Past Surgical History  Procedure Laterality Date  . Lumbar fusion    . Carpal tunnel release      right  . Cholecystectomy  1995  . Abdominal hysterectomy      fibroids, including cervix, left ovaries  . Tonsillectomy  1964  . Back surgery      multiple-see overview for back pain  . Hemorrhoid surgery    . Rotator cuff repair Left 2004  . Cervical fusion  1996    There were no vitals filed for this visit.      Subjective Assessment - 02/06/16 1115    Subjective  S: I tried the exercises from last time and they are going well.    Currently in Pain? No/denies            [facility] OT Assessment - 02/06/16 1114    Assessment   Diagnosis Left bicep tenodesis   Precautions   Precautions Other (comment)   Precaution Comments Patient is to wear sling for comfort. No lifting or completing any activity overhead. Complete AA/ROM exercises.   Required Braces or Orthoses Sling   Restrictions   Weight Bearing Restrictions Yes   LUE Weight Bearing Non weight bearing                  OT Treatments/Exercises (OP) - 02/06/16 1117    Exercises   Exercises Shoulder   Shoulder Exercises: Supine   Protraction AAROM;10 reps   Horizontal ABduction AAROM;10 reps   External Rotation AAROM;10 reps   Internal Rotation AAROM;10 reps   Flexion AAROM;10 reps   ABduction AAROM;10 reps   Shoulder Exercises: Seated   Extension AROM;10 reps   Row AROM;10 reps   Protraction AAROM;10 reps   Horizontal ABduction AAROM;10 reps   External Rotation AAROM;10 reps   Internal Rotation AAROM;10 reps   Flexion AAROM;10 reps   Abduction AAROM;10 reps   Shoulder Exercises: Pulleys   Flexion 1 minute   ABduction 1 minute   Shoulder Exercises: ROM/Strengthening   Thumb Tacks 1'   Proximal Shoulder Strengthening, Supine 10X each no rest breaks   Proximal Shoulder Strengthening, Seated 10X each no rest breaks   Manual Therapy   Manual Therapy Myofascial release   Manual therapy comments manual therapy completed before therapeutic exercise this date   Myofascial Release Myofascial release to left upper arm, deltoid,  and trapezius regions to decrease pain and fascial restrictions and increase joint range of motion.                  OT Short Term Goals - 02/06/16 1205    OT SHORT TERM GOAL #1   Title Patient will be educated and independent with HEP to increase functional performance when using LUE.    Time 3   Period Weeks   Status On-going   OT SHORT TERM GOAL #2   Title Patient will increase P/ROM to WNL of LUE to increase ability to get shirts on and off with less difficulty.    Time 3   Period Weeks   Status On-going   OT SHORT TERM GOAL #3   Title Patient will increase LUE strength to 3+/5 to increase ability to complete activities at shoulder level.    Time 3   Period Weeks   Status On-going   OT SHORT TERM GOAL #4   Title Patient will decrease pain to 3/10 when  completing daily tasks with LUE.   Time 3   Period Weeks   Status On-going           OT Long Term Goals - 02/06/16 1205    OT LONG TERM GOAL #1   Title Patient will return to highest level of independence with all daily tasks using LUE.    Time 6   Period Weeks   Status On-going   OT LONG TERM GOAL #2   Title Patient will increase A/ROM of LUE to WNL to increase ability to fix hair with less difficulty.    Time 6   Period Weeks   Status On-going   OT LONG TERM GOAL #3   Title Patient will decrease pain level to 2/10 or less when using LUE during daily tasks.    Time 6   Period Weeks   Status On-going   OT LONG TERM GOAL #4   Title Patient will increase LUE strength to 4+/5 to return to normal household chores with less difficulty.    Time 6   Period Weeks   Status On-going   OT LONG TERM GOAL #5   Title Patient will decrease fascial restrictions to trace amount or less in order to increase functional mobility of LUE.   Time 6   Period Weeks   Status On-going               Plan - 02/06/16 1202    Clinical Impression Statement A: Initiated myofascial release, manual therapy, P/ROM, AA/ROM, pulley exercises this session. Pt reports intermittent pain during daily tasks, uses ice on occasion for pain management. Pt required verbal cuing and visual demonstration for form and technique.    Rehab Potential Excellent   OT Frequency 2x / week   OT Duration 6 weeks   OT Treatment/Interventions Self-care/ADL training;Ultrasound;Passive range of motion;Patient/family education;Cryotherapy;Electrical Stimulation;Moist Heat;Therapeutic activities;Therapeutic exercises;Manual Therapy   Plan P: Increase AA/ROM repetitions to 15, add rhythmic shoulder stabilization exercises   Consulted and Agree with Plan of Care Patient      Patient will benefit from skilled therapeutic intervention in order to improve the following deficits and impairments:  Decreased strength, Pain,  Impaired UE functional use, Increased fascial restricitons, Decreased range of motion  Visit Diagnosis: Other symptoms and signs involving the musculoskeletal system  Pain in left shoulder  Stiffness of left shoulder, not elsewhere classified    Problem List Patient Active Problem List   Diagnosis Date  Noted  . Dyspnea 11/02/2014  . Upper airway cough syndrome 11/02/2014  . Gout 06/28/2014  . Vitamin B12 deficiency 06/28/2014  . Multiple pulmonary nodules 06/28/2014  . Essential hypertension 05/17/2014  . Obesity (BMI 30-39.9) 01/14/2014  . Lateral epicondylitis (tennis elbow) 01/02/2014  . GERD (gastroesophageal reflux disease) 03/26/2013  . LEG CRAMPS 05/16/2009  . RESTLESS LEG SYNDROME, SEVERE 03/14/2009  . Chronic interstitial cystitis 01/20/2009  . DEPRESSION 03/19/2008  . TUBULOVILLOUS ADENOMA, COLON 02/17/2008  . Hyperlipidemia 02/17/2008  . NEPHROLITHIASIS 02/17/2008  . Former smoker 12/21/2007  . MIGRAINE, CLASSICAL W/O INTRACTABLE MIGRAINE 06/09/2007  . SYMPTOM, MEMORY LOSS 06/09/2007  . 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 FIBROMYALGIA 04/21/2007    Guadelupe Sabin, OTR/L  [phone]  02/06/2016, 12:06 PM  [facility] [address] [place], [place], [address] Phone: [phone]   Fax:  [phone]  Name: ELIZABETHANNE BUDHU MRN: [id number] Date of Birth: [birth year]

## 2016-02-10 ENCOUNTER — Ambulatory Visit (HOSPITAL_COMMUNITY): Payer: Medicare Other | Admitting: Occupational Therapy

## 2016-02-10 ENCOUNTER — Encounter (HOSPITAL_COMMUNITY): Payer: Self-pay | Admitting: Occupational Therapy

## 2016-02-10 DIAGNOSIS — M25512 Pain in left shoulder: Secondary | ICD-10-CM | POA: Diagnosis not present

## 2016-02-10 DIAGNOSIS — R29898 Other symptoms and signs involving the musculoskeletal system: Secondary | ICD-10-CM

## 2016-02-10 DIAGNOSIS — Z9889 Other specified postprocedural states: Secondary | ICD-10-CM | POA: Diagnosis not present

## 2016-02-10 DIAGNOSIS — M25612 Stiffness of left shoulder, not elsewhere classified: Secondary | ICD-10-CM | POA: Diagnosis not present

## 2016-02-10 NOTE — Therapy (Signed)
[place], [place], [address] Phone: [phone]   Fax:  [phone]  Occupational Therapy Treatment  Patient Details  Name: Alison Brooks MRN: [id number] Date of Birth: Sep 26, 1956 Referring Provider: Edmonia Lynch, MD  Encounter Date: 02/10/2016      OT End of Session - 02/10/16 1343    Visit Number 3   Number of Visits 12   Date for OT Re-Evaluation 03/16/16   Authorization Type [employer] Medicare HMO - $40.00 copay   Authorization Time Period before 10th visit   Authorization - Visit Number 3   Authorization - Number of Visits 10   OT Start Time 1301   OT Stop Time 1341   OT Time Calculation (min) 40 min   Activity Tolerance Patient tolerated treatment well   Behavior During Therapy [facility] for tasks assessed/performed      Past Medical History  Diagnosis Date  . Fibromyalgia   . Chronic back pain   . Migraine   . Depression   . GERD (gastroesophageal reflux disease)   . Squamous cell carcinoma [facility])     Past Surgical History  Procedure Laterality Date  . Lumbar fusion    . Carpal tunnel release      right  . Cholecystectomy  1995  . Abdominal hysterectomy      fibroids, including cervix, left ovaries  . Tonsillectomy  1964  . Back surgery      multiple-see overview for back pain  . Hemorrhoid surgery    . Rotator cuff repair Left 2004  . Cervical fusion  1996    There were no vitals filed for this visit.      Subjective Assessment - 02/10/16 1258    Subjective  S: This weekend wasy pretty good, not much pain in the arm at all.    Currently in Pain? No/denies            [facility] OT Assessment - 02/10/16 1258    Assessment   Diagnosis Left bicep tenodesis   Precautions   Precautions Other (comment)   Precaution Comments Patient is to wear sling for comfort. No lifting or completing any activity overhead. Complete AA/ROM exercises.   Required Braces or Orthoses Sling   Restrictions    Weight Bearing Restrictions Yes   LUE Weight Bearing Non weight bearing                  OT Treatments/Exercises (OP) - 02/10/16 1322    Exercises   Exercises Shoulder   Shoulder Exercises: Supine   Protraction AROM;12 reps   Horizontal ABduction AROM;12 reps   External Rotation AROM;12 reps   Internal Rotation AROM;12 reps   Flexion AROM;12 reps   ABduction AROM;12 reps   Shoulder Exercises: Seated   Protraction AROM;12 reps   Horizontal ABduction AROM;12 reps   External Rotation AROM;12 reps   Internal Rotation AROM;12 reps   Flexion AROM;12 reps   Abduction AROM;12 reps   Shoulder Exercises: Standing   Extension Theraband;12 reps   Theraband Level (Shoulder Extension) Level 2 (Red)   Row Theraband;12 reps   Theraband Level (Shoulder Row) Level 2 (Red)   Retraction Theraband;12 reps   Theraband Level (Shoulder Retraction) Level 2 (Red)   Shoulder Exercises: ROM/Strengthening   Over Head Lace 1'   Proximal Shoulder Strengthening, Supine 10X each no rest breaks   Proximal Shoulder Strengthening, Seated 10X each no rest breaks   Rhythmic Stabilization, Supine 90 degrees, 120 degrees,  25X, min difficulty   Manual Therapy   Manual Therapy Myofascial release   Manual therapy comments manual therapy completed before therapeutic exercise this date   Myofascial Release Myofascial release to left upper arm, deltoid, and trapezius regions to decrease pain and fascial restrictions and increase joint range of motion.                OT Education - 02/10/16 1334    Education provided Yes   Education Details A/ROM exercises   Person(s) Educated Patient   Methods Explanation;Demonstration;Handout   Comprehension Verbalized understanding;Returned demonstration          OT Short Term Goals - 02/06/16 1205    OT SHORT TERM GOAL #1   Title Patient will be educated and independent with HEP to increase functional performance when using LUE.    Time 3   Period  Weeks   Status On-going   OT SHORT TERM GOAL #2   Title Patient will increase P/ROM to WNL of LUE to increase ability to get shirts on and off with less difficulty.    Time 3   Period Weeks   Status On-going   OT SHORT TERM GOAL #3   Title Patient will increase LUE strength to 3+/5 to increase ability to complete activities at shoulder level.    Time 3   Period Weeks   Status On-going   OT SHORT TERM GOAL #4   Title Patient will decrease pain to 3/10 when completing daily tasks with LUE.   Time 3   Period Weeks   Status On-going           OT Long Term Goals - 02/06/16 1205    OT LONG TERM GOAL #1   Title Patient will return to highest level of independence with all daily tasks using LUE.    Time 6   Period Weeks   Status On-going   OT LONG TERM GOAL #2   Title Patient will increase A/ROM of LUE to WNL to increase ability to fix hair with less difficulty.    Time 6   Period Weeks   Status On-going   OT LONG TERM GOAL #3   Title Patient will decrease pain level to 2/10 or less when using LUE during daily tasks.    Time 6   Period Weeks   Status On-going   OT LONG TERM GOAL #4   Title Patient will increase LUE strength to 4+/5 to return to normal household chores with less difficulty.    Time 6   Period Weeks   Status On-going   OT LONG TERM GOAL #5   Title Patient will decrease fascial restrictions to trace amount or less in order to increase functional mobility of LUE.   Time 6   Period Weeks   Status On-going               Plan - 02/10/16 1343    Clinical Impression Statement A: Added rhythmic stabilization in supine, overhead lacing, A/ROM supine & seated, scapular theraband this session. Pt demonstrates A/ROM [facility], verbal cuing for speed and technique during exercises. Pt reports min fatigue over weekend. Updated HEP for A/ROM exercises.    Rehab Potential Excellent   OT Frequency 2x / week   OT Duration 6 weeks   OT Treatment/Interventions  Self-care/ADL training;Ultrasound;Passive range of motion;Patient/family education;Cryotherapy;Electrical Stimulation;Moist Heat;Therapeutic activities;Therapeutic exercises;Manual Therapy   Plan P: Continue to work on correct form during A/ROM, add rhythmic stabilization in sitting. Follow up  on A/ROM HEP.    OT Home Exercise Plan A/ROM HEP   Consulted and Agree with Plan of Care Patient      Patient will benefit from skilled therapeutic intervention in order to improve the following deficits and impairments:  Decreased strength, Pain, Impaired UE functional use, Increased fascial restricitons, Decreased range of motion  Visit Diagnosis: Other symptoms and signs involving the musculoskeletal system  Pain in left shoulder  Stiffness of left shoulder, not elsewhere classified    Problem List Patient Active Problem List   Diagnosis Date Noted  . Dyspnea 11/02/2014  . Upper airway cough syndrome 11/02/2014  . Gout 06/28/2014  . Vitamin B12 deficiency 06/28/2014  . Multiple pulmonary nodules 06/28/2014  . Essential hypertension 05/17/2014  . Obesity (BMI 30-39.9) 01/14/2014  . Lateral epicondylitis (tennis elbow) 01/02/2014  . GERD (gastroesophageal reflux disease) 03/26/2013  . LEG CRAMPS 05/16/2009  . RESTLESS LEG SYNDROME, SEVERE 03/14/2009  . Chronic interstitial cystitis 01/20/2009  . DEPRESSION 03/19/2008  . TUBULOVILLOUS ADENOMA, COLON 02/17/2008  . Hyperlipidemia 02/17/2008  . NEPHROLITHIASIS 02/17/2008  . Former smoker 12/21/2007  . MIGRAINE, CLASSICAL W/O INTRACTABLE MIGRAINE 06/09/2007  . SYMPTOM, MEMORY LOSS 06/09/2007  . 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 FIBROMYALGIA 04/21/2007    Guadelupe Sabin, OTR/L  [phone]  02/10/2016, 2:09 PM  [facility] [address] [place], [place], [address] Phone: [phone]   Fax:  [phone]  Name: Alison Brooks MRN:  [id number] Date of Birth: Dec 12, 1955

## 2016-02-10 NOTE — Patient Instructions (Signed)
1) Shoulder Protraction    Begin with elbows by your side, slowly "punch" straight out in front of you keeping arms/elbows straight.      2) Shoulder Flexion  Supine:     Standing:         Begin with arms at your side with thumbs pointed up, slowly raise both arms up and forward towards overhead.         3) Horizontal abduction/adduction  Supine:   Standing:           Begin with arms straight out in front of you, bring out to the side in at "T" shape. Keep arms straight entire time.         4) Internal & External Rotation    *No band* -Stand with elbows at the side and elbows bent 90 degrees. Move your forearms away from your body, then bring back inward toward the body.     5) Shoulder Abduction  Supine:     Standing:       Lying on your back begin with your arms flat on the table next to your side. Slowly move your arms out to the side so that they go overhead, in a jumping jack or snow angel movement.      Repeat all exercises 10-15 times, 1-2 times per day.  

## 2016-02-13 ENCOUNTER — Ambulatory Visit (HOSPITAL_COMMUNITY): Payer: Medicare Other

## 2016-02-13 ENCOUNTER — Encounter (HOSPITAL_COMMUNITY): Payer: Self-pay

## 2016-02-13 DIAGNOSIS — R29898 Other symptoms and signs involving the musculoskeletal system: Secondary | ICD-10-CM

## 2016-02-13 DIAGNOSIS — M25612 Stiffness of left shoulder, not elsewhere classified: Secondary | ICD-10-CM

## 2016-02-13 DIAGNOSIS — M25512 Pain in left shoulder: Secondary | ICD-10-CM

## 2016-02-13 DIAGNOSIS — Z9889 Other specified postprocedural states: Secondary | ICD-10-CM | POA: Diagnosis not present

## 2016-02-13 NOTE — Therapy (Signed)
[place], [place], [address] Phone: [phone]   Fax:  [phone]  Occupational Therapy Treatment  Patient Details  Name: Alison Brooks MRN: [id number] Date of Birth: May 15, 1956 Referring Provider: Edmonia Lynch, MD  Encounter Date: 02/13/2016      OT End of Session - 02/13/16 1138    Visit Number 4   Number of Visits 12   Date for OT Re-Evaluation 03/16/16   Authorization Type [employer] Medicare HMO - $40.00 copay   Authorization Time Period before 10th visit   Authorization - Visit Number 4   Authorization - Number of Visits 10   OT Start Time 1100   OT Stop Time 1140   OT Time Calculation (min) 40 min   Activity Tolerance Patient tolerated treatment well   Behavior During Therapy [facility] for tasks assessed/performed      Past Medical History  Diagnosis Date  . Fibromyalgia   . Chronic back pain   . Migraine   . Depression   . GERD (gastroesophageal reflux disease)   . Squamous cell carcinoma [facility])     Past Surgical History  Procedure Laterality Date  . Lumbar fusion    . Carpal tunnel release      right  . Cholecystectomy  1995  . Abdominal hysterectomy      fibroids, including cervix, left ovaries  . Tonsillectomy  1964  . Back surgery      multiple-see overview for back pain  . Hemorrhoid surgery    . Rotator cuff repair Left 2004  . Cervical fusion  1996    There were no vitals filed for this visit.      Subjective Assessment - 02/13/16 1106    Subjective  S: Some days it just hurts a little more.   Currently in Pain? Yes   Pain Score 4    Pain Location Shoulder   Pain Orientation Left   Pain Descriptors / Indicators Aching   Pain Type Acute pain   Pain Radiating Towards N/A   Pain Onset 1 to 4 weeks ago   Pain Frequency Intermittent   Aggravating Factors  Use   Pain Relieving Factors Pain meds and ice   Effect of Pain on Daily Activities Difficulty completing daily tasks  with LUE.            [facility] OT Assessment - 02/13/16 1108    Assessment   Diagnosis Left bicep tenodesis   Precautions   Precautions Other (comment)   Precaution Comments No lifting or completing any activity overhead. Complete AA/ROM exercises.                  OT Treatments/Exercises (OP) - 02/13/16 1108    Exercises   Exercises Shoulder   Shoulder Exercises: Supine   Protraction PROM;5 reps;AROM;12 reps   Horizontal ABduction PROM;5 reps;AROM;12 reps   External Rotation PROM;5 reps;AROM;12 reps   Internal Rotation PROM;5 reps;AROM;12 reps   Flexion PROM;5 reps;AROM;12 reps   ABduction PROM;5 reps;AROM;12 reps   Shoulder Exercises: Seated   Protraction AROM;12 reps   Horizontal ABduction AROM;12 reps   External Rotation AROM;12 reps   Internal Rotation AROM;12 reps   Flexion AROM;12 reps   Abduction AROM;12 reps   Shoulder Exercises: Standing   Extension Theraband;12 reps   Theraband Level (Shoulder Extension) Level 2 (Red)   Row Theraband;12 reps   Theraband Level (Shoulder Row) Level 2 (Red)   Retraction Theraband;12 reps   Theraband Level (  Shoulder Retraction) Level 2 (Red)   Shoulder Exercises: ROM/Strengthening   Rebounder Level 1, 2 minutes forwards 2 minutes reverse   Over Head Lace 1'   Proximal Shoulder Strengthening, Supine 10X each no rest breaks   Proximal Shoulder Strengthening, Seated 10X each no rest breaks   Rhythmic Stabilization, Supine flexion/extension, internal/external rotation 30 seconds each   Manual Therapy   Manual Therapy Myofascial release   Manual therapy comments manual therapy completed before therapeutic exercise this date   Myofascial Release Myofascial release to left upper arm, deltoid, and trapezius regions to decrease pain and fascial restrictions and increase joint range of motion.                  OT Short Term Goals - 02/06/16 1205    OT SHORT TERM GOAL #1   Title Patient will be educated and  independent with HEP to increase functional performance when using LUE.    Time 3   Period Weeks   Status On-going   OT SHORT TERM GOAL #2   Title Patient will increase P/ROM to WNL of LUE to increase ability to get shirts on and off with less difficulty.    Time 3   Period Weeks   Status On-going   OT SHORT TERM GOAL #3   Title Patient will increase LUE strength to 3+/5 to increase ability to complete activities at shoulder level.    Time 3   Period Weeks   Status On-going   OT SHORT TERM GOAL #4   Title Patient will decrease pain to 3/10 when completing daily tasks with LUE.   Time 3   Period Weeks   Status On-going           OT Long Term Goals - 02/06/16 1205    OT LONG TERM GOAL #1   Title Patient will return to highest level of independence with all daily tasks using LUE.    Time 6   Period Weeks   Status On-going   OT LONG TERM GOAL #2   Title Patient will increase A/ROM of LUE to WNL to increase ability to fix hair with less difficulty.    Time 6   Period Weeks   Status On-going   OT LONG TERM GOAL #3   Title Patient will decrease pain level to 2/10 or less when using LUE during daily tasks.    Time 6   Period Weeks   Status On-going   OT LONG TERM GOAL #4   Title Patient will increase LUE strength to 4+/5 to return to normal household chores with less difficulty.    Time 6   Period Weeks   Status On-going   OT LONG TERM GOAL #5   Title Patient will decrease fascial restrictions to trace amount or less in order to increase functional mobility of LUE.   Time 6   Period Weeks   Status On-going               Plan - 02/13/16 1139    Clinical Impression Statement A: Continued with A/ROM exercises this session, therapist provided visual and verbal cues for form. Added arm bike.   Plan P: Add W arms and X to V arms. Provide patient with Theraband HEP.      Patient will benefit from skilled therapeutic intervention in order to improve the following  deficits and impairments:     Visit Diagnosis: Other symptoms and signs involving the musculoskeletal system  Pain in left  shoulder  Stiffness of left shoulder, not elsewhere classified    Problem List Patient Active Problem List   Diagnosis Date Noted  . Dyspnea 11/02/2014  . Upper airway cough syndrome 11/02/2014  . Gout 06/28/2014  . Vitamin B12 deficiency 06/28/2014  . Multiple pulmonary nodules 06/28/2014  . Essential hypertension 05/17/2014  . Obesity (BMI 30-39.9) 01/14/2014  . Lateral epicondylitis (tennis elbow) 01/02/2014  . GERD (gastroesophageal reflux disease) 03/26/2013  . LEG CRAMPS 05/16/2009  . RESTLESS LEG SYNDROME, SEVERE 03/14/2009  . Chronic interstitial cystitis 01/20/2009  . DEPRESSION 03/19/2008  . TUBULOVILLOUS ADENOMA, COLON 02/17/2008  . Hyperlipidemia 02/17/2008  . NEPHROLITHIASIS 02/17/2008  . Former smoker 12/21/2007  . MIGRAINE, CLASSICAL W/O INTRACTABLE MIGRAINE 06/09/2007  . SYMPTOM, MEMORY LOSS 06/09/2007  . 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 FIBROMYALGIA 04/21/2007    Marijo Conception OTA student 02/13/2016, 11:40 AM  [facility] [address] [place], [place], [address] Phone: [phone]   Fax:  [phone]  Name: Alison Brooks MRN: [id number] Date of Birth: [birth year]  Ailene Ravel, OTR/L,CBIS  [phone]  This entire session was guided, instructed, and directly supervised by Ailene Ravel, OTR/L, CBIS.

## 2016-02-18 ENCOUNTER — Ambulatory Visit (HOSPITAL_COMMUNITY): Payer: Medicare Other | Admitting: Occupational Therapy

## 2016-02-18 ENCOUNTER — Encounter (HOSPITAL_COMMUNITY): Payer: Self-pay | Admitting: Occupational Therapy

## 2016-02-18 DIAGNOSIS — R29898 Other symptoms and signs involving the musculoskeletal system: Secondary | ICD-10-CM

## 2016-02-18 DIAGNOSIS — M25612 Stiffness of left shoulder, not elsewhere classified: Secondary | ICD-10-CM

## 2016-02-18 DIAGNOSIS — M25512 Pain in left shoulder: Secondary | ICD-10-CM

## 2016-02-18 DIAGNOSIS — Z9889 Other specified postprocedural states: Secondary | ICD-10-CM | POA: Diagnosis not present

## 2016-02-18 NOTE — Therapy (Signed)
[place], [place], [address] Phone: [phone]   Fax:  [phone]  Occupational Therapy Treatment  Patient Details  Name: Alison Brooks MRN: [id number] Date of Birth: [birth year] Referring Provider: Edmonia Lynch, MD  Encounter Date: 02/18/2016      OT End of Session - 02/18/16 1158    Visit Number 5   Number of Visits 12   Date for OT Re-Evaluation 03/16/16   Authorization Type [employer] Medicare HMO - $40.00 copay   Authorization Time Period before 10th visit   Authorization - Visit Number 5   Authorization - Number of Visits 10   OT Start Time 1115   OT Stop Time 1157   OT Time Calculation (min) 42 min   Activity Tolerance Patient tolerated treatment well   Behavior During Therapy [facility] for tasks assessed/performed      Past Medical History  Diagnosis Date  . Fibromyalgia   . Chronic back pain   . Migraine   . Depression   . GERD (gastroesophageal reflux disease)   . Squamous cell carcinoma [facility])     Past Surgical History  Procedure Laterality Date  . Lumbar fusion    . Carpal tunnel release      right  . Cholecystectomy  1995  . Abdominal hysterectomy      fibroids, including cervix, left ovaries  . Tonsillectomy  1964  . Back surgery      multiple-see overview for back pain  . Hemorrhoid surgery    . Rotator cuff repair Left 2004  . Cervical fusion  1996    There were no vitals filed for this visit.      Subjective Assessment - 02/18/16 1111    Subjective  S: My neck is hurting a little today.    Currently in Pain? Yes   Pain Score 3    Pain Location Shoulder   Pain Orientation Left   Pain Descriptors / Indicators Aching   Pain Type Acute pain   Pain Radiating Towards down the arm to elbow   Pain Onset 1 to 4 weeks ago   Pain Frequency Intermittent   Aggravating Factors  use, movement   Pain Relieving Factors rest, pain meds   Effect of Pain on Daily Activities difficulty  completing daily tasks with LUE   Multiple Pain Sites No            [facility] OT Assessment - 02/18/16 1110    Assessment   Diagnosis Left bicep tenodesis   Precautions   Precautions Other (comment)   Precaution Comments No lifting or completing any activity overhead.                   OT Treatments/Exercises (OP) - 02/18/16 1117    Exercises   Exercises Shoulder   Shoulder Exercises: Supine   Protraction PROM;5 reps;AROM;12 reps   Horizontal ABduction PROM;5 reps;AROM;12 reps   External Rotation PROM;5 reps;AROM;12 reps   Internal Rotation PROM;5 reps;AROM;12 reps   Flexion PROM;5 reps;AROM;12 reps   ABduction PROM;5 reps;AROM;12 reps   Shoulder Exercises: Seated   Protraction AROM;12 reps   Horizontal ABduction AROM;12 reps   External Rotation AROM;12 reps   Internal Rotation AROM;12 reps   Flexion AROM;12 reps   Abduction AROM;12 reps   Shoulder Exercises: Standing   External Rotation Theraband;12 reps   Theraband Level (Shoulder External Rotation) Level 2 (Red)   Internal Rotation Theraband;12 reps   Theraband Level (Shoulder Internal Rotation)  Level 2 (Red)   Extension Theraband;12 reps   Theraband Level (Shoulder Extension) Level 2 (Red)   Row Theraband;12 reps   Theraband Level (Shoulder Row) Level 2 (Red)   Retraction Theraband;12 reps   Theraband Level (Shoulder Retraction) Level 2 (Red)   Shoulder Exercises: ROM/Strengthening   Rebounder Level 1, 3 minutes forwards 3 minutes reverse   "W" Arms 10X   X to V Arms 10X   Proximal Shoulder Strengthening, Supine 12X each no rest breaks   Proximal Shoulder Strengthening, Seated 12X each no rest breaks   Rhythmic Stabilization, Supine flexion/extension, internal/external rotation 30 seconds each   Manual Therapy   Manual Therapy Myofascial release   Manual therapy comments manual therapy completed before therapeutic exercise this date   Myofascial Release Myofascial release to left upper arm, deltoid,  and trapezius regions to decrease pain and fascial restrictions and increase joint range of motion.                OT Education - 02/18/16 1139    Education provided Yes   Education Details red scapular theraband HEP   Person(s) Educated Patient   Methods Explanation;Demonstration;Handout   Comprehension Verbalized understanding;Returned demonstration          OT Short Term Goals - 02/06/16 1205    OT SHORT TERM GOAL #1   Title Patient will be educated and independent with HEP to increase functional performance when using LUE.    Time 3   Period Weeks   Status On-going   OT SHORT TERM GOAL #2   Title Patient will increase P/ROM to WNL of LUE to increase ability to get shirts on and off with less difficulty.    Time 3   Period Weeks   Status On-going   OT SHORT TERM GOAL #3   Title Patient will increase LUE strength to 3+/5 to increase ability to complete activities at shoulder level.    Time 3   Period Weeks   Status On-going   OT SHORT TERM GOAL #4   Title Patient will decrease pain to 3/10 when completing daily tasks with LUE.   Time 3   Period Weeks   Status On-going           OT Long Term Goals - 02/06/16 1205    OT LONG TERM GOAL #1   Title Patient will return to highest level of independence with all daily tasks using LUE.    Time 6   Period Weeks   Status On-going   OT LONG TERM GOAL #2   Title Patient will increase A/ROM of LUE to WNL to increase ability to fix hair with less difficulty.    Time 6   Period Weeks   Status On-going   OT LONG TERM GOAL #3   Title Patient will decrease pain level to 2/10 or less when using LUE during daily tasks.    Time 6   Period Weeks   Status On-going   OT LONG TERM GOAL #4   Title Patient will increase LUE strength to 4+/5 to return to normal household chores with less difficulty.    Time 6   Period Weeks   Status On-going   OT LONG TERM GOAL #5   Title Patient will decrease fascial restrictions to  trace amount or less in order to increase functional mobility of LUE.   Time 6   Period Weeks   Status On-going  Plan - 02/18/16 1158    Clinical Impression Statement A: Added x to v arms and w arms this session, verbal cuing for form & technique, increased arm bike to 3' forward and reverse. Added ER/IR theraband, provided scapular theraband HEP. Pt reports discomfort in neck today, limited exercises as needed.    Rehab Potential Excellent   OT Frequency 2x / week   OT Duration 6 weeks   OT Treatment/Interventions Self-care/ADL training;Ultrasound;Passive range of motion;Patient/family education;Cryotherapy;Electrical Stimulation;Moist Heat;Therapeutic activities;Therapeutic exercises;Manual Therapy   Plan P: Follow up on scapular theraband HEP, add ball on wall for increased scapular stability and strengthening. Increase A/ROM reps to 15 in supine and sitting.    OT Home Exercise Plan red scapular theraband   Consulted and Agree with Plan of Care Patient      Patient will benefit from skilled therapeutic intervention in order to improve the following deficits and impairments:  Decreased strength, Pain, Impaired UE functional use, Increased fascial restricitons, Decreased range of motion  Visit Diagnosis: Other symptoms and signs involving the musculoskeletal system  Pain in left shoulder  Stiffness of left shoulder, not elsewhere classified    Problem List Patient Active Problem List   Diagnosis Date Noted  . Dyspnea 11/02/2014  . Upper airway cough syndrome 11/02/2014  . Gout 06/28/2014  . Vitamin B12 deficiency 06/28/2014  . Multiple pulmonary nodules 06/28/2014  . Essential hypertension 05/17/2014  . Obesity (BMI 30-39.9) 01/14/2014  . Lateral epicondylitis (tennis elbow) 01/02/2014  . GERD (gastroesophageal reflux disease) 03/26/2013  . LEG CRAMPS 05/16/2009  . RESTLESS LEG SYNDROME, SEVERE 03/14/2009  . Chronic interstitial cystitis 01/20/2009   . DEPRESSION 03/19/2008  . TUBULOVILLOUS ADENOMA, COLON 02/17/2008  . Hyperlipidemia 02/17/2008  . NEPHROLITHIASIS 02/17/2008  . Former smoker 12/21/2007  . MIGRAINE, CLASSICAL W/O INTRACTABLE MIGRAINE 06/09/2007  . SYMPTOM, MEMORY LOSS 06/09/2007  . 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 FIBROMYALGIA 04/21/2007    Guadelupe Sabin, OTR/L  [phone]  02/18/2016, 12:01 PM  [facility] [address] [place], [place], [address] Phone: [phone]   Fax:  [phone]  Name: Alison Brooks MRN: [id number] Date of Birth: May 31, 1956

## 2016-02-18 NOTE — Patient Instructions (Signed)

## 2016-02-20 ENCOUNTER — Encounter (HOSPITAL_COMMUNITY): Payer: Self-pay | Admitting: Occupational Therapy

## 2016-02-20 ENCOUNTER — Ambulatory Visit (HOSPITAL_COMMUNITY): Payer: Medicare Other | Admitting: Occupational Therapy

## 2016-02-20 DIAGNOSIS — M25612 Stiffness of left shoulder, not elsewhere classified: Secondary | ICD-10-CM

## 2016-02-20 DIAGNOSIS — Z9889 Other specified postprocedural states: Secondary | ICD-10-CM | POA: Diagnosis not present

## 2016-02-20 DIAGNOSIS — M25512 Pain in left shoulder: Secondary | ICD-10-CM | POA: Diagnosis not present

## 2016-02-20 DIAGNOSIS — R29898 Other symptoms and signs involving the musculoskeletal system: Secondary | ICD-10-CM

## 2016-02-20 NOTE — Therapy (Signed)
[place], [place], [address] Phone: [phone]   Fax:  [phone]  Occupational Therapy Treatment  Patient Details  Name: Alison Brooks MRN: [id number] Date of Birth: 12/10/1955 Referring Provider: Edmonia Lynch, MD  Encounter Date: 02/20/2016      OT End of Session - 02/20/16 1204    Visit Number 6   Number of Visits 12   Date for OT Re-Evaluation 03/16/16   Authorization Type [employer] Medicare HMO - $40.00 copay   Authorization Time Period before 10th visit   Authorization - Visit Number 6   Authorization - Number of Visits 10   OT Start Time 1116   OT Stop Time 1158   OT Time Calculation (min) 42 min   Activity Tolerance Patient tolerated treatment well   Behavior During Therapy [facility] for tasks assessed/performed      Past Medical History  Diagnosis Date  . Fibromyalgia   . Chronic back pain   . Migraine   . Depression   . GERD (gastroesophageal reflux disease)   . Squamous cell carcinoma [facility])     Past Surgical History  Procedure Laterality Date  . Lumbar fusion    . Carpal tunnel release      right  . Cholecystectomy  1995  . Abdominal hysterectomy      fibroids, including cervix, left ovaries  . Tonsillectomy  1964  . Back surgery      multiple-see overview for back pain  . Hemorrhoid surgery    . Rotator cuff repair Left 2004  . Cervical fusion  1996    There were no vitals filed for this visit.      Subjective Assessment - 02/20/16 1116    Subjective  S: My neck is still hurting some today.   Currently in Pain? Yes   Pain Score 1    Pain Location Shoulder   Pain Orientation Left   Pain Descriptors / Indicators Aching   Pain Type Acute pain   Pain Radiating Towards to elbow   Pain Onset 1 to 4 weeks ago   Pain Frequency Intermittent   Aggravating Factors  use, movement   Pain Relieving Factors rest, pain meds   Effect of Pain on Daily Activities difficulty using LUE with  daily tasks.   Multiple Pain Sites No                      OT Treatments/Exercises (OP) - 02/20/16 1134    Exercises   Exercises Shoulder   Shoulder Exercises: Supine   Protraction PROM;5 reps;AROM;15 reps   Horizontal ABduction PROM;5 reps;AROM;15 reps   External Rotation PROM;5 reps;AROM;15 reps   Internal Rotation PROM;5 reps;AROM;15 reps   Flexion PROM;5 reps;AROM;15 reps   ABduction PROM;5 reps;AROM;15 reps   Shoulder Exercises: Seated   Protraction AROM;12 reps   Horizontal ABduction AROM;12 reps   External Rotation AROM;12 reps   Internal Rotation AROM;12 reps   Flexion AROM;12 reps   Abduction AROM;12 reps   Shoulder Exercises: Standing   External Rotation Theraband;12 reps   Theraband Level (Shoulder External Rotation) Level 2 (Red)   Internal Rotation Theraband;12 reps   Theraband Level (Shoulder Internal Rotation) Level 2 (Red)   Row Theraband;12 reps   Theraband Level (Shoulder Row) Level 2 (Red)   Shoulder Exercises: ROM/Strengthening   Proximal Shoulder Strengthening, Supine 12X each no rest breaks   Ball on Wall 1' flexion, 30" abduction   Manual Therapy  Manual Therapy Myofascial release   Manual therapy comments manual therapy completed before therapeutic exercise this date   Myofascial Release Myofascial release to left upper arm, deltoid, and trapezius regions to decrease pain and fascial restrictions and increase joint range of motion.                  OT Short Term Goals - 02/06/16 1205    OT SHORT TERM GOAL #1   Title Patient will be educated and independent with HEP to increase functional performance when using LUE.    Time 3   Period Weeks   Status On-going   OT SHORT TERM GOAL #2   Title Patient will increase P/ROM to WNL of LUE to increase ability to get shirts on and off with less difficulty.    Time 3   Period Weeks   Status On-going   OT SHORT TERM GOAL #3   Title Patient will increase LUE strength to 3+/5 to  increase ability to complete activities at shoulder level.    Time 3   Period Weeks   Status On-going   OT SHORT TERM GOAL #4   Title Patient will decrease pain to 3/10 when completing daily tasks with LUE.   Time 3   Period Weeks   Status On-going           OT Long Term Goals - 02/06/16 1205    OT LONG TERM GOAL #1   Title Patient will return to highest level of independence with all daily tasks using LUE.    Time 6   Period Weeks   Status On-going   OT LONG TERM GOAL #2   Title Patient will increase A/ROM of LUE to WNL to increase ability to fix hair with less difficulty.    Time 6   Period Weeks   Status On-going   OT LONG TERM GOAL #3   Title Patient will decrease pain level to 2/10 or less when using LUE during daily tasks.    Time 6   Period Weeks   Status On-going   OT LONG TERM GOAL #4   Title Patient will increase LUE strength to 4+/5 to return to normal household chores with less difficulty.    Time 6   Period Weeks   Status On-going   OT LONG TERM GOAL #5   Title Patient will decrease fascial restrictions to trace amount or less in order to increase functional mobility of LUE.   Time 6   Period Weeks   Status On-going               Plan - 02/20/16 1204    Clinical Impression Statement A: Added ball on wall this session, increased supine repetitions to 15. Did not complete all scapular theraband exercises, x to v arms, or w arms due to increased discomfort in pts neck. Verbal cuing for form, rest breaks provided as needed.    Rehab Potential Excellent   OT Frequency 2x / week   OT Duration 6 weeks   OT Treatment/Interventions Self-care/ADL training;Ultrasound;Passive range of motion;Patient/family education;Cryotherapy;Electrical Stimulation;Moist Heat;Therapeutic activities;Therapeutic exercises;Manual Therapy   Plan P: If neck pain/discomfort has resolved, resume missed exercises, increase A/ROM repetitions to 15 in sitting.    Consulted and Agree  with Plan of Care Patient      Patient will benefit from skilled therapeutic intervention in order to improve the following deficits and impairments:  Decreased strength, Pain, Impaired UE functional use, Increased fascial restricitons, Decreased range  of motion  Visit Diagnosis: Other symptoms and signs involving the musculoskeletal system  Pain in left shoulder  Stiffness of left shoulder, not elsewhere classified    Problem List Patient Active Problem List   Diagnosis Date Noted  . Dyspnea 11/02/2014  . Upper airway cough syndrome 11/02/2014  . Gout 06/28/2014  . Vitamin B12 deficiency 06/28/2014  . Multiple pulmonary nodules 06/28/2014  . Essential hypertension 05/17/2014  . Obesity (BMI 30-39.9) 01/14/2014  . Lateral epicondylitis (tennis elbow) 01/02/2014  . GERD (gastroesophageal reflux disease) 03/26/2013  . LEG CRAMPS 05/16/2009  . RESTLESS LEG SYNDROME, SEVERE 03/14/2009  . Chronic interstitial cystitis 01/20/2009  . DEPRESSION 03/19/2008  . TUBULOVILLOUS ADENOMA, COLON 02/17/2008  . Hyperlipidemia 02/17/2008  . NEPHROLITHIASIS 02/17/2008  . Former smoker 12/21/2007  . MIGRAINE, CLASSICAL W/O INTRACTABLE MIGRAINE 06/09/2007  . SYMPTOM, MEMORY LOSS 06/09/2007  . 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 FIBROMYALGIA 04/21/2007    Guadelupe Sabin, OTR/L  [phone]  02/20/2016, 12:06 PM  [place] [address] [place], [place], [address] Phone: [phone]   Fax:  [phone]  Name: Alison Brooks MRN: [id number] Date of Birth: January 04, 1956

## 2016-02-24 ENCOUNTER — Encounter (HOSPITAL_COMMUNITY): Payer: Self-pay

## 2016-02-24 ENCOUNTER — Ambulatory Visit (HOSPITAL_COMMUNITY): Payer: Medicare Other | Attending: Orthopedic Surgery

## 2016-02-24 DIAGNOSIS — R29898 Other symptoms and signs involving the musculoskeletal system: Secondary | ICD-10-CM

## 2016-02-24 DIAGNOSIS — M25612 Stiffness of left shoulder, not elsewhere classified: Secondary | ICD-10-CM | POA: Insufficient documentation

## 2016-02-24 DIAGNOSIS — M25512 Pain in left shoulder: Secondary | ICD-10-CM | POA: Insufficient documentation

## 2016-02-24 NOTE — Therapy (Signed)
[place], [place], [address] Phone: [phone]   Fax:  [phone]  Occupational Therapy Treatment  Patient Details  Name: Alison Brooks MRN: [id number] Date of Birth: April 03, 1956 Referring Provider: Edmonia Lynch, MD  Encounter Date: 02/24/2016      OT End of Session - 02/24/16 1152    Visit Number 7   Number of Visits 12   Date for OT Re-Evaluation 03/16/16   Authorization Type [employer] Medicare HMO - $40.00 copay   Authorization Time Period before 10th visit   Authorization - Visit Number 7   Authorization - Number of Visits 10   OT Start Time 1115   OT Stop Time 1150  patient requested to end session early due to neck pain   OT Time Calculation (min) 35 min   Activity Tolerance Patient tolerated treatment well   Behavior During Therapy [facility] for tasks assessed/performed      Past Medical History  Diagnosis Date  . Fibromyalgia   . Chronic back pain   . Migraine   . Depression   . GERD (gastroesophageal reflux disease)   . Squamous cell carcinoma [facility])     Past Surgical History  Procedure Laterality Date  . Lumbar fusion    . Carpal tunnel release      right  . Cholecystectomy  1995  . Abdominal hysterectomy      fibroids, including cervix, left ovaries  . Tonsillectomy  1964  . Back surgery      multiple-see overview for back pain  . Hemorrhoid surgery    . Rotator cuff repair Left 2004  . Cervical fusion  1996    There were no vitals filed for this visit.      Subjective Assessment - 02/24/16 1119    Subjective  S: I can't do those squeezes, it hurts my neck.   Currently in Pain? Yes   Pain Score 1    Pain Location Shoulder   Pain Orientation Left   Pain Descriptors / Indicators Aching;Tightness;Sore   Pain Type Acute pain   Pain Radiating Towards N/A   Pain Onset 1 to 4 weeks ago                      OT Treatments/Exercises (OP) - 02/24/16 1120    Exercises   Exercises Shoulder   Shoulder Exercises: Supine   Protraction PROM;5 reps;AROM;15 reps   Horizontal ABduction PROM;5 reps;AROM;15 reps   Flexion PROM;5 reps;AROM;15 reps   ABduction PROM;5 reps;AROM;15 reps   Shoulder Exercises: Seated   Protraction AROM;15 reps   Horizontal ABduction AROM;15 reps   External Rotation AROM;15 reps   Internal Rotation AROM;15 reps   Flexion AROM;15 reps   Abduction AROM;15 reps   Shoulder Exercises: Standing   External Rotation Theraband;12 reps   Theraband Level (Shoulder External Rotation) Level 2 (Red)   Internal Rotation Theraband;12 reps   Theraband Level (Shoulder Internal Rotation) Level 2 (Red)   Extension Theraband;12 reps   Theraband Level (Shoulder Extension) Level 2 (Red)   Row Theraband;12 reps   Theraband Level (Shoulder Row) Level 2 (Red)   Retraction Theraband;12 reps   Theraband Level (Shoulder Retraction) Level 2 (Red)   Shoulder Exercises: ROM/Strengthening   UBE (Upper Arm Bike) Level 1 1 minute forwards 1 minute reverse   Proximal Shoulder Strengthening, Supine 12X each no rest breaks   Proximal Shoulder Strengthening, Seated 12X   Ball on Wall 1' flexion  Manual Therapy   Manual Therapy Myofascial release   Manual therapy comments manual therapy completed before therapeutic exercise this date   Myofascial Release Myofascial release to left upper arm, deltoid, and trapezius regions to decrease pain and fascial restrictions and increase joint range of motion.                  OT Short Term Goals - 02/06/16 1205    OT SHORT TERM GOAL #1   Title Patient will be educated and independent with HEP to increase functional performance when using LUE.    Time 3   Period Weeks   Status On-going   OT SHORT TERM GOAL #2   Title Patient will increase P/ROM to WNL of LUE to increase ability to get shirts on and off with less difficulty.    Time 3   Period Weeks   Status On-going   OT SHORT TERM GOAL #3   Title Patient  will increase LUE strength to 3+/5 to increase ability to complete activities at shoulder level.    Time 3   Period Weeks   Status On-going   OT SHORT TERM GOAL #4   Title Patient will decrease pain to 3/10 when completing daily tasks with LUE.   Time 3   Period Weeks   Status On-going           OT Long Term Goals - 02/06/16 1205    OT LONG TERM GOAL #1   Title Patient will return to highest level of independence with all daily tasks using LUE.    Time 6   Period Weeks   Status On-going   OT LONG TERM GOAL #2   Title Patient will increase A/ROM of LUE to WNL to increase ability to fix hair with less difficulty.    Time 6   Period Weeks   Status On-going   OT LONG TERM GOAL #3   Title Patient will decrease pain level to 2/10 or less when using LUE during daily tasks.    Time 6   Period Weeks   Status On-going   OT LONG TERM GOAL #4   Title Patient will increase LUE strength to 4+/5 to return to normal household chores with less difficulty.    Time 6   Period Weeks   Status On-going   OT LONG TERM GOAL #5   Title Patient will decrease fascial restrictions to trace amount or less in order to increase functional mobility of LUE.   Time 6   Period Weeks   Status On-going               Plan - 02/24/16 1152    Clinical Impression Statement A: Patient is limited by neck pain this session and is unable to complete x to v arms, w arms, or abduction with ball on the wall. Therapist provides verbal and tactile cues for form during Theraband exercises. Patient tolerated increased reps of A/ROM exercises in seated well with no complaints of increased pain.   Plan P: Add 1lb weight in supine.      Patient will benefit from skilled therapeutic intervention in order to improve the following deficits and impairments:  Decreased strength, Pain, Impaired UE functional use, Increased fascial restricitons, Decreased range of motion  Visit Diagnosis: Other symptoms and signs  involving the musculoskeletal system  Pain in left shoulder  Stiffness of left shoulder, not elsewhere classified    Problem List Patient Active Problem List   Diagnosis Date  Noted  . Dyspnea 11/02/2014  . Upper airway cough syndrome 11/02/2014  . Gout 06/28/2014  . Vitamin B12 deficiency 06/28/2014  . Multiple pulmonary nodules 06/28/2014  . Essential hypertension 05/17/2014  . Obesity (BMI 30-39.9) 01/14/2014  . Lateral epicondylitis (tennis elbow) 01/02/2014  . GERD (gastroesophageal reflux disease) 03/26/2013  . LEG CRAMPS 05/16/2009  . RESTLESS LEG SYNDROME, SEVERE 03/14/2009  . Chronic interstitial cystitis 01/20/2009  . DEPRESSION 03/19/2008  . TUBULOVILLOUS ADENOMA, COLON 02/17/2008  . Hyperlipidemia 02/17/2008  . NEPHROLITHIASIS 02/17/2008  . Former smoker 12/21/2007  . MIGRAINE, CLASSICAL W/O INTRACTABLE MIGRAINE 06/09/2007  . SYMPTOM, MEMORY LOSS 06/09/2007  . 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 FIBROMYALGIA 04/21/2007    Marijo Conception OTA student 02/24/2016, 11:55 AM  [place], [place], [address] Phone: [phone]   Fax:  [phone]  Name: Alison Brooks MRN: [id number] Date of Birth: 1956/05/31  This entire session was guided, instructed, and directly supervised by Ailene Ravel, OTR/L, CBIS. Ailene Ravel, OTR/L,CBIS  [phone]

## 2016-02-27 ENCOUNTER — Telehealth (HOSPITAL_COMMUNITY): Payer: Self-pay

## 2016-02-27 ENCOUNTER — Ambulatory Visit (HOSPITAL_COMMUNITY): Payer: Medicare Other | Admitting: Occupational Therapy

## 2016-02-27 NOTE — Telephone Encounter (Signed)
02/27/16 said she needed to cx today

## 2016-02-29 ENCOUNTER — Encounter (HOSPITAL_COMMUNITY): Payer: Self-pay | Admitting: *Deleted

## 2016-02-29 ENCOUNTER — Emergency Department (HOSPITAL_COMMUNITY): Payer: Medicare Other

## 2016-02-29 ENCOUNTER — Emergency Department (HOSPITAL_COMMUNITY)
Admission: EM | Admit: 2016-02-29 | Discharge: 2016-02-29 | Disposition: A | Discharge status: Home / Self Care | Payer: Medicare Other | Attending: Emergency Medicine | Admitting: Emergency Medicine

## 2016-02-29 DIAGNOSIS — R109 Unspecified abdominal pain: Secondary | ICD-10-CM | POA: Diagnosis present

## 2016-02-29 DIAGNOSIS — N201 Calculus of ureter: Secondary | ICD-10-CM

## 2016-02-29 DIAGNOSIS — N200 Calculus of kidney: Secondary | ICD-10-CM | POA: Insufficient documentation

## 2016-02-29 DIAGNOSIS — Z79899 Other long term (current) drug therapy: Secondary | ICD-10-CM | POA: Insufficient documentation

## 2016-02-29 DIAGNOSIS — F329 Major depressive disorder, single episode, unspecified: Secondary | ICD-10-CM | POA: Insufficient documentation

## 2016-02-29 DIAGNOSIS — Z87891 Personal history of nicotine dependence: Secondary | ICD-10-CM | POA: Diagnosis not present

## 2016-02-29 LAB — CBC WITH DIFFERENTIAL/PLATELET
BASOS ABS: 0.1 10*3/uL (ref 0.0–0.1)
BASOS PCT: 1 %
EOS ABS: 0.1 10*3/uL (ref 0.0–0.7)
EOS PCT: 2 %
HEMATOCRIT: 45 % (ref 36.0–46.0)
Hemoglobin: 14.8 g/dL (ref 12.0–15.0)
LYMPHS ABS: 2.8 10*3/uL (ref 0.7–4.0)
Lymphocytes Relative: 37 %
MCH: 30.6 pg (ref 26.0–34.0)
MCHC: 32.9 g/dL (ref 30.0–36.0)
MCV: 93.2 fL (ref 78.0–100.0)
MONOS PCT: 8 %
Monocytes Absolute: 0.6 10*3/uL (ref 0.1–1.0)
NEUTROS PCT: 52 %
Neutro Abs: 3.9 10*3/uL (ref 1.7–7.7)
PLATELETS: 234 10*3/uL (ref 150–400)
RBC: 4.83 MIL/uL (ref 3.87–5.11)
RDW: 13.7 % (ref 11.5–15.5)
WBC: 7.4 10*3/uL (ref 4.0–10.5)

## 2016-02-29 LAB — BASIC METABOLIC PANEL
ANION GAP: 10 (ref 5–15)
BUN: 17 mg/dL (ref 6–20)
CALCIUM: 9 mg/dL (ref 8.9–10.3)
CO2: 23 mmol/L (ref 22–32)
Chloride: 107 mmol/L (ref 101–111)
Creatinine, Ser: 0.88 mg/dL (ref 0.44–1.00)
Glucose, Bld: 104 mg/dL — ABNORMAL HIGH (ref 65–99)
Potassium: 3.6 mmol/L (ref 3.5–5.1)
Sodium: 140 mmol/L (ref 135–145)

## 2016-02-29 LAB — URINALYSIS, ROUTINE W REFLEX MICROSCOPIC
BILIRUBIN URINE: NEGATIVE
Glucose, UA: NEGATIVE mg/dL
KETONES UR: NEGATIVE mg/dL
Leukocytes, UA: NEGATIVE
NITRITE: NEGATIVE
PH: 6.5 (ref 5.0–8.0)
Protein, ur: NEGATIVE mg/dL
SPECIFIC GRAVITY, URINE: 1.015 (ref 1.005–1.030)

## 2016-02-29 LAB — URINE MICROSCOPIC-ADD ON

## 2016-02-29 MED ORDER — OXYCODONE-ACETAMINOPHEN 5-325 MG PO TABS: 1.0000 | ORAL_TABLET | Freq: Four times a day (QID) | ORAL | Status: DC | PRN

## 2016-02-29 MED ORDER — KETOROLAC TROMETHAMINE 30 MG/ML IJ SOLN
30.0000 mg | Freq: Once | INTRAMUSCULAR | Status: AC
  Administered 2016-02-29: 30 mg via INTRAVENOUS
  Filled 2016-02-29: qty 1

## 2016-02-29 MED ORDER — TAMSULOSIN HCL 0.4 MG PO CAPS: 0.4000 mg | ORAL_CAPSULE | Freq: Every day | ORAL | Status: DC

## 2016-02-29 MED ORDER — ONDANSETRON HCL 4 MG PO TABS: 4.0000 mg | ORAL_TABLET | Freq: Three times a day (TID) | ORAL | Status: DC | PRN

## 2016-02-29 MED ORDER — TAMSULOSIN HCL 0.4 MG PO CAPS
0.4000 mg | ORAL_CAPSULE | Freq: Once | ORAL | Status: AC
  Administered 2016-02-29: 0.4 mg via ORAL
  Filled 2016-02-29: qty 1

## 2016-02-29 MED ORDER — MORPHINE SULFATE (PF) 4 MG/ML IV SOLN
6.0000 mg | Freq: Once | INTRAVENOUS | Status: AC
  Administered 2016-02-29: 6 mg via INTRAVENOUS
  Filled 2016-02-29: qty 2

## 2016-02-29 MED ORDER — OXYCODONE-ACETAMINOPHEN 5-325 MG PO TABS
2.0000 | ORAL_TABLET | Freq: Once | ORAL | Status: AC
  Administered 2016-02-29: 2 via ORAL
  Filled 2016-02-29: qty 2

## 2016-02-29 MED ORDER — SODIUM CHLORIDE 0.9 % IV BOLUS (SEPSIS)
1000.0000 mL | Freq: Once | INTRAVENOUS | Status: AC
  Administered 2016-02-29: 1000 mL via INTRAVENOUS

## 2016-02-29 NOTE — ED Notes (Signed)
Pt c/o severe pain to right flank radiating to right abdomen that started x 30 mins ago;

## 2016-02-29 NOTE — ED Provider Notes (Signed)
CSN: [id number]     Arrival date & time 02/29/16  1908 History   First MD Initiated Contact with Patient 02/29/16 1918     Chief Complaint  Patient presents with  . Flank Pain     (Consider location/radiation/quality/duration/timing/severity/associated sxs/prior Treatment) Patient is a 60 y.o. female presenting with flank pain.  Flank Pain This is a recurrent problem. The current episode started 1 to 2 hours ago. The problem occurs constantly. The problem has not changed since onset.Nothing aggravates the symptoms. Nothing relieves the symptoms. She has tried nothing for the symptoms. The treatment provided no relief.    Past Medical History  Diagnosis Date  . Fibromyalgia   . Chronic back pain   . Migraine   . Depression   . GERD (gastroesophageal reflux disease)   . Squamous cell carcinoma [facility])    Past Surgical History  Procedure Laterality Date  . Lumbar fusion    . Carpal tunnel release      right  . Cholecystectomy  1995  . Abdominal hysterectomy      fibroids, including cervix, left ovaries  . Tonsillectomy  1964  . Back surgery      multiple-see overview for back pain  . Hemorrhoid surgery    . Rotator cuff repair Left 2004  . Cervical fusion  1996   Family History  Problem Relation Age of Onset  . Heart attack Mother     Mom 43, Dad 65  . Hypertension Mother   . Heart disease Mother   . Heart disease Father   . Colon cancer Maternal Grandmother     not sure age of onset  . Diabetes Father    Social History  Substance Use Topics  . Smoking status: Former Smoker -- 1.25 packs/day for 40 years    Types: Cigarettes    Quit date: 06/07/2008  . Smokeless tobacco: Never Used     Comment: quit in 2009  . Alcohol Use: 0.0 oz/week    0 Standard drinks or equivalent per week     Comment: seldom   OB History    No data available     Review of Systems  Genitourinary: Positive for flank pain.  All other systems reviewed and are negative.     Allergies   Latex  Home Medications   Prior to Admission medications   Medication Sig Start Date End Date Taking? Authorizing Provider  allopurinol (ZYLOPRIM) 100 MG tablet Take 1 tablet by mouth  daily 09/23/15  Yes [name], MD  atorvastatin (LIPITOR) 40 MG tablet Take 1 tablet by mouth  daily 11/11/15  Yes [name], MD  baclofen (LIORESAL) 10 MG tablet Take 0.5-1 tablets (5-10 mg total) by mouth 3 (three) times daily. Patient taking differently: Take 5-10 mg by mouth 3 (three) times daily as needed for muscle spasms.  07/01/15  Yes [name], MD  bisoprolol (ZEBETA) 5 MG tablet Take 1 tablet (5 mg total) by mouth daily. Patient taking differently: Take 5 mg by mouth at bedtime.  08/04/15  Yes [name], MD  famotidine (PEPCID) 20 MG tablet One at bedtime Patient taking differently: Take 20 mg by mouth at bedtime. One at bedtime 08/04/15  Yes [name], MD  FLUoxetine [facility]) 40 MG capsule Take 1 capsule by mouth  daily 11/11/15  Yes [name], MD  pantoprazole (PROTONIX) 40 MG tablet Take 1 tablet (40 mg total) by mouth daily. Take 30-60 min before first meal of  the day 08/04/15  Yes [name], MD  topiramate (TOPAMAX) 200 MG tablet Take 1 tablet by mouth  daily 11/11/15  Yes [name], MD  albuterol (PROVENTIL HFA;VENTOLIN HFA) 108 (90 BASE) MCG/ACT inhaler Inhale 2 puffs into the lungs every 6 (six) hours as needed for wheezing or shortness of breath. 01/30/15   [name], MD  cyanocobalamin (,VITAMIN B-12,) 1000 MCG/ML injection Inject 1 mL (1,000 mcg total) into the muscle every 30 (thirty) days. 09/13/12   Ricard Dillon, MD  orlistat (XENICAL) 120 MG capsule Take 1 capsule (120 mg total) by mouth 3 (three) times daily with meals. 05/30/15   [name], MD  oxybutynin (DITROPAN) 5 MG tablet Take 1 tablet (5 mg total) by mouth 2 (two) times daily as needed for bladder spasms. 08/04/15   [name], MD  oxycodone-acetaminophen  (PERCOCET) 2.5-325 MG tablet Take 1 tablet by mouth every 4 (four) hours as needed for pain.    Historical Provider, MD   BP 164/89 mmHg  Pulse 77  Temp(Src) 98 F (36.7 C) (Oral)  Resp 20  Ht 5\' 2"  (1.575 m)  Wt 165 lb (74.844 kg)  BMI 30.17 kg/m2  SpO2 99% Physical Exam  Constitutional: She appears well-developed and well-nourished.  HENT:  Head: Normocephalic and atraumatic.  Neck: Normal range of motion.  Cardiovascular: Normal rate and regular rhythm.   Pulmonary/Chest: No stridor. No respiratory distress.  Abdominal: Soft. She exhibits no distension. There is no tenderness.  Neurological: She is alert.  Skin: Skin is warm and dry.  Nursing note and vitals reviewed.   ED Course  Procedures (including critical care time) Labs Review Labs Reviewed  CBC WITH DIFFERENTIAL/PLATELET  URINALYSIS, ROUTINE W REFLEX MICROSCOPIC (NOT AT [facility])  BASIC METABOLIC PANEL    Imaging Review No results found. I have personally reviewed and evaluated these images and lab results as part of my medical decision-making.   EKG Interpretation None      MDM   Final diagnoses:  None    Kidney stone with mild obstruction, no AKI/UTI on labs. Pain controlled will dc on pain meds/flomax. Will follow up with urology if symptoms not improving or here if pain worsens.   New Prescriptions: New Prescriptions   ONDANSETRON (ZOFRAN) 4 MG TABLET    Take 1 tablet (4 mg total) by mouth every 8 (eight) hours as needed for nausea or vomiting.   OXYCODONE-ACETAMINOPHEN (PERCOCET/ROXICET) 5-325 MG TABLET    Take 1-2 tablets by mouth every 6 (six) hours as needed for severe pain.   TAMSULOSIN (FLOMAX) 0.4 MG CAPS CAPSULE    Take 1 capsule (0.4 mg total) by mouth daily. Until stone passes     I have personally and contemperaneously reviewed labs and imaging and used in my decision making as above.   A medical screening exam was performed and I feel the patient has had an appropriate workup for  their chief complaint at this time and likelihood of emergent condition existing is low and thus workup can continue on an outpatient basis.. Their vital signs are stable. They have been counseled on decision, discharge, follow up and which symptoms necessitate immediate return to the emergency department.  They verbally stated understanding and agreement with plan and discharged in stable condition.      Merrily Pew, MD 02/29/16 2115

## 2016-03-03 ENCOUNTER — Ambulatory Visit (HOSPITAL_COMMUNITY): Payer: Medicare Other | Admitting: Occupational Therapy

## 2016-03-03 ENCOUNTER — Telehealth (HOSPITAL_COMMUNITY): Payer: Self-pay | Admitting: Occupational Therapy

## 2016-03-03 DIAGNOSIS — N390 Urinary tract infection, site not specified: Secondary | ICD-10-CM | POA: Diagnosis not present

## 2016-03-03 DIAGNOSIS — R112 Nausea with vomiting, unspecified: Secondary | ICD-10-CM | POA: Diagnosis not present

## 2016-03-03 DIAGNOSIS — N201 Calculus of ureter: Secondary | ICD-10-CM | POA: Diagnosis not present

## 2016-03-03 DIAGNOSIS — R3129 Other microscopic hematuria: Secondary | ICD-10-CM | POA: Diagnosis not present

## 2016-03-03 DIAGNOSIS — Z0000 Encounter for general adult medical examination without abnormal findings: Secondary | ICD-10-CM | POA: Diagnosis not present

## 2016-03-03 DIAGNOSIS — N132 Hydronephrosis with renal and ureteral calculous obstruction: Secondary | ICD-10-CM | POA: Diagnosis not present

## 2016-03-03 NOTE — Telephone Encounter (Signed)
Pt said to call her back she has a kidney stone on Sunday and she is suffering. NF

## 2016-03-05 ENCOUNTER — Ambulatory Visit (HOSPITAL_COMMUNITY): Payer: Medicare Other

## 2016-03-05 ENCOUNTER — Telehealth (HOSPITAL_COMMUNITY): Payer: Self-pay

## 2016-03-05 NOTE — Telephone Encounter (Signed)
She can not come in today, she will be here next week. NF

## 2016-03-08 DIAGNOSIS — M24112 Other articular cartilage disorders, left shoulder: Secondary | ICD-10-CM | POA: Diagnosis not present

## 2016-03-10 ENCOUNTER — Encounter (HOSPITAL_COMMUNITY): Payer: Self-pay | Admitting: Occupational Therapy

## 2016-03-10 ENCOUNTER — Ambulatory Visit (HOSPITAL_COMMUNITY): Payer: Medicare Other | Admitting: Occupational Therapy

## 2016-03-10 DIAGNOSIS — M25612 Stiffness of left shoulder, not elsewhere classified: Secondary | ICD-10-CM

## 2016-03-10 DIAGNOSIS — M25512 Pain in left shoulder: Secondary | ICD-10-CM | POA: Diagnosis not present

## 2016-03-10 DIAGNOSIS — R29898 Other symptoms and signs involving the musculoskeletal system: Secondary | ICD-10-CM | POA: Diagnosis not present

## 2016-03-10 NOTE — Therapy (Signed)
[place], [place], [address] Phone: [phone]   Fax:  [phone]  Occupational Therapy Treatment  Patient Details  Name: Alison Brooks MRN: [id number] Date of Birth: November 01, 1955 Referring Provider: Edmonia Lynch, MD  Encounter Date: 03/10/2016      OT End of Session - 03/10/16 1201    Visit Number 8   Number of Visits 12   Date for OT Re-Evaluation 03/16/16   Authorization Type [employer] HMO - $40.00 copay   Authorization Time Period before 10th visit   Authorization - Visit Number 8   Authorization - Number of Visits 10   OT Start Time 1114   OT Stop Time 1158   OT Time Calculation (min) 44 min   Activity Tolerance Patient tolerated treatment well   Behavior During Therapy [facility] for tasks assessed/performed      Past Medical History  Diagnosis Date  . Fibromyalgia   . Chronic back pain   . Migraine   . Depression   . GERD (gastroesophageal reflux disease)   . Squamous cell carcinoma [facility])     Past Surgical History  Procedure Laterality Date  . Lumbar fusion    . Carpal tunnel release      right  . Cholecystectomy  1995  . Abdominal hysterectomy      fibroids, including cervix, left ovaries  . Tonsillectomy  1964  . Back surgery      multiple-see overview for back pain  . Hemorrhoid surgery    . Rotator cuff repair Left 2004  . Cervical fusion  1996    There were no vitals filed for this visit.      Subjective Assessment - 03/10/16 1115    Subjective  S: I went to the doctor and he said I could continue or stop therapy when I'm ready.    Currently in Pain? Yes   Pain Score 3    Pain Location Shoulder   Pain Orientation Right   Pain Descriptors / Indicators Aching   Pain Type Acute pain   Pain Radiating Towards n/a   Pain Onset More than a month ago   Pain Frequency Intermittent   Aggravating Factors  use, movement   Pain Relieving Factors rest, pain medds   Effect of  Pain on Daily Activities difficulty using LUE during daily tasks   Multiple Pain Sites No            [facility] OT Assessment - 03/10/16 1112    Assessment   Diagnosis Left bicep tenodesis   Precautions   Precautions Other (comment)   Precaution Comments No lifting or completing any activity overhead.                   OT Treatments/Exercises (OP) - 03/10/16 1118    Exercises   Exercises Shoulder   Shoulder Exercises: Supine   Protraction PROM;5 reps;Strengthening;12 reps   Protraction Weight (lbs) 1   Horizontal ABduction PROM;5 reps;Strengthening;12 reps   Horizontal ABduction Weight (lbs) 1   External Rotation PROM;5 reps;Strengthening;12 reps   External Rotation Weight (lbs) 1   Internal Rotation PROM;5 reps;Strengthening;12 reps   Internal Rotation Weight (lbs) 1   Flexion PROM;5 reps;Strengthening;12 reps   Shoulder Flexion Weight (lbs) 1   ABduction PROM;5 reps;Strengthening;12 reps   Shoulder ABduction Weight (lbs) 1   Shoulder Exercises: Seated   Protraction Strengthening;12 reps   Protraction Weight (lbs) 1   Horizontal ABduction Strengthening;12 reps   Horizontal  ABduction Weight (lbs) 1   External Rotation Strengthening;12 reps   External Rotation Weight (lbs) 1   Internal Rotation Strengthening;12 reps   Internal Rotation Weight (lbs) 1   Flexion Strengthening;12 reps   Flexion Weight (lbs) 1   Abduction Strengthening;12 reps   ABduction Weight (lbs) 1   Shoulder Exercises: ROM/Strengthening   UBE (Upper Arm Bike) Level 1 3 minutes forwards 3 minutes reverse   "W" Arms 10X   X to V Arms 10X 1# weight   Proximal Shoulder Strengthening, Supine 12X each 1# weight no rest breaks   Proximal Shoulder Strengthening, Seated 12X each 1# weight no rest break   Manual Therapy   Manual Therapy Myofascial release   Manual therapy comments manual therapy completed before therapeutic exercise this date   Myofascial Release Myofascial release to left upper  arm, deltoid, and trapezius regions to decrease pain and fascial restrictions and increase joint range of motion.                  OT Short Term Goals - 02/06/16 1205    OT SHORT TERM GOAL #1   Title Patient will be educated and independent with HEP to increase functional performance when using LUE.    Time 3   Period Weeks   Status On-going   OT SHORT TERM GOAL #2   Title Patient will increase P/ROM to WNL of LUE to increase ability to get shirts on and off with less difficulty.    Time 3   Period Weeks   Status On-going   OT SHORT TERM GOAL #3   Title Patient will increase LUE strength to 3+/5 to increase ability to complete activities at shoulder level.    Time 3   Period Weeks   Status On-going   OT SHORT TERM GOAL #4   Title Patient will decrease pain to 3/10 when completing daily tasks with LUE.   Time 3   Period Weeks   Status On-going           OT Long Term Goals - 02/06/16 1205    OT LONG TERM GOAL #1   Title Patient will return to highest level of independence with all daily tasks using LUE.    Time 6   Period Weeks   Status On-going   OT LONG TERM GOAL #2   Title Patient will increase A/ROM of LUE to WNL to increase ability to fix hair with less difficulty.    Time 6   Period Weeks   Status On-going   OT LONG TERM GOAL #3   Title Patient will decrease pain level to 2/10 or less when using LUE during daily tasks.    Time 6   Period Weeks   Status On-going   OT LONG TERM GOAL #4   Title Patient will increase LUE strength to 4+/5 to return to normal household chores with less difficulty.    Time 6   Period Weeks   Status On-going   OT LONG TERM GOAL #5   Title Patient will decrease fascial restrictions to trace amount or less in order to increase functional mobility of LUE.   Time 6   Period Weeks   Status On-going               Plan - 03/10/16 1201    Clinical Impression Statement A: Pt reports she feels like she has increased  stiffness over last week due to missing appointments with other health concerns. Pt has  been attempted to complete exercises at home when able. Pt went to MD and he has instructed her to continue therapy or discharge when she feels ready. Added 1# weight in supine and sitting today, pt with increased fatigue, minimal pain. Theraband exercises held due to neck pain.    Rehab Potential Excellent   OT Frequency 2x / week   OT Duration 6 weeks   OT Treatment/Interventions Self-care/ADL training;Ultrasound;Passive range of motion;Patient/family education;Cryotherapy;Electrical Stimulation;Moist Heat;Therapeutic activities;Therapeutic exercises;Manual Therapy   Plan P: UPDATE G-CODE. Continue with strengthening, resume ball on wall, theraband if able to tolerate.    Consulted and Agree with Plan of Care Patient      Patient will benefit from skilled therapeutic intervention in order to improve the following deficits and impairments:  Decreased strength, Pain, Impaired UE functional use, Increased fascial restricitons, Decreased range of motion  Visit Diagnosis: Other symptoms and signs involving the musculoskeletal system  Pain in left shoulder  Stiffness of left shoulder, not elsewhere classified    Problem List Patient Active Problem List   Diagnosis Date Noted  . Dyspnea 11/02/2014  . Upper airway cough syndrome 11/02/2014  . Gout 06/28/2014  . Vitamin B12 deficiency 06/28/2014  . Multiple pulmonary nodules 06/28/2014  . Essential hypertension 05/17/2014  . Obesity (BMI 30-39.9) 01/14/2014  . Lateral epicondylitis (tennis elbow) 01/02/2014  . GERD (gastroesophageal reflux disease) 03/26/2013  . LEG CRAMPS 05/16/2009  . RESTLESS LEG SYNDROME, SEVERE 03/14/2009  . Chronic interstitial cystitis 01/20/2009  . DEPRESSION 03/19/2008  . TUBULOVILLOUS ADENOMA, COLON 02/17/2008  . Hyperlipidemia 02/17/2008  . NEPHROLITHIASIS 02/17/2008  . Former smoker 12/21/2007  . MIGRAINE, CLASSICAL  W/O INTRACTABLE MIGRAINE 06/09/2007  . SYMPTOM, MEMORY LOSS 06/09/2007  . 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 FIBROMYALGIA 04/21/2007    Guadelupe Sabin, OTR/L  [phone]  03/10/2016, 12:04 PM  [facility] [address] [place], [place], [address] Phone: [phone]   Fax:  [phone]  Name: Alison Brooks MRN: [id number] Date of Birth: June 29, 1956

## 2016-03-11 ENCOUNTER — Encounter: Payer: Self-pay | Admitting: Family Medicine

## 2016-03-11 ENCOUNTER — Ambulatory Visit (INDEPENDENT_AMBULATORY_CARE_PROVIDER_SITE_OTHER): Payer: Medicare Other | Admitting: Family Medicine

## 2016-03-11 VITALS — BP 142/82 | HR 76 | Temp 98.3°F | Ht 61.0 in | Wt 176.0 lb

## 2016-03-11 DIAGNOSIS — E538 Deficiency of other specified B group vitamins: Secondary | ICD-10-CM | POA: Diagnosis not present

## 2016-03-11 DIAGNOSIS — Z0000 Encounter for general adult medical examination without abnormal findings: Secondary | ICD-10-CM

## 2016-03-11 MED ORDER — CYANOCOBALAMIN 1000 MCG/ML IJ SOLN
1000.0000 ug | INTRAMUSCULAR | Status: DC
  Administered 2016-03-11: 1000 ug via INTRAMUSCULAR

## 2016-03-11 NOTE — Progress Notes (Signed)
Phone: [phone]  Subjective:  Patient presents today for their annual physical. Chief complaint-noted.   See problem oriented charting- ROS- full  review of systems was completed and negative -No chest pain or shortness of breath. No headache or blurry vision. But does have right flank pain continue dissues since kidney stone.    The following were reviewed and entered/updated in epic: Past Medical History  Diagnosis Date  . Fibromyalgia   . Chronic back pain   . Migraine   . Depression   . GERD (gastroesophageal reflux disease)   . Squamous cell carcinoma [facility])    Patient Active Problem List   Diagnosis Date Noted  . 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 Gout 06/28/2014    Priority: Medium  . Vitamin B12 deficiency 06/28/2014    Priority: Medium  . Multiple pulmonary nodules 06/28/2014    Priority: Medium  . Essential hypertension 05/17/2014    Priority: Medium  . RESTLESS LEG SYNDROME, SEVERE 03/14/2009    Priority: Medium  . Chronic interstitial cystitis 01/20/2009    Priority: Medium  . DEPRESSION 03/19/2008    Priority: Medium  . Hyperlipidemia 02/17/2008    Priority: Medium  . Former smoker 12/21/2007    Priority: Medium  . FIBROMYALGIA 04/21/2007    Priority: Medium  . Obesity (BMI 30-39.9) 01/14/2014    Priority: Low  . Lateral epicondylitis (tennis elbow) 01/02/2014    Priority: Low  . GERD (gastroesophageal reflux disease) 03/26/2013    Priority: Low  . LEG CRAMPS 05/16/2009    Priority: Low  . TUBULOVILLOUS ADENOMA, COLON 02/17/2008    Priority: Low  . NEPHROLITHIASIS 02/17/2008    Priority: Low  . MIGRAINE, CLASSICAL W/O INTRACTABLE MIGRAINE 06/09/2007    Priority: Low  . SYMPTOM, MEMORY LOSS 06/09/2007    Priority: Low  . Dyspnea 11/02/2014  . Upper airway cough syndrome 11/02/2014   Past Surgical History  Procedure Laterality Date  . Lumbar fusion    . Carpal tunnel release       right  . Cholecystectomy  1995  . Abdominal hysterectomy      fibroids, including cervix, left ovaries  . Tonsillectomy  1964  . Back surgery      multiple-see overview for back pain  . Hemorrhoid surgery    . Rotator cuff repair Left 2004  . Cervical fusion  1996    Family History  Problem Relation Age of Onset  . Heart attack Mother     Mom 45, Dad 30  . Hypertension Mother   . Heart disease Mother   . Heart disease Father   . Colon cancer Maternal Grandmother     not sure age of onset  . Diabetes Father     Medications- reviewed and updated Current Outpatient Prescriptions  Medication Sig Dispense Refill  . albuterol (PROVENTIL HFA;VENTOLIN HFA) 108 (90 BASE) MCG/ACT inhaler Inhale 2 puffs into the lungs every 6 (six) hours as needed for wheezing or shortness of breath. 1 Inhaler 0  . allopurinol (ZYLOPRIM) 100 MG tablet Take 1 tablet by mouth  daily 90 tablet 2  . atorvastatin (LIPITOR) 40 MG tablet Take 1 tablet by mouth  daily 90 tablet 3  . baclofen (LIORESAL) 10 MG tablet Take 0.5-1 tablets (5-10 mg total) by mouth 3 (three) times daily. (Patient taking differently: Take 5-10 mg by mouth 3 (three) times daily as needed for muscle spasms. ) 180 each 2  .  bisoprolol (ZEBETA) 5 MG tablet Take 1 tablet (5 mg total) by mouth daily. (Patient taking differently: Take 5 mg by mouth at bedtime. ) 90 tablet 3  . cyanocobalamin (,VITAMIN B-12,) 1000 MCG/ML injection Inject 1 mL (1,000 mcg total) into the muscle every 30 (thirty) days. 10 mL 1  . famotidine (PEPCID) 20 MG tablet One at bedtime (Patient taking differently: Take 20 mg by mouth at bedtime. One at bedtime) 90 tablet 3  . FLUoxetine (PROZAC) 40 MG capsule Take 1 capsule by mouth  daily 90 capsule 3  . ondansetron (ZOFRAN) 4 MG tablet Take 1 tablet (4 mg total) by mouth every 8 (eight) hours as needed for nausea or vomiting. 30 tablet 0  . oxybutynin (DITROPAN) 5 MG tablet Take 1 tablet (5 mg total) by mouth 2 (two)  times daily as needed for bladder spasms. 180 tablet 1  . oxyCODONE-acetaminophen (PERCOCET/ROXICET) 5-325 MG tablet Take 1-2 tablets by mouth every 6 (six) hours as needed for severe pain. 30 tablet 0  . pantoprazole (PROTONIX) 40 MG tablet Take 1 tablet (40 mg total) by mouth daily. Take 30-60 min before first meal of the day 90 tablet 3  . tamsulosin (FLOMAX) 0.4 MG CAPS capsule Take 1 capsule (0.4 mg total) by mouth daily. Until stone passes 30 capsule 0  . topiramate (TOPAMAX) 200 MG tablet Take 1 tablet by mouth  daily 90 tablet 3  . [DISCONTINUED] orlistat (XENICAL) 120 MG capsule Take 1 capsule (120 mg total) by mouth 3 (three) times daily with meals. 90 capsule 5   No current facility-administered medications for this visit.    Allergies-reviewed and updated Allergies  Allergen Reactions  . Latex Other (See Comments)    blisters    Social History   Social History  . Marital Status: Married    Spouse Name: Antony Haste  . Number of Children: 1  . Years of Education: N/A   Occupational History  . retired Marine scientist    Social History Main Topics  . Smoking status: Former Smoker -- 1.25 packs/day for 40 years    Types: Cigarettes    Quit date: 06/07/2008  . Smokeless tobacco: Never Used     Comment: quit in 2009  . Alcohol Use: 0.0 oz/week    0 Standard drinks or equivalent per week     Comment: seldom  . Drug Use: No  . Sexual Activity: Yes   Other Topics Concern  . Not on file   Social History Narrative   Married 43 years in December. 1 child. 2 grandkids (boy and girl). Lives 10 miles away.       Disabled.       Hobbies: previously enjoyed dancing, old car shoes (21 chevy), reading    Objective: BP 142/82 mmHg  Pulse 76  Temp(Src) 98.3 F (36.8 C) (Oral)  Ht 5\' 1"  (1.549 m)  Wt 176 lb (79.833 kg)  BMI 33.27 kg/m2 Gen: NAD, resting comfortably, appears uncomfortable and holds at right side at times HEENT: Mucous membranes are moist. Oropharynx normal Neck: no  thyromegaly CV: RRR no murmurs rubs or gallops Lungs: CTAB no crackles, wheeze, rhonchi Abdomen: soft/nontender/nondistended/normal bowel sounds. No rebound or guarding.  Ext: no edema Has cva tenderness Skin: warm, dry Neuro: grossly normal, moves all extremities, PERRLA  Assessment/Plan:  60 y.o. female presenting for annual physical.  Health Maintenance counseling: 1. Anticipatory guidance: Patient counseled regarding regular dental exams, eye exams, wearing seatbelts.  2. Risk factor reduction:  Advised patient of  need for regular exercise and diet rich and fruits and vegetables to reduce risk of heart attack and stroke. 5 lbs up- knows she needs to reverse the trend.  3. Immunizations/screenings/ancillary studies Immunization History  Administered Date(s) Administered  . Influenza Whole 08/11/1999, 08/11/2007, 07/25/2009  . Influenza,inj,Quad PF,36+ Mos 09/03/2013, 07/12/2014, 08/04/2015  . Td 10/25/1998, 10/25/2006  4. Cervical cancer screening- hysterectomy including cervix- no pap per GYN 5. Breast cancer screening-  bre[facility] exam at [facility] and mammogram 06/04/15. She was supposed to have follow up as had biopsy and was told needed another- kidney stones have gotten in the way, and when she gets better she will reutrn 6. Colon cancer screening - 06/21/13 with 5 year repeat 7. Skin cancer screening- plans to go see dermatology  Status of chronic or acute concerns  1. Chronic low back pain- s/p spinal cord implant. Previously on methadone. Now on topamax alone. Occasional baclofen  2. Hyperlipidemia- excellent control on atorvastatin 40mg .   3. Former smoker- quit in 2009 with 40-50 pack years  4. Depression controlled on prozac  5. Restless legs- topamax alos helps for this  6. Interstitial cystitis- self caths in past- no longer needing to. botox if needed none in recent memory. On oxybutynin but just as needed  7. Hypertension- see below  8. GOut- colchicine BID scheduled  under Dr. Arnoldo Morale- we cnverted to allpurinol 100mg  and no attacks since that time  9. Vitamin b12- see below  10. Multiple pulmonary nodules- repeat ct at next visit- would be stable for 2 years at that point  56. Recent kidney stone- still has not passed. [facility] changed her to tramadol and phenergan due to side effects- see allergy listed. Also on flomax.      No problem-specific assessment & plan notes found for this encounter.   No Follow-up on file. Return precautions advised.   No orders of the defined types were placed in this encounter.    No orders of the defined types were placed in this encounter.    Garret Reddish, MD

## 2016-03-11 NOTE — Addendum Note (Signed)
Addended by: Jerl Santos R on: 03/11/2016 02:09 PM   Modules accepted: Orders

## 2016-03-11 NOTE — Assessment & Plan Note (Signed)
on lisinopril 20mg , propranolol 40mg  previously but later changed to bisoprolol 5mg  by Dr. Melvyn Novas. Bp slightly high today but having some pain from kidney stone. Home monitoring before kidney stones mainly 120s and 130s- will not make adjustments at present

## 2016-03-11 NOTE — Assessment & Plan Note (Signed)
Has been a few months. Will restart injections today and restart monthly visits.

## 2016-03-11 NOTE — Patient Instructions (Addendum)
Need to lose the 5 lbs you gained but now is not the time- get better from the kidney stone first  Blood pressure slightly up but you are in pain so we will interpret hopefully not in pain at next visit  Glad gout is at [place]  Next visit- need to update your CT likely  b12 shot today, then schedule once a month for these

## 2016-03-12 ENCOUNTER — Ambulatory Visit (HOSPITAL_COMMUNITY): Payer: Medicare Other

## 2016-03-12 DIAGNOSIS — R29898 Other symptoms and signs involving the musculoskeletal system: Secondary | ICD-10-CM | POA: Diagnosis not present

## 2016-03-12 DIAGNOSIS — M25612 Stiffness of left shoulder, not elsewhere classified: Secondary | ICD-10-CM

## 2016-03-12 DIAGNOSIS — M25512 Pain in left shoulder: Secondary | ICD-10-CM

## 2016-03-12 NOTE — Therapy (Signed)
[place], [place], [address] Phone: [phone]   Fax:  [phone]  Occupational Therapy Treatment  Patient Details  Name: Alison Brooks MRN: [id number] Date of Birth: [birth year] Referring Provider: Edmonia Lynch, MD  Encounter Date: 03/12/2016      OT End of Session - 03/12/16 1201    Visit Number 9   Number of Visits 12   Date for OT Re-Evaluation 03/16/16   Authorization Type [employer] Medicare HMO - $40.00 copay   Authorization Time Period before 19th visit   Authorization - Visit Number 9   Authorization - Number of Visits 19   OT Start Time (p) 1123   OT Stop Time (p) 1202   OT Time Calculation (min) (p) 39 min   Activity Tolerance Patient tolerated treatment well   Behavior During Therapy WFL for tasks assessed/performed      Past Medical History  Diagnosis Date  . Fibromyalgia   . Chronic back pain   . Migraine   . Depression   . GERD (gastroesophageal reflux disease)   . Squamous cell carcinoma [facility])     Past Surgical History  Procedure Laterality Date  . Lumbar fusion    . Carpal tunnel release      right  . Cholecystectomy  1995  . Abdominal hysterectomy      fibroids, including cervix, left ovaries  . Tonsillectomy  1964  . Back surgery      multiple-see overview for back pain  . Hemorrhoid surgery    . Rotator cuff repair Left 2004  . Cervical fusion  1996  . Biceps tendon repair Left 2017    [facility]phy/[facility]ainer- [facility] [facility]    There were no vitals filed for this visit.                    OT Treatments/Exercises (OP) - 03/12/16 0001    Exercises   Exercises Shoulder   Shoulder Exercises: Supine   Protraction PROM;5 reps;Strengthening;12 reps   Protraction Weight (lbs) 1   Horizontal ABduction PROM;5 reps;Strengthening;12 reps   Horizontal ABduction Weight (lbs) 1   External Rotation PROM;5 reps;Strengthening;12 reps   External Rotation Weight (lbs) 1    Internal Rotation PROM;5 reps;Strengthening;12 reps   Internal Rotation Weight (lbs) 1   Flexion PROM;5 reps;Strengthening;12 reps   Shoulder Flexion Weight (lbs) 1   ABduction PROM;5 reps;Strengthening;12 reps   Shoulder ABduction Weight (lbs) 1   Shoulder Exercises: Seated   Protraction Strengthening;12 reps   Protraction Weight (lbs) 1   Horizontal ABduction Strengthening;12 reps   Horizontal ABduction Weight (lbs) 1   External Rotation Strengthening;12 reps   External Rotation Weight (lbs) 1   Internal Rotation Strengthening;12 reps   Internal Rotation Weight (lbs) 1   Flexion Strengthening;12 reps   Flexion Weight (lbs) 1   Abduction Strengthening;12 reps   ABduction Weight (lbs) 1   Shoulder Exercises: ROM/Strengthening   UBE (Upper Arm Bike) Level 1 3 minutes forwards 3 minutes reverse   "W" Arms 10X with 1#   X to V Arms 10X 1# weight   Proximal Shoulder Strengthening, Supine 12X each 1# weight no rest breaks   Proximal Shoulder Strengthening, Seated 12X each 1# weight no rest break   Manual Therapy   Manual Therapy Myofascial release   Manual therapy comments manual therapy completed before therapeutic exercise this date   Myofascial Release Myofascial release to left upper arm, deltoid, and trapezius regions to decrease pain  and fascial restrictions and increase joint range of motion.                  OT Short Term Goals - 02/06/16 1205    OT SHORT TERM GOAL #1   Title Patient will be educated and independent with HEP to increase functional performance when using LUE.    Time 3   Period Weeks   Status On-going   OT SHORT TERM GOAL #2   Title Patient will increase P/ROM to WNL of LUE to increase ability to get shirts on and off with less difficulty.    Time 3   Period Weeks   Status On-going   OT SHORT TERM GOAL #3   Title Patient will increase LUE strength to 3+/5 to increase ability to complete activities at shoulder level.    Time 3   Period Weeks    Status On-going   OT SHORT TERM GOAL #4   Title Patient will decrease pain to 3/10 when completing daily tasks with LUE.   Time 3   Period Weeks   Status On-going           OT Long Term Goals - 02/06/16 1205    OT LONG TERM GOAL #1   Title Patient will return to highest level of independence with all daily tasks using LUE.    Time 6   Period Weeks   Status On-going   OT LONG TERM GOAL #2   Title Patient will increase A/ROM of LUE to WNL to increase ability to fix hair with less difficulty.    Time 6   Period Weeks   Status On-going   OT LONG TERM GOAL #3   Title Patient will decrease pain level to 2/10 or less when using LUE during daily tasks.    Time 6   Period Weeks   Status On-going   OT LONG TERM GOAL #4   Title Patient will increase LUE strength to 4+/5 to return to normal household chores with less difficulty.    Time 6   Period Weeks   Status On-going   OT LONG TERM GOAL #5   Title Patient will decrease fascial restrictions to trace amount or less in order to increase functional mobility of LUE.   Time 6   Period Weeks   Status On-going               Plan - 2016/04/01 1342    Clinical Impression Statement A: G code updated. Patient reported 9/10 pain level at beginning of session. By end of session, pain had decreased to 3/10. Pt's biggest deficit at this time is pain level during daily tasks. Patient does report improvement with strength and ROM.    Plan P: Resume theraband exercises and continue strengthening. Add prone exercises if able to tolerate.       Patient will benefit from skilled therapeutic intervention in order to improve the following deficits and impairments:  Decreased strength, Pain, Impaired UE functional use, Increased fascial restricitons, Decreased range of motion  Visit Diagnosis: Other symptoms and signs involving the musculoskeletal system  Pain in left shoulder  Stiffness of left shoulder, not elsewhere classified       G-Codes - 04-01-16 1346    Functional Assessment Tool Used FOTO score: 61/100 (39% impaired)   Functional Limitation Carrying, moving and handling objects   Carrying, Moving and Handling Objects Current Status [id number]) At least 20 percent but less than 40 percent impaired, limited or restricted  Carrying, Moving and Handling Objects Goal Status [phone]) At least 1 percent but less than 20 percent impaired, limited or restricted      Problem List Patient Active Problem List   Diagnosis Date Noted  . Dyspnea 11/02/2014  . Upper airway cough syndrome 11/02/2014  . Gout 06/28/2014  . Vitamin B12 deficiency 06/28/2014  . Multiple pulmonary nodules 06/28/2014  . Essential hypertension 05/17/2014  . Obesity (BMI 30-39.9) 01/14/2014  . Lateral epicondylitis (tennis elbow) 01/02/2014  . GERD (gastroesophageal reflux disease) 03/26/2013  . LEG CRAMPS 05/16/2009  . RESTLESS LEG SYNDROME, SEVERE 03/14/2009  . Chronic interstitial cystitis 01/20/2009  . DEPRESSION 03/19/2008  . TUBULOVILLOUS ADENOMA, COLON 02/17/2008  . Hyperlipidemia 02/17/2008  . NEPHROLITHIASIS 02/17/2008  . Former smoker 12/21/2007  . MIGRAINE, CLASSICAL W/O INTRACTABLE MIGRAINE 06/09/2007  . SYMPTOM, MEMORY LOSS 06/09/2007  . 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 FIBROMYALGIA 04/21/2007    Ailene Ravel, OTR/L,CBIS  [phone]  03/12/2016, 1:49 PM  [facility] [address] [place], [place], [address] Phone: [phone]   Fax:  [phone]  Name: GIHANNA UNGERMAN MRN: [id number] Date of Birth: 1956/10/05

## 2016-03-16 ENCOUNTER — Encounter (HOSPITAL_COMMUNITY): Payer: Medicare Other | Admitting: Occupational Therapy

## 2016-03-16 ENCOUNTER — Telehealth (HOSPITAL_COMMUNITY): Payer: Self-pay

## 2016-03-16 NOTE — Telephone Encounter (Signed)
03/16/16 called to say she couldn't come in today

## 2016-03-19 ENCOUNTER — Encounter (HOSPITAL_COMMUNITY): Payer: Self-pay

## 2016-03-19 ENCOUNTER — Ambulatory Visit (HOSPITAL_COMMUNITY): Payer: Medicare Other

## 2016-03-19 DIAGNOSIS — M25612 Stiffness of left shoulder, not elsewhere classified: Secondary | ICD-10-CM

## 2016-03-19 DIAGNOSIS — R29898 Other symptoms and signs involving the musculoskeletal system: Secondary | ICD-10-CM | POA: Diagnosis not present

## 2016-03-19 DIAGNOSIS — M25512 Pain in left shoulder: Secondary | ICD-10-CM

## 2016-03-19 NOTE — Therapy (Signed)
[place], [place], [address] Phone: [phone]   Fax:  [phone]  Occupational Therapy Treatment  Patient Details  Name: Alison Brooks MRN: [id number] Date of Birth: [birth year] Referring Provider: Edmonia Lynch, MD  Encounter Date: 03/19/2016      OT End of Session - 03/19/16 1055    Visit Number 10   Number of Visits 12   Date for OT Re-Evaluation 04/23/16   Authorization Type [employer] Medicare HMO - $40.00 copay   Authorization Time Period before 19th visit   Authorization - Visit Number 10   Authorization - Number of Visits 19   OT Start Time 0940   OT Stop Time 1025   OT Time Calculation (min) 45 min   Activity Tolerance Patient tolerated treatment well   Behavior During Therapy [facility] for tasks assessed/performed      Past Medical History  Diagnosis Date  . Fibromyalgia   . Chronic back pain   . Migraine   . Depression   . GERD (gastroesophageal reflux disease)   . Squamous cell carcinoma [facility])     Past Surgical History  Procedure Laterality Date  . Lumbar fusion    . Carpal tunnel release      right  . Cholecystectomy  1995  . Abdominal hysterectomy      fibroids, including cervix, left ovaries  . Tonsillectomy  1964  . Back surgery      multiple-see overview for back pain  . Hemorrhoid surgery    . Rotator cuff repair Left 2004  . Cervical fusion  1996  . Biceps tendon repair Left 2017    [facility]ainer- [place] [facility]    There were no vitals filed for this visit.      Subjective Assessment - 03/19/16 0957    Subjective  S: I fell that just missing those few days has really tightened me up.    Currently in Pain? Yes   Pain Score 3    Pain Location Shoulder   Pain Orientation Right   Pain Descriptors / Indicators Aching   Pain Type Acute pain            [facility] OT Assessment - 03/19/16 0958    Assessment   Diagnosis Left bicep tenodesis   Precautions   Precautions None   ROM / Strength   AROM / PROM / Strength AROM;PROM;Strength   AROM   Overall AROM Comments Assessed seated. IR/er adducted   AROM Assessment Site Shoulder   Right/Left Shoulder Left   Left Shoulder Flexion 160 Degrees  previuos: 112   Left Shoulder ABduction 150 Degrees  previous; 99   Left Shoulder Internal Rotation 90 Degrees  previous: same   Left Shoulder External Rotation 58 Degrees  previous: 50   PROM   Overall PROM Comments Assessed supine. IR/er adducted.    PROM Assessment Site Shoulder   Right/Left Shoulder Left   Left Shoulder Flexion 163 Degrees  previous: 152   Left Shoulder ABduction 180 Degrees  previous: 142   Left Shoulder Internal Rotation 90 Degrees  previous: same   Left Shoulder External Rotation 75 Degrees  previous: 60   Strength   Overall Strength Comments Assessed seated. IR/er adducted. Strength was not assessed at evaluation.    Strength Assessment Site Shoulder   Right/Left Shoulder Left   Left Shoulder Flexion 4-/5   Left Shoulder ABduction 4/5   Left Shoulder Internal Rotation 4-/5   Left Shoulder External Rotation 4/5  OT Treatments/Exercises (OP) - 03/19/16 0959    Exercises   Exercises Shoulder   Shoulder Exercises: Supine   Protraction PROM;5 reps;Strengthening;12 reps   Protraction Weight (lbs) 1   Horizontal ABduction PROM;5 reps;Strengthening;12 reps   Horizontal ABduction Weight (lbs) 1   External Rotation PROM;5 reps;Strengthening;12 reps   External Rotation Limitations 1   Internal Rotation PROM;5 reps;Strengthening;12 reps   Internal Rotation Weight (lbs) 1   Flexion PROM;5 reps;Strengthening;12 reps   Shoulder Flexion Weight (lbs) 1   ABduction PROM;5 reps;Strengthening;12 reps   Shoulder ABduction Weight (lbs) 1   Shoulder Exercises: Seated   Protraction Strengthening;12 reps   Protraction Weight (lbs) 1   Horizontal ABduction Strengthening;12 reps   Horizontal ABduction Weight (lbs) 1    External Rotation Strengthening;12 reps   External Rotation Weight (lbs) 1   Internal Rotation Strengthening;12 reps   Internal Rotation Weight (lbs) 1   Flexion Strengthening;12 reps   Flexion Weight (lbs) 1   Abduction Strengthening;12 reps   ABduction Weight (lbs) 1   Shoulder Exercises: ROM/Strengthening   UBE (Upper Arm Bike) Level 1 3 minutes forwards 3 minutes reverse   "W" Arms 12X with 1# weight   X to V Arms 12X with 1# weight   Proximal Shoulder Strengthening, Seated 12X each 1# weight no rest break   Ball on Wall 1' flexion 1' abduction   Manual Therapy   Manual Therapy Myofascial release   Manual therapy comments manual therapy completed before therapeutic exercise this date   Myofascial Release Myofascial release to left upper arm, deltoid, and trapezius regions to decrease pain and fascial restrictions and increase joint range of motion.                  OT Short Term Goals - 03/19/16 1008    OT SHORT TERM GOAL #1   Title Patient will be educated and independent with HEP to increase functional performance when using LUE.    Time 3   Period Weeks   Status Achieved   OT SHORT TERM GOAL #2   Title Patient will increase P/ROM to WNL of LUE to increase ability to get shirts on and off with less difficulty.    Time 3   Period Weeks   Status Achieved   OT SHORT TERM GOAL #3   Title Patient will increase LUE strength to 3+/5 to increase ability to complete activities at shoulder level.    Time 3   Period Weeks   Status Achieved   OT SHORT TERM GOAL #4   Title Patient will decrease pain to 3/10 when completing daily tasks with LUE.   Time 3   Period Weeks   Status On-going           OT Long Term Goals - 03/19/16 1009    OT LONG TERM GOAL #1   Title Patient will return to highest level of independence with all daily tasks using LUE.    Time 6   Period Weeks   Status On-going   OT LONG TERM GOAL #2   Title Patient will increase A/ROM of LUE to WNL  to increase ability to fix hair with less difficulty.    Time 6   Period Weeks   Status On-going   OT LONG TERM GOAL #3   Title Patient will decrease pain level to 2/10 or less when using LUE during daily tasks.    Time 6   Period Weeks   Status On-going   OT  LONG TERM GOAL #4   Title Patient will increase LUE strength to 4+/5 to return to normal household chores with less difficulty.    Time 6   Period Weeks   Status On-going   OT LONG TERM GOAL #5   Title Patient will decrease fascial restrictions to trace amount or less in order to increase functional mobility of LUE.   Time 6   Period Weeks   Status On-going               Plan - 03/19/16 1103    Clinical Impression Statement A: Reassessment completed this date. patient has met 3/4 STGs and no long term goals at this time. Patient reports that her pain level recently has increased due to missing several appointments. ROM and strength have improved since intiial evaluation.    Plan P: Complete OT services 2x a week for 4 more weeks to focus on strength, ROM and pain level. Resume theraband exercises.    Consulted and Agree with Plan of Care Patient      Patient will benefit from skilled therapeutic intervention in order to improve the following deficits and impairments:  Decreased strength, Pain, Impaired UE functional use, Increased fascial restricitons, Decreased range of motion  Visit Diagnosis: Other symptoms and signs involving the musculoskeletal system - Plan: Ot plan of care cert/re-cert  Pain in left shoulder - Plan: Ot plan of care cert/re-cert  Stiffness of left shoulder, not elsewhere classified - Plan: Ot plan of care cert/re-cert    Problem List Patient Active Problem List   Diagnosis Date Noted  . Dyspnea 11/02/2014  . Upper airway cough syndrome 11/02/2014  . Gout 06/28/2014  . Vitamin B12 deficiency 06/28/2014  . Multiple pulmonary nodules 06/28/2014  . Essential hypertension 05/17/2014  .  Obesity (BMI 30-39.9) 01/14/2014  . Lateral epicondylitis (tennis elbow) 01/02/2014  . GERD (gastroesophageal reflux disease) 03/26/2013  . LEG CRAMPS 05/16/2009  . RESTLESS LEG SYNDROME, SEVERE 03/14/2009  . Chronic interstitial cystitis 01/20/2009  . DEPRESSION 03/19/2008  . TUBULOVILLOUS ADENOMA, COLON 02/17/2008  . Hyperlipidemia 02/17/2008  . NEPHROLITHIASIS 02/17/2008  . Former smoker 12/21/2007  . MIGRAINE, CLASSICAL W/O INTRACTABLE MIGRAINE 06/09/2007  . SYMPTOM, MEMORY LOSS 06/09/2007  . 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 FIBROMYALGIA 04/21/2007   Ailene Ravel, OTR/L,CBIS  [phone]  03/19/2016, 11:09 AM  [facility] [address] [place], [place], [address] Phone: [phone]   Fax:  [phone]  Name: BRYNNLEE CUMPIAN MRN: [id number] Date of Birth: [birth year]

## 2016-03-23 DIAGNOSIS — N201 Calculus of ureter: Secondary | ICD-10-CM | POA: Diagnosis not present

## 2016-03-23 DIAGNOSIS — N132 Hydronephrosis with renal and ureteral calculous obstruction: Secondary | ICD-10-CM | POA: Diagnosis not present

## 2016-03-24 ENCOUNTER — Ambulatory Visit (HOSPITAL_COMMUNITY): Payer: Medicare Other

## 2016-03-24 ENCOUNTER — Encounter (HOSPITAL_COMMUNITY): Payer: Self-pay

## 2016-03-24 DIAGNOSIS — R29898 Other symptoms and signs involving the musculoskeletal system: Secondary | ICD-10-CM | POA: Diagnosis not present

## 2016-03-24 DIAGNOSIS — M25512 Pain in left shoulder: Secondary | ICD-10-CM | POA: Diagnosis not present

## 2016-03-24 DIAGNOSIS — M25612 Stiffness of left shoulder, not elsewhere classified: Secondary | ICD-10-CM | POA: Diagnosis not present

## 2016-03-24 NOTE — Therapy (Signed)
[place], [place], [address] Phone: [phone]   Fax:  [phone]  Occupational Therapy Treatment  Patient Details  Name: Alison Brooks MRN: [id number] Date of Birth: [birth year] Referring Provider: Edmonia Lynch, MD  Encounter Date: 03/24/2016      OT End of Session - 03/24/16 1017    Visit Number 11   Number of Visits 12   Date for OT Re-Evaluation 04/23/16   Authorization Type [employer] Medicare HMO - $40.00 copay   Authorization Time Period before 19th visit   Authorization - Visit Number 11   Authorization - Number of Visits 71   OT Start Time [phone]  Session ended early per patient's request   OT Stop Time 1015   OT Time Calculation (min) 28 min   Activity Tolerance Patient tolerated treatment well   Behavior During Therapy [facility] for tasks assessed/performed      Past Medical History  Diagnosis Date  . Fibromyalgia   . Chronic back pain   . Migraine   . Depression   . GERD (gastroesophageal reflux disease)   . Squamous cell carcinoma [facility])     Past Surgical History  Procedure Laterality Date  . Lumbar fusion    . Carpal tunnel release      right  . Cholecystectomy  1995  . Abdominal hysterectomy      fibroids, including cervix, left ovaries  . Tonsillectomy  1964  . Back surgery      multiple-see overview for back pain  . Hemorrhoid surgery    . Rotator cuff repair Left 2004  . Cervical fusion  1996  . Biceps tendon repair Left 2017    [facility]phy/wainer- [place] [facility]    There were no vitals filed for this visit.      Subjective Assessment - 03/24/16 0949    Subjective  S: My shoulder feels pretty goos. I still have my kidney stone which is causing the most pain.   Currently in Pain? Yes   Pain Score 1    Pain Location Shoulder   Pain Orientation Left   Pain Descriptors / Indicators Aching   Pain Type Acute pain                      OT Treatments/Exercises (OP) -  03/24/16 1004    Exercises   Exercises Shoulder   Shoulder Exercises: Supine   Protraction PROM;5 reps;Strengthening;15 reps   Protraction Weight (lbs) 1   Horizontal ABduction PROM;5 reps;Strengthening;15 reps   Horizontal ABduction Weight (lbs) 1   External Rotation PROM;5 reps;Strengthening;15 reps  shoulder abducted   External Rotation Weight (lbs) 1   Internal Rotation PROM;5 reps;Strengthening;15 reps  shoulder abducted   Internal Rotation Weight (lbs) 1   Flexion PROM;5 reps;Strengthening;15 reps   Shoulder Flexion Weight (lbs) 1   ABduction PROM;5 reps;Strengthening;15 reps   Shoulder ABduction Weight (lbs) 1   Shoulder Exercises: ROM/Strengthening   Proximal Shoulder Strengthening, Supine 15X each 1# weight no rest breaks   Manual Therapy   Manual Therapy Myofascial release   Manual therapy comments manual therapy completed before therapeutic exercise this date   Myofascial Release Myofascial release to left upper arm, deltoid, and trapezius regions to decrease pain and fascial restrictions and increase joint range of motion.                  OT Short Term Goals - 03/24/16 1019    OT SHORT TERM GOAL #  1   Title Patient will be educated and independent with HEP to increase functional performance when using LUE.    Time 3   Period Weeks   OT SHORT TERM GOAL #2   Title Patient will increase P/ROM to WNL of LUE to increase ability to get shirts on and off with less difficulty.    Time 3   Period Weeks   OT SHORT TERM GOAL #3   Title Patient will increase LUE strength to 3+/5 to increase ability to complete activities at shoulder level.    Time 3   Period Weeks   OT SHORT TERM GOAL #4   Title Patient will decrease pain to 3/10 when completing daily tasks with LUE.   Time 3   Period Weeks   Status On-going           OT Long Term Goals - 03/19/16 1009    OT LONG TERM GOAL #1   Title Patient will return to highest level of independence with all daily  tasks using LUE.    Time 6   Period Weeks   Status On-going   OT LONG TERM GOAL #2   Title Patient will increase A/ROM of LUE to WNL to increase ability to fix hair with less difficulty.    Time 6   Period Weeks   Status On-going   OT LONG TERM GOAL #3   Title Patient will decrease pain level to 2/10 or less when using LUE during daily tasks.    Time 6   Period Weeks   Status On-going   OT LONG TERM GOAL #4   Title Patient will increase LUE strength to 4+/5 to return to normal household chores with less difficulty.    Time 6   Period Weeks   Status On-going   OT LONG TERM GOAL #5   Title Patient will decrease fascial restrictions to trace amount or less in order to increase functional mobility of LUE.   Time 6   Period Weeks   Status On-going               Plan - 03/24/16 1018    Clinical Impression Statement A: Session terminated early due to patient's request (kidney stone pain). Session focused on myofascial release and supine exercises. No VC for form or technique required. Repetitions increased to 15 this session.   Plan P: Progress to 2# supine if able to tolerate. Resume theraband exercises.       Patient will benefit from skilled therapeutic intervention in order to improve the following deficits and impairments:  Decreased strength, Pain, Impaired UE functional use, Increased fascial restricitons, Decreased range of motion  Visit Diagnosis: Other symptoms and signs involving the musculoskeletal system  Stiffness of left shoulder, not elsewhere classified    Problem List Patient Active Problem List   Diagnosis Date Noted  . Dyspnea 11/02/2014  . Upper airway cough syndrome 11/02/2014  . Gout 06/28/2014  . Vitamin B12 deficiency 06/28/2014  . Multiple pulmonary nodules 06/28/2014  . Essential hypertension 05/17/2014  . Obesity (BMI 30-39.9) 01/14/2014  . Lateral epicondylitis (tennis elbow) 01/02/2014  . GERD (gastroesophageal reflux disease)  03/26/2013  . LEG CRAMPS 05/16/2009  . RESTLESS LEG SYNDROME, SEVERE 03/14/2009  . Chronic interstitial cystitis 01/20/2009  . DEPRESSION 03/19/2008  . TUBULOVILLOUS ADENOMA, COLON 02/17/2008  . Hyperlipidemia 02/17/2008  . NEPHROLITHIASIS 02/17/2008  . Former smoker 12/21/2007  . MIGRAINE, CLASSICAL W/O INTRACTABLE MIGRAINE 06/09/2007  . SYMPTOM, MEMORY LOSS 06/09/2007  .  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 FIBROMYALGIA 04/21/2007    Ailene Ravel, OTR/L,CBIS  [phone]  03/24/2016, 10:20 AM  [facility] [address] [place], [place], [address] Phone: [phone]   Fax:  [phone]  Name: Alison Brooks MRN: [id number] Date of Birth: [birth year]

## 2016-03-25 DIAGNOSIS — N201 Calculus of ureter: Secondary | ICD-10-CM | POA: Diagnosis not present

## 2016-03-25 DIAGNOSIS — R310 Gross hematuria: Secondary | ICD-10-CM | POA: Diagnosis not present

## 2016-03-26 ENCOUNTER — Ambulatory Visit (HOSPITAL_COMMUNITY): Payer: Medicare Other | Attending: Orthopedic Surgery

## 2016-03-26 ENCOUNTER — Encounter (HOSPITAL_COMMUNITY): Payer: Self-pay

## 2016-03-26 DIAGNOSIS — R29898 Other symptoms and signs involving the musculoskeletal system: Secondary | ICD-10-CM | POA: Diagnosis not present

## 2016-03-26 DIAGNOSIS — M25512 Pain in left shoulder: Secondary | ICD-10-CM | POA: Diagnosis not present

## 2016-03-26 DIAGNOSIS — M25612 Stiffness of left shoulder, not elsewhere classified: Secondary | ICD-10-CM | POA: Diagnosis not present

## 2016-03-26 NOTE — Therapy (Signed)
[place], [place], [address] Phone: [phone]   Fax:  [phone]  Occupational Therapy Treatment  Patient Details  Name: Alison Brooks MRN: [id number] Date of Birth: 1956-08-20 Referring Provider: Edmonia Lynch, MD  Encounter Date: 03/26/2016      OT End of Session - 03/26/16 1158    Visit Number 12   Number of Visits 20   Date for OT Re-Evaluation 04/23/16   Authorization Type [employer] Medicare HMO - $40.00 copay   Authorization Time Period before 19th visit   Authorization - Visit Number 12   Authorization - Number of Visits 19   OT Start Time 1129  Pt arrived late   OT Stop Time 1202   OT Time Calculation (min) 33 min   Activity Tolerance Patient tolerated treatment well   Behavior During Therapy [facility] for tasks assessed/performed      Past Medical History  Diagnosis Date  . Fibromyalgia   . Chronic back pain   . Migraine   . Depression   . GERD (gastroesophageal reflux disease)   . Squamous cell carcinoma [facility])     Past Surgical History  Procedure Laterality Date  . Lumbar fusion    . Carpal tunnel release      right  . Cholecystectomy  1995  . Abdominal hysterectomy      fibroids, including cervix, left ovaries  . Tonsillectomy  1964  . Back surgery      multiple-see overview for back pain  . Hemorrhoid surgery    . Rotator cuff repair Left 2004  . Cervical fusion  1996  . Biceps tendon repair Left 2017    [facility]- [facility]    There were no vitals filed for this visit.      Subjective Assessment - 03/26/16 1134    Subjective  S: No pain in my shoulder today.   Currently in Pain? No/denies            [facility] OT Assessment - 03/26/16 1134    Assessment   Diagnosis Left bicep tenodesis   Precautions   Precautions None                  OT Treatments/Exercises (OP) - 03/26/16 1134    Exercises   Exercises Shoulder   Shoulder Exercises: Supine   Protraction PROM;5 reps;Strengthening;12 reps   Protraction Weight (lbs) 2   Horizontal ABduction PROM;5 reps;Strengthening;12 reps   Horizontal ABduction Weight (lbs) 2   External Rotation PROM;5 reps;Strengthening;12 reps  shoulder abducted   External Rotation Weight (lbs) 2   Internal Rotation PROM;5 reps;Strengthening;12 reps  shoulder abducted   Internal Rotation Weight (lbs) 2   Flexion PROM;5 reps;Strengthening;12 reps   Shoulder Flexion Weight (lbs) 2   ABduction PROM;5 reps;Strengthening;12 reps   Shoulder ABduction Weight (lbs) 2   Shoulder Exercises: Seated   Protraction Strengthening;12 reps   Protraction Weight (lbs) 2   Horizontal ABduction Strengthening;12 reps   Horizontal ABduction Weight (lbs) 2   External Rotation Strengthening;12 reps   External Rotation Weight (lbs) 2   Internal Rotation Strengthening;12 reps   Internal Rotation Weight (lbs) 2   Flexion Strengthening;12 reps   Flexion Weight (lbs) 2   Abduction Strengthening;12 reps   ABduction Weight (lbs) 2   Shoulder Exercises: Standing   Extension Theraband;12 reps   Theraband Level (Shoulder Extension) Level 3 (Green)   Row [employer] reps   Theraband Level (Shoulder Row) Level 3 (Green)  Retraction Theraband;12 reps   Theraband Level (Shoulder Retraction) Level 3 (Green)   Shoulder Exercises: ROM/Strengthening   UBE (Upper Arm Bike) Level 1 3 minutes forwards 3 minutes reverse   Cybex Press 1.5 plate  579FGE   Cybex Row 1.5 plate  579FGE   "W" Arms 12X with 2# weight   X to V Arms 12X with 2# weight   Proximal Shoulder Strengthening, Supine 12X with 2# no rest breaks   Proximal Shoulder Strengthening, Seated 12X each 2# weight no rest break   Ball on Wall 1' flexion 1' abduction green ball                  OT Short Term Goals - 03/24/16 1019    OT SHORT TERM GOAL #1   Title Patient will be educated and independent with HEP to increase functional performance when using LUE.    Time 3    Period Weeks   OT SHORT TERM GOAL #2   Title Patient will increase P/ROM to WNL of LUE to increase ability to get shirts on and off with less difficulty.    Time 3   Period Weeks   OT SHORT TERM GOAL #3   Title Patient will increase LUE strength to 3+/5 to increase ability to complete activities at shoulder level.    Time 3   Period Weeks   OT SHORT TERM GOAL #4   Title Patient will decrease pain to 3/10 when completing daily tasks with LUE.   Time 3   Period Weeks   Status On-going           OT Long Term Goals - 03/19/16 1009    OT LONG TERM GOAL #1   Title Patient will return to highest level of independence with all daily tasks using LUE.    Time 6   Period Weeks   Status On-going   OT LONG TERM GOAL #2   Title Patient will increase A/ROM of LUE to WNL to increase ability to fix hair with less difficulty.    Time 6   Period Weeks   Status On-going   OT LONG TERM GOAL #3   Title Patient will decrease pain level to 2/10 or less when using LUE during daily tasks.    Time 6   Period Weeks   Status On-going   OT LONG TERM GOAL #4   Title Patient will increase LUE strength to 4+/5 to return to normal household chores with less difficulty.    Time 6   Period Weeks   Status On-going   OT LONG TERM GOAL #5   Title Patient will decrease fascial restrictions to trace amount or less in order to increase functional mobility of LUE.   Time 6   Period Weeks   Status On-going               Plan - 03/26/16 1159    Clinical Impression Statement A: No manual therapy today due to zero pain and arriving late. Progressed to 2# weight for strengthening supine and standing. VC for form and technique with handweights and theraband. Added Cybex row and press.    Plan P: Continue with strengthening. Add arms on fire.       Patient will benefit from skilled therapeutic intervention in order to improve the following deficits and impairments:  Decreased strength, Pain, Impaired  UE functional use, Increased fascial restricitons, Decreased range of motion  Visit Diagnosis: Other symptoms and signs involving the musculoskeletal  system  Stiffness of left shoulder, not elsewhere classified    Problem List Patient Active Problem List   Diagnosis Date Noted  . Dyspnea 11/02/2014  . Upper airway cough syndrome 11/02/2014  . Gout 06/28/2014  . Vitamin B12 deficiency 06/28/2014  . Multiple pulmonary nodules 06/28/2014  . Essential hypertension 05/17/2014  . Obesity (BMI 30-39.9) 01/14/2014  . Lateral epicondylitis (tennis elbow) 01/02/2014  . GERD (gastroesophageal reflux disease) 03/26/2013  . LEG CRAMPS 05/16/2009  . RESTLESS LEG SYNDROME, SEVERE 03/14/2009  . Chronic interstitial cystitis 01/20/2009  . DEPRESSION 03/19/2008  . TUBULOVILLOUS ADENOMA, COLON 02/17/2008  . Hyperlipidemia 02/17/2008  . NEPHROLITHIASIS 02/17/2008  . Former smoker 12/21/2007  . MIGRAINE, CLASSICAL W/O INTRACTABLE MIGRAINE 06/09/2007  . SYMPTOM, MEMORY LOSS 06/09/2007  . 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 FIBROMYALGIA 04/21/2007    Jiovanna Frei, Clarene Duke 03/26/2016, 12:01 PM  [facility] [address] [place], [place], [address] Phone: [phone]   Fax:  [phone]  Name: Alison Brooks MRN: [id number] Date of Birth: 10-Feb-1956

## 2016-03-29 ENCOUNTER — Encounter (HOSPITAL_BASED_OUTPATIENT_CLINIC_OR_DEPARTMENT_OTHER): Payer: Self-pay | Admitting: *Deleted

## 2016-03-29 ENCOUNTER — Other Ambulatory Visit: Payer: Self-pay | Admitting: Urology

## 2016-03-30 ENCOUNTER — Encounter (HOSPITAL_BASED_OUTPATIENT_CLINIC_OR_DEPARTMENT_OTHER): Payer: Self-pay | Admitting: *Deleted

## 2016-03-30 ENCOUNTER — Other Ambulatory Visit: Payer: Self-pay | Admitting: Family Medicine

## 2016-03-30 NOTE — Progress Notes (Signed)
NPO AFTER MN.  ARRIVE AT 1000.  NEEDS ISTAT, EKG , AND KUB.  WILL TAKE AM MEDS DOS W/ SIPS OF WATER.

## 2016-03-31 ENCOUNTER — Encounter (HOSPITAL_COMMUNITY): Payer: Medicare Other

## 2016-04-02 ENCOUNTER — Encounter (HOSPITAL_COMMUNITY): Payer: Medicare Other | Admitting: Occupational Therapy

## 2016-04-02 ENCOUNTER — Encounter (HOSPITAL_BASED_OUTPATIENT_CLINIC_OR_DEPARTMENT_OTHER): Payer: Self-pay | Admitting: *Deleted

## 2016-04-02 ENCOUNTER — Ambulatory Visit (HOSPITAL_COMMUNITY): Payer: Medicare Other

## 2016-04-02 ENCOUNTER — Ambulatory Visit (HOSPITAL_BASED_OUTPATIENT_CLINIC_OR_DEPARTMENT_OTHER): Payer: Medicare Other | Admitting: Anesthesiology

## 2016-04-02 ENCOUNTER — Ambulatory Visit (HOSPITAL_BASED_OUTPATIENT_CLINIC_OR_DEPARTMENT_OTHER)
Admission: RE | Admit: 2016-04-02 | Discharge: 2016-04-02 | Disposition: A | Discharge status: Home / Self Care | Payer: Medicare Other | Source: Ambulatory Visit | Attending: Urology | Admitting: Urology

## 2016-04-02 ENCOUNTER — Encounter (HOSPITAL_BASED_OUTPATIENT_CLINIC_OR_DEPARTMENT_OTHER)
Admission: RE | Disposition: A | Discharge status: Home / Self Care | Payer: Self-pay | Source: Ambulatory Visit | Attending: Urology

## 2016-04-02 DIAGNOSIS — Z79891 Long term (current) use of opiate analgesic: Secondary | ICD-10-CM | POA: Insufficient documentation

## 2016-04-02 DIAGNOSIS — Z79899 Other long term (current) drug therapy: Secondary | ICD-10-CM | POA: Insufficient documentation

## 2016-04-02 DIAGNOSIS — N201 Calculus of ureter: Secondary | ICD-10-CM | POA: Diagnosis not present

## 2016-04-02 DIAGNOSIS — Z841 Family history of disorders of kidney and ureter: Secondary | ICD-10-CM | POA: Diagnosis not present

## 2016-04-02 DIAGNOSIS — K219 Gastro-esophageal reflux disease without esophagitis: Secondary | ICD-10-CM | POA: Insufficient documentation

## 2016-04-02 DIAGNOSIS — I10 Essential (primary) hypertension: Secondary | ICD-10-CM | POA: Diagnosis not present

## 2016-04-02 DIAGNOSIS — R109 Unspecified abdominal pain: Secondary | ICD-10-CM | POA: Diagnosis not present

## 2016-04-02 DIAGNOSIS — Z87891 Personal history of nicotine dependence: Secondary | ICD-10-CM | POA: Diagnosis not present

## 2016-04-02 DIAGNOSIS — E7800 Pure hypercholesterolemia, unspecified: Secondary | ICD-10-CM | POA: Diagnosis not present

## 2016-04-02 DIAGNOSIS — M1990 Unspecified osteoarthritis, unspecified site: Secondary | ICD-10-CM | POA: Diagnosis not present

## 2016-04-02 DIAGNOSIS — E669 Obesity, unspecified: Secondary | ICD-10-CM | POA: Insufficient documentation

## 2016-04-02 DIAGNOSIS — J449 Chronic obstructive pulmonary disease, unspecified: Secondary | ICD-10-CM | POA: Diagnosis not present

## 2016-04-02 DIAGNOSIS — Z6829 Body mass index (BMI) 29.0-29.9, adult: Secondary | ICD-10-CM | POA: Diagnosis not present

## 2016-04-02 DIAGNOSIS — F329 Major depressive disorder, single episode, unspecified: Secondary | ICD-10-CM | POA: Diagnosis not present

## 2016-04-02 HISTORY — DX: Other specified postprocedural states: Z85.9

## 2016-04-02 HISTORY — DX: Unspecified osteoarthritis, unspecified site: M19.90

## 2016-04-02 HISTORY — DX: Calculus of ureter: N20.1

## 2016-04-02 HISTORY — PX: STONE EXTRACTION WITH BASKET: SHX5318

## 2016-04-02 HISTORY — DX: Hyperlipidemia, unspecified: E78.5

## 2016-04-02 HISTORY — PX: CYSTOSCOPY W/ URETERAL STENT PLACEMENT: SHX1429

## 2016-04-02 HISTORY — DX: Restless legs syndrome: G25.81

## 2016-04-02 HISTORY — DX: Interstitial cystitis (chronic) without hematuria: N30.10

## 2016-04-02 HISTORY — DX: Personal history of other specified conditions: Z87.898

## 2016-04-02 HISTORY — DX: Personal history of urinary calculi: Z87.442

## 2016-04-02 HISTORY — DX: Chronic obstructive pulmonary disease, unspecified: J44.9

## 2016-04-02 HISTORY — DX: Other specified postprocedural states: Z98.890

## 2016-04-02 HISTORY — PX: HOLMIUM LASER APPLICATION: SHX5852

## 2016-04-02 HISTORY — DX: Hematuria, unspecified: R31.9

## 2016-04-02 HISTORY — DX: Other nonspecific abnormal finding of lung field: R91.8

## 2016-04-02 HISTORY — DX: Essential (primary) hypertension: I10

## 2016-04-02 LAB — POCT I-STAT 4, (NA,K, GLUC, HGB,HCT)
Glucose, Bld: 90 mg/dL (ref 65–99)
HCT: 42 % (ref 36.0–46.0)
Hemoglobin: 14.3 g/dL (ref 12.0–15.0)
Potassium: 4.1 mmol/L (ref 3.5–5.1)
Sodium: 141 mmol/L (ref 135–145)

## 2016-04-02 SURGERY — CYSTOSCOPY, WITH RETROGRADE PYELOGRAM AND URETERAL STENT INSERTION
Anesthesia: General | Site: Renal | Laterality: Right

## 2016-04-02 MED ORDER — METOCLOPRAMIDE HCL 5 MG/ML IJ SOLN
INTRAMUSCULAR | Status: AC
  Filled 2016-04-02: qty 2

## 2016-04-02 MED ORDER — DEXAMETHASONE SODIUM PHOSPHATE 10 MG/ML IJ SOLN
INTRAMUSCULAR | Status: AC
  Filled 2016-04-02: qty 1

## 2016-04-02 MED ORDER — DEXAMETHASONE SODIUM PHOSPHATE 4 MG/ML IJ SOLN
INTRAMUSCULAR | Status: DC | PRN
  Administered 2016-04-02: 10 mg via INTRAVENOUS

## 2016-04-02 MED ORDER — KETOROLAC TROMETHAMINE 30 MG/ML IJ SOLN
INTRAMUSCULAR | Status: AC
  Filled 2016-04-02: qty 1

## 2016-04-02 MED ORDER — DIATRIZOATE MEGLUMINE 30 % UR SOLN
URETHRAL | Status: DC | PRN
  Administered 2016-04-02: 8 mL via URETHRAL

## 2016-04-02 MED ORDER — SODIUM CHLORIDE 0.9 % IR SOLN
Status: DC | PRN
  Administered 2016-04-02: 4000 mL

## 2016-04-02 MED ORDER — ROCURONIUM BROMIDE 100 MG/10ML IV SOLN
INTRAVENOUS | Status: DC | PRN
  Administered 2016-04-02: 15 mg via INTRAVENOUS

## 2016-04-02 MED ORDER — OXYBUTYNIN CHLORIDE 5 MG PO TABS
5.0000 mg | ORAL_TABLET | Freq: Once | ORAL | Status: AC
  Administered 2016-04-02: 5 mg via ORAL
  Filled 2016-04-02: qty 1

## 2016-04-02 MED ORDER — PHENAZOPYRIDINE HCL 200 MG PO TABS: 200.0000 mg | ORAL_TABLET | Freq: Three times a day (TID) | ORAL | Status: DC | PRN

## 2016-04-02 MED ORDER — MIDAZOLAM HCL 5 MG/5ML IJ SOLN
INTRAMUSCULAR | Status: DC | PRN
  Administered 2016-04-02: 2 mg via INTRAVENOUS

## 2016-04-02 MED ORDER — FENTANYL CITRATE (PF) 100 MCG/2ML IJ SOLN
INTRAMUSCULAR | Status: DC | PRN
  Administered 2016-04-02 (×2): 25 ug via INTRAVENOUS
  Administered 2016-04-02: 50 ug via INTRAVENOUS

## 2016-04-02 MED ORDER — PHENAZOPYRIDINE HCL 100 MG PO TABS
ORAL_TABLET | ORAL | Status: AC
  Filled 2016-04-02: qty 2

## 2016-04-02 MED ORDER — FENTANYL CITRATE (PF) 100 MCG/2ML IJ SOLN
INTRAMUSCULAR | Status: AC
  Filled 2016-04-02: qty 2

## 2016-04-02 MED ORDER — LACTATED RINGERS IV SOLN
INTRAVENOUS | Status: DC
  Administered 2016-04-02 (×2): via INTRAVENOUS
  Filled 2016-04-02: qty 1000

## 2016-04-02 MED ORDER — PROPOFOL 10 MG/ML IV BOLUS
INTRAVENOUS | Status: AC
  Filled 2016-04-02: qty 20

## 2016-04-02 MED ORDER — METOCLOPRAMIDE HCL 5 MG/ML IJ SOLN
5.0000 mg | Freq: Once | INTRAMUSCULAR | Status: AC
  Administered 2016-04-02: 5 mg via INTRAVENOUS
  Filled 2016-04-02: qty 1

## 2016-04-02 MED ORDER — HYDROMORPHONE HCL 4 MG PO TABS: 4.0000 mg | ORAL_TABLET | ORAL | Status: DC | PRN

## 2016-04-02 MED ORDER — KETOROLAC TROMETHAMINE 30 MG/ML IJ SOLN
INTRAMUSCULAR | Status: DC | PRN
  Administered 2016-04-02: 30 mg via INTRAVENOUS

## 2016-04-02 MED ORDER — MIDAZOLAM HCL 2 MG/2ML IJ SOLN
INTRAMUSCULAR | Status: AC
  Filled 2016-04-02: qty 2

## 2016-04-02 MED ORDER — FENTANYL CITRATE (PF) 100 MCG/2ML IJ SOLN
25.0000 ug | INTRAMUSCULAR | Status: DC | PRN
  Administered 2016-04-02 (×2): 25 ug via INTRAVENOUS
  Administered 2016-04-02: 50 ug via INTRAVENOUS
  Filled 2016-04-02: qty 1

## 2016-04-02 MED ORDER — GLYCOPYRROLATE 0.2 MG/ML IJ SOLN
INTRAMUSCULAR | Status: DC | PRN
  Administered 2016-04-02: 0.4 mg via INTRAVENOUS

## 2016-04-02 MED ORDER — PROMETHAZINE HCL 25 MG/ML IJ SOLN
6.2500 mg | INTRAMUSCULAR | Status: DC | PRN
  Filled 2016-04-02: qty 1

## 2016-04-02 MED ORDER — NEOSTIGMINE METHYLSULFATE 10 MG/10ML IV SOLN
INTRAVENOUS | Status: DC | PRN
  Administered 2016-04-02: 3 mg via INTRAVENOUS

## 2016-04-02 MED ORDER — EPHEDRINE SULFATE 50 MG/ML IJ SOLN
INTRAMUSCULAR | Status: DC | PRN
  Administered 2016-04-02: 10 mg via INTRAVENOUS
  Administered 2016-04-02: 5 mg via INTRAVENOUS

## 2016-04-02 MED ORDER — OXYBUTYNIN CHLORIDE 5 MG PO TABS
ORAL_TABLET | ORAL | Status: AC
  Filled 2016-04-02: qty 1

## 2016-04-02 MED ORDER — LIDOCAINE HCL (CARDIAC) 20 MG/ML IV SOLN
INTRAVENOUS | Status: AC
Start: 2016-04-02 — End: 2016-04-02
  Filled 2016-04-02: qty 5

## 2016-04-02 MED ORDER — FAMOTIDINE IN NACL 20-0.9 MG/50ML-% IV SOLN
20.0000 mg | Freq: Once | INTRAVENOUS | Status: AC
  Administered 2016-04-02: 20 mg via INTRAVENOUS
  Filled 2016-04-02: qty 50

## 2016-04-02 MED ORDER — EPHEDRINE SULFATE 50 MG/ML IJ SOLN
INTRAMUSCULAR | Status: AC
  Filled 2016-04-02: qty 1

## 2016-04-02 MED ORDER — CIPROFLOXACIN IN D5W 200 MG/100ML IV SOLN
INTRAVENOUS | Status: AC
Start: 2016-04-02 — End: 2016-04-02
  Filled 2016-04-02: qty 100

## 2016-04-02 MED ORDER — TAMSULOSIN HCL 0.4 MG PO CAPS
ORAL_CAPSULE | ORAL | Status: AC
  Filled 2016-04-02: qty 1

## 2016-04-02 MED ORDER — NEOSTIGMINE METHYLSULFATE 10 MG/10ML IV SOLN
INTRAVENOUS | Status: AC
  Filled 2016-04-02: qty 1

## 2016-04-02 MED ORDER — FAMOTIDINE IN NACL 20-0.9 MG/50ML-% IV SOLN
INTRAVENOUS | Status: AC
  Filled 2016-04-02: qty 50

## 2016-04-02 MED ORDER — LIDOCAINE HCL (CARDIAC) 20 MG/ML IV SOLN
INTRAVENOUS | Status: DC | PRN
  Administered 2016-04-02: 60 mg via INTRAVENOUS

## 2016-04-02 MED ORDER — PROPOFOL 10 MG/ML IV BOLUS
INTRAVENOUS | Status: DC | PRN
  Administered 2016-04-02: 170 mg via INTRAVENOUS
  Administered 2016-04-02: 30 mg via INTRAVENOUS

## 2016-04-02 MED ORDER — TAMSULOSIN HCL 0.4 MG PO CAPS
0.4000 mg | ORAL_CAPSULE | Freq: Once | ORAL | Status: AC
  Administered 2016-04-02: 0.4 mg via ORAL
  Filled 2016-04-02: qty 1

## 2016-04-02 MED ORDER — CIPROFLOXACIN IN D5W 200 MG/100ML IV SOLN
200.0000 mg | INTRAVENOUS | Status: AC
  Administered 2016-04-02: 200 mg via INTRAVENOUS
  Filled 2016-04-02: qty 100

## 2016-04-02 MED ORDER — GLYCOPYRROLATE 0.2 MG/ML IJ SOLN
INTRAMUSCULAR | Status: AC
  Filled 2016-04-02: qty 2

## 2016-04-02 MED ORDER — SUCCINYLCHOLINE CHLORIDE 20 MG/ML IJ SOLN
INTRAMUSCULAR | Status: DC | PRN
  Administered 2016-04-02: 100 mg via INTRAVENOUS

## 2016-04-02 MED ORDER — PHENAZOPYRIDINE HCL 200 MG PO TABS
200.0000 mg | ORAL_TABLET | Freq: Once | ORAL | Status: AC
  Administered 2016-04-02: 200 mg via ORAL
  Filled 2016-04-02: qty 1

## 2016-04-02 SURGICAL SUPPLY — 44 items
ADAPTER CATH URET PLST 4-6FR (CATHETERS) IMPLANT
BAG DRAIN URO-CYSTO SKYTR STRL (DRAIN) ×3 IMPLANT
BASKET LASER NITINOL 1.9FR (BASKET) IMPLANT
BASKET STNLS GEMINI 4WIRE 3FR (BASKET) IMPLANT
BASKET ZERO TIP NITINOL 2.4FR (BASKET) IMPLANT
CANISTER SUCT LVC 12 LTR MEDI- (MISCELLANEOUS) IMPLANT
CATH INTERMIT  6FR 70CM (CATHETERS) IMPLANT
CATH URET 5FR 28IN CONE TIP (BALLOONS)
CATH URET 5FR 70CM CONE TIP (BALLOONS) IMPLANT
CATH URET DUAL LUMEN 6-10FR 50 (CATHETERS) ×3 IMPLANT
CLOTH BEACON ORANGE TIMEOUT ST (SAFETY) ×3 IMPLANT
ELECT REM PT RETURN 9FT ADLT (ELECTROSURGICAL)
ELECTRODE REM PT RTRN 9FT ADLT (ELECTROSURGICAL) IMPLANT
FIBER LASER FLEXIVA 365 (UROLOGICAL SUPPLIES) IMPLANT
FIBER LASER FLEXIVA 550 (UROLOGICAL SUPPLIES) IMPLANT
FIBER LASER TRAC TIP (UROLOGICAL SUPPLIES) ×3 IMPLANT
GLOVE BIO SURGEON STRL SZ8 (GLOVE) ×3 IMPLANT
GLOVE BIOGEL PI IND STRL 6.5 (GLOVE) ×1 IMPLANT
GLOVE BIOGEL PI IND STRL 7.5 (GLOVE) ×2 IMPLANT
GLOVE BIOGEL PI INDICATOR 6.5 (GLOVE) ×2
GLOVE BIOGEL PI INDICATOR 7.5 (GLOVE) ×4
GOWN STRL REUS W/ TWL LRG LVL3 (GOWN DISPOSABLE) ×1 IMPLANT
GOWN STRL REUS W/ TWL XL LVL3 (GOWN DISPOSABLE) ×1 IMPLANT
GOWN STRL REUS W/TWL LRG LVL3 (GOWN DISPOSABLE) ×5 IMPLANT
GOWN STRL REUS W/TWL XL LVL3 (GOWN DISPOSABLE) ×2
GUIDEWIRE 0.038 PTFE COATED (WIRE) ×3 IMPLANT
GUIDEWIRE ANG ZIPWIRE 038X150 (WIRE) IMPLANT
GUIDEWIRE STR DUAL SENSOR (WIRE) ×6 IMPLANT
IV NS 1000ML (IV SOLUTION) ×2
IV NS 1000ML BAXH (IV SOLUTION) ×1 IMPLANT
IV NS IRRIG 3000ML ARTHROMATIC (IV SOLUTION) ×6 IMPLANT
KIT BALLIN UROMAX 15FX10 (LABEL) IMPLANT
KIT BALLN UROMAX 15FX4 (MISCELLANEOUS) IMPLANT
KIT BALLN UROMAX 26 75X4 (MISCELLANEOUS)
KIT ROOM TURNOVER WOR (KITS) ×3 IMPLANT
MANIFOLD NEPTUNE II (INSTRUMENTS) ×3 IMPLANT
NS IRRIG 500ML POUR BTL (IV SOLUTION) IMPLANT
PACK CYSTO (CUSTOM PROCEDURE TRAY) ×3 IMPLANT
SET HIGH PRES BAL DIL (LABEL)
SHEATH ACCESS URETERAL 24CM (SHEATH) ×3 IMPLANT
STENT PERCUFLEX 4.8FRX24 (STENTS) ×3 IMPLANT
TUBE CONNECTING 12'X1/4 (SUCTIONS)
TUBE CONNECTING 12X1/4 (SUCTIONS) IMPLANT
WATER STERILE IRR 3000ML UROMA (IV SOLUTION) ×3 IMPLANT

## 2016-04-02 NOTE — Op Note (Signed)
PATIENT:  Alison Brooks  PRE-OPERATIVE DIAGNOSIS:  right Ureteral calculus  POST-OPERATIVE DIAGNOSIS: Same  PROCEDURE:  1. Cystoscopy with right retrograde pyelogram including interpretation. 2. Right ureteroscopy with laser lithotripsy and stone extraction with stent placement  SURGEON: Claybon Jabs, MD  INDICATION: Alison Brooks is a 60 year old female who has had a right ureteral stone causing intermittent pain and has failed to progress. She is elected to proceed with ureteroscopic management. It was noted on KUB preoperatively.  ANESTHESIA:  General  EBL:  Minimal  DRAINS: 4.8 French, 24 cm double-J stent in the right ureter (with string)  SPECIMEN:  Stone given to patient  DESCRIPTION OF PROCEDURE: The patient was taken to the major OR and placed on the table. General anesthesia was administered and then the patient was moved to the dorsal lithotomy position. The genitalia was sterilely prepped and draped. An official timeout was performed.  Initially the 42 French cystoscope with 30 lens was passed under direct vision into the bladder. The bladder was then fully inspected. It was noted be free of any tumors, stones or inflammatory lesions. Ureteral orifices were of normal configuration and position. A 6 French open-ended ureteral catheter was then passed through the cystoscope into the ureteral orifice in order to perform a right retrograde pyelogram.  A retrograde pyelogram was performed by injecting full-strength contrast up the right ureter under direct fluoroscopic control. It revealed a filling defect in the mid to proximal ureter consistent with the stone seen on the preoperative KUB. The remainder of the ureter was noted to be normal as was the intrarenal collecting system. I then passed a 0.038 inch floppy-tipped guidewire through the open ended catheter and into the area of the renal pelvis and this was left in place. The inner portion of a ureteral access sheath was  then passed over the guidewire to gently dilate the intramural ureter. However I found it somewhat difficult to advance it up the ureter so I removed the inner portion of the access sheath and used a 2 lm ureteral catheter and passed this over the guidewire. I was able to pass this on up the ureter and then passed a second wire to use as a safety guidewire. I then removed the ureteral catheter.   I then proceeded with ureteroscopy initially with the flexible ureteroscope which was passed over the working guidewire but I still could not advance it easily up the ureter so I switched to the 6 French rigid ureteroscope.  A 6 French rigid ureteroscope was then passed under direct into the bladder and into the right orifice and up the ureter. The stone was identified and I felt it was too large to extract and therefore elected to proceed with laser lithotripsy. The 200  holmium laser fiber was used to fragment the stone. I then used the nitinol basket to extract all of the stone fragments and reinspection of the ureter ureteroscopically revealed no further stone fragments and no injury to the ureter. I then backloaded the cystoscope over the guidewire and passed the stent over the guidewire into the area of the renal pelvis. As the guidewire was removed good curl was noted in the renal pelvis. The bladder was drained and the cystoscope was then removed. The patient tolerated the procedure well no intraoperative complications.  PLAN OF CARE: Discharge to home after PACU  PATIENT DISPOSITION:  PACU - hemodynamically stable.

## 2016-04-02 NOTE — Anesthesia Procedure Notes (Signed)
Procedure Name: Intubation Date/Time: 04/02/2016 12:03 PM Performed by: Denna Haggard D Pre-anesthesia Checklist: Patient identified, Emergency Drugs available, Suction available and Patient being monitored Patient Re-evaluated:Patient Re-evaluated prior to inductionOxygen Delivery Method: Circle system utilized Preoxygenation: Pre-oxygenation with 100% oxygen Intubation Type: IV induction Ventilation: Mask ventilation without difficulty Laryngoscope Size: Mac and 3 Grade View: Grade I Tube type: Oral Tube size: 7.0 mm Number of attempts: 1 Airway Equipment and Method: Stylet and Oral airway Placement Confirmation: ETT inserted through vocal cords under direct vision,  positive ETCO2 and breath sounds checked- equal and bilateral Secured at: 22 cm Tube secured with: Tape Dental Injury: Teeth and Oropharynx as per pre-operative assessment

## 2016-04-02 NOTE — Transfer of Care (Signed)
Immediate Anesthesia Transfer of Care Note  Patient: Alison Brooks  Procedure(s) Performed: Procedure(s) (LRB): CYSTOSCOPY WITH RIGHT RETROGRADE, URETEROSCOPY PYELOGRAM LASER LITHOTRIPSY,/URETERAL STENT PLACEMENT (Right) HOLMIUM LASER APPLICATION (Right) STONE EXTRACTION WITH BASKET (Right)  Patient Location: PACU  Anesthesia Type: General  Level of Consciousness: awake, oriented, sedated and patient cooperative  Airway & Oxygen Therapy: Patient Spontanous Breathing and Patient connected to face mask oxygen  Post-op Assessment: Report given to PACU RN and Post -op Vital signs reviewed and stable  Post vital signs: Reviewed and stable  Complications: No apparent anesthesia complications Last Vitals:  Filed Vitals:   04/02/16 1023 04/02/16 1257  BP: 132/77 134/88  Pulse: 57 87  Temp: 37 C 36.5 C  Resp: 14 12

## 2016-04-02 NOTE — Discharge Instructions (Signed)

## 2016-04-02 NOTE — H&P (Signed)
History of Present Illness   Interstitial cystitis: She was diagnosed with ICD by Dr. Minus Liberty in 5/07. At that time she was having significant voiding symptoms and underwent cystoscopy with hydrodistention which revealed a 450 cc capacity bladder under anesthesia. She was experiencing significant urgency and frequency.    Detrusor instability/OAB: She had been experiencing frequency and urgency that were unresponsive to Oxytrol, Ditropan and [place]. Urodynamics in 6/07 revealed a functional cystometric capacity of only 149 cc with significant detrusor instability and associated urge incontinence.   Treatment: 6/07 intravesical Botox.    Calculus disease: She had a left ureteral stone in 12/07 that eventually passed spontaneously.  Stone analysis 12/07: Calcium phosphate and triple phosphate.  She then passed a stone in 12/15 and analysis revealed 80% calcium oxalate and 20% calcium phosphate.    Gross hematuria: She developed gross hematuria in 10/12 and underwent evaluation with a CT scan which revealed bilateral punctate renal calculi and cystoscopy which showed no abnormality.    Bilateral adrenal adenomas: These have been noted on her CT scans and appear unchanged.    Interval history: She developed right flank pain and a CT scan was obtained at any pen on 02/29/16 which revealed a 3 mm stone in the proximal right ureter. No renal calculi were identified. A follow-up CT scan on 03/23/16 revealed the stone had progressed down the right ureter somewhat. She had been placed on medical expulsive therapy. She reports she has had continued intermittent right flank pain that now seems to move around into the abdomen on the right-hand side. She has not seen her stone pass.   Past Medical History Problems  1. History of Anxiety (Symptom) 2. History of Arthritis 3. History of Convulsions (R56.9) 4. History of depression (Z86.59) 5. History of esophageal reflux (Z87.19) 6. History of  hypercholesterolemia (Z86.39) 7. History of Murmur (R01.1)  Surgical History Problems  1. History of Back Surgery 2. History of Cholecystectomy 3. History of Hysterectomy 4. History of Neck Surgery 5. History of Rotator Cuff Repair 6. History of Spine Repair 7. History of Tonsillectomy 8. History of Urologic Surgery  Current Meds 1. Albuterol Sulfate HFA AERS;  Therapy: (Recorded:10May2017) to Recorded 2. Allopurinol 100 MG Oral Tablet;  Therapy: (Recorded:10May2017) to Recorded 3. Atorvastatin Calcium 10 MG Oral Tablet;  Therapy: (Recorded:03Dec2015) to Recorded 4. Baclofen TABS;  Therapy: (Recorded:03Dec2015) to Recorded 5. Bisoprolol Fumarate 5 MG Oral Tablet;  Therapy: (Recorded:10May2017) to Recorded 6. Cyanocobalamin 100 MCG/ML SOLN;  Therapy: (Recorded:10May2017) to Recorded 7. Famotidine 20 MG Oral Tablet;  Therapy: (Recorded:10May2017) to Recorded 8. FLUoxetine HCl - 40 MG Oral Capsule;  Therapy: (Recorded:03Dec2015) to Recorded 9. Ketorolac Tromethamine 10 MG Oral Tablet; Take 1 tablet every 6 hours;  Therapy: [id number] to (Complete:04Jun2017)  Requested for: [id number]; Last  Rx:30May2017 Ordered 10. Ketorolac Tromethamine 30 MG/ML Injection Solution; inject 1 ml; To Be Done:   [id number]; Status: HOLD FOR - Administration Ordered 11. Oxybutynin Chloride 5 MG Oral Tablet; TAKE TABLET  PRN;   Therapy: (Recorded:03Dec2015) to Recorded 12. Oxycodone-Acetaminophen 2.5-325 MG Oral Tablet; TAKE 1 TO 2 TABLETS 4 TIMES   DAILY AS NEEDED FOR PAIN; Last Rx:30May2017 Ordered 13. Pantoprazole Sodium 40 MG Oral Tablet Delayed Release;   Therapy: (Recorded:10May2017) to Recorded 14. Promethazine HCl - 25 MG Oral Tablet; May take 0.5 to 1 tablet Q 6hrs nausea/vomiting;   Therapy: [id number] to (Last [id number])  Requested for: [id number] Ordered 15. Promethazine HCl 25 MG/ML Injection Solution; INJECT 25 MG Intramuscular; To Be   Done:  [id number]; Status: HOLD FOR -  Administration Ordered 16. Tamsulosin HCl - 0.4 MG Oral Capsule;   Therapy: (Recorded:10May2017) to Recorded 17. Topiramate 200 MG Oral Tablet;   Therapy: (Recorded:03Dec2015) to Recorded 18. TraMADol HCl - 50 MG Oral Tablet; TAKE 1 TABLET Every 6 hours PRN;   Therapy: [id number] to (Last Rx:30May2017) Ordered 19. Xenical 120 MG Oral Capsule;   Therapy: (Recorded:10May2017) to Recorded  Allergies Medication  1. Latex Exam Gloves MISC  She is not able to tolerate oxycodone.   Family History Problems  1. Family history of Coronary Artery Disease : Father 2. Family history of Coronary Artery Disease : Mother 3. Family history of Diabetes Mellitus : Father 4. Family history of Family Health Status Number Of Children   1 daughter 5. Family history of Prostate Cancer : Father 78. Family history of Urologic Disorder : Father   kidney stones and blood in urine  Social History Problems  1. Alcohol Use (History)   1-2 per year 2. Caffeine Use   approx. 3 times per week 3. Family history of Death In The Family Mother   age 42, heart attack 4. Former smoker [phone]) 5. Marital History - Currently Married 6. Occupation:   disabled 36. Tobacco Use   4 cigarettes per day for 37 years  Review of Systems A 12 point comprehensive review of systems was obtained and is negative unless otherwise stated in the HPI with the following additions: As above Genitourinary and gastrointestinal system(s) were reviewed and pertinent findings if present are noted and are otherwise negative.  Genitourinary: hematuria.  Gastrointestinal: flank pain and abdominal pain.   Vitals Vital Signs   Height: 5 ft 3 in Weight: 165 lb  BMI Calculated: 29.23 BSA Calculated: 1.78 Blood Pressure: 147 / 84 Temperature: 98 F Heart Rate: 81  Physical Exam Constitutional: Well nourished and well developed . No acute distress.  ENT:. The ears and nose are normal in appearance.  Neck: The appearance of  the neck is normal and no neck mass is present.  Pulmonary: No respiratory distress and normal respiratory rhythm and effort.  Cardiovascular:. No peripheral edema.  Abdomen: The abdomen is soft and nontender.  Skin: Normal skin turgor, no visible rash and no visible skin lesions.  Neuro/Psych:. Mood and affect are appropriate.      Results/Data  The following clinical lab reports were reviewed:  UA: She had no significant red or white cells noted today.    Assessment  We discussed the management of urinary stones. These options include observation, ureteroscopy, shockwave lithotripsy, and PCNL. We discussed which options are relevant to these particular stones. We discussed the natural history of stones as well as the complications of untreated stones and the impact on quality of life without treatment as well as with each of the above listed treatments. We also discussed the efficacy of each treatment in its ability to clear the stone burden. With any of these management options I discussed the signs and symptoms of infection and the need for emergent treatment should these be experienced. For each option we discussed the ability of each procedure to clear the patient of their stone burden.    For observation I described the risks which include but are not limited to silent renal damage, life-threatening infection, need for emergent surgery, failure to pass stone, and pain.    For ureteroscopy I described the risks which include heart attack, stroke, pulmonary embolus, death, bleeding, infection, damage to contiguous structures, positioning injury, ureteral stricture,  ureteral avulsion, ureteral injury, need for ureteral stent, inability to perform ureteroscopy, need for an interval procedure, inability to clear stone burden, stent discomfort and pain.    Since her stone is not visible on KUB easily and cannot be treated with lithotripsy we therefore discussed treatment with ureteroscopy  as she continues to have intermittent pain and is interested in proceeding with surgical therapy.   Plan  1. Prescription for hydromorphone.  2. She'll be scheduled for right ureteroscopy and laser lithotripsy of her right ureteral stone.

## 2016-04-02 NOTE — Anesthesia Preprocedure Evaluation (Addendum)
Anesthesia Evaluation  Patient identified by MRN, date of birth, ID band Patient awake    Reviewed: Allergy & Precautions, NPO status , Patient's Chart, lab work & pertinent test results, reviewed documented beta blocker date and time   History of Anesthesia Complications (+) history of anesthetic complications (post-op seizure)  Airway Mallampati: II  TM Distance: >3 FB Neck ROM: Full    Dental  (+) Teeth Intact, Dental Advisory Given, Missing   Pulmonary COPD, former smoker,    Pulmonary exam normal breath sounds clear to auscultation       Cardiovascular hypertension (beta-blocker yesterday evening), Pt. on medications and Pt. on home beta blockers (-) angina(-) Past MI Normal cardiovascular exam Rhythm:Regular Rate:Normal     Neuro/Psych  Headaches, Seizures -,  PSYCHIATRIC DISORDERS Depression    GI/Hepatic Neg liver ROS, GERD  Medicated and Poorly Controlled,  Endo/Other  Obesity   Renal/GU Right ureteral stone   Interstitial cystitis     Musculoskeletal  (+) Arthritis , Osteoarthritis,  Fibromyalgia -, narcotic dependent  Abdominal   Peds  Hematology negative hematology ROS (+)   Anesthesia Other Findings Day of surgery medications reviewed with the patient.  Reproductive/Obstetrics                            Anesthesia Physical Anesthesia Plan  ASA: III  Anesthesia Plan: General   Post-op Pain Management:    Induction: Intravenous  Airway Management Planned: Oral ETT  Additional Equipment:   Intra-op Plan:   Post-operative Plan: Extubation in OR  Informed Consent: I have reviewed the patients History and Physical, chart, labs and discussed the procedure including the risks, benefits and alternatives for the proposed anesthesia with the patient or authorized representative who has indicated his/her understanding and acceptance.   Dental advisory given  Plan  Discussed with: CRNA  Anesthesia Plan Comments: (Risks/benefits of general anesthesia discussed with patient including risk of damage to teeth, lips, gum, and tongue, nausea/vomiting, allergic reactions to medications, and the possibility of heart attack, stroke and death.  All patient questions answered.  Patient wishes to proceed.)        Anesthesia Quick Evaluation

## 2016-04-02 NOTE — Anesthesia Postprocedure Evaluation (Signed)
Anesthesia Post Note  Patient: Alison Brooks  Procedure(s) Performed: Procedure(s) (LRB): CYSTOSCOPY WITH RIGHT RETROGRADE, URETEROSCOPY PYELOGRAM LASER LITHOTRIPSY,/URETERAL STENT PLACEMENT (Right) HOLMIUM LASER APPLICATION (Right) STONE EXTRACTION WITH BASKET (Right)  Patient location during evaluation: PACU Anesthesia Type: General Level of consciousness: awake Pain management: pain level controlled Vital Signs Assessment: post-procedure vital signs reviewed and stable Respiratory status: spontaneous breathing Cardiovascular status: stable Postop Assessment: no signs of nausea or vomiting Anesthetic complications: no    Last Vitals:  Filed Vitals:   04/02/16 1330 04/02/16 1345  BP: 116/78 124/68  Pulse: 70 77  Temp:    Resp: 12 14    Last Pain:  Filed Vitals:   04/02/16 1352  PainSc: 2                  Corrado Hymon

## 2016-04-05 ENCOUNTER — Encounter (HOSPITAL_BASED_OUTPATIENT_CLINIC_OR_DEPARTMENT_OTHER): Payer: Self-pay | Admitting: Urology

## 2016-04-07 ENCOUNTER — Ambulatory Visit (HOSPITAL_COMMUNITY): Payer: Medicare Other

## 2016-04-09 ENCOUNTER — Encounter (HOSPITAL_COMMUNITY): Payer: Medicare Other

## 2016-04-09 DIAGNOSIS — N201 Calculus of ureter: Secondary | ICD-10-CM | POA: Diagnosis not present

## 2016-04-14 ENCOUNTER — Ambulatory Visit (HOSPITAL_COMMUNITY): Payer: Medicare Other | Admitting: Occupational Therapy

## 2016-04-14 ENCOUNTER — Encounter (HOSPITAL_COMMUNITY): Payer: Self-pay | Admitting: Occupational Therapy

## 2016-04-14 DIAGNOSIS — R29898 Other symptoms and signs involving the musculoskeletal system: Secondary | ICD-10-CM

## 2016-04-14 DIAGNOSIS — M25612 Stiffness of left shoulder, not elsewhere classified: Secondary | ICD-10-CM | POA: Diagnosis not present

## 2016-04-14 DIAGNOSIS — M25512 Pain in left shoulder: Secondary | ICD-10-CM | POA: Diagnosis not present

## 2016-04-14 NOTE — Therapy (Signed)
[place], [place], [address] Phone: [phone]   Fax:  [phone]  Occupational Therapy Reassessment and Discharge Summary  Patient Details  Name: Alison Brooks MRN: [id number] Date of Birth: 07-30-1956 Referring Provider: Edmonia Lynch, MD  Encounter Date: 04/14/2016      OT End of Session - 04/14/16 1136    Visit Number 13   Number of Visits 20   Date for OT Re-Evaluation 04/23/16   Authorization Type [employer] HMO - $40.00 copay   Authorization Time Period before 19th visit   Authorization - Visit Number 13   Authorization - Number of Visits 19   OT Start Time 1105   OT Stop Time 1120   OT Time Calculation (min) 15 min   Activity Tolerance Patient tolerated treatment well   Behavior During Therapy [facility] for tasks assessed/performed      Past Medical History  Diagnosis Date  . Fibromyalgia   . Chronic back pain   . Migraine   . Depression   . GERD (gastroesophageal reflux disease)   . COPD (chronic obstructive pulmonary disease) ([facility])   . Pulmonary nodules     multiple per ct -- monitored by pcp  . RLS (restless legs syndrome)   . IC (interstitial cystitis)   . Right ureteral stone   . Hypertension   . Hyperlipidemia   . History of seizure     x1 2007 post op (per pt neurologist work-up done ?mixture of anesthesia medication and prozac that pt had taken)  and has not any issues since   . Hematuria   . History of kidney stones   . Arthritis     back, neck  . History of squamous cell carcinoma excision     Past Surgical History  Procedure Laterality Date  . Lumbar fusion  1996    L4 --S1  . Carpal tunnel release Right   . Tonsillectomy  1964  . Rotator cuff repair Left 2004  . Cervical fusion  1992  . Hemorrhoid surgery  01-03-2004  . Wrist ganglion excision Right 02-05-2004  . Cysto/  hydrodistention/  instillation therapy  03-04-2006  . Cysto/  botox injection  12-27-2008  &   04-01-2006  . Cholecystectomy  1995  . Spinal cord stimulator implant  x4  last one 2013  . Transthoracic echocardiogram  04-17-2014    EF 60-65%/  trivial AR, MR, and TR  . Cardiovascular stress test  09-02-2004    normal perfusion study/  normal LV function and wall motion, ef 64%  . Shoulder arthroscopy with biceps tendon repair Left 03/ 2017  . Vaginal hysterectomy  1984  . Cystoscopy w/ ureteral stent placement Right 04/02/2016    Procedure: CYSTOSCOPY WITH RIGHT RETROGRADE, URETEROSCOPY PYELOGRAM LASER LITHOTRIPSY,/URETERAL STENT PLACEMENT;  Surgeon: Kathie Rhodes, MD;  Location: [place];  Service: Urology;  Laterality: Right;  . Holmium laser application Right 04/01/1156    Procedure: HOLMIUM LASER APPLICATION;  Surgeon: Kathie Rhodes, MD;  Location: [facility];  Service: Urology;  Laterality: Right;  . Stone extraction with basket Right 04/02/2016    Procedure: STONE EXTRACTION WITH BASKET;  Surgeon: Kathie Rhodes, MD;  Location: [facility];  Service: Urology;  Laterality: Right;    There were no vitals filed for this visit.      Subjective Assessment - 04/14/16 1102    Subjective  S: I'm ready to be finished, my shoulder is feeling great.  Special Tests FOTO score: 73/100 (27% impairment)   Currently in Pain? No/denies           [facility] OT Assessment - 04/14/16 1101    Assessment   Diagnosis Left bicep tenodesis   Precautions   Precautions None   ROM / Strength   AROM / PROM / Strength AROM;PROM;Strength   Palpation   Palpation comment Min fascial restrictions in anterior deltoid/upper arm region.   AROM   Overall AROM Comments Assessed seated. IR/er adducted   AROM Assessment Site Shoulder   Right/Left Shoulder Left   Left Shoulder Flexion 160 Degrees  same as previous   Left Shoulder ABduction 150 Degrees  same as previous   Left Shoulder Internal Rotation 90 Degrees  same as previous   Left Shoulder External  Rotation 67 Degrees  58 previous   PROM   Overall PROM  Within functional limits for tasks performed   Strength   Overall Strength Comments Assessed seated. IR/er adducted. Strength was not assessed at evaluation.    Strength Assessment Site Shoulder   Right/Left Shoulder Left   Left Shoulder Flexion 4+/5  4-/5 previous   Left Shoulder ABduction 4/5  same as previous   Left Shoulder Internal Rotation 4/5  4-/5 previous   Left Shoulder External Rotation 4/5  same as previous                  OT Treatments/Exercises (OP) - 04/14/16 1139    Exercises   Exercises Shoulder   Shoulder Exercises: Seated   Other Seated Exercises Green theraband strengthening exercises: horizontal ab/adduction, ER/IR, PNF patterns.                OT Education - 04/14/16 1112    Education provided Yes   Education Details green theraband strengthening HEP   Person(s) Educated Patient   Methods Explanation;Demonstration;Handout   Comprehension Verbalized understanding;Returned demonstration          OT Short Term Goals - 04/14/16 1109    OT SHORT TERM GOAL #1   Title Patient will be educated and independent with HEP to increase functional performance when using LUE.    Time 3   Period Weeks   OT SHORT TERM GOAL #2   Title Patient will increase P/ROM to WNL of LUE to increase ability to get shirts on and off with less difficulty.    Time 3   Period Weeks   OT SHORT TERM GOAL #3   Title Patient will increase LUE strength to 3+/5 to increase ability to complete activities at shoulder level.    Time 3   Period Weeks   OT SHORT TERM GOAL #4   Title Patient will decrease pain to 3/10 when completing daily tasks with LUE.   Time 3   Period Weeks   Status Achieved           OT Long Term Goals - 04/14/16 1110    OT LONG TERM GOAL #1   Title Patient will return to highest level of independence with all daily tasks using LUE.    Time 6   Period Weeks   Status Achieved    OT LONG TERM GOAL #2   Title Patient will increase A/ROM of LUE to WNL to increase ability to fix hair with less difficulty.    Time 6   Period Weeks   Status Partially Met   OT LONG TERM GOAL #3   Title Patient will decrease pain level to 2/10 or  less when using LUE during daily tasks.    Time 6   Period Weeks   Status Achieved   OT LONG TERM GOAL #4   Title Patient will increase LUE strength to 4+/5 to return to normal household chores with less difficulty.    Time 6   Period Weeks   Status Partially Met   OT LONG TERM GOAL #5   Title Patient will decrease fascial restrictions to trace amount or less in order to increase functional mobility of LUE.   Time 6   Period Weeks   Status Not Met               Plan - 05-05-2016 1136    Clinical Impression Statement A: Reassessment completed this session at pt's request, pt has met 5/5 STGs, 2/5 LTGs, and has partially met an additional 2/5 LTGs. Pt has made great progress during therapy, pt reports she is now able to complete all tasks at her prior level of functioning and independence. She does continue to have difficulty with reaching behind her back into IR, is completing stretches for this motion with HEP. Pt provided with updated green theraband strengthening HEP.    Rehab Potential Excellent   OT Frequency 2x / week   OT Duration 6 weeks   OT Treatment/Interventions Self-care/ADL training;Ultrasound;Passive range of motion;Patient/family education;Cryotherapy;Electrical Stimulation;Moist Heat;Therapeutic activities;Therapeutic exercises;Manual Therapy   Plan P: Discharge pt   OT Home Exercise Plan green theraband strengthening exercises   Consulted and Agree with Plan of Care Patient      Patient will benefit from skilled therapeutic intervention in order to improve the following deficits and impairments:  Decreased strength, Pain, Impaired UE functional use, Increased fascial restricitons, Decreased range of motion  Visit  Diagnosis: Other symptoms and signs involving the musculoskeletal system  Stiffness of left shoulder, not elsewhere classified  Pain in left shoulder      G-Codes - 2016/05/05 1138    Functional Assessment Tool Used FOTO score: 73/100 (27% impaired)   Functional Limitation Carrying, moving and handling objects   Carrying, Moving and Handling Objects Goal Status ([id number]) At least 1 percent but less than 20 percent impaired, limited or restricted   Carrying, Moving and Handling Objects Discharge Status [phone]) At least 20 percent but less than 40 percent impaired, limited or restricted      Problem List Patient Active Problem List   Diagnosis Date Noted  . Dyspnea 11/02/2014  . Upper airway cough syndrome 11/02/2014  . Gout 06/28/2014  . Vitamin B12 deficiency 06/28/2014  . Multiple pulmonary nodules 06/28/2014  . Essential hypertension 05/17/2014  . Obesity (BMI 30-39.9) 01/14/2014  . Lateral epicondylitis (tennis elbow) 01/02/2014  . GERD (gastroesophageal reflux disease) 03/26/2013  . LEG CRAMPS 05/16/2009  . RESTLESS LEG SYNDROME, SEVERE 03/14/2009  . Chronic interstitial cystitis 01/20/2009  . DEPRESSION 03/19/2008  . TUBULOVILLOUS ADENOMA, COLON 02/17/2008  . Hyperlipidemia 02/17/2008  . NEPHROLITHIASIS 02/17/2008  . Former smoker 12/21/2007  . MIGRAINE, CLASSICAL W/O INTRACTABLE MIGRAINE 06/09/2007  . SYMPTOM, MEMORY LOSS 06/09/2007  . 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 FIBROMYALGIA 04/21/2007    Guadelupe Sabin, OTR/L  [phone]  May 05, 2016, 11:40 AM  [place] [address] [place], [place], [address] Phone: [phone]   Fax:  [phone]  Name: Alison Brooks MRN: [id number] Date of Birth: 05-28-1956    OCCUPATIONAL THERAPY DISCHARGE SUMMARY  Visits from Start of Care: 13  Current functional level related to goals / functional outcomes: See above. Pt reports  she is at her prior level of functioning with daily and leisure tasks.    Remaining deficits: Pt continues to have difficulty with reaching behind the back into internal rotation.    Education / Equipment: Provided pt with updated green theraband strengthening exercises, discussed continuing with strengthening HEP and stretches.  Plan: Patient agrees to discharge.  Patient goals were met. Patient is being discharged due to being pleased with the current functional level.  ?????

## 2016-04-14 NOTE — Patient Instructions (Signed)
Strengthening: Chest Pull - Resisted   Hold Theraband in front of body with hands about shoulder width a part. Pull band a part and back together slowly. Repeat __10-15__ times. Complete __1__ set(s) per session.. Repeat _1-2___ session(s) per day.  http://orth.exer.us/926   Copyright  VHI. All rights reserved.   PNF Strengthening: Resisted   Standing with resistive band around each hand, bring right arm up and away, thumb back. Repeat _10-15___ times per set. Do _1___ sets per session. Do __1-2__ sessions per day.                           Resisted External Rotation: in Neutral - Bilateral   Sit or stand, tubing in both hands, elbows at sides, bent to 90, forearms forward. Pinch shoulder blades together and rotate forearms out. Keep elbows at sides. Repeat _10-15___ times per set. Do _1___ sets per session. Do _1-2___ sessions per day.  http://orth.exer.us/966   Copyright  VHI. All rights reserved.   PNF Strengthening: Resisted   Standing, hold resistive band above head. Bring right arm down and out from side. Repeat _10-15___ times per set. Do __1__ sets per session. Do _1-2___ sessions per day.  http://orth.exer.us/922   Copyright  VHI. All rights reserved.  

## 2016-04-16 ENCOUNTER — Encounter (HOSPITAL_COMMUNITY): Payer: Medicare Other | Admitting: Occupational Therapy

## 2016-04-21 ENCOUNTER — Encounter (HOSPITAL_COMMUNITY): Payer: Medicare Other

## 2016-04-23 ENCOUNTER — Encounter (HOSPITAL_COMMUNITY): Payer: Medicare Other

## 2016-04-28 ENCOUNTER — Other Ambulatory Visit: Payer: Self-pay | Admitting: Internal Medicine

## 2016-04-28 DIAGNOSIS — R918 Other nonspecific abnormal finding of lung field: Secondary | ICD-10-CM

## 2016-04-29 DIAGNOSIS — N3941 Urge incontinence: Secondary | ICD-10-CM | POA: Diagnosis not present

## 2016-04-29 DIAGNOSIS — N63 Unspecified lump in breast: Secondary | ICD-10-CM | POA: Diagnosis not present

## 2016-04-29 DIAGNOSIS — R300 Dysuria: Secondary | ICD-10-CM | POA: Diagnosis not present

## 2016-04-29 LAB — HM MAMMOGRAPHY

## 2016-04-30 ENCOUNTER — Encounter: Payer: Self-pay | Admitting: Family Medicine

## 2016-05-12 DIAGNOSIS — Z08 Encounter for follow-up examination after completed treatment for malignant neoplasm: Secondary | ICD-10-CM | POA: Diagnosis not present

## 2016-05-12 DIAGNOSIS — Z1283 Encounter for screening for malignant neoplasm of skin: Secondary | ICD-10-CM | POA: Diagnosis not present

## 2016-05-12 DIAGNOSIS — L570 Actinic keratosis: Secondary | ICD-10-CM | POA: Diagnosis not present

## 2016-05-12 DIAGNOSIS — L820 Inflamed seborrheic keratosis: Secondary | ICD-10-CM | POA: Diagnosis not present

## 2016-05-12 DIAGNOSIS — D225 Melanocytic nevi of trunk: Secondary | ICD-10-CM | POA: Diagnosis not present

## 2016-05-12 DIAGNOSIS — Z85828 Personal history of other malignant neoplasm of skin: Secondary | ICD-10-CM | POA: Diagnosis not present

## 2016-05-14 ENCOUNTER — Ambulatory Visit (INDEPENDENT_AMBULATORY_CARE_PROVIDER_SITE_OTHER)
Admission: RE | Admit: 2016-05-14 | Discharge: 2016-05-14 | Disposition: A | Discharge status: Home / Self Care | Payer: Medicare Other | Source: Ambulatory Visit | Attending: Internal Medicine | Admitting: Internal Medicine

## 2016-05-14 DIAGNOSIS — R918 Other nonspecific abnormal finding of lung field: Secondary | ICD-10-CM

## 2016-05-17 ENCOUNTER — Telehealth: Payer: Self-pay | Admitting: Internal Medicine

## 2016-05-17 NOTE — Progress Notes (Signed)
Called spoke with pt. Reviewed results and recs. Pt voiced understanding and had no further questions.

## 2016-05-17 NOTE — Telephone Encounter (Signed)
[phone] pt calling back

## 2016-05-17 NOTE — Telephone Encounter (Signed)
Notes Recorded by Tanda Rockers, MD on 05/15/2016 at 6:07 AM Call patient : Study is unchanged since 2015 and considered to meet criteria for benignancy (benign) so no more studies needed > copy also to [facility] spoke with pt. Reviewed results and recs. Pt voiced understanding and had no further questions.  Copy sent to PCP. Nothing further needed.

## 2016-05-17 NOTE — Progress Notes (Signed)
lmtcb

## 2016-07-02 ENCOUNTER — Other Ambulatory Visit: Payer: Self-pay | Admitting: Family Medicine

## 2016-07-13 ENCOUNTER — Encounter: Payer: Self-pay | Admitting: Family Medicine

## 2016-07-13 ENCOUNTER — Ambulatory Visit (INDEPENDENT_AMBULATORY_CARE_PROVIDER_SITE_OTHER): Payer: Medicare Other | Admitting: Family Medicine

## 2016-07-13 VITALS — BP 152/92 | HR 91 | Temp 98.4°F | Wt 168.2 lb

## 2016-07-13 DIAGNOSIS — K219 Gastro-esophageal reflux disease without esophagitis: Secondary | ICD-10-CM | POA: Diagnosis not present

## 2016-07-13 DIAGNOSIS — E538 Deficiency of other specified B group vitamins: Secondary | ICD-10-CM | POA: Diagnosis not present

## 2016-07-13 DIAGNOSIS — R1111 Vomiting without nausea: Secondary | ICD-10-CM

## 2016-07-13 DIAGNOSIS — I10 Essential (primary) hypertension: Secondary | ICD-10-CM | POA: Diagnosis not present

## 2016-07-13 MED ORDER — CYANOCOBALAMIN 1000 MCG/ML IJ SOLN
1000.0000 ug | Freq: Once | INTRAMUSCULAR | Status: AC
  Administered 2016-07-13: 1000 ug via INTRAMUSCULAR

## 2016-07-13 MED ORDER — AMLODIPINE BESYLATE 2.5 MG PO TABS: 2.5000 mg | ORAL_TABLET | Freq: Every day | ORAL | 5 refills | Status: DC

## 2016-07-13 NOTE — Progress Notes (Signed)
Pre visit review using our clinic review tool, if applicable. No additional management support is needed unless otherwise documented below in the visit note. 

## 2016-07-13 NOTE — Patient Instructions (Signed)
b12 injection- you need to get these monthly- please schedule today  Stop all meds except protonix 40mg  and pepcid at night  Start amlodipine 2.5mg . Only take circled meds  We will call you within a week about your referral to GI. If you do not hear within 2 weeks, give [place] a call. I am going to ask them to get you in within 2 weeks  Check back in 1-2 weeks from now

## 2016-07-13 NOTE — Progress Notes (Signed)
Subjective:  Alison Brooks is a 60 y.o. year old very pleasant female patient who presents for/with See problem oriented charting ROS- no fever, chills. No chest pain other than burning sensation noted below. No shortness of breath.No facial or extremity weakness. No slurred words or trouble swallowing. no blurry vision or double vision. No paresthesias other than perioral noted below.  .see any ROS included in HPI as well.   Past Medical History-  Patient Active Problem List   Diagnosis Date Noted  . 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 Gout 06/28/2014    Priority: Medium  . Vitamin B12 deficiency 06/28/2014    Priority: Medium  . Multiple pulmonary nodules 06/28/2014    Priority: Medium  . Essential hypertension 05/17/2014    Priority: Medium  . RESTLESS LEG SYNDROME, SEVERE 03/14/2009    Priority: Medium  . Chronic interstitial cystitis 01/20/2009    Priority: Medium  . DEPRESSION 03/19/2008    Priority: Medium  . Hyperlipidemia 02/17/2008    Priority: Medium  . Former smoker 12/21/2007    Priority: Medium  . FIBROMYALGIA 04/21/2007    Priority: Medium  . Obesity (BMI 30-39.9) 01/14/2014    Priority: Low  . Lateral epicondylitis (tennis elbow) 01/02/2014    Priority: Low  . GERD (gastroesophageal reflux disease) 03/26/2013    Priority: Low  . LEG CRAMPS 05/16/2009    Priority: Low  . TUBULOVILLOUS ADENOMA, COLON 02/17/2008    Priority: Low  . NEPHROLITHIASIS 02/17/2008    Priority: Low  . MIGRAINE, CLASSICAL W/O INTRACTABLE MIGRAINE 06/09/2007    Priority: Low  . SYMPTOM, MEMORY LOSS 06/09/2007    Priority: Low  . Dyspnea 11/02/2014  . Upper airway cough syndrome 11/02/2014    Medications- reviewed and updated Current Outpatient Prescriptions  Medication Sig Dispense Refill  . albuterol (PROVENTIL HFA;VENTOLIN HFA) 108 (90 BASE) MCG/ACT inhaler Inhale 2 puffs into the lungs every 6 (six) hours as  needed for wheezing or shortness of breath. 1 Inhaler 0  . allopurinol (ZYLOPRIM) 100 MG tablet Take 1 tablet by mouth  daily 90 tablet 3  . atorvastatin (LIPITOR) 40 MG tablet Take 1 tablet by mouth  daily (Patient taking differently: Take 1 tablet by mouth  daily--  TAKES IN AM) 90 tablet 3  . baclofen (LIORESAL) 10 MG tablet Take 1/2 to 1 tablet by  mouth 3 times daily 180 tablet 3  . cyanocobalamin (,VITAMIN B-12,) 1000 MCG/ML injection Inject 1 mL (1,000 mcg total) into the muscle every 30 (thirty) days. 10 mL 1  . famotidine (PEPCID) 20 MG tablet Take 1 tablet by mouth  every night at bedtime 90 tablet 1  . FLUoxetine (PROZAC) 40 MG capsule Take 1 capsule by mouth  daily (Patient taking differently: Take 1 capsule by mouth  daily-- TAKES IN AM) 90 capsule 3  . oxybutynin (DITROPAN) 5 MG tablet TAKE 1 TABLET BY MOUTH 2  TIMES DAILY AS NEEDED FOR  BLADDER SPASMS. 180 tablet 3  . pantoprazole (PROTONIX) 40 MG tablet TAKE 1 TABLET BY MOUTH  DAILY. TAKE 30-60 MIN  BEFORE FIRST MEAL OF THE  DAY 90 tablet 1  . phenazopyridine (PYRIDIUM) 200 MG tablet Take 1 tablet (200 mg total) by mouth 3 (three) times daily as needed for pain. 30 tablet 0  . promethazine (PHENERGAN) 25 MG tablet Take 25 mg by mouth every 6 (six) hours.     . tamsulosin (FLOMAX) 0.4  MG CAPS capsule Take 1 capsule (0.4 mg total) by mouth daily. Until stone passes (Patient taking differently: Take 0.4 mg by mouth daily after breakfast. Until stone passes) 30 capsule 0  . topiramate (TOPAMAX) 200 MG tablet Take 1 tablet by mouth  daily (Patient taking differently: Take 1 tablet by mouth  daily---  TAKES IN AM) 90 tablet 3  . traMADol (ULTRAM) 50 MG tablet Take 50 mg by mouth every 6 (six) hours as needed.     Marland Kitchen amLODipine (NORVASC) 2.5 MG tablet Take 1 tablet (2.5 mg total) by mouth daily. 30 tablet 5   No current facility-administered medications for this visit.     Objective: BP (!) 152/92 (BP Location: Left Arm, Patient Position:  Sitting, Cuff Size: Normal)   Pulse 91   Temp 98.4 F (36.9 C) (Oral)   Wt 168 lb 3.2 oz (76.3 kg)   SpO2 97%   BMI 31.78 kg/m  Gen: NAD, resting comfortably CV: RRR no murmurs rubs or gallops Lungs: CTAB no crackles, wheeze, rhonchi Abdomen: soft/nontender except mild epigastric discomfort/nondistended/normal bowel sounds. No rebound or guarding.  Ext: no edema Skin: warm, dry, no rash over abdomen Neuro: grossly normal, moves all extremities  Assessment/Plan:  GERD Food/liquid regurgitation/vomiting Hypertension S: Patient states she has had extreme difficulty with her stomach/reflux since last visit. Easy stomach irritation- water, black coffee (little), ginger ale seem to be only liquids she can tolerate. Avoids tea or other drinks including milk- if drinks throws it back up- and it feels like typical burning sensation in her chest before this occurs. Also has  Epigastric burning after eating. Able to eat foods ok but seems to be getting worse. Having to avoid spice and chocolate.   During this time she has also come Off allopurinol, atorvastatin, baclofen, oxybutynin, flomax, topamax, bisoprolol, prozac. These meds either upset above symptoms or cause other atypical symptoms.   The only meds she tolerates and they at least provide short term relief are her protonix and pepcid.   In last few weeks, Took topamax (restless legs) and prozac (depression) an hour after PPI and felt numbness/tingling around face and confusion and lasted and numbness in both fingers, got SOB as well. Stopped taking them and tingling eventually resolved as long as other symptoms. Other symptoms recurred in the same way and has similar reaction when she tried the medication again.   bisoprolol- feels like "legs felt very heavy". BP poorly controlled off meds today. Has not been on lisinopril in someitime  Low b12 and has not been compliant with shots  A/P: Placed urgent referral to GI to try to see if she  can get EGD in near future (hopefully within week or two- perhaps use Dr. Earlean Shawl). Primary concern is for ulcer. Doubt stricture as she complains more about fluids than foods for regurgitated food. Symptoms are rather atypical and reflux worsening should not cause SE from other medicines. BP is poorly controlled off meds so will try amlodipine 2.5mg  as alternate to bisoprolol. Remain off gout and lipid medicine for now. Slowly add back in as able (circled on her sheet to only take amlodipine, protonix, pepcid) She also admits to high level of stress as she is trying to help neighbor who has been almost homeless. Patient with history of chronic pain previously managed on methadone but has been off for over a year now fortunately. She makes no pain request today.   Normal neuro exam today and doubt that numbness episodes periorally represented  stroke. B12 deficiency related? Advise dher to get back on the monthly schedule for b12  1-2 week follow up to recheck BP and check on GI follow up progress. Consider cbc, cmp, lipase at that time though strongly doubt pancreatitis.   Orders Placed This Encounter  Procedures  . Ambulatory referral to Gastroenterology    Referral Priority:   Urgent    Referral Type:   Consultation    Referral Reason:   Specialty Services Required    Number of Visits Requested:   1    Meds ordered this encounter  Medications  . amLODipine (NORVASC) 2.5 MG tablet    Sig: Take 1 tablet (2.5 mg total) by mouth daily.    Dispense:  30 tablet    Refill:  5  . cyanocobalamin ((VITAMIN B-12)) injection 1,000 mcg   The duration of face-to-face time during this visit was greater than 25 minutes. Greater than 50% of this time was spent in counseling about recent stressors as well as stress about current illness and discussion of work up and follow up  Return precautions advised.  Garret Reddish, MD

## 2016-07-21 NOTE — Progress Notes (Signed)
Pt is scheduled for 07/26/2016

## 2016-07-26 ENCOUNTER — Ambulatory Visit (INDEPENDENT_AMBULATORY_CARE_PROVIDER_SITE_OTHER): Payer: Medicare Other | Admitting: Nurse Practitioner

## 2016-07-26 ENCOUNTER — Encounter: Payer: Self-pay | Admitting: Nurse Practitioner

## 2016-07-26 VITALS — BP 122/70 | HR 80 | Ht 62.0 in | Wt 171.0 lb

## 2016-07-26 DIAGNOSIS — K219 Gastro-esophageal reflux disease without esophagitis: Secondary | ICD-10-CM

## 2016-07-26 DIAGNOSIS — R1319 Other dysphagia: Secondary | ICD-10-CM

## 2016-07-26 NOTE — Progress Notes (Signed)
HPI: Alison Brooks is a 60 year old female with a PMH significant for COPD, HTN, pulmonary nodules monitored by Pulmonary, and fibromyalgia. She is known to Dr. Henrene Pastor for history of adenomatous colon polyps and is up to date on surveillance colonoscopy. Alison Brooks is referred by her PCP, Garret Reddish, M.D. for evaluation of GERD.Marland Kitchen Patient has a long-standing history of GERD treated with daily PPI + Pepcid.  Thirty minutes after taking Protonix patient eats breakfast then takes remainder of her home medications. In June she began experiencing heartburn after morning medications.  She tried adjusting timing of some of her medications without improvement. She has now stopped many of her home meds but still having heartburn.  She avoids chocolate, sleeps with head of bed elevated most of the time. No recent weight gain. She can tolerate water and Ginger Ale but regurgitates and "spews up" most other drinks. She sometimes "chokes" on her saliva as well as food. She has had some recent tingling / numbness of mouth under evaluation by PCP. B12 > 1500 in June.     Past Medical History:  Diagnosis Date  . Arthritis    back, neck  . Chronic back pain   . COPD (chronic obstructive pulmonary disease) ([place])   . Depression   . Fibromyalgia   . GERD (gastroesophageal reflux disease)   . Hematuria   . History of kidney stones   . History of seizure    x1 2007 post op (per pt neurologist work-up done ?mixture of anesthesia medication and prozac that pt had taken)  and has not any issues since   . History of squamous cell carcinoma excision   . Hyperlipidemia   . Hypertension   . IC (interstitial cystitis)   . Migraine   . Pulmonary nodules    multiple per ct -- monitored by pcp  . Right ureteral stone   . RLS (restless legs syndrome)     Past Surgical History:  Procedure Laterality Date  . CARDIOVASCULAR STRESS TEST  09-02-2004   normal perfusion study/  normal LV function and wall motion, ef 64%    . CARPAL TUNNEL RELEASE Right   . CERVICAL FUSION  1992  . CHOLECYSTECTOMY  1995  . CYSTO/  BOTOX INJECTION  12-27-2008  &  04-01-2006  . CYSTO/  HYDRODISTENTION/  INSTILLATION THERAPY  03-04-2006  . CYSTOSCOPY W/ URETERAL STENT PLACEMENT Right 04/02/2016   Procedure: CYSTOSCOPY WITH RIGHT RETROGRADE, URETEROSCOPY PYELOGRAM LASER LITHOTRIPSY,/URETERAL STENT PLACEMENT;  Surgeon: Kathie Rhodes, MD;  Location: [facility];  Service: Urology;  Laterality: Right;  . HEMORRHOID SURGERY  01-03-2004  . HOLMIUM LASER APPLICATION Right [id number]   Procedure: HOLMIUM LASER APPLICATION;  Surgeon: Kathie Rhodes, MD;  Location: [facility];  Service: Urology;  Laterality: Right;  . LUMBAR FUSION  1996   L4 --S1  . ROTATOR CUFF REPAIR Left 2004  . SHOULDER ARTHROSCOPY WITH BICEPS TENDON REPAIR Left 03/ 2017  . SPINAL CORD STIMULATOR IMPLANT  x4  last one 2013  . STONE EXTRACTION WITH BASKET Right 04/02/2016   Procedure: STONE EXTRACTION WITH BASKET;  Surgeon: Kathie Rhodes, MD;  Location: [facility];  Service: Urology;  Laterality: Right;  . TONSILLECTOMY  1964  . TRANSTHORACIC ECHOCARDIOGRAM  04-17-2014   EF 60-65%/  trivial AR, MR, and TR  . VAGINAL HYSTERECTOMY  1984  . WRIST GANGLION EXCISION Right 02-05-2004   Family History  Problem Relation Age of Onset  . Heart  attack Mother     Mom 43, Dad 53  . Hypertension Mother   . Heart disease Mother   . Heart disease Father   . Diabetes Father   . Colon cancer Maternal Grandmother     not sure age of onset   Social History  Substance Use Topics  . Smoking status: Former Smoker    Packs/day: 1.25    Years: 40.00    Types: Cigarettes    Quit date: 06/07/2008  . Smokeless tobacco: Never Used  . Alcohol use 0.0 oz/week     Comment: VERY RARE   Current Outpatient Prescriptions  Medication Sig Dispense Refill  . amLODipine (NORVASC) 2.5 MG tablet Take 1 tablet (2.5 mg total) by mouth daily. 30 tablet 5   . pantoprazole (PROTONIX) 40 MG tablet TAKE 1 TABLET BY MOUTH  DAILY. TAKE 30-60 MIN  BEFORE FIRST MEAL OF THE  DAY 90 tablet 1  . albuterol (PROVENTIL HFA;VENTOLIN HFA) 108 (90 BASE) MCG/ACT inhaler Inhale 2 puffs into the lungs every 6 (six) hours as needed for wheezing or shortness of breath. (Patient not taking: Reported on 07/26/2016) 1 Inhaler 0  . allopurinol (ZYLOPRIM) 100 MG tablet Take 1 tablet by mouth  daily (Patient not taking: Reported on 07/26/2016) 90 tablet 3  . atorvastatin (LIPITOR) 40 MG tablet Take 1 tablet by mouth  daily (Patient not taking: Reported on 07/26/2016) 90 tablet 3  . baclofen (LIORESAL) 10 MG tablet Take 1/2 to 1 tablet by  mouth 3 times daily (Patient not taking: Reported on 07/26/2016) 180 tablet 3  . cyanocobalamin (,VITAMIN B-12,) 1000 MCG/ML injection Inject 1 mL (1,000 mcg total) into the muscle every 30 (thirty) days. (Patient not taking: Reported on 07/26/2016) 10 mL 1  . famotidine (PEPCID) 20 MG tablet Take 1 tablet by mouth  every night at bedtime (Patient not taking: Reported on 07/26/2016) 90 tablet 1  . FLUoxetine (PROZAC) 40 MG capsule Take 1 capsule by mouth  daily (Patient not taking: Reported on 07/26/2016) 90 capsule 3  . oxybutynin (DITROPAN) 5 MG tablet TAKE 1 TABLET BY MOUTH 2  TIMES DAILY AS NEEDED FOR  BLADDER SPASMS. (Patient not taking: Reported on 07/26/2016) 180 tablet 3  . phenazopyridine (PYRIDIUM) 200 MG tablet Take 1 tablet (200 mg total) by mouth 3 (three) times daily as needed for pain. (Patient not taking: Reported on 07/26/2016) 30 tablet 0  . promethazine (PHENERGAN) 25 MG tablet Take 25 mg by mouth every 6 (six) hours.     . tamsulosin (FLOMAX) 0.4 MG CAPS capsule Take 1 capsule (0.4 mg total) by mouth daily. Until stone passes (Patient not taking: Reported on 07/26/2016) 30 capsule 0  . topiramate (TOPAMAX) 200 MG tablet Take 1 tablet by mouth  daily (Patient not taking: Reported on 07/26/2016) 90 tablet 3  . traMADol (ULTRAM) 50 MG  tablet Take 50 mg by mouth every 6 (six) hours as needed.      No current facility-administered medications for this visit.    Allergies  Allergen Reactions  . Latex Other (See Comments)    blisters  . Oxycodone Nausea And Vomiting    And sweating  . Zofran [Ondansetron Hcl] Other (See Comments)    headache    Review of Systems: Positive for recent development of tingling / numbness of mouth following some of her home medications. All other systems reviewed and negative except where noted in HPI.   Physical Exam: BP 122/70   Pulse 80   Ht  5\' 2"  (1.575 m)   Wt 171 lb (77.6 kg)   BMI 31.28 kg/m  Constitutional: Pleasant,well-developed, white female in no acute distress. HEENT: Normocephalic and atraumatic. Conjunctivae are normal. No scleral icterus. Neck supple.  Cardiovascular: Normal rate, regular rhythm.  Pulmonary/chest: Effort normal. A few bibasilar crackles.  No wheezing Abdominal: Soft, nondistended, nontender. Bowel sounds active throughout. There are no masses palpable. No hepatomegaly. Extremities: no edema Lymphadenopathy: No cervical adenopathy noted. Neurological: Alert and oriented to person place and time. Skin: Skin is warm and dry. No rashes noted. Psychiatric: Normal mood and affect. Behavior is normal.   ASSESSMENT AND PLAN:  GERD, longstanding history.  Uncontrolled pyrosis since June on home regimen of PPI + Pepcid. She is following anti-reflux measures. Relates uncontrolled heartburn to some of her medications. Though she does take meds which can decrease the LES pressure and worsen GERD ( anticholinergics, calcium channel blockers), symptoms haven't improved off those meds. Patient also describes dysphagia to many liquids, ice cream, some solids and even her own saliva -For further evaluation patient will be scheduled for an upper endoscopy with possible dilation with Dr. Henrene Pastor. We discussed the risk and benefits of the procedure and patient agrees to  proceed.    COPD, stable   [name], MD

## 2016-07-26 NOTE — Patient Instructions (Signed)

## 2016-07-27 NOTE — Progress Notes (Signed)
Workup reviewed. Symptoms a bit atypical. Agree with initial assessment and plans

## 2016-07-28 ENCOUNTER — Ambulatory Visit (AMBULATORY_SURGERY_CENTER): Payer: Medicare Other | Admitting: Internal Medicine

## 2016-07-28 ENCOUNTER — Encounter: Payer: Self-pay | Admitting: Internal Medicine

## 2016-07-28 VITALS — BP 128/68 | HR 60 | Temp 98.4°F | Resp 11 | Ht 62.0 in | Wt 171.0 lb

## 2016-07-28 DIAGNOSIS — K222 Esophageal obstruction: Secondary | ICD-10-CM | POA: Diagnosis not present

## 2016-07-28 DIAGNOSIS — R1319 Other dysphagia: Secondary | ICD-10-CM

## 2016-07-28 DIAGNOSIS — K219 Gastro-esophageal reflux disease without esophagitis: Secondary | ICD-10-CM | POA: Diagnosis present

## 2016-07-28 DIAGNOSIS — R1310 Dysphagia, unspecified: Secondary | ICD-10-CM | POA: Diagnosis not present

## 2016-07-28 NOTE — Progress Notes (Signed)
No problems noted in the recovery room. Maw  Dr. Henrene Pastor wants pt to increase her pantoprazole to twice daily.  Pt said she just her mail order rx rx, so she will take then twice daily.  When it is time to reorder pt said she will call the office and explain this. maw

## 2016-07-28 NOTE — Progress Notes (Signed)
Called to room to assist during endoscopic procedure.  Patient ID and intended procedure confirmed with present staff. Received instructions for my participation in the procedure from the performing physician.  

## 2016-07-28 NOTE — Progress Notes (Signed)
No problems noted in the recovery room. maw 

## 2016-07-28 NOTE — Op Note (Signed)
[place] Patient Name: Alison Brooks Procedure Date: 07/28/2016 3:53 PM MRN: [id number] Endoscopist: Docia Chuck. Henrene Pastor , MD Age: 60 Referring MD:  Date of Birth: Aug 21, 1956 Gender: Female Account #: [id number] Procedure:                Upper GI endoscopy, with Venia Minks dilation-52 [place] Indications:              Dysphagia, Esophageal reflux Medicines:                Monitored Anesthesia Care Procedure:                Pre-Anesthesia Assessment:                           - Prior to the procedure, a History and Physical                            was performed, and patient medications and                            allergies were reviewed. The patient's tolerance of                            previous anesthesia was also reviewed. The risks                            and benefits of the procedure and the sedation                            options and risks were discussed with the patient.                            All questions were answered, and informed consent                            was obtained. Prior Anticoagulants: The patient has                            taken no previous anticoagulant or antiplatelet                            agents. ASA Grade Assessment: II - A patient with                            mild systemic disease. After reviewing the risks                            and benefits, the patient was deemed in                            satisfactory condition to undergo the procedure.                           After obtaining informed consent, the endoscope was  passed under direct vision. Throughout the                            procedure, the patient's blood pressure, pulse, and                            oxygen saturations were monitored continuously. The                            Model GIF-HQ190 [id number]) scope was introduced                            through the mouth, and advanced to the second part          of duodenum. The upper GI endoscopy was                            accomplished without difficulty. The patient                            tolerated the procedure well. Scope In: Scope Out: Findings:                 One mild benign-appearing, intrinsic stenosis was                            found. This measured 1.5 cm (inner diameter). The                            scope was withdrawn. Dilation was performed with a                            Maloney dilator with no resistance at 83 Fr.                           The exam of the esophagus was otherwise normal.                           The stomach was normal.                           The examined duodenum was normal.                           The cardia and gastric fundus were normal on                            retroflexion. Complications:            No immediate complications. Estimated Blood Loss:     Estimated blood loss: none. Impression:               - Benign-appearing esophageal stenosis. Dilated.                           - Normal stomach.                           -  Normal examined duodenum.                           - Active GERD symptoms. Recommendation:           1. Reflux precautions with attention to weight loss                           2. Increase pantoprazole to 40 mg twice daily.                            Rewrite prescription if needed                           3. GI office follow-up in 8-10 weeks. Docia Chuck. Henrene Pastor, MD 07/28/2016 4:24:38 PM This report has been signed electronically.

## 2016-07-28 NOTE — Patient Instructions (Signed)
YOU HAD AN ENDOSCOPIC PROCEDURE TODAY AT [facility]:   Refer to the procedure report that was given to you for any specific questions about what was found during the examination.  If the procedure report does not answer your questions, please call your gastroenterologist to clarify.  If you requested that your care partner not be given the details of your procedure findings, then the procedure report has been included in a sealed envelope for you to review at your convenience later.  YOU SHOULD EXPECT: Some feelings of bloating in the abdomen. Passage of more gas than usual.  Walking can help get rid of the air that was put into your GI tract during the procedure and reduce the bloating. If you had a lower endoscopy (such as a colonoscopy or flexible sigmoidoscopy) you may notice spotting of blood in your stool or on the toilet paper. If you underwent a bowel prep for your procedure, you may not have a normal bowel movement for a few days.  Please Note:  You might notice some irritation and congestion in your nose or some drainage.  This is from the oxygen used during your procedure.  There is no need for concern and it should clear up in a day or so.  SYMPTOMS TO REPORT IMMEDIATELY:   Following lower endoscopy (colonoscopy or flexible sigmoidoscopy):  Excessive amounts of blood in the stool  Significant tenderness or worsening of abdominal pains  Swelling of the abdomen that is new, acute  Fever of 100F or higher   Following upper endoscopy (EGD)  Vomiting of blood or coffee ground material  New chest pain or pain under the shoulder blades  Painful or persistently difficult swallowing  New shortness of breath  Fever of 100F or higher  Black, tarry-looking stools  For urgent or emergent issues, a gastroenterologist can be reached at any hour by calling [phone].   DIET:  Please follow the dilatation diet the rest of the day.  A handout was provided.   Drink plenty  of fluids but you should avoid alcoholic beverages for 24 hours.  ACTIVITY:  You should plan to take it easy for the rest of today and you should NOT DRIVE or use heavy machinery until tomorrow (because of the sedation medicines used during the test).    FOLLOW UP: Our staff will call the number listed on your records the next business day following your procedure to check on you and address any questions or concerns that you may have regarding the information given to you following your procedure. If we do not reach you, we will leave a message.  However, if you are feeling well and you are not experiencing any problems, there is no need to return our call.  We will assume that you have returned to your regular daily activities without incident.  If any biopsies were taken you will be contacted by phone or by letter within the next 1-3 weeks.  Please call us at [phone] if you have not heard about the biopsies in 3 weeks.    SIGNATURES/CONFIDENTIALITY: You and/or your care partner have signed paperwork which will be entered into your electronic medical record.  These signatures attest to the fact that that the information above on your After Visit Summary has been reviewed and is understood.  Full responsibility of the confidentiality of this discharge information lies with you and/or your care-partner.   Handouts were given to your care partner on GERD and a  dilatation diet to follow the rest of the day. Per Dr. Henrene Pastor increase pantoprazole 40 mg to twice daily.  Pt will call us back when she needs this rx since she just received a new rx. Follow up in the office with Dr. Henrene Pastor in 8-10 weeks.  If nurse on the 3rd floor has not called with appointment by Monday, then call the office to set it up. You may resume your current medications today.  Increasing pantoprazole to twice daily Please call if any questions or concerns.

## 2016-07-28 NOTE — Progress Notes (Signed)
To recovery awake alert VSS repor to RN

## 2016-07-29 ENCOUNTER — Telehealth: Payer: Self-pay | Admitting: *Deleted

## 2016-07-29 NOTE — Telephone Encounter (Signed)
  Follow up Call-  Call back number 07/28/2016  Post procedure Call Back phone  # [phone]  Permission to leave phone message Yes  Some recent data might be hidden     Patient questions:  Do you have a fever, pain , or abdominal swelling? No. Pain Score  0 *  Have you tolerated food without any problems? Yes.    Have you been able to return to your normal activities? Yes.    Do you have any questions about your discharge instructions: Diet   No. Medications  No. Follow up visit  No.  Do you have questions or concerns about your Care? No.  Actions: * If pain score is 4 or above: No action needed, pain <4.

## 2016-09-07 ENCOUNTER — Other Ambulatory Visit: Payer: Self-pay

## 2016-09-07 ENCOUNTER — Ambulatory Visit (INDEPENDENT_AMBULATORY_CARE_PROVIDER_SITE_OTHER): Payer: Medicare Other

## 2016-09-07 DIAGNOSIS — E538 Deficiency of other specified B group vitamins: Secondary | ICD-10-CM

## 2016-09-07 LAB — VITAMIN B12: Vitamin B-12: 187 pg/mL — ABNORMAL LOW (ref 211–911)

## 2016-09-07 MED ORDER — CYANOCOBALAMIN 1000 MCG/ML IJ SOLN
1000.0000 ug | Freq: Once | INTRAMUSCULAR | Status: AC
  Administered 2016-09-07: 1000 ug via INTRAMUSCULAR

## 2016-09-09 ENCOUNTER — Ambulatory Visit: Payer: Medicare Other | Admitting: Internal Medicine

## 2016-10-02 ENCOUNTER — Other Ambulatory Visit: Payer: Self-pay | Admitting: Family Medicine

## 2016-10-14 DIAGNOSIS — S93492A Sprain of other ligament of left ankle, initial encounter: Secondary | ICD-10-CM | POA: Diagnosis not present

## 2016-10-15 ENCOUNTER — Other Ambulatory Visit: Payer: Self-pay | Admitting: Physical Medicine and Rehabilitation

## 2016-10-15 DIAGNOSIS — M542 Cervicalgia: Secondary | ICD-10-CM

## 2016-10-15 DIAGNOSIS — M24112 Other articular cartilage disorders, left shoulder: Secondary | ICD-10-CM | POA: Diagnosis not present

## 2016-10-28 ENCOUNTER — Ambulatory Visit
Admission: RE | Admit: 2016-10-28 | Discharge: 2016-10-28 | Disposition: A | Discharge status: Home / Self Care | Payer: Medicare Other | Source: Ambulatory Visit | Attending: Physical Medicine and Rehabilitation | Admitting: Physical Medicine and Rehabilitation

## 2016-10-28 DIAGNOSIS — M542 Cervicalgia: Secondary | ICD-10-CM

## 2016-10-28 DIAGNOSIS — M50223 Other cervical disc displacement at C6-C7 level: Secondary | ICD-10-CM | POA: Diagnosis not present

## 2016-10-28 MED ORDER — MEPERIDINE HCL 100 MG/ML IJ SOLN
75.0000 mg | Freq: Once | INTRAMUSCULAR | Status: AC
  Administered 2016-10-28: 75 mg via INTRAMUSCULAR

## 2016-10-28 MED ORDER — ONDANSETRON HCL 4 MG/2ML IJ SOLN: 4.0000 mg | Freq: Four times a day (QID) | INTRAMUSCULAR | Status: DC | PRN

## 2016-10-28 MED ORDER — IOPAMIDOL (ISOVUE-M 300) INJECTION 61%
10.0000 mL | Freq: Once | INTRAMUSCULAR | Status: AC | PRN
  Administered 2016-10-28: 10 mL via INTRATHECAL

## 2016-10-28 MED ORDER — HYDROXYZINE HCL 50 MG/ML IM SOLN
25.0000 mg | Freq: Once | INTRAMUSCULAR | Status: AC
  Administered 2016-10-28: 25 mg via INTRAMUSCULAR

## 2016-10-28 MED ORDER — DIAZEPAM 5 MG PO TABS
10.0000 mg | ORAL_TABLET | Freq: Once | ORAL | Status: AC
  Administered 2016-10-28: 10 mg via ORAL

## 2016-10-28 NOTE — Progress Notes (Signed)
Patient states she has been off Fluoxetine for at least the past two days.  jkl

## 2016-10-28 NOTE — Discharge Instructions (Signed)
Myelogram Discharge Instructions  1. Go home and rest quietly for the next 24 hours.  It is important to lie flat for the next 24 hours.  Get up only to go to the restroom.  You may lie in the bed or on a couch on your back, your stomach, your left side or your right side.  You may have one pillow under your head.  You may have pillows between your knees while you are on your side or under your knees while you are on your back.  2. DO NOT drive today.  Recline the seat as far back as it will go, while still wearing your seat belt, on the way home.  3. You may get up to go to the bathroom as needed.  You may sit up for 10 minutes to eat.  You may resume your normal diet and medications unless otherwise indicated.  Drink lots of extra fluids today and tomorrow.  4. The incidence of headache, nausea, or vomiting is about 5% (one in 20 patients).  If you develop a headache, lie flat and drink plenty of fluids until the headache goes away.  Caffeinated beverages may be helpful.  If you develop severe nausea and vomiting or a headache that does not go away with flat bed rest, call [phone].  5. You may resume normal activities after your 24 hours of bed rest is over; however, do not exert yourself strongly or do any heavy lifting tomorrow. If when you get up you have a headache when standing, go back to bed and force fluids for another 24 hours.  6. Call your physician for a follow-up appointment.  The results of your myelogram will be sent directly to your physician by the following day.  7. If you have any questions or if complications develop after you arrive home, please call [phone].  Discharge instructions have been explained to the patient.  The patient, or the person responsible for the patient, fully understands these instructions.       May resume Fluoxetine on Jan. 5, 2018, after 9:30 am.

## 2016-10-29 ENCOUNTER — Ambulatory Visit: Payer: Medicare Other | Admitting: Neurology

## 2016-11-05 ENCOUNTER — Encounter: Payer: Self-pay | Admitting: Neurology

## 2016-11-05 ENCOUNTER — Ambulatory Visit (INDEPENDENT_AMBULATORY_CARE_PROVIDER_SITE_OTHER): Payer: Medicare Other | Admitting: Neurology

## 2016-11-05 VITALS — BP 148/87 | HR 70 | Ht 62.0 in | Wt 172.0 lb

## 2016-11-05 DIAGNOSIS — H53133 Sudden visual loss, bilateral: Secondary | ICD-10-CM | POA: Diagnosis not present

## 2016-11-05 DIAGNOSIS — R519 Headache, unspecified: Secondary | ICD-10-CM

## 2016-11-05 DIAGNOSIS — R42 Dizziness and giddiness: Secondary | ICD-10-CM | POA: Diagnosis not present

## 2016-11-05 DIAGNOSIS — R51 Headache: Secondary | ICD-10-CM

## 2016-11-05 NOTE — Progress Notes (Signed)
[facility]    Provider:  Dr Jaynee Eagles Referring Provider: [name], MD Primary Care Physician:  Garret Reddish, MD  CC:  Neck pain  HPI:  Alison Brooks is a 61 y.o. female here as a referral from Dr. Yong Channel for neck pain. HTN, HLD, Depression, Anxiety, Fibromyalgia. 6 months or a year ago she started having dizziness. She has a hx of meniere's disease but this is different. She can be sitting and watching TV and her eyes will start rolling, she has blurred vision and dizzy, not associated with headache or vision changes. She sometimes walks and gets lightheaded and dizzy. She has the episodes daily. It happened when she was driving and she felt the blurred vision and dizziness, both eyes affected, spinning sensation and blurry vision. Feels like vaseline in her eyes. Her balance is affected during the symptoms. Lasts 15-20 minutes and slowly resolves. She has persistent dizziness coming and going which sometimes is not associated with the vision changes and can last all day. Feels like she is on a boat and feels imbalanced during the episodes. Progressively worsening. She has a hx of migraine headaches over 30 years ago but not in recent years. She will get auras with sparkles and lines coming across her vision and vision changes. She has had more headaches recently but they are from the neck when she is looking down and she has shooting pain in the back of the head and a headache in the back of the head which triggers the dizziness. Nothing makes it better it just has to run its course. Happens every day. No other focal neurologic deficits, associated symptoms, inciting events or modifiable factors.  Reviewed notes, labs and imaging from outside physicians, which showed:   Personally reviewed images and agree with the following: CERVICAL MYELOGRAM FINDINGS:  There is fusion/failure of separation at C5-6. There are anterior extradural defects at C3-4, C4-5 and C6-7.  Disc space narrowing is present at C4-5 and C6-7. Slightly diminished filling of the C5 and C7 root sleeves. Flexion extension views do not show abnormal motion.  CT CERVICAL MYELOGRAM FINDINGS:  Foramen magnum is widely patent. There is ordinary osteoarthritis at the C1-2 articulation.  C2-3: Mild bulging of the disc. No facet arthropathy. No canal or foraminal stenosis.  C3-4: Moderate bulging of the disc. Mild bilateral uncovertebral hypertrophy. No facet arthropathy. Mild foraminal narrowing because of osteophytic encroachment.  C4-5: Spondylosis with endplate osteophytes and bulging of the disc. No facet arthropathy. Narrowing of the ventral subarachnoid space no compression of the cord. Foraminal stenosis bilaterally that could compress either or both C5 nerve roots.  C5-6:  Fusion with wide patency of the canal and foramina.  C6-7: Spondylosis with endplate osteophytes and bulging of the disc. Narrowing of the ventral subarachnoid space but no compression of the cord. Foraminal stenosis bilaterally that could compress either or both C7 nerve roots.  C7-T1:  Normal interspace.  IMPRESSION: Solid fusion at C5-6 with wide patency of the canal and foramina.  Degenerative spondylosis at C4-5 and C6-7. Narrowing of the ventral subarachnoid space but no compression of the cord. Foraminal stenosis bilaterally at those levels that could compress either or both C5 and C7 nerve roots.  Disc bulge and mild uncovertebral hypertrophy at C3-4 with mild foraminal narrowing.  Review of Systems: Patient complains of symptoms per HPI as well as the following symptoms: headache, weakness, dizziness, blurred vision, SOB, palpitations. Pertinent negatives per HPI. All others negative.   Social History  Social History  . Marital status: Married    Spouse name: Antony Haste  . Number of children: 1  . Years of education: College   Occupational History  . Retired Marine scientist     Social History Main Topics  . Smoking status: Former Smoker    Packs/day: 1.25    Years: 40.00    Types: Cigarettes    Quit date: 06/07/2008  . Smokeless tobacco: Never Used  . Alcohol use 0.0 oz/week     Comment: VERY RARE  . Drug use: No  . Sexual activity: Not on file   Other Topics Concern  . Not on file   Social History Narrative   Lives with spouse, Antony Haste.    Caffeine use: 1-2 cups coffee per day   Married 43 years in December. 1 child. 2 grandkids (boy and girl). Lives 10 miles away.       Disabled.       Hobbies: previously enjoyed dancing, old car shoes (78 chevy), reading    Family History  Problem Relation Age of Onset  . Heart attack Mother     Mom 43, Dad 33  . Hypertension Mother   . Heart disease Mother   . Heart disease Father   . Diabetes Father   . Colon cancer Maternal Grandmother     not sure age of onset    Past Medical History:  Diagnosis Date  . Arthritis    back, neck  . Chronic back pain   . COPD (chronic obstructive pulmonary disease) ([place])   . Depression   . Fibromyalgia   . GERD (gastroesophageal reflux disease)   . Hematuria   . History of kidney stones   . History of seizure    x1 2007 post op (per pt neurologist work-up done ?mixture of anesthesia medication and prozac that pt had taken)  and has not any issues since   . History of squamous cell carcinoma excision   . Hyperlipidemia   . Hypertension   . IC (interstitial cystitis)   . Migraine   . Pulmonary nodules    multiple per ct -- monitored by pcp  . Right ureteral stone   . RLS (restless legs syndrome)     Past Surgical History:  Procedure Laterality Date  . CARDIOVASCULAR STRESS TEST  09-02-2004   normal perfusion study/  normal LV function and wall motion, ef 64%  . CARPAL TUNNEL RELEASE Right   . CERVICAL FUSION  1992  . CHOLECYSTECTOMY  1995  . CYSTO/  BOTOX INJECTION  12-27-2008  &  04-01-2006  . CYSTO/  HYDRODISTENTION/  INSTILLATION THERAPY  03-04-2006   . CYSTOSCOPY W/ URETERAL STENT PLACEMENT Right 04/02/2016   Procedure: CYSTOSCOPY WITH RIGHT RETROGRADE, URETEROSCOPY PYELOGRAM LASER LITHOTRIPSY,/URETERAL STENT PLACEMENT;  Surgeon: Kathie Rhodes, MD;  Location: [place];  Service: Urology;  Laterality: Right;  . HEMORRHOID SURGERY  01-03-2004  . HOLMIUM LASER APPLICATION Right [id number]   Procedure: HOLMIUM LASER APPLICATION;  Surgeon: Kathie Rhodes, MD;  Location: [facility];  Service: Urology;  Laterality: Right;  . LUMBAR FUSION  1996   L4 --S1  . ROTATOR CUFF REPAIR Left 2004  . SHOULDER ARTHROSCOPY WITH BICEPS TENDON REPAIR Left 03/ 2017  . SPINAL CORD STIMULATOR IMPLANT  x4  last one 2013  . STONE EXTRACTION WITH BASKET Right 04/02/2016   Procedure: STONE EXTRACTION WITH BASKET;  Surgeon: Kathie Rhodes, MD;  Location: [facility];  Service: Urology;  Laterality: Right;  .  TONSILLECTOMY  1964  . TRANSTHORACIC ECHOCARDIOGRAM  04-17-2014   EF 60-65%/  trivial AR, MR, and TR  . VAGINAL HYSTERECTOMY  1984  . WRIST GANGLION EXCISION Right 02-05-2004    Current Outpatient Prescriptions  Medication Sig Dispense Refill  . allopurinol (ZYLOPRIM) 100 MG tablet Take 100 mg by mouth daily.    Marland Kitchen amLODipine (NORVASC) 2.5 MG tablet Take 1 tablet (2.5 mg total) by mouth daily. 30 tablet 5  . atorvastatin (LIPITOR) 40 MG tablet TAKE 1 TABLET BY MOUTH  DAILY 90 tablet 0  . baclofen (LIORESAL) 10 MG tablet Take 10 mg by mouth as needed.    . famotidine (PEPCID) 20 MG tablet Take 1 tablet by mouth  every night at bedtime 90 tablet 1  . FLUoxetine (PROZAC) 40 MG capsule TAKE 1 CAPSULE BY MOUTH  DAILY 90 capsule 0  . oxybutynin (DITROPAN) 5 MG tablet TAKE 1 TABLET BY MOUTH 2  TIMES DAILY AS NEEDED FOR  BLADDER SPASMS. 180 tablet 3  . pantoprazole (PROTONIX) 40 MG tablet TAKE 1 TABLET BY MOUTH  DAILY. TAKE 30-60 MIN  BEFORE FIRST MEAL OF THE  DAY 90 tablet 1  . topiramate (TOPAMAX) 200 MG tablet TAKE 1 TABLET  BY MOUTH  DAILY 90 tablet 0   No current facility-administered medications for this visit.     Allergies as of 11/05/2016 - Review Complete 11/05/2016  Allergen Reaction Noted  . Latex Other (See Comments) 06/07/2013  . Oxycodone Nausea And Vomiting 03/11/2016  . Zofran [ondansetron hcl] Other (See Comments) 03/11/2016    Vitals: BP (!) 148/87 (BP Location: Right Arm, Patient Position: Sitting, Cuff Size: Normal)   Pulse 70   Ht 5\' 2"  (1.575 m)   Wt 172 lb (78 kg)   BMI 31.46 kg/m  Last Weight:  Wt Readings from Last 1 Encounters:  11/05/16 172 lb (78 kg)   Last Height:   Ht Readings from Last 1 Encounters:  11/05/16 5\' 2"  (1.575 m)   Physical exam: Exam: Gen: NAD, conversant, well nourised, obese, well groomed                     CV: RRR, no MRG. No Carotid Bruits. No peripheral edema, warm, nontender Eyes: Conjunctivae clear without exudates or hemorrhage  Neuro: Detailed Neurologic Exam  Speech:    Speech is normal; fluent and spontaneous with normal comprehension.  Cognition:    The patient is oriented to person, place, and time;     recent and remote memory intact;     language fluent;     normal attention, concentration,     fund of knowledge Cranial Nerves:    The pupils are equal, round, and reactive to light. The fundi are normal and spontaneous venous pulsations are present. Visual fields are full to finger confrontation. Extraocular movements are intact. Trigeminal sensation is intact and the muscles of mastication are normal. The face is symmetric. The palate elevates in the midline. Hearing intact. Voice is normal. Shoulder shrug is normal. The tongue has normal motion without fasciculations.   Coordination:    Normal finger to nose and heel to shin. Normal rapid alternating movements.   Gait:    Has a boot on her left leg, antalgic gait  Motor Observation:    No asymmetry, no atrophy, and no involuntary movements noted. Tone:    Normal muscle  tone.    Posture:    Posture is normal. normal erect    Strength:  Strength is V/V in the upper and lower limbs.      Sensation: intact to LT     Reflex Exam:  DTR's:    Deep tendon reflexes in the upper and lower extremities are normal bilaterally.   Toes:    The toes are downgoing bilaterally.   Clonus:    Clonus is absent.       Assessment/Plan:  61 year old female here for dizziness, vertigo, blurred vision and neck pain. She has a history of migraines and auras, may be vestibular migraines but need to rule out all other causes.  - CT of the head, CTA head and neck for evaluation of vision changes, headaches, dizziness for lesions, masses or vascular causes such as thromboembolic cerebrovascular disease or dissection - Vestibular therapy - If she is not improved by PT and no other etiology found may consider treating for vestibular migraines - check bmp today  Discussed: To prevent or relieve headaches, try the following: Cool Compress. Lie down and place a cool compress on your head.  Avoid headache triggers. If certain foods or odors seem to have triggered your migraines in the past, avoid them. A headache diary might help you identify triggers.  Include physical activity in your daily routine. Try a daily walk or other moderate aerobic exercise.  Manage stress. Find healthy ways to cope with the stressors, such as delegating tasks on your to-do list.  Practice relaxation techniques. Try deep breathing, yoga, massage and visualization.  Eat regularly. Eating regularly scheduled meals and maintaining a healthy diet might help prevent headaches. Also, drink plenty of fluids.  Follow a regular sleep schedule. Sleep deprivation might contribute to headaches Consider biofeedback. With this mind-body technique, you learn to control certain bodily functions - such as muscle tension, heart rate and blood pressure - to prevent headaches or reduce headache pain.    Proceed to  emergency room if you experience new or worsening symptoms or symptoms do not resolve, if you have new neurologic symptoms or if headache is severe, or for any concerning symptom.   Cc: Garret Reddish, MD   Sarina Ill, MD  [facility] [address] [place], [place] [address]  Phone [phone] Fax [phone]

## 2016-11-05 NOTE — Patient Instructions (Addendum)
Remember to drink plenty of fluid, eat healthy meals and do not skip any meals. Try to eat protein with a every meal and eat a healthy snack such as fruit or nuts in between meals. Try to keep a regular sleep-wake schedule and try to exercise daily, particularly in the form of walking, 20-30 minutes a day, if you can.   As far as diagnostic testing: CT of the head and blood vessels, vestibular therapy  I would like to see you back after vestibular therapy, sooner if we need to. Please call us with any interim questions, concerns, problems, updates or refill requests.    Our phone number is [phone]. We also have an after hours call service for urgent matters and there is a physician on-call for urgent questions. For any emergencies you know to call 911 or go to the nearest emergency room

## 2016-11-06 DIAGNOSIS — R42 Dizziness and giddiness: Secondary | ICD-10-CM | POA: Insufficient documentation

## 2016-11-06 LAB — BASIC METABOLIC PANEL
BUN/Creatinine Ratio: 17 (ref 12–28)
BUN: 13 mg/dL (ref 8–27)
CO2: 22 mmol/L (ref 18–29)
CREATININE: 0.75 mg/dL (ref 0.57–1.00)
Calcium: 9.3 mg/dL (ref 8.7–10.3)
Chloride: 103 mmol/L (ref 96–106)
GFR calc Af Amer: 99 mL/min/{1.73_m2} (ref >59)
GFR calc non Af Amer: 86 mL/min/{1.73_m2} (ref >59)
GLUCOSE: 94 mg/dL (ref 65–99)
POTASSIUM: 5.2 mmol/L (ref 3.5–5.2)
SODIUM: 141 mmol/L (ref 134–144)

## 2016-11-08 ENCOUNTER — Telehealth: Payer: Self-pay | Admitting: *Deleted

## 2016-11-08 NOTE — Telephone Encounter (Signed)
Called and spoke to pt about normal labs per AA,MD note. She verbalized understanding.  Advised her orders for imaging got sent to GI. She should be called soon to schedule. She verbalized understanding.

## 2016-11-08 NOTE — Telephone Encounter (Signed)
-----   Message from Melvenia Beam, MD sent at 11/06/2016 10:27 AM EST ----- Labs normal thanks

## 2016-11-09 ENCOUNTER — Other Ambulatory Visit: Payer: Self-pay | Admitting: Neurology

## 2016-11-11 ENCOUNTER — Other Ambulatory Visit: Payer: Medicare Other

## 2016-11-17 ENCOUNTER — Other Ambulatory Visit: Payer: Medicare Other

## 2016-11-17 ENCOUNTER — Ambulatory Visit (HOSPITAL_COMMUNITY): Payer: Medicare Other | Admitting: Physical Therapy

## 2016-11-17 ENCOUNTER — Inpatient Hospital Stay: Admission: RE | Admit: 2016-11-17 | Payer: Medicare Other | Source: Ambulatory Visit

## 2016-11-24 ENCOUNTER — Ambulatory Visit
Admission: RE | Admit: 2016-11-24 | Discharge: 2016-11-24 | Disposition: A | Discharge status: Home / Self Care | Payer: Medicare Other | Source: Ambulatory Visit | Attending: Neurology | Admitting: Neurology

## 2016-11-24 DIAGNOSIS — R51 Headache: Secondary | ICD-10-CM

## 2016-11-24 DIAGNOSIS — R42 Dizziness and giddiness: Secondary | ICD-10-CM

## 2016-11-24 DIAGNOSIS — H53133 Sudden visual loss, bilateral: Secondary | ICD-10-CM

## 2016-11-24 DIAGNOSIS — R519 Headache, unspecified: Secondary | ICD-10-CM

## 2016-11-24 DIAGNOSIS — I6523 Occlusion and stenosis of bilateral carotid arteries: Secondary | ICD-10-CM | POA: Diagnosis not present

## 2016-11-24 MED ORDER — IOPAMIDOL (ISOVUE-370) INJECTION 76%
80.0000 mL | Freq: Once | INTRAVENOUS | Status: AC | PRN
  Administered 2016-11-24: 80 mL via INTRAVENOUS

## 2016-11-25 ENCOUNTER — Ambulatory Visit (INDEPENDENT_AMBULATORY_CARE_PROVIDER_SITE_OTHER): Payer: Medicare Other | Admitting: Family Medicine

## 2016-11-25 ENCOUNTER — Telehealth: Payer: Self-pay | Admitting: *Deleted

## 2016-11-25 VITALS — BP 140/90 | HR 76 | Temp 98.7°F | Wt 168.4 lb

## 2016-11-25 DIAGNOSIS — J40 Bronchitis, not specified as acute or chronic: Secondary | ICD-10-CM | POA: Diagnosis not present

## 2016-11-25 DIAGNOSIS — R05 Cough: Secondary | ICD-10-CM | POA: Diagnosis not present

## 2016-11-25 DIAGNOSIS — E538 Deficiency of other specified B group vitamins: Secondary | ICD-10-CM

## 2016-11-25 DIAGNOSIS — R059 Cough, unspecified: Secondary | ICD-10-CM

## 2016-11-25 LAB — POCT INFLUENZA A/B
INFLUENZA A, POC: NEGATIVE
Influenza B, POC: NEGATIVE

## 2016-11-25 MED ORDER — PROMETHAZINE-DM 6.25-15 MG/5ML PO SYRP: 5.0000 mL | ORAL_SOLUTION | Freq: Four times a day (QID) | ORAL | 0 refills | Status: DC | PRN

## 2016-11-25 MED ORDER — CYANOCOBALAMIN 1000 MCG/ML IJ SOLN
1000.0000 ug | Freq: Once | INTRAMUSCULAR | Status: AC
  Administered 2016-11-25: 1000 ug via INTRAMUSCULAR

## 2016-11-25 MED ORDER — AZITHROMYCIN 250 MG PO TABS: ORAL_TABLET | ORAL | 0 refills | Status: DC

## 2016-11-25 NOTE — Progress Notes (Signed)
Subjective:    Patient ID: Alison Brooks, female    DOB: 1956-01-15, 61 y.o.   MRN: [id number]  HPI  Alison Brooks is a 61 year old female who presents today with cough that started 2 weeks ago.  Cough is nonproductive and she reports sleep disturbance from coughing.Associated symptoms of chills, sweats, and fatigue. She denies sinus pressure/pain, myalgias, post nasal drip, sore throat, N/V/D, ear pain, and post nasal drip. Treatment at home with OTC cough medication, Mucinex, and decongestant that has provided limited benefit, She reports that symptoms had improved but did not resolve and symptoms are worsening at this time. She is UTD with inflenza vaccine.  She reports recent exposure to sick grandchildren. She is not a smoker (quit 2009) and denies recent antibiotic use.  Review of Systems  Constitutional: Positive for chills and fatigue. Negative for fever.  HENT: Negative for congestion, ear pain, postnasal drip, rhinorrhea, sinus pain, sinus pressure and sore throat.   Respiratory: Positive for cough. Negative for shortness of breath and wheezing.   Cardiovascular: Negative for chest pain and palpitations.  Gastrointestinal: Negative for abdominal pain, diarrhea, nausea and vomiting.  Musculoskeletal: Negative for myalgias.  Neurological: Negative for dizziness, light-headedness and headaches.   Past Medical History:  Diagnosis Date  . Arthritis    back, neck  . Chronic back pain   . COPD (chronic obstructive pulmonary disease) ([place])   . Depression   . Fibromyalgia   . GERD (gastroesophageal reflux disease)   . Hematuria   . History of kidney stones   . History of seizure    x1 2007 post op (per pt neurologist work-up done ?mixture of anesthesia medication and prozac that pt had taken)  and has not any issues since   . History of squamous cell carcinoma excision   . Hyperlipidemia   . Hypertension   . IC (interstitial cystitis)   . Migraine   . Pulmonary nodules    multiple per ct -- monitored by pcp  . Right ureteral stone   . RLS (restless legs syndrome)      Social History   Social History  . Marital status: Married    Spouse name: Antony Haste  . Number of children: 1  . Years of education: College   Occupational History  . Retired Marine scientist    Social History Main Topics  . Smoking status: Former Smoker    Packs/day: 1.25    Years: 40.00    Types: Cigarettes    Quit date: 06/07/2008  . Smokeless tobacco: Never Used  . Alcohol use 0.0 oz/week     Comment: VERY RARE  . Drug use: No  . Sexual activity: Not on file   Other Topics Concern  . Not on file   Social History Narrative   Lives with spouse, Antony Haste.    Caffeine use: 1-2 cups coffee per day   Married 43 years in December. 1 child. 2 grandkids (boy and girl). Lives 10 miles away.       Disabled.       Hobbies: previously enjoyed dancing, old car shoes (57 chevy), reading    Past Surgical History:  Procedure Laterality Date  . CARDIOVASCULAR STRESS TEST  09-02-2004   normal perfusion study/  normal LV function and wall motion, ef 64%  . CARPAL TUNNEL RELEASE Right   . CERVICAL FUSION  1992  . CHOLECYSTECTOMY  1995  . CYSTO/  BOTOX INJECTION  12-27-2008  &  04-01-2006  . CYSTO/  HYDRODISTENTION/  INSTILLATION THERAPY  03-04-2006  . CYSTOSCOPY W/ URETERAL STENT PLACEMENT Right 04/02/2016   Procedure: CYSTOSCOPY WITH RIGHT RETROGRADE, URETEROSCOPY PYELOGRAM LASER LITHOTRIPSY,/URETERAL STENT PLACEMENT;  Surgeon: Kathie Rhodes, MD;  Location: [place];  Service: Urology;  Laterality: Right;  . HEMORRHOID SURGERY  01-03-2004  . HOLMIUM LASER APPLICATION Right [id number]   Procedure: HOLMIUM LASER APPLICATION;  Surgeon: Kathie Rhodes, MD;  Location: [facility];  Service: Urology;  Laterality: Right;  . LUMBAR FUSION  1996   L4 --S1  . ROTATOR CUFF REPAIR Left 2004  . SHOULDER ARTHROSCOPY WITH BICEPS TENDON REPAIR Left 03/ 2017  . SPINAL CORD STIMULATOR  IMPLANT  x4  last one 2013  . STONE EXTRACTION WITH BASKET Right 04/02/2016   Procedure: STONE EXTRACTION WITH BASKET;  Surgeon: Kathie Rhodes, MD;  Location: [facility];  Service: Urology;  Laterality: Right;  . TONSILLECTOMY  1964  . TRANSTHORACIC ECHOCARDIOGRAM  04-17-2014   EF 60-65%/  trivial AR, MR, and TR  . VAGINAL HYSTERECTOMY  1984  . WRIST GANGLION EXCISION Right 02-05-2004    Family History  Problem Relation Age of Onset  . Heart attack Mother     Mom 69, Dad 65  . Hypertension Mother   . Heart disease Mother   . Heart disease Father   . Diabetes Father   . Colon cancer Maternal Grandmother     not sure age of onset    Allergies  Allergen Reactions  . Latex Other (See Comments)    blisters  . Oxycodone Nausea And Vomiting    And sweating  . Zofran [Ondansetron Hcl] Other (See Comments)    headache    Current Outpatient Prescriptions on File Prior to Visit  Medication Sig Dispense Refill  . allopurinol (ZYLOPRIM) 100 MG tablet Take 100 mg by mouth daily.    Marland Kitchen amLODipine (NORVASC) 2.5 MG tablet Take 1 tablet (2.5 mg total) by mouth daily. 30 tablet 5  . atorvastatin (LIPITOR) 40 MG tablet TAKE 1 TABLET BY MOUTH  DAILY 90 tablet 0  . baclofen (LIORESAL) 10 MG tablet Take 10 mg by mouth as needed.    . famotidine (PEPCID) 20 MG tablet Take 1 tablet by mouth  every night at bedtime 90 tablet 1  . FLUoxetine (PROZAC) 40 MG capsule TAKE 1 CAPSULE BY MOUTH  DAILY 90 capsule 0  . oxybutynin (DITROPAN) 5 MG tablet TAKE 1 TABLET BY MOUTH 2  TIMES DAILY AS NEEDED FOR  BLADDER SPASMS. 180 tablet 3  . pantoprazole (PROTONIX) 40 MG tablet TAKE 1 TABLET BY MOUTH  DAILY. TAKE 30-60 MIN  BEFORE FIRST MEAL OF THE  DAY 90 tablet 1  . topiramate (TOPAMAX) 200 MG tablet TAKE 1 TABLET BY MOUTH  DAILY 90 tablet 0  . [DISCONTINUED] orlistat (XENICAL) 120 MG capsule Take 1 capsule (120 mg total) by mouth 3 (three) times daily with meals. 90 capsule 5   No current  facility-administered medications on file prior to visit.     BP 140/90 (BP Location: Left Arm, Patient Position: Sitting, Cuff Size: Normal)   Pulse 76   Temp 98.7 F (37.1 C) (Oral)   Wt 168 lb 6.4 oz (76.4 kg)   SpO2 98%   BMI 30.80 kg/m       Objective:   Physical Exam  Constitutional: She is oriented to person, place, and time. She appears well-developed and well-nourished.  HENT:  Right Ear: Tympanic membrane normal.  Left Ear: Tympanic  membrane normal.  Nose: No rhinorrhea. Right sinus exhibits no maxillary sinus tenderness and no frontal sinus tenderness. Left sinus exhibits no maxillary sinus tenderness and no frontal sinus tenderness.  Mouth/Throat: Mucous membranes are normal. No oropharyngeal exudate or posterior oropharyngeal erythema.  Eyes: Pupils are equal, round, and reactive to light. No scleral icterus.  Neck: Neck supple.  Cardiovascular: Normal rate and regular rhythm.   Pulmonary/Chest: Effort normal and breath sounds normal. She has no wheezes. She has no rales.  Lymphadenopathy:    She has cervical adenopathy.  Neurological: She is alert and oriented to person, place, and time.  Skin: Skin is warm and dry. No rash noted.       Assessment & Plan:  1. Bronchitis Double sickening presentation; length of symptoms; history of smoking; will treat with azithromycin. We discussed the reasons for providing antibiotic and also advised her to follow up if symptoms do not improve in 3 to 4 days with treatment, worsen, or she develops a fever >100. - azithromycin (ZITHROMAX) 250 MG tablet; Take 2 tablets at once today and take one tablet daily for 4 days.  Dispense: 6 tablet; Refill: 0  2. Cough Mucinex DM for cough or promethazine-DM for cough not controlled with Mucinex. - POCT Influenza A/B - promethazine-dextromethorphan (PROMETHAZINE-DM) 6.25-15 MG/5ML syrup; Take 5 mLs by mouth 4 (four) times daily as needed for cough.  Dispense: 118 mL; Refill: 0  Delano Metz, FNP-C

## 2016-11-25 NOTE — Telephone Encounter (Signed)
Called and spoke to husband Antony Haste, and relayed results per AA,MD note. He verbalized understanding and will remind patient to schedule for vestibular therapy if she hasn't already.

## 2016-11-25 NOTE — Telephone Encounter (Signed)
-----   Message from Melvenia Beam, MD sent at 11/25/2016 10:16 AM EST ----- CT Angio of the head and neck normal for age thanks

## 2016-11-25 NOTE — Patient Instructions (Signed)
Please take medication as directed and follow up if symptoms do not improve with treatment in 3 to 4 days, worsen, or you develop a fever >100.   Acute Bronchitis, Adult Acute bronchitis is when air tubes (bronchi) in the lungs suddenly get swollen. The condition can make it hard to breathe. It can also cause these symptoms:  A cough.  Coughing up clear, yellow, or green mucus.  Wheezing.  Chest congestion.  Shortness of breath.  A fever.  Body aches.  Chills.  A sore throat. Follow these instructions at home: Medicines  Take over-the-counter and prescription medicines only as told by your doctor.  If you were prescribed an antibiotic medicine, take it as told by your doctor. Do not stop taking the antibiotic even if you start to feel better. General instructions  Rest.  Drink enough fluids to keep your pee (urine) clear or pale yellow.  Avoid smoking and secondhand smoke. If you smoke and you need help quitting, ask your doctor. Quitting will help your lungs heal faster.  Use an inhaler, cool mist vaporizer, or humidifier as told by your doctor.  Keep all follow-up visits as told by your doctor. This is important. How is this prevented? To lower your risk of getting this condition again:  Wash your hands often with soap and water. If you cannot use soap and water, use hand sanitizer.  Avoid contact with people who have cold symptoms.  Try not to touch your hands to your mouth, nose, or eyes.  Make sure to get the flu shot every year. Contact a doctor if:  Your symptoms do not get better in 2 weeks. Get help right away if:  You cough up blood.  You have chest pain.  You have very bad shortness of breath.  You become dehydrated.  You faint (pass out) or keep feeling like you are going to pass out.  You keep throwing up (vomiting).  You have a very bad headache.  Your fever or chills gets worse. This information is not intended to replace advice  given to you by your health care provider. Make sure you discuss any questions you have with your health care provider. Document Released: 03/29/2008 Document Revised: 05/19/2016 Document Reviewed: 03/31/2016 Elsevier Interactive Patient Education  2017 [employer].

## 2016-11-25 NOTE — Progress Notes (Signed)
Pre visit review using our clinic review tool, if applicable. No additional management support is needed unless otherwise documented below in the visit note. 

## 2017-02-08 ENCOUNTER — Other Ambulatory Visit: Payer: Self-pay | Admitting: Family Medicine

## 2017-03-15 ENCOUNTER — Other Ambulatory Visit: Payer: Self-pay | Admitting: Family Medicine

## 2017-03-23 IMAGING — CT CT ANGIO HEAD
2 of 13 series · 16 of 47 positions shown · IV contrast (APPLIED)
Comparison: Prior CT from 02/06/2007.

CLINICAL DATA: Initial evaluation for dizziness with blurry vision
and nausea for 7 months. History of prior cervical spine fusion.

EXAM:
CT ANGIOGRAPHY HEAD AND NECK
TECHNIQUE: Multidetector CT imaging of the head and neck was performed using
the standard protocol during bolus administration of intravenous
contrast. Multiplanar CT image reconstructions and MIPs were
obtained to evaluate the vascular anatomy. Carotid stenosis
measurements (when applicable) are obtained utilizing NASCET
criteria, using the distal internal carotid diameter as the
denominator.
CONTRAST:  80 cc of Isovue 370.

[Series 6: thin · axial · 0.36mm/px · z∈[+822,+1143]mm · 15 of 1206 slices shown]
[im 67/1206  brain]
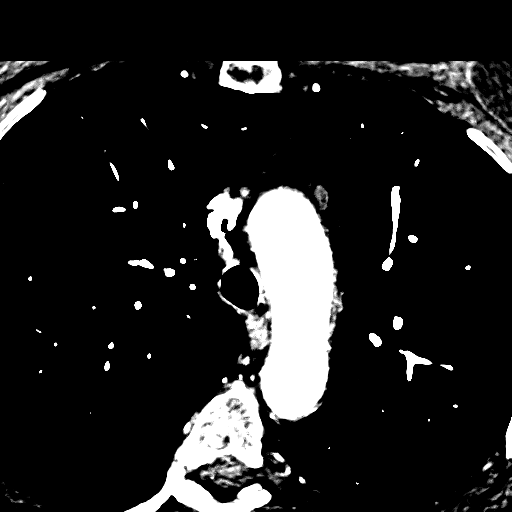
[im 134/1206  bone]
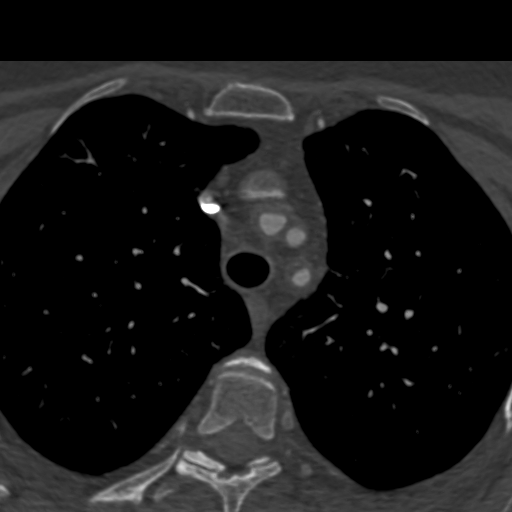
[im 201/1206  brain]
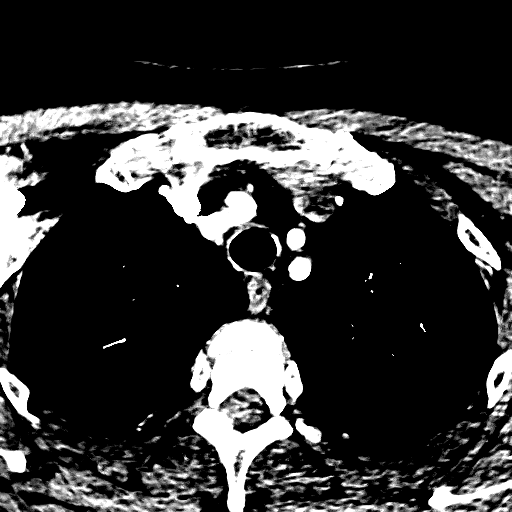
[im 268/1206  bone]
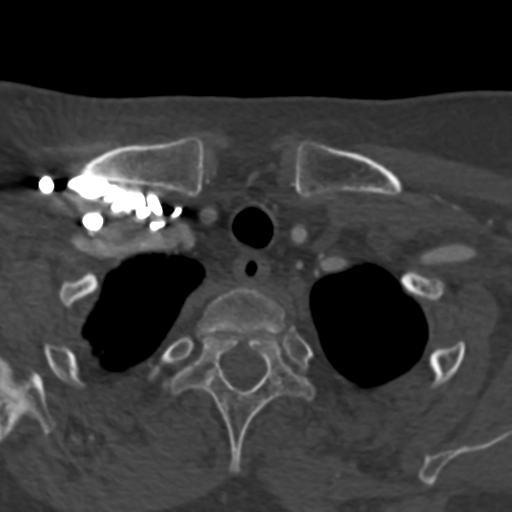
[im 402/1206  brain]
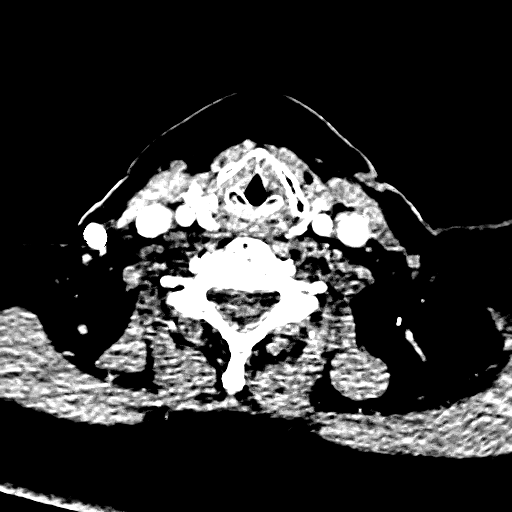
[im 469/1206  bone]
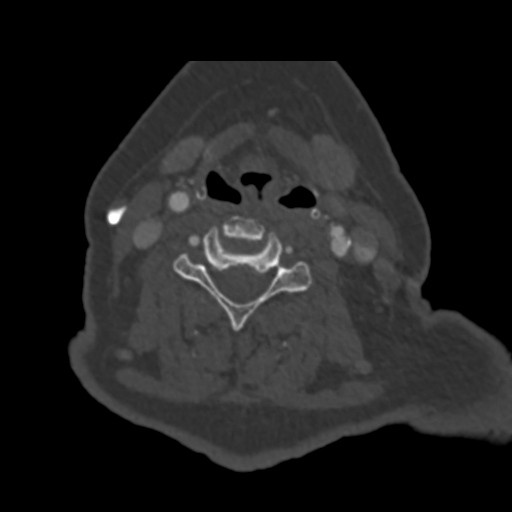
[im 536/1206  brain]
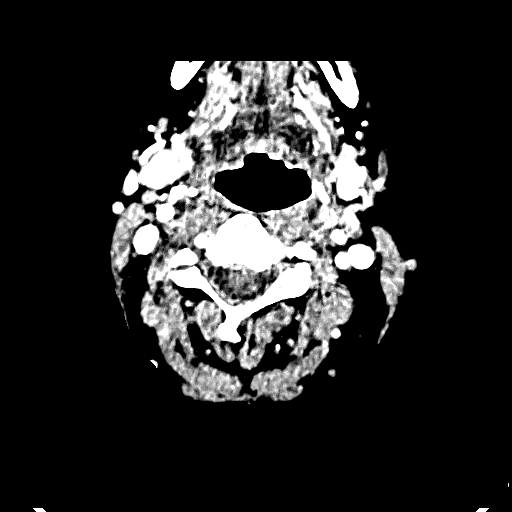
[im 603/1206  bone]
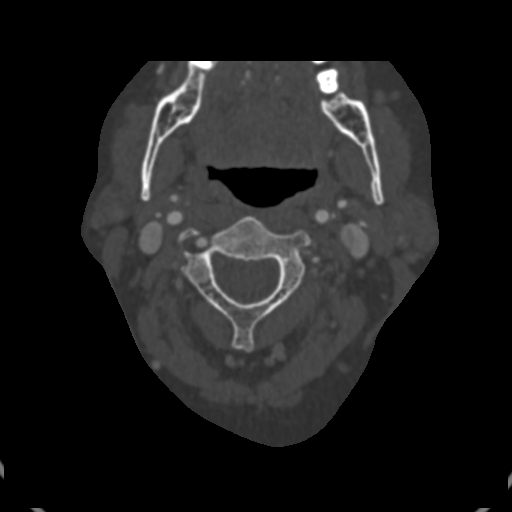
[im 670/1206  brain]
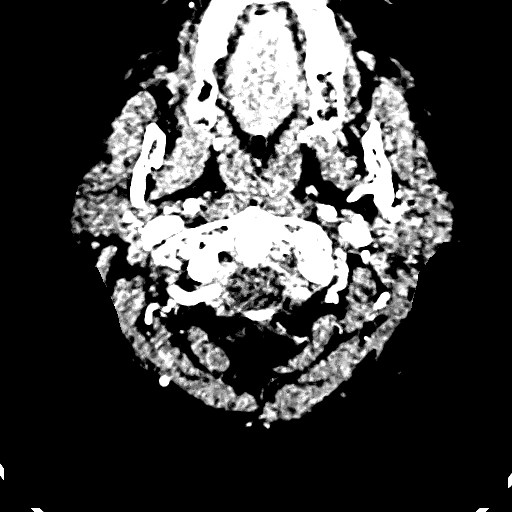
[im 737/1206  bone]
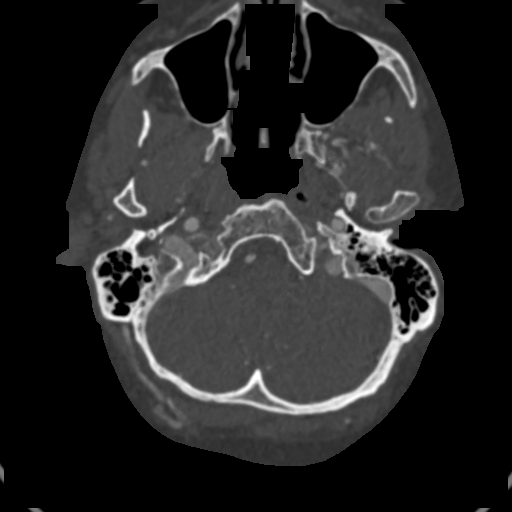
[im 804/1206  brain]
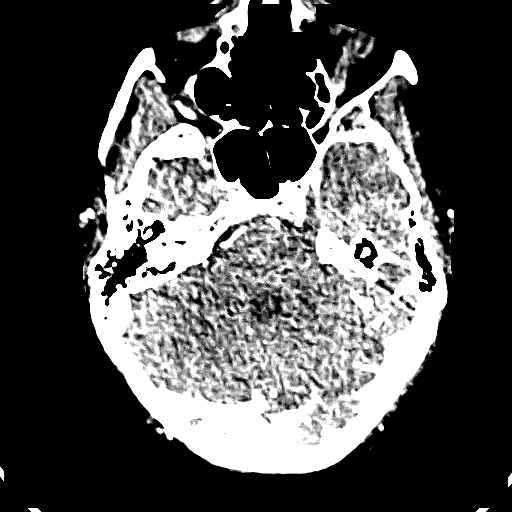
[im 938/1206  bone]
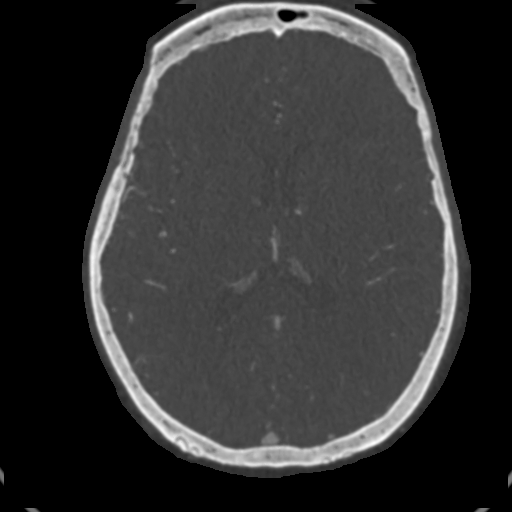
[im 1005/1206  brain]
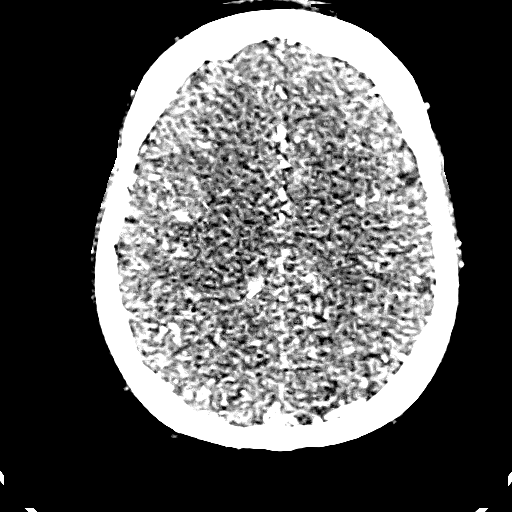
[im 1072/1206  bone]
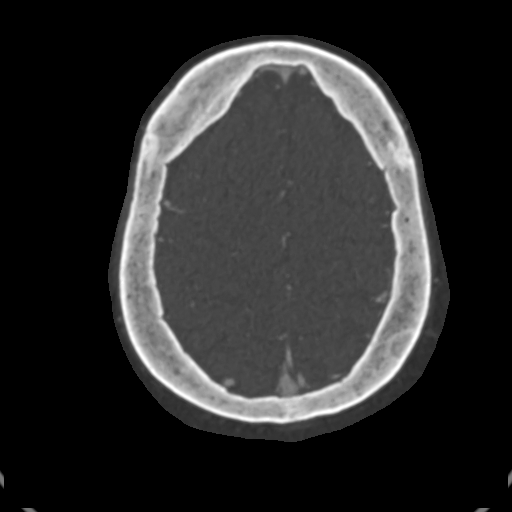
[im 1139/1206  brain]
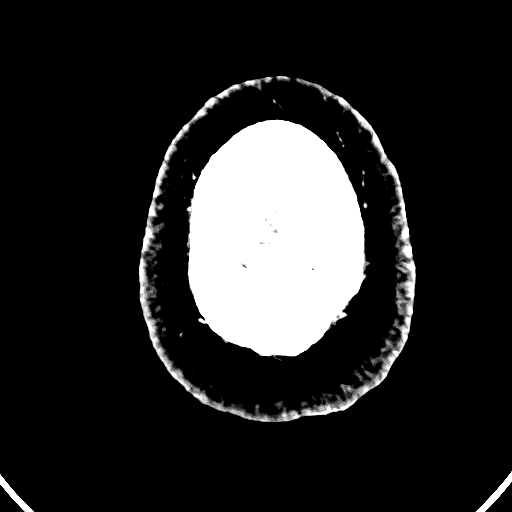

[Series 14: cor · coronal · 0.29mm/px · 1 of 86 slices shown]
[im 43/86  brain]
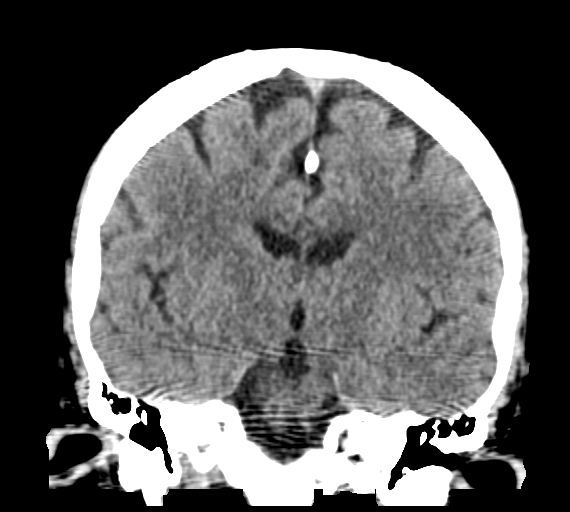

[16 of 47 positions shown; findings below may reference images not displayed]

FINDINGS: CT HEAD FINDINGS

Brain: Age appropriate cerebral atrophy with mild chronic small
vessel ischemic disease. The the the the no acute intracranial
hemorrhage. No evidence for acute large vessel territory infarct. No
mass lesion, midline shift or mass effect. No hydrocephalus. No
extra-axial fluid collection.

Vascular: No hyperdense vessel.

Skull: Scalp soft tissues demonstrate no acute abnormality.
Calvarium intact.

Sinuses: Visualized paranasal sinuses and mastoids are clear.

Orbits: Globes and orbital soft tissues unremarkable.

Review of the MIP images confirms the above findings

CTA NECK FINDINGS

Aortic arch: Visualized aortic arch of normal caliber with normal
branch pattern. Scattered calcified noncalcified plaque within the
arch itself and about the origin of the great vessels. There is
short-segment stenosis of approximately 40% at the proximal left
subclavian artery (series 6, image 9033). Visualized subclavian
arteries otherwise widely patent.

Right carotid system: Right common carotid artery patent from its
origin to the bifurcation. Mild eccentric calcified plaque about the
right carotid bifurcation/proximal right ICA without flow-limiting
stenosis. Right ICA widely patent distally to the skullbase. No
significant stenosis or evidence for dissection, or vascular
occlusion within the right carotid artery system. Centric calcified
plaque about the right bifurcation without significant stenosis.

Left carotid system: Left common carotid artery patent from its
origin to the bifurcation. Mild eccentric plaque about the left
bifurcation without significant stenosis. Left ICA widely patent
distally to the skullbase without stenosis, dissection, or
occlusion.

Vertebral arteries: Both of the vertebral arteries arise from the
subclavian arteries. Right vertebral artery dominant. Left vertebral
artery diffusely hypoplastic. Vertebral arteries widely patent
within the neck without stenosis, dissection, or occlusion.

Skeleton: No acute osseous abnormality. Patient is status post solid
osseous fusion at C5-6. No worrisome lytic or blastic osseous
lesions.

Other neck: Soft tissues of the neck demonstrate no acute
abnormality. No adenopathy. Thyroid normal.

Upper chest: Visualized upper mediastinum unremarkable. Visualized
lungs are clear. Emphysema noted.

Review of the MIP images confirms the above findings

CTA HEAD FINDINGS

Anterior circulation: Petrous segments widely patent bilaterally.
Cavernous and supraclinoid right ICA widely patent without stenosis.
Scattered plaque noted within the cavernous left ICA without
stenosis. A1 segments widely patent. Anterior communicating artery
normal. Anterior cerebral artery is widely patent to their distal
aspects. M1 segments widely patent without stenosis or occlusion.
MCA bifurcations within normal limits. No proximal M2 occlusion or
high-grade stenosis. Distal MCA branches well opacified and
symmetric.

Posterior circulation: The dominant right vertebral artery widely
patent to the vertebrobasilar junction. Hypoplastic left vertebral
artery largely terminates in PICA. Posterior inferior cerebral
arteries themselves are widely patent. Basilar artery somewhat
diminutive but widely patent to its distal aspect without stenosis.
Superior cerebral arteries patent bilaterally. Predominant fetal
type PCAs bilaterally supplied via widely patent posterior
communicating arteries. PCAs are widely patent to their distal
aspects.

Venous sinuses: Patent.

Anatomic variants: Predominant fetal type PCAs bilaterally with
somewhat diminutive vertebrobasilar system. Hypoplastic left
vertebral artery terminates in PICA. No aneurysm or vascular
malformation.

Delayed phase: No pathologic enhancement.

Review of the MIP images confirms the above findings
IMPRESSION: 1. Negative CTA of the head and neck. No large vessel occlusion. No
high-grade or flow-limiting stenosis within the carotid
arteries/anterior circulation or posterior circulation.
2. Mild for age atheromatous plaque about the carotid bifurcations
without flow-limiting stenosis.
3. Short-segment moderate atheromatous stenosis of 40% at the
proximal left subclavian artery.
4. Fetal type origin of the PCAs bilaterally with somewhat
diminutive vertebrobasilar system. Left vertebral artery diffusely
hypoplastic and terminates in PICA. These are normal anatomic
variants.
5. Emphysema.

## 2017-05-05 ENCOUNTER — Other Ambulatory Visit: Payer: Self-pay | Admitting: Family Medicine

## 2017-06-10 ENCOUNTER — Other Ambulatory Visit: Payer: Self-pay | Admitting: Family Medicine

## 2017-06-24 ENCOUNTER — Telehealth: Payer: Self-pay | Admitting: Family Medicine

## 2017-06-24 MED ORDER — PREDNISONE 20 MG PO TABS: ORAL_TABLET | ORAL | 0 refills | Status: DC

## 2017-06-24 NOTE — Telephone Encounter (Signed)
Pt sates she thinks its her sciatica is bothering her. Pain in groin, pain in hip, pain runs down to foot and burning and tingling.  Pt would like to know if Dr will call her in an anti inflammatory med. Pt had some robaxin she took but that has not worked.  [place], [place] pt may need an appt, pt hopes not.

## 2017-06-24 NOTE — Telephone Encounter (Signed)
Sent in low dose prednisone- please inform patient. If she doesn't improve on this needs to be seen to discuss higher dose

## 2017-06-28 NOTE — Telephone Encounter (Signed)
Spoke with patient who confirmed she had picked up her medication from the pharmacy and started it. She states she is feeling much better.

## 2017-07-26 ENCOUNTER — Telehealth: Payer: Self-pay

## 2017-07-26 NOTE — Telephone Encounter (Signed)
Called patient as we received a notice from [place]. I left a voicemail message encouraging her to call [facility] at [phone]. I did leave a call back number here to this office if she has questions

## 2017-07-27 DIAGNOSIS — Z803 Family history of malignant neoplasm of breast: Secondary | ICD-10-CM | POA: Diagnosis not present

## 2017-07-27 DIAGNOSIS — Z1231 Encounter for screening mammogram for malignant neoplasm of breast: Secondary | ICD-10-CM | POA: Diagnosis not present

## 2017-07-27 LAB — HM MAMMOGRAPHY

## 2017-08-01 ENCOUNTER — Encounter: Payer: Self-pay | Admitting: Family Medicine

## 2017-08-09 DIAGNOSIS — M25562 Pain in left knee: Secondary | ICD-10-CM | POA: Diagnosis not present

## 2017-09-18 ENCOUNTER — Other Ambulatory Visit: Payer: Self-pay | Admitting: Family Medicine

## 2017-09-19 MED ORDER — PANTOPRAZOLE SODIUM 40 MG PO TBEC: DELAYED_RELEASE_TABLET | ORAL | 0 refills | Status: DC

## 2017-09-19 MED ORDER — FAMOTIDINE 20 MG PO TABS: 20.0000 mg | ORAL_TABLET | Freq: Every day | ORAL | 0 refills | Status: DC

## 2017-09-30 ENCOUNTER — Other Ambulatory Visit: Payer: Self-pay | Admitting: Pharmacist

## 2017-09-30 ENCOUNTER — Telehealth: Payer: Self-pay | Admitting: Family Medicine

## 2017-09-30 NOTE — Patient Outreach (Signed)
Incoming call from [name] in response to the [facility] Medication Adherence Campaign. Speak with patient. HIPAA identifiers verified and verbal consent received.  Ms. Chismar reports that she has been taking her atorvastatin daily as directed. Reports that earlier in the year she had stopped her atorvastatin as she was having a gastrointestinal issue that was aggravated by several of her medications. Reports that this gastrointestinal issue was resolved with a procedure after a couple of months. However, patient reports that she accumulated extra medication.  Reports that she recently received a message from [name] indicating that the pharmacy was going to contact her provider for further refills of several of her medications. However, reports that she has not heard anything further. Reports that she has not been seen by her provider this year and believes that she will need to be seen for further refills. Advise patient to call today to request an appointment and to request that a refill of her medications be called into [name] to last her until the appointment.  PLAN  Ms. Moye reports that she will call her doctor's office now to schedule and appointment and request these refills.  [name], [name], [facility] [phone]

## 2017-09-30 NOTE — Telephone Encounter (Signed)
Copied from [place] [id number]. Topic: Quick Communication - See Telephone Encounter >> Sep 30, 2017  2:12 PM [name] wrote: CRM for notification. See Telephone encounter for:  Please call in one refill of atorvastatin and if appt is needed please schedule.  Patient Outreach Telephone Open    09/30/2017 [name], [facility]  Pharmacist  [name]    Reason for Visit  Conversation: [name]  (Newest Message First)  [name]      09/30/17 1:45 PM  Note     Incoming call from Alison Brooks in response to the [name] Medication Adherence Campaign. Speak with patient. HIPAA identifiers verified and verbal consent received.  Alison Brooks reports that she has been taking her atorvastatin daily as directed. Reports that earlier in the year she had stopped her atorvastatin as she was having a gastrointestinal issue that was aggravated by several of her medications. Reports that this gastrointestinal issue was resolved with a procedure after a couple of months. However, patient reports that she accumulated extra medication.  Reports that she recently received a message from [name] indicating that the pharmacy was going to contact her provider for further refills of several of her medications. However, reports that she has not heard anything further. Reports that she has not been seen by her provider this year and believes that she will need to be seen for further refills. Advise patient to call today to request an appointment and to request that a refill of her medications be called into [name] to last her until the appointment.  PLAN  Alison Brooks reports that she will call her doctor's office now to schedule and appointment and request these refills.  [name], [name], [name] [phone]         09/30/17 1:28 PM    [name], [name], [name] attempted to contact Alison Brooks "Alison Brooks" (Completed)  This encounter is not signed. The conversation may still be ongoing.  Additional Documentation   Flowsheets:  [facility] Intake Assessment,  Patient Engagement Tool   Encounter Info:  Billing Info,  History,  Allergies,  Detailed Report   Linked Episodes   [facility] CM PHARMACY From 09/30/2017 to 09/30/2017   Orders Placed   None  Medication Changes    None   Medication List Visit Diagnoses    None   Problem List   09/30/17.

## 2017-09-30 NOTE — Patient Outreach (Addendum)
Call patient's PCP, Dr. Ansel Bong, office to request that provider call in a refill of atorvastatin for the patient to [name]. Request that if patient is due for an appointment that office call patient to schedule, but to also call in refill to last patient until appointment. Leave message with Anne Ng in the office.  [name], [name], [place] [facility] [phone]

## 2017-10-04 NOTE — Telephone Encounter (Signed)
Copied from [place]. Topic: Quick Communication - See Telephone Encounter >> Oct 04, 2017  2:00 PM Synthia Innocent wrote: CRM for notification. See Telephone encounter for:  Requesting refill on: famotidine (PEPCID) 20 MG tablet  pantoprazole (PROTONIX) 40 MG tablet  baclofen (LIORESAL) 10 MG tablet  FLUoxetine (PROZAC) 40 MG capsule  atorvastatin (LIPITOR) 40 MG tablet  topiramate (TOPAMAX) 200 MG tablet  allopurinol (ZYLOPRIM) 100 MG tablet  amLODipine (NORVASC) 2.5 MG tablet  oxybutynin (DITROPAN) 5 MG tablet  [facility], patient states she has already contacted pharmacy 10/04/17.

## 2017-10-05 ENCOUNTER — Other Ambulatory Visit: Payer: Self-pay

## 2017-10-05 NOTE — Telephone Encounter (Signed)
FYI - Patient has been scheduled for an appointment on 10/11/2017.  No refills have been sent in.

## 2017-10-05 NOTE — Telephone Encounter (Signed)
Patient has not had a with Dr. Yong Channel.  Last saw Almira Coaster on 11/25/16 for acute issue.  Attempted to call patient to schedule with no answer, left voicemail advising patient to call back to schedule an appoinment  / I did not process any refills as patient has not seen her PCP within 1 year.

## 2017-10-07 ENCOUNTER — Ambulatory Visit: Payer: Medicare Other | Admitting: Family Medicine

## 2017-10-11 ENCOUNTER — Ambulatory Visit (INDEPENDENT_AMBULATORY_CARE_PROVIDER_SITE_OTHER): Payer: Medicare Other | Admitting: Family Medicine

## 2017-10-11 ENCOUNTER — Encounter: Payer: Self-pay | Admitting: Family Medicine

## 2017-10-11 VITALS — BP 124/76 | HR 84 | Temp 98.2°F | Ht 62.0 in | Wt 168.6 lb

## 2017-10-11 DIAGNOSIS — G2581 Restless legs syndrome: Secondary | ICD-10-CM | POA: Diagnosis not present

## 2017-10-11 DIAGNOSIS — F331 Major depressive disorder, recurrent, moderate: Secondary | ICD-10-CM | POA: Diagnosis not present

## 2017-10-11 DIAGNOSIS — M10072 Idiopathic gout, left ankle and foot: Secondary | ICD-10-CM | POA: Diagnosis not present

## 2017-10-11 DIAGNOSIS — I10 Essential (primary) hypertension: Secondary | ICD-10-CM | POA: Diagnosis not present

## 2017-10-11 DIAGNOSIS — N3010 Interstitial cystitis (chronic) without hematuria: Secondary | ICD-10-CM

## 2017-10-11 DIAGNOSIS — K219 Gastro-esophageal reflux disease without esophagitis: Secondary | ICD-10-CM

## 2017-10-11 DIAGNOSIS — E785 Hyperlipidemia, unspecified: Secondary | ICD-10-CM | POA: Diagnosis not present

## 2017-10-11 DIAGNOSIS — E538 Deficiency of other specified B group vitamins: Secondary | ICD-10-CM | POA: Diagnosis not present

## 2017-10-11 LAB — COMPREHENSIVE METABOLIC PANEL
ALT: 25 U/L (ref 0–35)
AST: 21 U/L (ref 0–37)
Albumin: 4.5 g/dL (ref 3.5–5.2)
Alkaline Phosphatase: 86 U/L (ref 39–117)
BUN: 16 mg/dL (ref 6–23)
CHLORIDE: 105 meq/L (ref 96–112)
CO2: 29 meq/L (ref 19–32)
Calcium: 9.7 mg/dL (ref 8.4–10.5)
Creatinine, Ser: 0.73 mg/dL (ref 0.40–1.20)
GFR: 85.36 mL/min (ref >60.00)
GLUCOSE: 84 mg/dL (ref 70–99)
Potassium: 4.6 mEq/L (ref 3.5–5.1)
Sodium: 141 mEq/L (ref 135–145)
Total Bilirubin: 0.6 mg/dL (ref 0.2–1.2)
Total Protein: 6.8 g/dL (ref 6.0–8.3)

## 2017-10-11 LAB — LIPID PANEL
CHOL/HDL RATIO: 5
Cholesterol: 256 mg/dL — ABNORMAL HIGH (ref 0–200)
HDL: 48.2 mg/dL (ref >39.00)
LDL CALC: 172 mg/dL — AB (ref 0–99)
NonHDL: 207.89
TRIGLYCERIDES: 178 mg/dL — AB (ref 0.0–149.0)
VLDL: 35.6 mg/dL (ref 0.0–40.0)

## 2017-10-11 LAB — CBC
HCT: 44.7 % (ref 36.0–46.0)
HEMOGLOBIN: 14.9 g/dL (ref 12.0–15.0)
MCHC: 33.3 g/dL (ref 30.0–36.0)
MCV: 94.3 fl (ref 78.0–100.0)
PLATELETS: 272 10*3/uL (ref 150.0–400.0)
RBC: 4.74 Mil/uL (ref 3.87–5.11)
RDW: 14.5 % (ref 11.5–15.5)
WBC: 8 10*3/uL (ref 4.0–10.5)

## 2017-10-11 LAB — VITAMIN B12: Vitamin B-12: 505 pg/mL (ref 211–911)

## 2017-10-11 LAB — URIC ACID: Uric Acid, Serum: 4.3 mg/dL (ref 2.4–7.0)

## 2017-10-11 MED ORDER — CYANOCOBALAMIN 1000 MCG/ML IJ SOLN: INTRAMUSCULAR | 0 refills | Status: DC

## 2017-10-11 MED ORDER — PANTOPRAZOLE SODIUM 40 MG PO TBEC: DELAYED_RELEASE_TABLET | ORAL | 3 refills | Status: DC

## 2017-10-11 MED ORDER — CYANOCOBALAMIN 1000 MCG/ML IJ SOLN: INTRAMUSCULAR | 3 refills | Status: DC

## 2017-10-11 MED ORDER — ESCITALOPRAM OXALATE 10 MG PO TABS: 10.0000 mg | ORAL_TABLET | Freq: Every day | ORAL | 5 refills | Status: DC

## 2017-10-11 MED ORDER — OXYBUTYNIN CHLORIDE 5 MG PO TABS: ORAL_TABLET | ORAL | 3 refills | Status: DC

## 2017-10-11 MED ORDER — TOPIRAMATE 100 MG PO TABS: 100.0000 mg | ORAL_TABLET | Freq: Every day | ORAL | 3 refills | Status: DC

## 2017-10-11 MED ORDER — ATORVASTATIN CALCIUM 40 MG PO TABS: 40.0000 mg | ORAL_TABLET | Freq: Every day | ORAL | 3 refills | Status: DC

## 2017-10-11 MED ORDER — AMLODIPINE BESYLATE 2.5 MG PO TABS: 2.5000 mg | ORAL_TABLET | Freq: Every day | ORAL | 3 refills | Status: DC

## 2017-10-11 NOTE — Assessment & Plan Note (Signed)
S:Off prozac- not taking it - would get numbness  Around lip and tongue. No swelling. Slight flutter in heart. Same thing with topamax so only taking half and doesn't happen (for restless legs). Last time tried amitriptyline years ago outside of prozac.  A/P: very poor control today with PHQ9 of 19. No SI. Will start lexapro 10mg  with 6 week follow up.

## 2017-10-11 NOTE — Assessment & Plan Note (Signed)
Numbness in lips and tongue also reported with topamax 200mg . No issues with 100mg - reduced dose for patient

## 2017-10-11 NOTE — Assessment & Plan Note (Signed)
S: had some leftover b12 but she is running out. Asks for refills for home injections- wants to see If individual vials or larger vial cheaper A/P: printed rx for her as she thinks she can get at [facility]. b12 ok today Lab Results  Component Value Date   VITAMINB12 505 10/11/2017

## 2017-10-11 NOTE — Assessment & Plan Note (Signed)
  S: well controlled on atorvastatin 40mg  previously. No myalgias.  Lab Results  Component Value Date   CHOL 256 (H) 10/11/2017   HDL 48.20 10/11/2017   LDLCALC 172 (H) 10/11/2017   LDLDIRECT 171.0 04/10/2015   TRIG 178.0 (H) 10/11/2017   CHOLHDL 5 10/11/2017   A/P: reaching out to patient- appears she stopped taking her medication- she will need to restart- she reported being on meds at time of visit but based on labs highly unlikely

## 2017-10-11 NOTE — Assessment & Plan Note (Signed)
OAB like symptoms controlled with oxybutynin. No recent other issues from her cystitis

## 2017-10-11 NOTE — Progress Notes (Signed)
Subjective:  Alison Brooks is a 61 y.o. year old very pleasant female patient who presents for/with See problem oriented charting ROS- no chest pain. No shortness of breath.  Admits to depressed mood. No SI.   Past Medical History-  Patient Active Problem List   Diagnosis Date Noted  . 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 Gout 06/28/2014    Priority: Medium  . Vitamin B12 deficiency 06/28/2014    Priority: Medium  . Multiple pulmonary nodules 06/28/2014    Priority: Medium  . Essential hypertension 05/17/2014    Priority: Medium  . RESTLESS LEG SYNDROME, SEVERE 03/14/2009    Priority: Medium  . Chronic interstitial cystitis 01/20/2009    Priority: Medium  . Depression, major, recurrent ([place]) 03/19/2008    Priority: Medium  . Hyperlipidemia 02/17/2008    Priority: Medium  . Former smoker 12/21/2007    Priority: Medium  . FIBROMYALGIA 04/21/2007    Priority: Medium  . Obesity (BMI 30-39.9) 01/14/2014    Priority: Low  . Lateral epicondylitis (tennis elbow) 01/02/2014    Priority: Low  . GERD (gastroesophageal reflux disease) 03/26/2013    Priority: Low  . LEG CRAMPS 05/16/2009    Priority: Low  . TUBULOVILLOUS ADENOMA, COLON 02/17/2008    Priority: Low  . NEPHROLITHIASIS 02/17/2008    Priority: Low  . MIGRAINE, CLASSICAL W/O INTRACTABLE MIGRAINE 06/09/2007    Priority: Low  . SYMPTOM, MEMORY LOSS 06/09/2007    Priority: Low  . Vertigo 11/06/2016  . Dyspnea 11/02/2014  . Upper airway cough syndrome 11/02/2014    Medications- reviewed and updated Current Outpatient Medications  Medication Sig Dispense Refill  . allopurinol (ZYLOPRIM) 100 MG tablet TAKE 1 TABLET BY MOUTH  DAILY 90 tablet 1  . amLODipine (NORVASC) 2.5 MG tablet Take 1 tablet (2.5 mg total) by mouth daily. 30 tablet 5  . atorvastatin (LIPITOR) 40 MG tablet TAKE 1 TABLET BY MOUTH  DAILY 90 tablet 1  . baclofen (LIORESAL) 10 MG tablet Take 10  mg by mouth as needed.    . famotidine (PEPCID) 20 MG tablet Take 1 tablet (20 mg total) by mouth at bedtime. 90 tablet 0  . FLUoxetine (PROZAC) 40 MG capsule TAKE 1 CAPSULE BY MOUTH  DAILY 90 capsule 1  . oxybutynin (DITROPAN) 5 MG tablet TAKE 1 TABLET BY MOUTH 2  TIMES DAILY AS NEEDED FOR  BLADDER SPASMS. 180 tablet 1  . pantoprazole (PROTONIX) 40 MG tablet TAKE 1 TABLET BY MOUTH  DAILY. TAKE 30-60 MIN  BEFORE FIRST MEAL OF THE  DAY 90 tablet 0  . topiramate (TOPAMAX) 200 MG tablet TAKE 1 TABLET BY MOUTH  DAILY 90 tablet 1   No current facility-administered medications for this visit.     Objective: BP 124/76 (BP Location: Left Arm, Patient Position: Sitting, Cuff Size: Large)   Pulse 84   Temp 98.2 F (36.8 C) (Oral)   Ht 5\' 2"  (1.575 m)   Wt 168 lb 9.6 oz (76.5 kg)   SpO2 98%   BMI 30.84 kg/m  Gen: NAD, resting comfortably CV: RRR no murmurs rubs or gallops Lungs: CTAB no crackles, wheeze, rhonchi Ext: no edema Skin: warm, dry  Assessment/Plan:  Essential hypertension S: controlled on amlodipine 2.5mg .  Lisinopril and propranolol stopped with respiratory complaints. bystolic made legs feel heavy.  BP Readings from Last 3 Encounters:  10/11/17 124/76  11/25/16 140/90  11/05/16 (!) 148/87  A/P:  We discussed blood pressure goal of <140/90. Continue current meds  Hyperlipidemia  S: well controlled on atorvastatin 40mg  previously. No myalgias.  Lab Results  Component Value Date   CHOL 256 (H) 10/11/2017   HDL 48.20 10/11/2017   LDLCALC 172 (H) 10/11/2017   LDLDIRECT 171.0 04/10/2015   TRIG 178.0 (H) 10/11/2017   CHOLHDL 5 10/11/2017   A/P: reaching out to patient- appears she stopped taking her medication- she will need to restart- she reported being on meds at time of visit but based on labs highly unlikely  Gout S: no flare ups of gout on allopurinol 100mg . Prior on colchicine BID had 2-3 flares a year Lab Results  Component Value Date   LABURIC 4.3 10/11/2017   A/P: updated uric acid- doing well  Depression, major, recurrent ([facility]) S:Off prozac- not taking it - would get numbness  Around lip and tongue. No swelling. Slight flutter in heart. Same thing with topamax so only taking half and doesn't happen (for restless legs). Last time tried amitriptyline years ago outside of prozac.  A/P: very poor control today with PHQ9 of 19. No SI. Will start lexapro 10mg  with 6 week follow up.   GERD (gastroesophageal reflux disease) S: GERD controlled on prtonix 40mg  daily with pepcid in evening A/P: will have her trial without pepcid  RESTLESS LEG SYNDROME, SEVERE Numbness in lips and tongue also reported with topamax 200mg . No issues with 100mg - reduced dose for patient  Vitamin B12 deficiency S: had some leftover b12 but she is running out. Asks for refills for home injections- wants to see If individual vials or larger vial cheaper A/P: printed rx for her as she thinks she can get at [facility]. b12 ok today Lab Results  Component Value Date   VITAMINB12 505 10/11/2017     Chronic interstitial cystitis OAB like symptoms controlled with oxybutynin. No recent other issues from her cystitis   Future Appointments  Date Time Provider [place]  11/22/2017 10:15 AM [name], MD [facility]   Orders Placed This Encounter  Procedures  . Vitamin B12  . Uric acid  . CBC    [place]  . Comprehensive metabolic panel    [place]    Order Specific Question:   Has the patient fasted?    Answer:   No  . Lipid panel    [place]    Order Specific Question:   Has the patient fasted?    Answer:   No    Meds ordered this encounter  Medications  . topiramate (TOPAMAX) 100 MG tablet    Sig: Take 1 tablet (100 mg total) by mouth daily.    Dispense:  90 tablet    Refill:  3  . pantoprazole (PROTONIX) 40 MG tablet    Sig: TAKE 1 TABLET BY MOUTH  DAILY. TAKE 30-60 MIN  BEFORE FIRST MEAL OF THE  DAY    Dispense:  90 tablet    Refill:  3  .  oxybutynin (DITROPAN) 5 MG tablet    Sig: TAKE 1 TABLET BY MOUTH 2  TIMES DAILY AS NEEDED FOR  BLADDER SPASMS.    Dispense:  180 tablet    Refill:  3  . atorvastatin (LIPITOR) 40 MG tablet    Sig: Take 1 tablet (40 mg total) by mouth daily.    Dispense:  90 tablet    Refill:  3  . amLODipine (NORVASC) 2.5 MG tablet    Sig: Take 1 tablet (2.5 mg total) by  mouth daily.    Dispense:  90 tablet    Refill:  3  . escitalopram (LEXAPRO) 10 MG tablet    Sig: Take 1 tablet (10 mg total) by mouth daily.    Dispense:  30 tablet    Refill:  5  . DISCONTD: cyanocobalamin (,VITAMIN B-12,) 1000 MCG/ML injection    Sig: 1000 mcg (1 mg) injection once per per month.    Dispense:  3 mL    Refill:  3  . cyanocobalamin (,VITAMIN B-12,) 1000 MCG/ML injection    Sig: 1000 mcg (1 mg) injection once per per month.    Dispense:  30 mL    Refill:  0    Return precautions advised.  Garret Reddish, MD

## 2017-10-11 NOTE — Assessment & Plan Note (Signed)
S: controlled on amlodipine 2.5mg .  Lisinopril and propranolol stopped with respiratory complaints. bystolic made legs feel heavy.  BP Readings from Last 3 Encounters:  10/11/17 124/76  11/25/16 140/90  11/05/16 (!) 148/87  A/P: We discussed blood pressure goal of <140/90. Continue current meds

## 2017-10-11 NOTE — Assessment & Plan Note (Signed)
S: no flare ups of gout on allopurinol 100mg . Prior on colchicine BID had 2-3 flares a year Lab Results  Component Value Date   LABURIC 4.3 10/11/2017  A/P: updated uric acid- doing well

## 2017-10-11 NOTE — Assessment & Plan Note (Signed)
S: GERD controlled on prtonix 40mg  daily with pepcid in evening A/P: will have her trial without pepcid

## 2017-10-11 NOTE — Patient Instructions (Addendum)
Please stop by lab before you go  Hopeful you can get b12 filled  Start lexapro 10mg . Follow up 6 weeks    Taking the medicine as directed and not missing any doses is one of the best things you can do to treat your depression.  Here are some things to keep in mind:  1) Side effects (stomach upset, some increased anxiety) may happen before you notice a benefit.  These side effects typically go away over time. 2) Changes to your dose of medicine or a change in medication all together is sometimes necessary 3) Most people need to be on medication at least 6-12 months 4) Many people will notice an improvement within two weeks but the full effect of the medication can take up to 4-6 weeks 5) Stopping the medication when you start feeling better often results in a return of symptoms 6) If you start having thoughts of hurting yourself or others after starting this medicine, call our office immediately at [phone] or seek care through 911.

## 2017-10-12 NOTE — Telephone Encounter (Signed)
Patient was seen in the office on 10/11/2017 and her medications were refilled at that time.

## 2017-10-14 ENCOUNTER — Other Ambulatory Visit: Payer: Self-pay

## 2017-10-14 MED ORDER — ROSUVASTATIN CALCIUM 40 MG PO TABS: 40.0000 mg | ORAL_TABLET | Freq: Every day | ORAL | 3 refills | Status: DC

## 2017-10-17 ENCOUNTER — Other Ambulatory Visit: Payer: Self-pay | Admitting: Family Medicine

## 2017-10-17 NOTE — Telephone Encounter (Signed)
Note from 10/11/17:  Depression, major, recurrent ([place]) S:Off prozac- not taking it - would get numbness  Around lip and tongue. No swelling. Slight flutter in heart. Same thing with topamax so only taking half and doesn't happen (for restless legs). Last time tried amitriptyline years ago outside of prozac.  A/P: very poor control today with PHQ9 of 19. No SI. Will start lexapro 10mg  with 6 week follow up.

## 2017-10-19 ENCOUNTER — Telehealth: Payer: Self-pay | Admitting: Family Medicine

## 2017-10-19 NOTE — Telephone Encounter (Signed)
Please advise on medication question below.  Copied from [place] [phone]. Topic: General - Other >> Oct 19, 2017  2:22 PM Valla Leaver wrote: Reason for CRM: [facility] pharm calling to find out if patient was prescribed Lexapro 10mg  and Prozac 40mg  by Dr. Yong Channel? Wants to know if patient is supposed to discontinue prozac?

## 2017-10-21 NOTE — Telephone Encounter (Signed)
Called [facility] and let them know that Prozac had been discontinued as of 10/11/17. They updated their records and will ship out the Lexapro

## 2017-11-02 DIAGNOSIS — L820 Inflamed seborrheic keratosis: Secondary | ICD-10-CM | POA: Diagnosis not present

## 2017-11-10 ENCOUNTER — Telehealth: Payer: Self-pay | Admitting: Family Medicine

## 2017-11-10 NOTE — Telephone Encounter (Signed)
Copied from [place] [phone]. Topic: Quick Communication - See Telephone Encounter >> Nov 10, 2017  2:46 PM [place], Laban Emperor wrote: CRM for notification. See Telephone encounter for:  Pt would like to know if Dr. Yong Channel can call her something in for her back pain. She states he knows all about her back pain and has seen her for this before.  [place], [place] (Phone) [phone] (Fax)   11/10/17.

## 2017-11-11 NOTE — Telephone Encounter (Signed)
Spoke to pt, told her Dr. Yong Channel would like her to come in to discuss options. Pt said she does not have a ride. Told pt she can make an appt next week to see him. Pt said the pain will be gone by then. Told pt sorry he wants to see you, please call back and schedule appt. Pt verbalized understanding.

## 2017-11-11 NOTE — Telephone Encounter (Signed)
Patient is calling back in regards to this.  CB# - [phone]

## 2017-11-11 NOTE — Telephone Encounter (Signed)
Duplicate

## 2017-11-11 NOTE — Telephone Encounter (Signed)
See note

## 2017-11-14 ENCOUNTER — Ambulatory Visit (INDEPENDENT_AMBULATORY_CARE_PROVIDER_SITE_OTHER): Payer: Medicare Other

## 2017-11-14 ENCOUNTER — Encounter: Payer: Self-pay | Admitting: Family Medicine

## 2017-11-14 ENCOUNTER — Ambulatory Visit (INDEPENDENT_AMBULATORY_CARE_PROVIDER_SITE_OTHER): Payer: Medicare Other | Admitting: Family Medicine

## 2017-11-14 VITALS — BP 128/72 | HR 75 | Temp 98.0°F | Ht 62.0 in | Wt 164.8 lb

## 2017-11-14 DIAGNOSIS — M5441 Lumbago with sciatica, right side: Secondary | ICD-10-CM

## 2017-11-14 DIAGNOSIS — R109 Unspecified abdominal pain: Secondary | ICD-10-CM

## 2017-11-14 DIAGNOSIS — E785 Hyperlipidemia, unspecified: Secondary | ICD-10-CM | POA: Diagnosis not present

## 2017-11-14 DIAGNOSIS — R319 Hematuria, unspecified: Secondary | ICD-10-CM | POA: Diagnosis not present

## 2017-11-14 DIAGNOSIS — G8929 Other chronic pain: Secondary | ICD-10-CM

## 2017-11-14 DIAGNOSIS — F331 Major depressive disorder, recurrent, moderate: Secondary | ICD-10-CM | POA: Diagnosis not present

## 2017-11-14 DIAGNOSIS — I10 Essential (primary) hypertension: Secondary | ICD-10-CM | POA: Diagnosis not present

## 2017-11-14 LAB — URINALYSIS, MICROSCOPIC ONLY

## 2017-11-14 LAB — POC URINALSYSI DIPSTICK (AUTOMATED)
BILIRUBIN UA: NEGATIVE
Glucose, UA: NEGATIVE
KETONES UA: NEGATIVE
LEUKOCYTES UA: NEGATIVE
NITRITE UA: NEGATIVE
PROTEIN UA: NEGATIVE
Spec Grav, UA: 1.02 (ref 1.010–1.025)
Urobilinogen, UA: 0.2 E.U./dL
pH, UA: 5 (ref 5.0–8.0)

## 2017-11-14 LAB — LDL CHOLESTEROL, DIRECT: LDL DIRECT: 95 mg/dL

## 2017-11-14 MED ORDER — DICLOFENAC SODIUM 75 MG PO TBEC: 75.0000 mg | DELAYED_RELEASE_TABLET | Freq: Two times a day (BID) | ORAL | 1 refills | Status: DC

## 2017-11-14 MED ORDER — ESCITALOPRAM OXALATE 20 MG PO TABS: 20.0000 mg | ORAL_TABLET | Freq: Every day | ORAL | 3 refills | Status: DC

## 2017-11-14 NOTE — Assessment & Plan Note (Signed)
S: controlled on amlodipine 2.5mg  BP Readings from Last 3 Encounters:  11/14/17 128/72  10/11/17 124/76  11/25/16 140/90  A/P: We discussed blood pressure goal of <140/90. Continue current meds:  Suspect BP can tolerate short term nsaids- as per back pain

## 2017-11-14 NOTE — Assessment & Plan Note (Addendum)
+  right flank pain S: Patient complains of low back pain for the last 5 days. On Wednesday bent over and felt acute pain in low back across the entire low back and then down into legs- worse in the left. Did ice/heat/hot bath and took baclofen. Took ibuprofen once or twice but bothers her stomach.  No history stomach ulceration or bleeding issues. First 3 days were rather intense - about 80-85% better now  She also has right flank pain over the last month on right side- history of kidney stones- started straining her urine (last year had to have surgical procedure). Intermittent pain with this but getting more frequent.  Small blood in UA. Also has urine odor.   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 Had also been on methadone in the past but off as of 2016.   Did have one seizure after an interstitial cystitis and they thought medication interaction with prozac.  A/P: I asked patient to come in as she does have history of chronic pain and prior managed primarily by specialists as we did not have a plan for acute care.   We agreed to try diclofenac for 2-5 days (adding zantac or pepcid to her PPI given stomach irritation) and if not improvign use Prednisone as back up. She already has baclofen on hand  Could use tramadol if needed (one provoked seizure it sounds like in past) but she wants to avoid narcotics with methadone history which I think is ideal  For right flank pain- UA with hematuria- will get culture and microscopic to rule out UTI. Also get x-ray to evaluate for kidney stones. Refer back to urology if stones found- could be related to her back issues though

## 2017-11-14 NOTE — Progress Notes (Signed)
Subjective:  Alison Brooks is a 62 y.o. year old very pleasant female patient who presents for/with See problem oriented charting ROS- back pain. No falls or injury. Has had some pain into legs but without weakness. No chest pain or shrotness of breath.    Past Medical History-  Patient Active Problem List   Diagnosis Date Noted  . 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 Gout 06/28/2014    Priority: Medium  . Vitamin B12 deficiency 06/28/2014    Priority: Medium  . Multiple pulmonary nodules 06/28/2014    Priority: Medium  . Essential hypertension 05/17/2014    Priority: Medium  . RESTLESS LEG SYNDROME, SEVERE 03/14/2009    Priority: Medium  . Chronic interstitial cystitis 01/20/2009    Priority: Medium  . Depression, major, recurrent ([place]) 03/19/2008    Priority: Medium  . Hyperlipidemia 02/17/2008    Priority: Medium  . Former smoker 12/21/2007    Priority: Medium  . FIBROMYALGIA 04/21/2007    Priority: Medium  . Obesity (BMI 30-39.9) 01/14/2014    Priority: Low  . Lateral epicondylitis (tennis elbow) 01/02/2014    Priority: Low  . GERD (gastroesophageal reflux disease) 03/26/2013    Priority: Low  . LEG CRAMPS 05/16/2009    Priority: Low  . TUBULOVILLOUS ADENOMA, COLON 02/17/2008    Priority: Low  . NEPHROLITHIASIS 02/17/2008    Priority: Low  . MIGRAINE, CLASSICAL W/O INTRACTABLE MIGRAINE 06/09/2007    Priority: Low  . SYMPTOM, MEMORY LOSS 06/09/2007    Priority: Low  . Vertigo 11/06/2016  . Dyspnea 11/02/2014  . Upper airway cough syndrome 11/02/2014    Medications- reviewed and updated Current Outpatient Medications  Medication Sig Dispense Refill  . allopurinol (ZYLOPRIM) 100 MG tablet TAKE 1 TABLET BY MOUTH  DAILY 90 tablet 1  . amLODipine (NORVASC) 2.5 MG tablet Take 1 tablet (2.5 mg total) by mouth daily. 90 tablet 3  . baclofen (LIORESAL) 10 MG tablet Take 10 mg by mouth as needed.    .  cyanocobalamin (,VITAMIN B-12,) 1000 MCG/ML injection 1000 mcg (1 mg) injection once per per month. 30 mL 0  . escitalopram (LEXAPRO) 10 MG tablet Take 1 tablet (10 mg total) by mouth daily. 30 tablet 5  . oxybutynin (DITROPAN) 5 MG tablet TAKE 1 TABLET BY MOUTH 2  TIMES DAILY AS NEEDED FOR  BLADDER SPASMS. 180 tablet 3  . pantoprazole (PROTONIX) 40 MG tablet TAKE 1 TABLET BY MOUTH  DAILY. TAKE 30-60 MIN  BEFORE FIRST MEAL OF THE  DAY 90 tablet 3  . rosuvastatin (CRESTOR) 40 MG tablet Take 1 tablet (40 mg total) by mouth daily. 90 tablet 3  . topiramate (TOPAMAX) 100 MG tablet Take 1 tablet (100 mg total) by mouth daily. 90 tablet 3   No current facility-administered medications for this visit.     Objective: BP 128/72 (BP Location: Left Arm, Patient Position: Sitting, Cuff Size: Large)   Pulse 75   Temp 98 F (36.7 C) (Oral)   Ht 5\' 2"  (1.575 m)   Wt 164 lb 12.8 oz (74.8 kg)   SpO2 99%   BMI 30.14 kg/m  Gen: NAD,appears uncomfortable CV: RRR no murmurs rubs or gallops Lungs: CTAB no crackles, wheeze, rhonchi Abdomen: soft/nontender/nondistended/normal bowel sounds. No rebound or guarding.  Ext: no edema Skin: warm, dry MSK: moves gingerly- appears in worsened discomfort with moving. Some pain along paraspinous muscles in lumbar spine. Also reports  flank pain with palpation on right  Results for orders placed or performed in visit on 11/14/17 (from the past 24 hour(s))  POCT Urinalysis Dipstick (Automated)     Status: None   Collection Time: 11/14/17 10:23 AM  Result Value Ref Range   Color, UA Yellow    Clarity, UA Clear    Glucose, UA Negative    Bilirubin, UA Negative    Ketones, UA Negative    Spec Grav, UA 1.020 1.010 - 1.025   Blood, UA 1+(Small)    pH, UA 5.0 5.0 - 8.0   Protein, UA Negative    Urobilinogen, UA 0.2 0.2 or 1.0 E.U./dL   Nitrite, UA Negative    Leukocytes, UA Negative Negative    Assessment/Plan:  Chronic Low Back Pain with spinal cord implant and  on methadone +right flank pain S: Patient complains of low back pain for the last 5 days. On Wednesday bent over and felt acute pain in low back across the entire low back and then down into legs- worse in the left. Did ice/heat/hot bath and took baclofen. Took ibuprofen once or twice but bothers her stomach.  No history stomach ulceration or bleeding issues. First 3 days were rather intense - about 80-85% better now  She also has right flank pain over the last month on right side- history of kidney stones- started straining her urine (last year had to have surgical procedure). Intermittent pain with this but getting more frequent.  Small blood in UA. Also has urine odor.   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 Had also been on methadone in the past but off as of 2016.   Did have one seizure after an interstitial cystitis and they thought medication interaction with prozac.  A/P: I asked patient to come in as she does have history of chronic pain and prior managed primarily by specialists as we did not have a plan for acute care.   We agreed to try diclofenac for 2-5 days (adding zantac or pepcid to her PPI given stomach irritation) and if not improvign use Prednisone as back up. She already has baclofen on hand  Could use tramadol if needed (one provoked seizure it sounds like in past) but she wants to avoid narcotics with methadone history which I think is ideal  For right flank pain- UA with hematuria- will get culture and microscopic to rule out UTI. Also get x-ray to evaluate for kidney stones. Refer back to urology if stones found- could be related to her back issues though  Depression, major, recurrent ([place]) S: improved control today with phq9 to 12- patient states she feels MUCH better though.  A/P: titrate lexapro to 20mg  with 10  week follow up with sooner precautions in case worsening symptoms or SI.    Hyperlipidemia S: poorly controlled on about 5x a week atorvastation 40mg  so increased to rosuvastatin 40mg . No myalgias.  Lab Results  Component Value Date   CHOL 256 (H) 10/11/2017   HDL 48.20 10/11/2017   LDLCALC 172 (H) 10/11/2017   LDLDIRECT 171.0 04/10/2015   TRIG 178.0 (H) 10/11/2017   CHOLHDL 5 10/11/2017   A/P: update LDL today  Essential hypertension S: controlled on amlodipine 2.5mg  BP Readings from Last 3 Encounters:  11/14/17 128/72  10/11/17 124/76  11/25/16 140/90  A/P: We discussed blood pressure goal of <  140/90. Continue current meds:  Suspect BP can tolerate short term nsaids- as per back pain    Future Appointments  Date Time Provider [place]  01/24/2018 11:15 AM [name], MD [facility]   Return in about 10 weeks (around 01/23/2018).  Lab/Order associations: Flank pain - Plan: POCT Urinalysis Dipstick (Automated), Urine Culture, Urine Culture, Urine Microscopic, DG Abd 1 View  Hematuria, unspecified type - Plan: Urine Microscopic  Hyperlipidemia, unspecified hyperlipidemia type - Plan: LDL cholesterol, direct  Meds ordered this encounter  Medications  . diclofenac (VOLTAREN) 75 MG EC tablet    Sig: Take 1 tablet (75 mg total) by mouth 2 (two) times daily.    Dispense:  30 tablet    Refill:  1  . escitalopram (LEXAPRO) 20 MG tablet    Sig: Take 1 tablet (20 mg total) by mouth daily.    Dispense:  90 tablet    Refill:  3    Return precautions advised.  Garret Reddish, MD

## 2017-11-14 NOTE — Assessment & Plan Note (Signed)
S: improved control today with phq9 to 12- patient states she feels MUCH better though.  A/P: titrate lexapro to 20mg  with 10 week follow up with sooner precautions in case worsening symptoms or SI.

## 2017-11-14 NOTE — Assessment & Plan Note (Signed)
S: poorly controlled on about 5x a week atorvastation 40mg  so increased to rosuvastatin 40mg . No myalgias.  Lab Results  Component Value Date   CHOL 256 (H) 10/11/2017   HDL 48.20 10/11/2017   LDLCALC 172 (H) 10/11/2017   LDLDIRECT 171.0 04/10/2015   TRIG 178.0 (H) 10/11/2017   CHOLHDL 5 10/11/2017   A/P: update LDL today

## 2017-11-14 NOTE — Patient Instructions (Addendum)
For future back pain flares or if you want to try a few days now- can use diclofenac 75mg  up to twice a day. Usually 5-7 days maximum. I would try zantac/ranitidine or pepcid before meals when you take this to help protect your stomach  I also made a note to myself that if you are not improving in 2-5 days on the diclofenac to consider prednisone for flares.   Stop by x-ray before you leave. Also they will check your cholesterol in lab.   Cancel your visit on the 29th then schedule a 10 week follow up. We are here for you if you need to see [place] sooner. If any thoughts of self harm call us or 911 immediately

## 2017-11-15 LAB — URINE CULTURE
MICRO NUMBER: 90086019
SPECIMEN QUALITY:: ADEQUATE

## 2017-11-22 ENCOUNTER — Ambulatory Visit: Payer: Medicare Other | Admitting: Family Medicine

## 2017-11-25 ENCOUNTER — Other Ambulatory Visit: Payer: Self-pay | Admitting: Family Medicine

## 2017-12-16 ENCOUNTER — Encounter: Payer: Self-pay | Admitting: Nurse Practitioner

## 2017-12-16 ENCOUNTER — Ambulatory Visit: Payer: Medicare Other | Admitting: Nurse Practitioner

## 2017-12-16 VITALS — BP 142/70 | HR 64 | Ht 61.0 in | Wt 165.0 lb

## 2017-12-16 DIAGNOSIS — K5900 Constipation, unspecified: Secondary | ICD-10-CM

## 2017-12-16 MED ORDER — LUBIPROSTONE 8 MCG PO CAPS: 8.0000 ug | ORAL_CAPSULE | Freq: Two times a day (BID) | ORAL | 2 refills | Status: DC

## 2017-12-16 NOTE — Progress Notes (Signed)
      IMPRESSION and PLAN:     62 yo female with constipation. KUB late Jan showed marked bowel contents. Bowels purged but having recurrent constipation characterized by decreased urgency AND difficulty expelling stools -tried daily miralax for a few weeks without much improvement. Drinks adequate water and eats vegetables for fiber.  At this point I think it is worth a trial of Amitiza or similar medication.  -Amitiza 8 mcg twice daily with food -Follow-up with me in 3 weeks  HPI:    Chief Complaint: constipation   Patient is a 62 yo female here for evaluation of constipation. She is known to Dr. Henrene Pastor and had a complete colonoscopy with adequate prep in August 2014.  Polyp pathology compatible tubular adenoma without HGD.  Due for surveillance colonoscopy August 2019. No recent medication changes. Ditropan on home med list but hasn't taken it in months. Saw PCP late January with back / flank pain. KUB revealed marked amount of bowel contents. Bowels were purged but developed recurrent constipation within a few days. She tried taking daily miralax for a few weeks but didn't really make a difference. No nausea or vomiting.   ROS: no weight loss, weight stable. No urinary sx. No chest pain . No SOB.    Past Medical History:  Diagnosis Date  . Arthritis    back, neck  . Chronic back pain   . COPD (chronic obstructive pulmonary disease) ([place])   . Depression   . Fibromyalgia   . GERD (gastroesophageal reflux disease)   . Hematuria   . History of kidney stones   . History of seizure    x1 2007 post op (per pt neurologist work-up done ?mixture of anesthesia medication and prozac that pt had taken)  and has not any issues since   . History of squamous cell carcinoma excision   . Hyperlipidemia   . Hypertension   . IC (interstitial cystitis)   . Migraine   . Pulmonary nodules    multiple per ct -- monitored by pcp  . Right ureteral stone   . RLS (restless legs syndrome)      Patient's surgical history, family medical history, social history, medications and allergies were all reviewed in Epic     Physical Exam:     BP (!) 142/70 (BP Location: Left Arm, Patient Position: Sitting, Cuff Size: Normal)   Pulse 64   Ht 5\' 1"  (1.549 m) Comment: height measured without shoes  Wt 165 lb (74.8 kg)   BMI 31.18 kg/m   GENERAL:  White female in NAD PSYCH: :Pleasant, cooperative, normal affect EENT:  conjunctiva pink, mucous membranes moist, neck supple without masses CARDIAC:  RRR, no murmur heard, no  peripheral edema PULM: Normal respiratory effort, lungs CTA bilaterally, no wheezing ABDOMEN:  Nondistended, soft, nontender. No obvious masses,   normal bowel sounds SKIN:  turgor, no lesions seen Musculoskeletal:  Normal muscle tone, normal strength NEURO: Alert and oriented x 3, no focal neurologic deficits   Tye Savoy , NP 12/16/2017, 11:18 AM

## 2017-12-16 NOTE — Patient Instructions (Addendum)
If you are age 64 or older, your body mass index should be between 23-30. Your Body mass index is 31.18 kg/m. If this is out of the aforementioned range listed, please consider follow up with your Primary Care Provider.  If you are age 77 or younger, your body mass index should be between 19-25. Your Body mass index is 31.18 kg/m. If this is out of the aformentioned range listed, please consider follow up with your Primary Care Provider.   We have sent the following medications to your pharmacy for you to pick up at your convenience: Amitiza 8 mcg twice daily.  Use Glycerin suppositories as needed (over-the-counter).  Follow up with Tye Savoy, NP on January 04, 2018 at 10:30 am.  Thank you for choosing me and [facility].   Tye Savoy, NP

## 2017-12-17 ENCOUNTER — Encounter: Payer: Self-pay | Admitting: Nurse Practitioner

## 2017-12-19 NOTE — Progress Notes (Signed)
Assessment and plans reviewed  

## 2018-01-04 ENCOUNTER — Encounter: Payer: Self-pay | Admitting: Nurse Practitioner

## 2018-01-04 ENCOUNTER — Ambulatory Visit: Payer: Medicare Other | Admitting: Nurse Practitioner

## 2018-01-04 VITALS — BP 126/68 | HR 67 | Ht 61.0 in | Wt 165.0 lb

## 2018-01-04 DIAGNOSIS — K5900 Constipation, unspecified: Secondary | ICD-10-CM | POA: Diagnosis not present

## 2018-01-04 MED ORDER — LUBIPROSTONE 24 MCG PO CAPS: 24.0000 ug | ORAL_CAPSULE | Freq: Two times a day (BID) | ORAL | 3 refills | Status: DC

## 2018-01-04 NOTE — Progress Notes (Signed)
      IMPRESSION and PLAN:     1. 62 yo female with constipation refractory of hydration, high fiber diet and amitiza 8 mcg.   -Thyroid checked Nov 2018 through [facility] where she goes for weight loss. Apparently abnormal  thyroid studies but patient took labs to PCP who apparently didn't feel she was hypothyroid.   -purge bowel with prep. -following bowel purge will increase Amitiza to 24 mcg BID -she will call us in a couple of weeks with a condition update.    2. Hx of adenomatous colon polyps. Due for surveillance around Aug 2019.      HPI:    Chief Complaint: follow up constipation   Patient is a 62 yo female known to Dr. Henrene Pastor. I saw her last Feb for constipation with decreased urgency and difficulty expelling stool despite good hydration and fiber. She was started on Amitiza 8 mcg and is here for follow up. Amitiza 8 mcg bid did nothing for her constipation. Able to take castorl oil and have at least a small BM. Even Correctol not working as well as before.  No blood in stool  Rarely takes Ditropan or muscle relaxers. Not on narcotics. Drinks plenty of water. Eating a lot of veggies and fruits.   Review of systems:    No chest pain. No SOB. No urinary sx  Past Medical History:  Diagnosis Date  . Arthritis    back, neck  . Chronic back pain   . COPD (chronic obstructive pulmonary disease) ([place])   . Depression   . Fibromyalgia   . GERD (gastroesophageal reflux disease)   . Hematuria   . History of kidney stones   . History of seizure    x1 2007 post op (per pt neurologist work-up done ?mixture of anesthesia medication and prozac that pt had taken)  and has not any issues since   . History of squamous cell carcinoma excision   . Hyperlipidemia   . Hypertension   . IC (interstitial cystitis)   . Migraine   . Pulmonary nodules    multiple per ct -- monitored by pcp  . Right ureteral stone   . RLS (restless legs syndrome)     Patient's surgical history,  family medical history, social history, medications and allergies were all reviewed in Epic    Physical Exam:     BP 126/68   Pulse 67   Ht 5\' 1"  (1.549 m)   Wt 165 lb (74.8 kg)   BMI 31.18 kg/m   GENERAL:  Well developed white female in NAD PSYCH: :Pleasant, cooperative, normal affect EENT:  conjunctiva pink, mucous membranes moist, neck supple without masses CARDIAC:  RRR,  No murmur heard, no peripheral edema PULM: Normal respiratory effort, lungs CTA bilaterally, no wheezing ABDOMEN:  Nondistended, soft, nontender. No obvious masses, no hepatomegaly,  normal bowel sounds SKIN:  turgor, no lesions seen Musculoskeletal:  Normal muscle tone, normal strength NEURO: Alert and oriented x 3, no focal neurologic deficits   Tye Savoy , NP 01/04/2018, 10:17 AM

## 2018-01-04 NOTE — Patient Instructions (Addendum)
If you are age 64 or older, your body mass index should be between 23-30. Your Body mass index is 31.18 kg/m. If this is out of the aforementioned range listed, please consider follow up with your Primary Care Provider.  If you are age 32 or younger, your body mass index should be between 19-25. Your Body mass index is 31.18 kg/m. If this is out of the aformentioned range listed, please consider follow up with your Primary Care Provider.   We have sent the following medications to your pharmacy for you to pick up at your convenience: Amitiza 24 mcg  Do Bowel Purge: Moviprep.  Patient instructed on how to use Moviprep.  Thank you for choosing me and [facility].   Tye Savoy, NP

## 2018-01-06 ENCOUNTER — Encounter: Payer: Self-pay | Admitting: Nurse Practitioner

## 2018-01-06 ENCOUNTER — Other Ambulatory Visit: Payer: Self-pay | Admitting: Family Medicine

## 2018-01-06 NOTE — Progress Notes (Signed)
Assessment and plans reviewed  

## 2018-01-09 NOTE — Telephone Encounter (Signed)
Tried to call patient. No answer received

## 2018-01-11 NOTE — Telephone Encounter (Signed)
Left message to call office

## 2018-01-11 NOTE — Telephone Encounter (Signed)
Pt returning call and states it is ok to leave Detailed message. Home number [phone]. Please try home number first.

## 2018-01-11 NOTE — Telephone Encounter (Signed)
See note

## 2018-01-12 ENCOUNTER — Other Ambulatory Visit: Payer: Self-pay

## 2018-01-12 ENCOUNTER — Telehealth: Payer: Self-pay | Admitting: Family Medicine

## 2018-01-12 MED ORDER — ROSUVASTATIN CALCIUM 40 MG PO TABS: 40.0000 mg | ORAL_TABLET | Freq: Every day | ORAL | 3 refills | Status: DC

## 2018-01-12 NOTE — Telephone Encounter (Signed)
Called and spoke with patient. She is seeing Gastroenterology so she will talk to them about restarting the Pepcid. She denies needing any Baclofen at this time. She isn't sure why the pharmacy requested a refill on this. She did ask to switch her Crestor prescription to [facility]. I did send in that refill to the pharmacy

## 2018-01-12 NOTE — Telephone Encounter (Signed)
Pt declined AWV. °

## 2018-01-13 ENCOUNTER — Other Ambulatory Visit: Payer: Self-pay | Admitting: Family Medicine

## 2018-01-13 NOTE — Telephone Encounter (Signed)
Spoke with patient yesterday and she is going to follow up with Gastroenterology

## 2018-01-16 ENCOUNTER — Other Ambulatory Visit: Payer: Self-pay

## 2018-01-16 MED ORDER — BACLOFEN 10 MG PO TABS: ORAL_TABLET | ORAL | 1 refills | Status: DC

## 2018-01-24 ENCOUNTER — Ambulatory Visit: Payer: Medicare Other | Admitting: Family Medicine

## 2018-02-14 ENCOUNTER — Other Ambulatory Visit: Payer: Self-pay | Admitting: Family Medicine

## 2018-04-07 ENCOUNTER — Encounter: Payer: Self-pay | Admitting: Physician Assistant

## 2018-04-07 ENCOUNTER — Ambulatory Visit (INDEPENDENT_AMBULATORY_CARE_PROVIDER_SITE_OTHER): Payer: Medicare Other | Admitting: Physician Assistant

## 2018-04-07 ENCOUNTER — Ambulatory Visit: Payer: Medicare Other | Admitting: Family Medicine

## 2018-04-07 VITALS — BP 116/80 | HR 63 | Temp 98.2°F | Ht 61.0 in | Wt 165.8 lb

## 2018-04-07 DIAGNOSIS — E538 Deficiency of other specified B group vitamins: Secondary | ICD-10-CM

## 2018-04-07 DIAGNOSIS — M2550 Pain in unspecified joint: Secondary | ICD-10-CM

## 2018-04-07 LAB — COMPREHENSIVE METABOLIC PANEL
ALT: 16 U/L (ref 0–35)
AST: 15 U/L (ref 0–37)
Albumin: 4.3 g/dL (ref 3.5–5.2)
Alkaline Phosphatase: 84 U/L (ref 39–117)
BUN: 16 mg/dL (ref 6–23)
CALCIUM: 9.3 mg/dL (ref 8.4–10.5)
CO2: 29 mEq/L (ref 19–32)
Chloride: 105 mEq/L (ref 96–112)
Creatinine, Ser: 0.68 mg/dL (ref 0.40–1.20)
GFR: 92.5 mL/min (ref >60.00)
GLUCOSE: 95 mg/dL (ref 70–99)
POTASSIUM: 4.3 meq/L (ref 3.5–5.1)
Sodium: 140 mEq/L (ref 135–145)
TOTAL PROTEIN: 6.9 g/dL (ref 6.0–8.3)
Total Bilirubin: 0.4 mg/dL (ref 0.2–1.2)

## 2018-04-07 LAB — CBC
HEMATOCRIT: 42.3 % (ref 36.0–46.0)
HEMOGLOBIN: 14.2 g/dL (ref 12.0–15.0)
MCHC: 33.5 g/dL (ref 30.0–36.0)
MCV: 93.6 fl (ref 78.0–100.0)
PLATELETS: 257 10*3/uL (ref 150.0–400.0)
RBC: 4.52 Mil/uL (ref 3.87–5.11)
RDW: 14.6 % (ref 11.5–15.5)
WBC: 6.7 10*3/uL (ref 4.0–10.5)

## 2018-04-07 LAB — VITAMIN B12: Vitamin B-12: 228 pg/mL (ref 211–911)

## 2018-04-07 MED ORDER — METHYLPREDNISOLONE 4 MG PO TBPK: ORAL_TABLET | ORAL | 0 refills | Status: DC

## 2018-04-07 NOTE — Progress Notes (Signed)
Alison Brooks is a 62 y.o. female here for a new problem.  History of Present Illness:   Chief Complaint  Patient presents with  . Joint Pain    last couple of months pretty much all joints have started hurting more and more. I tmay ease up but then comes right back. its starting to wake her up in her sleep.    HPI   Patient endorses joint pain over the last couple of months, pain started out in wrist and fingers and also she has developed pain in her knees, ankles, toes.  Describes the pain as dull and aching, throbs at times.  Also sometimes as a burning sensation. Symptoms are getting worse with time. Oral voltaren 75 mg BID but not helpful, also causes GI distress.  Denies significant weight changes, night sweats, chest pain, shortness of breath, unusual rashes.  She states that she has had history of back pain, was on methadone for 10 to 15 years, has been off completely for the past 2 years.  Does also have a history of fibromyalgia.    Last year, saw a rheumatologist and was told she did not have any autoimmune disorders.    Currently going to [facility] for weight loss, goes every week to be weighed in.  Was at one point on some thyroid supplements through them, she is unable to tell me which ones they are.  She had an appointment with her PCP this afternoon to discuss this, however patient walked in the office today and wanted to be  seen sooner.  Additionally Crestor is noted on her medications, however she states she is not taking this.  Wt Readings from Last 5 Encounters:  04/07/18 165 lb 12.8 oz (75.2 kg)  01/04/18 165 lb (74.8 kg)  12/16/17 165 lb (74.8 kg)  11/14/17 164 lb 12.8 oz (74.8 kg)  10/11/17 168 lb 9.6 oz (76.5 kg)    Past Medical History:  Diagnosis Date  . Arthritis    back, neck  . Chronic back pain   . COPD (chronic obstructive pulmonary disease) ([place])   . Depression   . Fibromyalgia   . GERD (gastroesophageal reflux disease)   . Hematuria    . History of kidney stones   . History of seizure    x1 2007 post op (per pt neurologist work-up done ?mixture of anesthesia medication and prozac that pt had taken)  and has not any issues since   . History of squamous cell carcinoma excision   . Hyperlipidemia   . Hypertension   . IC (interstitial cystitis)   . Migraine   . Pulmonary nodules    multiple per ct -- monitored by pcp  . Right ureteral stone   . RLS (restless legs syndrome)      Social History   Socioeconomic History  . Marital status: Married    Spouse name: Antony Haste  . Number of children: 1  . Years of education: College  . Highest education level: Not on file  Occupational History  . Occupation: Retired Ship broker  . Financial resource strain: Not on file  . Food insecurity:    Worry: Not on file    Inability: Not on file  . Transportation needs:    Medical: Not on file    Non-medical: Not on file  Tobacco Use  . Smoking status: Former Smoker    Packs/day: 1.25    Years: 40.00    Pack years: 50.00  Types: Cigarettes    Last attempt to quit: 06/07/2008    Years since quitting: 9.8  . Smokeless tobacco: Never Used  Substance and Sexual Activity  . Alcohol use: Yes    Alcohol/week: 0.0 oz    Comment: VERY RARE  . Drug use: No  . Sexual activity: Not on file  Lifestyle  . Physical activity:    Days per week: Not on file    Minutes per session: Not on file  . Stress: Not on file  Relationships  . Social connections:    Talks on phone: Not on file    Gets together: Not on file    Attends religious service: Not on file    Active member of club or organization: Not on file    Attends meetings of clubs or organizations: Not on file    Relationship status: Not on file  . Intimate partner violence:    Fear of current or ex partner: Not on file    Emotionally abused: Not on file    Physically abused: Not on file    Forced sexual activity: Not on file  Other Topics Concern  . Not on file   Social History Narrative   Lives with spouse, Antony Haste.    Caffeine use: 1-2 cups coffee per day   Married 43 years in December. 1 child. 2 grandkids (boy and girl). Lives 10 miles away.       Disabled.       Hobbies: previously enjoyed dancing, old car shoes (57 chevy), reading    Past Surgical History:  Procedure Laterality Date  . CARDIOVASCULAR STRESS TEST  09-02-2004   normal perfusion study/  normal LV function and wall motion, ef 64%  . CARPAL TUNNEL RELEASE Right   . CERVICAL FUSION  1992  . CHOLECYSTECTOMY  1995  . CYSTO/  BOTOX INJECTION  12-27-2008  &  04-01-2006  . CYSTO/  HYDRODISTENTION/  INSTILLATION THERAPY  03-04-2006  . CYSTOSCOPY W/ URETERAL STENT PLACEMENT Right 04/02/2016   Procedure: CYSTOSCOPY WITH RIGHT RETROGRADE, URETEROSCOPY PYELOGRAM LASER LITHOTRIPSY,/URETERAL STENT PLACEMENT;  Surgeon: Kathie Rhodes, MD;  Location: [place] [facility];  Service: Urology;  Laterality: Right;  . HEMORRHOID SURGERY  01-03-2004  . HOLMIUM LASER APPLICATION Right 11/26/252   Procedure: HOLMIUM LASER APPLICATION;  Surgeon: Kathie Rhodes, MD;  Location: [facility];  Service: Urology;  Laterality: Right;  . LUMBAR FUSION  1996   L4 --S1  . ROTATOR CUFF REPAIR Left 2004  . SHOULDER ARTHROSCOPY WITH BICEPS TENDON REPAIR Left 03/ 2017  . SPINAL CORD STIMULATOR IMPLANT  x4  last one 2013  . STONE EXTRACTION WITH BASKET Right 04/02/2016   Procedure: STONE EXTRACTION WITH BASKET;  Surgeon: Kathie Rhodes, MD;  Location: [facility];  Service: Urology;  Laterality: Right;  . TONSILLECTOMY  1964  . TRANSTHORACIC ECHOCARDIOGRAM  04-17-2014   EF 60-65%/  trivial AR, MR, and TR  . VAGINAL HYSTERECTOMY  1984  . WRIST GANGLION EXCISION Right 02-05-2004    Family History  Problem Relation Age of Onset  . Heart attack Mother        Mom 71, Dad 6  . Hypertension Mother   . Heart disease Mother   . Heart disease Father   . Diabetes Father   . Colon cancer  Maternal Grandmother        not sure age of onset    Allergies  Allergen Reactions  . Latex Other (See Comments)  blisters  . Oxycodone Nausea And Vomiting    And sweating  . Zofran [Ondansetron Hcl] Other (See Comments)    headache    Current Medications:   Current Outpatient Medications:  .  allopurinol (ZYLOPRIM) 100 MG tablet, TAKE 1 TABLET BY MOUTH  DAILY, Disp: 90 tablet, Rfl: 1 .  amLODipine (NORVASC) 2.5 MG tablet, Take 1 tablet (2.5 mg total) by mouth daily., Disp: 90 tablet, Rfl: 3 .  atorvastatin (LIPITOR) 40 MG tablet, , Disp: , Rfl:  .  baclofen (LIORESAL) 10 MG tablet, TAKE 1/2 TO 1 TABLET BY  MOUTH 3 TIMES DAILY, Disp: 180 tablet, Rfl: 1 .  cyanocobalamin (,VITAMIN B-12,) 1000 MCG/ML injection, 1000 mcg (1 mg) injection once per per month., Disp: 30 mL, Rfl: 0 .  diclofenac (VOLTAREN) 75 MG EC tablet, Take 1 tablet (75 mg total) by mouth 2 (two) times daily., Disp: 30 tablet, Rfl: 1 .  escitalopram (LEXAPRO) 20 MG tablet, Take 1 tablet (20 mg total) by mouth daily., Disp: 90 tablet, Rfl: 3 .  lubiprostone (AMITIZA) 24 MCG capsule, Take 1 capsule (24 mcg total) by mouth 2 (two) times daily with a meal., Disp: 60 capsule, Rfl: 3 .  oxybutynin (DITROPAN) 5 MG tablet, TAKE 1 TABLET BY MOUTH 2  TIMES DAILY AS NEEDED FOR  BLADDER SPASMS., Disp: 180 tablet, Rfl: 3 .  pantoprazole (PROTONIX) 40 MG tablet, TAKE 1 TABLET BY MOUTH  DAILY. TAKE 30-60 MIN  BEFORE FIRST MEAL OF THE  DAY, Disp: 90 tablet, Rfl: 3 .  topiramate (TOPAMAX) 100 MG tablet, Take 1 tablet (100 mg total) by mouth daily., Disp: 90 tablet, Rfl: 3 .  methylPREDNISolone (MEDROL DOSEPAK) 4 MG TBPK tablet, 6-5-4-3-2-1 off, Disp: 21 tablet, Rfl: 0 .  rosuvastatin (CRESTOR) 40 MG tablet, Take 1 tablet (40 mg total) by mouth daily. (Patient not taking: Reported on 04/07/2018), Disp: 90 tablet, Rfl: 3   Review of Systems:   ROS  Negative unless otherwise specified per HPI.  Vitals:   Vitals:   04/07/18 1047   BP: 116/80  Pulse: 63  Temp: 98.2 F (36.8 C)  TempSrc: Oral  SpO2: 98%  Weight: 165 lb 12.8 oz (75.2 kg)  Height: 5\' 1"  (1.549 m)     Body mass index is 31.33 kg/m.  Physical Exam:   Physical Exam  Constitutional: She appears well-developed. She is cooperative.  Non-toxic appearance. She does not have a sickly appearance. She does not appear ill. No distress.  Cardiovascular: Normal rate, regular rhythm, S1 normal, S2 normal, normal heart sounds and normal pulses.  No LE edema  Pulmonary/Chest: Effort normal and breath sounds normal.  Musculoskeletal:  Normal range of motion in bilateral hands and feet.  No joint swelling appreciated on exam today.  No erythema or ecchymosis.  No crepitus or laxity when moving hands and feet.  Neurological: She is alert. GCS eye subscore is 4. GCS verbal subscore is 5. GCS motor subscore is 6.  Grip strength 5 out of 5 bilaterally.  Normal patellar reflexes.  Skin: Skin is warm, dry and intact.  Psychiatric: She has a normal mood and affect. Her speech is normal and behavior is normal.  Nursing note and vitals reviewed.     Assessment and Plan:    Masako was seen today for joint pain.  Diagnoses and all orders for this visit:  Arthralgia, unspecified joint Unclear etiology.  Patient says that she has had negative work-up of rheumatoid arthritis recently --I do not have these  labs or office visit notes to review.  No red flags on my exam today.  Briefly discussed with patient's PCP, Dr. Yong Channel.  Discussed options with patient, will try a Medrol Dosepak at this time.  She states she has tolerated steroids in the past.  We also contemplated trying gabapentin, will defer to her PCP when she follows up for this.  I recommended that she follow-up with him in 1 week.  We will recheck a CBC and CMP today.  Patient was agreeable to plan. -     CBC -     Comprehensive metabolic panel -     Vitamin B12  Vitamin B12 deficiency She does have a  history of B12 deficiency, required injections.  She states that she still does monthly B12 injections.  We will recheck level today. -     Vitamin B12  Other orders -     methylPREDNISolone (MEDROL DOSEPAK) 4 MG TBPK tablet; 6-5-4-3-2-1 off    . Reviewed expectations re: course of current medical issues. . Discussed self-management of symptoms. . Outlined signs and symptoms indicating need for more acute intervention. . Patient verbalized understanding and all questions were answered. . See orders for this visit as documented in the electronic medical record. . Patient received an After-Visit Summary.  Inda Coke, PA-C

## 2018-04-07 NOTE — Patient Instructions (Signed)
It was great to see you.  Lets start with a medrol dose pack (this is a round of steroids), follow package directions.  Lets get you to see Dr. Yong Channel next week, he can check further labs if needed and discuss next steps.

## 2018-04-20 ENCOUNTER — Ambulatory Visit: Payer: Medicare Other | Admitting: Family Medicine

## 2018-04-20 ENCOUNTER — Encounter: Payer: Self-pay | Admitting: Family Medicine

## 2018-04-20 ENCOUNTER — Ambulatory Visit (INDEPENDENT_AMBULATORY_CARE_PROVIDER_SITE_OTHER): Payer: Medicare Other | Admitting: Family Medicine

## 2018-04-20 VITALS — BP 120/78 | HR 62 | Temp 98.5°F | Ht 61.0 in | Wt 162.0 lb

## 2018-04-20 DIAGNOSIS — Z23 Encounter for immunization: Secondary | ICD-10-CM

## 2018-04-20 DIAGNOSIS — M2550 Pain in unspecified joint: Secondary | ICD-10-CM

## 2018-04-20 DIAGNOSIS — E538 Deficiency of other specified B group vitamins: Secondary | ICD-10-CM | POA: Diagnosis not present

## 2018-04-20 MED ORDER — CYANOCOBALAMIN 1000 MCG/ML IJ SOLN: INTRAMUSCULAR | 0 refills | Status: DC

## 2018-04-20 MED ORDER — DICLOFENAC SODIUM 1 % TD GEL: 4.0000 g | Freq: Four times a day (QID) | TRANSDERMAL | 1 refills | Status: AC

## 2018-04-20 MED ORDER — PREDNISONE 50 MG PO TABS: ORAL_TABLET | ORAL | 0 refills | Status: DC

## 2018-04-20 NOTE — Progress Notes (Signed)
   Subjective:  Alison Brooks is a 62 y.o. female who presents today with a chief complaint of polyarthralgia.   HPI:  Polyarthralgia, chronic problem, worsening Symptoms have been present for the past several months.  She was seen in clinic about a week ago and given a Medrol Dosepak.  Patient notes that her symptoms improved significantly for the first 2 days, however had worsening symptoms as she started tapering down the dose.  Currently has most of her pain in her bilateral hands, bilateral knees, and bilateral feet.  Also has a small amount of neck pain, shoulder pain, and low back pain.  No reported weakness or numbness.  Pain is described as a "achy sensation".  Occasionally has pins and needles type sensations.  She had an episode last week where her right eye was very sensitive to touch.  Patient reports she has seen rheumatology about a year ago and was told that she did not have any sort of  autoimmune disorders.  B12 Deficiency, chronic problem, stable Has been on B12 injections in the past.  She performs these herself.  Would like refill on this today.  ROS: Per HPI  PMH: She reports that she quit smoking about 9 years ago. Her smoking use included cigarettes. She has a 50.00 pack-year smoking history. She has never used smokeless tobacco. She reports that she drinks alcohol. She reports that she does not use drugs.  Objective:  Physical Exam: BP 120/78 (BP Location: Left Arm, Patient Position: Sitting, Cuff Size: Normal)   Pulse 62   Temp 98.5 F (36.9 C) (Oral)   Ht 5\' 1"  (1.549 m)   Wt 162 lb (73.5 kg)   SpO2 98%   BMI 30.61 kg/m   Gen: NAD, resting comfortably CV: RRR with no murmurs appreciated Pulm: NWOB, CTAB with no crackles, wheezes, or rhonchi GI: Normal bowel sounds present. Soft, Nontender, Nondistended. MSK: -Neck: No deformities.  Limited rightward and leftward rotation.  Strength 5 out of 5 in all directions.  Spurling maneuver negative  bilaterally. -Upper extremities: No deformities.  Neurovascularly intact distally.  Very slight ulnar deviation of bilateral digits. -Back: No deformities.  Nontender to palpation. -Lower extremities: No deformities.  Strength 5 out of 5 throughout.  Neurovascularly intact distally.  Assessment/Plan:  Polyarthralgia Unclear underlying etiology.  Likely multifactorial.  She had a neck CT scan last year which revealed significant degenerative disc disease with foraminal stenosis and nerve root involvement-this could likely be contributing.  She also has a history of fibromyalgia which could be contributing.  It is also very likely she has underlying osteoarthritis which is contributing as well.  Discussed treatment options with patient today.  We will give 1 week burst of prednisone.  We will also start Voltaren gel for her hands.  Discussed possibly starting gabapentin versus Lyrica and obtaining plain films today, however patient deferred.  She will follow-up with her PCP in a few weeks if symptoms persist.  Vitamin B12 deficiency B12 level low-normal at 228 when checked 2 weeks ago.  We will restart her B12 injections.  Prescription was sent into her pharmacy.  She will perform injections at home-she has done this before and she is comfortable with this.  Preventative Healthcare Tdap given today.  Algis Greenhouse. Jerline Pain, MD 04/20/2018 11:31 AM

## 2018-04-20 NOTE — Patient Instructions (Addendum)
Health Maintenance Due  Topic Date Due  . TETANUS/TDAP -  10/25/2016   It was very nice to see you today!  I think your pain could be coming from several different sources.  You do have some arthritis in your neck with nerve root irritation which could be contributing.  You also may have some fibromyalgia.  It is also very possible that she could have arthritis in your hands, knees, and feet.  We will start prednisone today.  We will also start diclofenac gel for use on your hands and feet as needed.  Please let me know or let Dr. Yong Channel know if your symptoms worsen or do not improve over the next 1 to 2 weeks.  At some point, it may be a good idea to get x-rays done.  You may also benefit from starting either gabapentin or Lyrica.  I will send in your refill for your B12 today.  Take care, Dr Jerline Pain

## 2018-04-21 ENCOUNTER — Telehealth: Payer: Self-pay

## 2018-04-21 NOTE — Telephone Encounter (Signed)
PA for diclofenac gel is in progress through Cover My Meds.  Waiting for insurance decision.

## 2018-04-21 NOTE — Telephone Encounter (Signed)
PA approved through 10/24/2018.  Patient's pharmacy notified.

## 2018-05-22 DIAGNOSIS — E559 Vitamin D deficiency, unspecified: Secondary | ICD-10-CM | POA: Diagnosis not present

## 2018-05-22 DIAGNOSIS — E039 Hypothyroidism, unspecified: Secondary | ICD-10-CM | POA: Diagnosis not present

## 2018-06-17 ENCOUNTER — Other Ambulatory Visit: Payer: Self-pay | Admitting: Family Medicine

## 2018-07-13 ENCOUNTER — Other Ambulatory Visit: Payer: Self-pay | Admitting: Family Medicine

## 2018-07-25 DIAGNOSIS — M5442 Lumbago with sciatica, left side: Secondary | ICD-10-CM | POA: Diagnosis not present

## 2018-07-25 DIAGNOSIS — G894 Chronic pain syndrome: Secondary | ICD-10-CM | POA: Diagnosis not present

## 2018-07-25 DIAGNOSIS — Z9689 Presence of other specified functional implants: Secondary | ICD-10-CM | POA: Diagnosis not present

## 2018-07-25 DIAGNOSIS — M961 Postlaminectomy syndrome, not elsewhere classified: Secondary | ICD-10-CM | POA: Diagnosis not present

## 2018-08-03 ENCOUNTER — Ambulatory Visit: Payer: Medicare Other | Admitting: Internal Medicine

## 2018-08-03 ENCOUNTER — Encounter: Payer: Self-pay | Admitting: *Deleted

## 2018-08-03 VITALS — BP 110/70 | HR 67 | Ht 61.0 in | Wt 160.0 lb

## 2018-08-03 DIAGNOSIS — R490 Dysphonia: Secondary | ICD-10-CM | POA: Diagnosis not present

## 2018-08-03 DIAGNOSIS — K222 Esophageal obstruction: Secondary | ICD-10-CM

## 2018-08-03 DIAGNOSIS — Z86010 Personal history of colonic polyps: Secondary | ICD-10-CM | POA: Diagnosis not present

## 2018-08-03 DIAGNOSIS — K219 Gastro-esophageal reflux disease without esophagitis: Secondary | ICD-10-CM

## 2018-08-03 DIAGNOSIS — R4702 Dysphasia: Secondary | ICD-10-CM

## 2018-08-03 MED ORDER — NA SULFATE-K SULFATE-MG SULF 17.5-3.13-1.6 GM/177ML PO SOLN: 1.0000 | Freq: Once | ORAL | 0 refills | Status: AC

## 2018-08-03 MED ORDER — FAMOTIDINE 20 MG PO TABS: 20.0000 mg | ORAL_TABLET | Freq: Every day | ORAL | 6 refills | Status: DC

## 2018-08-03 NOTE — Patient Instructions (Signed)
We have sent the following medications to your pharmacy for you to pick up at your convenience: Pepcid  You have been scheduled for an endoscopy and colonoscopy. Please follow the written instructions given to you at your visit today. Please pick up your prep supplies at the pharmacy within the next 1-3 days. If you use inhalers (even only as needed), please bring them with you on the day of your procedure. Your physician has requested that you go to www.startemmi.com and enter the access code given to you at your visit today. This web site gives a general overview about your procedure. However, you should still follow specific instructions given to you by our office regarding your preparation for the procedure.

## 2018-08-03 NOTE — Progress Notes (Signed)
HISTORY OF PRESENT ILLNESS:  Alison Brooks is a 62 y.o. female with chronic back pain, chronic migraines, multiple other medical problems as listed below, GERD complicated by peptic stricture, and adenomatous colon polyps.  The patient presents today with a 6-week history of raspy voice, sensation of constant substernal burning, globus sensation, recurrent intermittent solid food dysphasia, and an isolated episode of what sounds like laryngospasm.  She tells me that she has continued on pantoprazole 40 mg twice daily since her upper endoscopy in 2017.  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  The stricture was dilated to 47 [place].  Her dysphasia resolved.  At one point she was placed on Pepcid 20 mg at night in addition.  This was stopped by her PCP about 8 or 9 months ago.  Patient denies any recent steroids or antibiotics.  No weight loss.  She has not smoked in over 10 years.  Her last colonoscopy was August 2014.  She does have a history of advanced adenoma in 2007.  Last examination with tubular adenoma.  She is due for follow-up.  She did receive a recall letter.  Review of abdominal x-rays from January 2019 revealed changes consistent with constipation.  Review of outside blood work from June 2019 finds normal conference of metabolic panel and CBC.  REVIEW OF SYSTEMS:  All non-GI ROS negative less otherwise stated in the HPI except for arthritis, back pain, cough, muscle cramps, fatigue, voice change, sore throat  Past Medical History:  Diagnosis Date  . Arthritis    back, neck  . Chronic back pain   . COPD (chronic obstructive pulmonary disease) ([place])   . Depression   . Fibromyalgia   . GERD (gastroesophageal reflux disease)   . Hematuria   . History of adenomatous polyp of colon   . History of kidney stones   . History of seizure    x1 2007 post op (per pt neurologist work-up done ?mixture of anesthesia medication and prozac that  pt had taken)  and has not any issues since   . History of squamous cell carcinoma excision   . Hyperlipidemia   . Hypertension   . IC (interstitial cystitis)   . Migraine   . Pulmonary nodules    multiple per ct -- monitored by pcp  . Right ureteral stone   . RLS (restless legs syndrome)     Past Surgical History:  Procedure Laterality Date  . CARDIOVASCULAR STRESS TEST  09-02-2004   normal perfusion study/  normal LV function and wall motion, ef 64%  . CARPAL TUNNEL RELEASE Right   . CERVICAL FUSION  1992  . CHOLECYSTECTOMY  1995  . CYSTO/  BOTOX INJECTION  12-27-2008  &  04-01-2006  . CYSTO/  HYDRODISTENTION/  INSTILLATION THERAPY  03-04-2006  . CYSTOSCOPY W/ URETERAL STENT PLACEMENT Right 04/02/2016   Procedure: CYSTOSCOPY WITH RIGHT RETROGRADE, URETEROSCOPY PYELOGRAM LASER LITHOTRIPSY,/URETERAL STENT PLACEMENT;  Surgeon: Kathie Rhodes, MD;  Location: [place];  Service: Urology;  Laterality: Right;  . HEMORRHOID SURGERY  01-03-2004  . HOLMIUM LASER APPLICATION Right 03/27/7857   Procedure: HOLMIUM LASER APPLICATION;  Surgeon: Kathie Rhodes, MD;  Location: [facility];  Service: Urology;  Laterality: Right;  . LUMBAR FUSION  1996   L4 --S1  . ROTATOR CUFF REPAIR Left 2004  . SHOULDER ARTHROSCOPY WITH BICEPS TENDON REPAIR Left 03/ 2017  . SPINAL CORD STIMULATOR IMPLANT  x4  last  one 2013  . STONE EXTRACTION WITH BASKET Right 04/02/2016   Procedure: STONE EXTRACTION WITH BASKET;  Surgeon: Kathie Rhodes, MD;  Location: [facility];  Service: Urology;  Laterality: Right;  . TONSILLECTOMY  1964  . TRANSTHORACIC ECHOCARDIOGRAM  04-17-2014   EF 60-65%/  trivial AR, MR, and TR  . VAGINAL HYSTERECTOMY  1984  . WRIST GANGLION EXCISION Right 02-05-2004    Social History DEKLYN TRACHTENBERG  reports that she quit smoking about 10 years ago. Her smoking use included cigarettes. She has a 50.00 pack-year smoking history. She has never used smokeless  tobacco. She reports that she drinks alcohol. She reports that she does not use drugs.  family history includes Colon cancer in her maternal grandmother; Diabetes in her father; Heart attack in her mother; Heart disease in her father and mother; Hypertension in her mother.  Allergies  Allergen Reactions  . Latex Other (See Comments)    blisters  . Oxycodone Nausea And Vomiting    And sweating  . Zofran [Ondansetron Hcl] Other (See Comments)    headache       PHYSICAL EXAMINATION: Vital signs: BP 110/70   Pulse 67   Ht 5\' 1"  (1.549 m)   Wt 160 lb (72.6 kg)   BMI 30.23 kg/m   Constitutional: Pleasant, generally well-appearing, no acute distress Psychiatric: alert and oriented x3, cooperative Eyes: extraocular movements intact, anicteric, conjunctiva pink Mouth: oral pharynx moist, no lesions.  No thrush Neck: supple no lymphadenopathy Cardiovascular: heart regular rate and rhythm, no murmur Lungs: clear to auscultation bilaterally Abdomen: soft, obese, nontender, nondistended, no obvious ascites, no peritoneal signs, normal bowel sounds, no organomegaly.  Scar from previous stimulator placement in the right mid abdominal wall Rectal: Deferred until colonoscopy Extremities: no clubbing, cyanosis, or lower extremity edema bilaterally Skin: no lesions on visible extremities Neuro: No focal deficits.  Cranial nerves intact.  ASSESSMENT:  1.  Problems with hoarseness and substernal burning and intermittent cough over the course of 6 weeks.  Question GERD.  Question other 2.  Recurrent dysphasia 3.  Adenomatous colon polyps.  Due for surveillance 4.  Multiple medical problems   PLAN:  1.  Reflux precautions 2.  Continue pantoprazole 40 mg twice daily 3.  Prescribe Pepcid 20 mg at night 4.  Schedule upper endoscopy with possible esophageal dilation.The nature of the procedure, as well as the risks, benefits, and alternatives were carefully and thoroughly reviewed with the  patient. Ample time for discussion and questions allowed. The patient understood, was satisfied, and agreed to proceed. 5.  Schedule surveillance colonoscopy.The nature of the procedure, as well as the risks, benefits, and alternatives were carefully and thoroughly reviewed with the patient. Ample time for discussion and questions allowed. The patient understood, was satisfied, and agreed to proceed. 6.  May need ENT evaluation 7.  Ongoing general medical care with PCP  40 minutes spent face-to-face with the patient.  Greater than 50% of the time used for counseling regarding her GERD, laryngeal complaints, dysphagia, colorectal neoplasia surveillance

## 2018-08-17 ENCOUNTER — Encounter: Payer: Self-pay | Admitting: Internal Medicine

## 2018-08-21 DIAGNOSIS — E039 Hypothyroidism, unspecified: Secondary | ICD-10-CM | POA: Diagnosis not present

## 2018-08-29 ENCOUNTER — Ambulatory Visit (AMBULATORY_SURGERY_CENTER): Payer: Medicare Other | Admitting: Internal Medicine

## 2018-08-29 ENCOUNTER — Encounter: Payer: Self-pay | Admitting: Internal Medicine

## 2018-08-29 VITALS — BP 142/61 | HR 63 | Temp 97.3°F | Resp 16 | Ht 61.0 in | Wt 160.0 lb

## 2018-08-29 DIAGNOSIS — K219 Gastro-esophageal reflux disease without esophagitis: Secondary | ICD-10-CM

## 2018-08-29 DIAGNOSIS — Z85038 Personal history of other malignant neoplasm of large intestine: Secondary | ICD-10-CM

## 2018-08-29 DIAGNOSIS — D123 Benign neoplasm of transverse colon: Secondary | ICD-10-CM

## 2018-08-29 DIAGNOSIS — Z86010 Personal history of colonic polyps: Secondary | ICD-10-CM | POA: Diagnosis not present

## 2018-08-29 DIAGNOSIS — R1310 Dysphagia, unspecified: Secondary | ICD-10-CM | POA: Diagnosis not present

## 2018-08-29 DIAGNOSIS — D122 Benign neoplasm of ascending colon: Secondary | ICD-10-CM

## 2018-08-29 DIAGNOSIS — K222 Esophageal obstruction: Secondary | ICD-10-CM

## 2018-08-29 DIAGNOSIS — D124 Benign neoplasm of descending colon: Secondary | ICD-10-CM

## 2018-08-29 DIAGNOSIS — R4702 Dysphasia: Secondary | ICD-10-CM

## 2018-08-29 MED ORDER — SODIUM CHLORIDE 0.9 % IV SOLN: 500.0000 mL | Freq: Once | INTRAVENOUS | Status: DC

## 2018-08-29 NOTE — Op Note (Signed)
[facility] Patient Name: Alison Brooks Procedure Date: 08/29/2018 2:34 PM MRN: [id number] Endoscopist: Docia Chuck. Henrene Pastor , MD Age: 62 Referring MD:  Date of Birth: [birth year] Gender: Female Account #: [id number] Procedure:                Colonoscopy with EMR and submucosal injection and                            cold snare polypectomy Indications:              High risk colon cancer surveillance: Personal                            history of adenoma (10 mm or greater in size).                            Advanced adenoma 2007. Follow-up 2008, 2014. Parent                            with colon cancer in 11s Medicines:                Monitored Anesthesia Care Procedure:                Pre-Anesthesia Assessment:                           - Prior to the procedure, a History and Physical                            was performed, and patient medications and                            allergies were reviewed. The patient's tolerance of                            previous anesthesia was also reviewed. The risks                            and benefits of the procedure and the sedation                            options and risks were discussed with the patient.                            All questions were answered, and informed consent                            was obtained. Prior Anticoagulants: The patient has                            taken no previous anticoagulant or antiplatelet                            agents. ASA Grade Assessment: II - A patient with  mild systemic disease. After reviewing the risks                            and benefits, the patient was deemed in                            satisfactory condition to undergo the procedure.                           After obtaining informed consent, the colonoscope                            was passed under direct vision. Throughout the                            procedure, the patient's blood  pressure, pulse, and                            oxygen saturations were monitored continuously. The                            Colonoscope was introduced through the anus and                            advanced to the the cecum, identified by                            appendiceal orifice and ileocecal valve. The                            ileocecal valve, appendiceal orifice, and rectum                            were photographed. The quality of the bowel                            preparation was excellent. The colonoscopy was                            performed without difficulty. The patient tolerated                            the procedure well. The bowel preparation used was                            SUPREP. Scope In: 2:50:40 PM Scope Out: 4:18:37 PM Scope Withdrawal Time: 1 hour 20 minutes 27 seconds  Total Procedure Duration: 1 hour 27 minutes 57 seconds  Findings:                 A greater than 50 mm polyp was found in the                            proximal ascending colon. The polyp was lateral  spreading. The polyp was removed with a saline                            injection-lift technique using a hot snare.                            Resection and retrieval were complete. Area was                            tattooed with an injection of Spot (carbon black).                            Large volume of polypoid tissue was removed with                            Jabier Mutton net the polypectomy took approximately 1 hour.                            See images. Small descending colon polyp was                            removed with cold snare polypectomy and submitted                            for pathologic analysis.                           The exam was otherwise without abnormality on                            direct and retroflexion views. Complications:            No immediate complications. Estimated blood loss:                             None. Estimated Blood Loss:     Estimated blood loss: none. Impression:               - One greater than 50 mm polyp in the proximal                            ascending colon, removed using injection-lift and a                            hot snare. Resected and retrieved. Tattooed.                           - The examination was otherwise normal on direct                            and retroflexion views. Recommendation:           - Repeat colonoscopy in 3 months for surveillance.                           -  Patient has a contact number available for                            emergencies. The signs and symptoms of potential                            delayed complications were discussed with the                            patient. Return to normal activities tomorrow.                            Written discharge instructions were provided to the                            patient.                           - Resume previous diet.                           - Continue present medications.                           - Await pathology results. Docia Chuck. Henrene Pastor, MD 08/29/2018 4:36:50 PM This report has been signed electronically.

## 2018-08-29 NOTE — Progress Notes (Signed)
Called to room to assist during endoscopic procedure.  Patient ID and intended procedure confirmed with present staff. Received instructions for my participation in the procedure from the performing physician.  

## 2018-08-29 NOTE — Patient Instructions (Signed)
Discharge instructions given. Handouts on polyps and a dilatation diet. Resume previous medications. YOU HAD AN ENDOSCOPIC PROCEDURE TODAY AT [facility]:   Refer to the procedure report that was given to you for any specific questions about what was found during the examination.  If the procedure report does not answer your questions, please call your gastroenterologist to clarify.  If you requested that your care partner not be given the details of your procedure findings, then the procedure report has been included in a sealed envelope for you to review at your convenience later.  YOU SHOULD EXPECT: Some feelings of bloating in the abdomen. Passage of more gas than usual.  Walking can help get rid of the air that was put into your GI tract during the procedure and reduce the bloating. If you had a lower endoscopy (such as a colonoscopy or flexible sigmoidoscopy) you may notice spotting of blood in your stool or on the toilet paper. If you underwent a bowel prep for your procedure, you may not have a normal bowel movement for a few days.  Please Note:  You might notice some irritation and congestion in your nose or some drainage.  This is from the oxygen used during your procedure.  There is no need for concern and it should clear up in a day or so.  SYMPTOMS TO REPORT IMMEDIATELY:   Following lower endoscopy (colonoscopy or flexible sigmoidoscopy):  Excessive amounts of blood in the stool  Significant tenderness or worsening of abdominal pains  Swelling of the abdomen that is new, acute  Fever of 100F or higher   Following upper endoscopy (EGD)  Vomiting of blood or coffee ground material  New chest pain or pain under the shoulder blades  Painful or persistently difficult swallowing  New shortness of breath  Fever of 100F or higher  Black, tarry-looking stools  For urgent or emergent issues, a gastroenterologist can be reached at any hour by calling [phone]  [phone].   DIET:  We do recommend a small meal at first, but then you may proceed to your regular diet.  Drink plenty of fluids but you should avoid alcoholic beverages for 24 hours.  ACTIVITY:  You should plan to take it easy for the rest of today and you should NOT DRIVE or use heavy machinery until tomorrow (because of the sedation medicines used during the test).    FOLLOW UP: Our staff will call the number listed on your records the next business day following your procedure to check on you and address any questions or concerns that you may have regarding the information given to you following your procedure. If we do not reach you, we will leave a message.  However, if you are feeling well and you are not experiencing any problems, there is no need to return our call.  We will assume that you have returned to your regular daily activities without incident.  If any biopsies were taken you will be contacted by phone or by letter within the next 1-3 weeks.  Please call us at [phone] if you have not heard about the biopsies in 3 weeks.    SIGNATURES/CONFIDENTIALITY: You and/or your care partner have signed paperwork which will be entered into your electronic medical record.  These signatures attest to the fact that that the information above on your After Visit Summary has been reviewed and is understood.  Full responsibility of the confidentiality of this discharge information lies with you and/or your  care-partner.

## 2018-08-29 NOTE — Op Note (Signed)
[facility] Patient Name: Alison Brooks Procedure Date: 08/29/2018 2:34 PM MRN: [id number] Endoscopist: Docia Chuck. Henrene Pastor , MD Age: 62 Referring MD:  Date of Birth: 1956-02-04 Gender: Female Account #: [id number] Procedure:                Upper GI endoscopy with [facility] dilation of the                            esophagus. 11 French Indications:              Dysphagia, Esophageal reflux Medicines:                Monitored Anesthesia Care Procedure:                Pre-Anesthesia Assessment:                           - Prior to the procedure, a History and Physical                            was performed, and patient medications and                            allergies were reviewed. The patient's tolerance of                            previous anesthesia was also reviewed. The risks                            and benefits of the procedure and the sedation                            options and risks were discussed with the patient.                            All questions were answered, and informed consent                            was obtained. Prior Anticoagulants: The patient has                            taken no previous anticoagulant or antiplatelet                            agents. ASA Grade Assessment: II - A patient with                            mild systemic disease. After reviewing the risks                            and benefits, the patient was deemed in                            satisfactory condition to undergo the procedure.  After obtaining informed consent, the endoscope was                            passed under direct vision. Throughout the                            procedure, the patient's blood pressure, pulse, and                            oxygen saturations were monitored continuously. The                            Endoscope was introduced through the mouth, and                            advanced to the second part  of duodenum. The upper                            GI endoscopy was accomplished without difficulty.                            The patient tolerated the procedure well. Scope In: Scope Out: Findings:                 One benign-appearing, intrinsic moderate stenosis                            was found 36 cm from the incisors. This stenosis                            measured 1.5 cm (inner diameter). The scope was                            withdrawn. Dilation was performed with a Maloney                            dilator with no resistance at 99 Fr.                           The exam of the esophagus was otherwise normal.                           The stomach was normal.                           The examined duodenum was normal.                           The cardia and gastric fundus were normal on                            retroflexion. Complications:            No immediate complications. Estimated Blood Loss:     Estimated blood loss: none. Impression:               -  Benign-appearing esophageal stenosis. Dilated.                           - Normal stomach.                           - Normal examined duodenum.                           - No specimens collected. Recommendation:           - Patient has a contact number available for                            emergencies. The signs and symptoms of potential                            delayed complications were discussed with the                            patient. Return to normal activities tomorrow.                            Written discharge instructions were provided to the                            patient.                           - Post dilation diet.                           - Continue present medications. Docia Chuck. Henrene Pastor, MD 08/29/2018 4:40:14 PM This report has been signed electronically.

## 2018-08-29 NOTE — Progress Notes (Signed)
After pt getting up to get dressed, pt became dizzy and lightheaded.  Placed back into bed and hooked back up to monitor.  BP 142/61, P-55, sats 100%, sinus brady.  No c/o pain or discomfort.  HOB up.    1715- BP 133/59-52-100%

## 2018-08-29 NOTE — Progress Notes (Signed)
Spontaneous respirations throughout. VSS. Resting comfortably. To PACU on room air. Report to  RN. 

## 2018-08-30 ENCOUNTER — Telehealth: Payer: Self-pay | Admitting: *Deleted

## 2018-08-30 NOTE — Telephone Encounter (Signed)
Called Alison Brooks back to check on her . Alison Brooks said rt abd discomfort is no worse but about the same ,still denies bleeding ,severe pain or fever. Told Alison Brooks . Dr Henrene Pastor recommended liquid diet and advance as tolerated and that if pain becomes severe or worsens to go to the Emergency Room for Evaluation . Alison Brooks understood.

## 2018-08-30 NOTE — Telephone Encounter (Signed)
Noted.  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  If mild or moderate without fever or severe pain, I recommend liquid diet and advance as tolerated.  However, for severe pain or worsening pain, urgent ER evaluation would be recommended.  Please check on the patient later this afternoon.  Thanks

## 2018-08-30 NOTE — Telephone Encounter (Signed)
  Follow up Call-  Call back number 08/29/2018 07/28/2016  Post procedure Call Back phone  # [phone]  Permission to leave phone message Yes Yes  Some recent data might be hidden     Patient questions:  Do you have a fever, pain , or abdominal swelling? Yes.   Pain Score  6 *  Have you tolerated food without any problems? Yes.    Have you been able to return to your normal activities? Yes.    Do you have any questions about your discharge instructions: Diet   No. Medications  No. Follow up visit  No.  Do you have questions or concerns about your Care? No.  Actions: * If pain score is 4 or above: No action needed, pain 6   .pt says right mid -abdomen is sore especially if she touches that area . Pt denies bleeding or fever and says she has not passed flatus but does not feel tight or feel like she needs to pass gas or have a bowel movement,"just sore".

## 2018-09-06 ENCOUNTER — Encounter: Payer: Self-pay | Admitting: Internal Medicine

## 2018-09-26 ENCOUNTER — Other Ambulatory Visit: Payer: Self-pay

## 2018-09-26 NOTE — Patient Outreach (Signed)
[facility]) Care Management  09/26/2018  TONIANNE FINE Apr 27, 1956 [id number]   Medication Adherence call to Mrs. Clovia Cuff spoke with patient she is due on Rosuvastatin 40 mg she ask if we can call [name] mail order an order this medication for her patient will received with in 7-10 days. Mrs. Letta Kocher is showing past due under [place] health Care  Ins.    [facility] Direct Dial [phone]  Fax [phone] [email or web address]

## 2018-11-21 ENCOUNTER — Other Ambulatory Visit: Payer: Self-pay | Admitting: Family Medicine

## 2018-11-23 ENCOUNTER — Other Ambulatory Visit: Payer: Self-pay | Admitting: Family Medicine

## 2018-11-23 MED ORDER — ESCITALOPRAM OXALATE 20 MG PO TABS: 20.0000 mg | ORAL_TABLET | Freq: Every day | ORAL | 0 refills | Status: DC

## 2018-11-23 NOTE — Telephone Encounter (Signed)
Supply to get to next [facility] and [facility] mail order

## 2018-11-23 NOTE — Telephone Encounter (Signed)
Copied from [place]. Topic: Quick Communication - Rx Refill/Question >> Nov 23, 2018  9:11 AM Virl Axe D wrote: Medication: escitalopram (LEXAPRO) 20 MG tablet / Pt stated that she will not receive her Lexapro refill from her mail order pharmacy until 2/11 or after. She would like to know if Dr. Yong Channel would send in a rx to local pharmacy until [place] on 12/05/18 for med refill appt. She is out of medication. Please advise pt. CB# [phone] Ok to leave VM  Has the patient contacted their pharmacy? Yes.   (Agent: If no, request that the patient contact the pharmacy for the refill.) (Agent: If yes, when and what did the pharmacy advise?)  Preferred Pharmacy (with phone number or street name): [place], [place] [phone] (Phone) [phone] (Fax)  Agent: Please be advised that RX refills may take up to 3 business days. We ask that you follow-up with your pharmacy.

## 2018-12-05 ENCOUNTER — Ambulatory Visit (INDEPENDENT_AMBULATORY_CARE_PROVIDER_SITE_OTHER): Payer: Medicare Other | Admitting: Family Medicine

## 2018-12-05 ENCOUNTER — Encounter: Payer: Self-pay | Admitting: Family Medicine

## 2018-12-05 VITALS — BP 110/62 | HR 61 | Temp 98.7°F | Ht 61.0 in | Wt 153.6 lb

## 2018-12-05 DIAGNOSIS — F3342 Major depressive disorder, recurrent, in full remission: Secondary | ICD-10-CM

## 2018-12-05 DIAGNOSIS — M109 Gout, unspecified: Secondary | ICD-10-CM | POA: Diagnosis not present

## 2018-12-05 DIAGNOSIS — Z79899 Other long term (current) drug therapy: Secondary | ICD-10-CM | POA: Diagnosis not present

## 2018-12-05 DIAGNOSIS — G2581 Restless legs syndrome: Secondary | ICD-10-CM

## 2018-12-05 DIAGNOSIS — I10 Essential (primary) hypertension: Secondary | ICD-10-CM

## 2018-12-05 DIAGNOSIS — E538 Deficiency of other specified B group vitamins: Secondary | ICD-10-CM

## 2018-12-05 DIAGNOSIS — E785 Hyperlipidemia, unspecified: Secondary | ICD-10-CM | POA: Diagnosis not present

## 2018-12-05 MED ORDER — CYANOCOBALAMIN 1000 MCG/ML IJ SOLN: INTRAMUSCULAR | 3 refills | Status: DC

## 2018-12-05 MED ORDER — TOPIRAMATE 200 MG PO TABS: 100.0000 mg | ORAL_TABLET | Freq: Every day | ORAL | 3 refills | Status: DC

## 2018-12-05 NOTE — Patient Instructions (Addendum)
Health Maintenance Due  Topic Date Due  . INFLUENZA VACCINE [facility] in [place] - Sign release of information at the check out desk for [place] to get records 05/25/2018  . MAMMOGRAM Not schedule Pt stated she will handle this A.S.A.P 07/27/2018  ALSO please call for colonoscopy- 3 month repeat  Increase topamax to 200mg  today- no other changes- will reach out to Dr. Henrene Pastor about the higher dose PPI  Please stop by lab before you go If you do not have mychart- we will call you about results within 5 business days of [place] receiving them.  If you have mychart- we will send your results within 3 business days of [place] receiving them.  If abnormal or we want to clarify a result, we will call or mychart you to make sure you receive the message.  If you have questions or concerns or don't hear within 5-7 days, please send [place] a message or call us.

## 2018-12-05 NOTE — Assessment & Plan Note (Signed)
S: Poorly controlled on atorvastatin 40 mg about 5 days a week last visit-changed to rosuvastatin 40 mg- not clear what happened but still see atorvastatin on list and that's what she is taking Lab Results  Component Value Date   CHOL 256 (H) 10/11/2017   HDL 48.20 10/11/2017   LDLCALC 172 (H) 10/11/2017   LDLDIRECT 95.0 11/14/2017   TRIG 178.0 (H) 10/11/2017   CHOLHDL 5 10/11/2017   A/P: update lipids- consider increase to rosuvastatin 40mg  if poorly controlled again

## 2018-12-05 NOTE — Assessment & Plan Note (Signed)
S: PHQ 9 poorly controlled last visit at 24.  Patient is on Lexapro 20 mg-recommended 10-week follow-up last January but now presents February 2020 A/P:  Well controlled (full remission)- phq9 of 0. Continue lexapro at 20mg 

## 2018-12-05 NOTE — Assessment & Plan Note (Signed)
S: Topamax- she increased to 200mg  for restless legs and neuropathy- states hard to even put on shoes.  - prior had tingling in face on higher dose- since going back up hasnt experienced A/P: I refilled higher dose of medication for her as has found more helpful

## 2018-12-05 NOTE — Progress Notes (Signed)
Phone [phone]   Subjective:  Alison Brooks is a 63 y.o. year old very pleasant female patient who presents for/with See problem oriented charting ROS-no recent gout flares.  No chest pain reported.  Still with back pain.  Has burning and tingling in bilateral feet.  Restless legs at night.  Past Medical History-  Patient Active Problem List   Diagnosis Date Noted  . 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 Gout 06/28/2014    Priority: Medium  . Vitamin B12 deficiency 06/28/2014    Priority: Medium  . Multiple pulmonary nodules 06/28/2014    Priority: Medium  . Essential hypertension 05/17/2014    Priority: Medium  . RESTLESS LEG SYNDROME, SEVERE 03/14/2009    Priority: Medium  . Chronic interstitial cystitis 01/20/2009    Priority: Medium  . Depression, major, recurrent ([place]) 03/19/2008    Priority: Medium  . Hyperlipidemia 02/17/2008    Priority: Medium  . Former smoker 12/21/2007    Priority: Medium  . FIBROMYALGIA 04/21/2007    Priority: Medium  . Obesity (BMI 30-39.9) 01/14/2014    Priority: Low  . Lateral epicondylitis (tennis elbow) 01/02/2014    Priority: Low  . GERD (gastroesophageal reflux disease) 03/26/2013    Priority: Low  . LEG CRAMPS 05/16/2009    Priority: Low  . TUBULOVILLOUS ADENOMA, COLON 02/17/2008    Priority: Low  . NEPHROLITHIASIS 02/17/2008    Priority: Low  . MIGRAINE, CLASSICAL W/O INTRACTABLE MIGRAINE 06/09/2007    Priority: Low  . SYMPTOM, MEMORY LOSS 06/09/2007    Priority: Low  . Vertigo 11/06/2016  . Dyspnea 11/02/2014  . Upper airway cough syndrome 11/02/2014    Medications- reviewed and updated Current Outpatient Medications  Medication Sig Dispense Refill  . allopurinol (ZYLOPRIM) 100 MG tablet TAKE 1 TABLET BY MOUTH  DAILY 90 tablet 1  . amLODipine (NORVASC) 2.5 MG tablet Take 1 tablet (2.5 mg total) by mouth daily. 90 tablet 3  . atorvastatin (LIPITOR) 40 MG  tablet     . baclofen (LIORESAL) 10 MG tablet TAKE 1/2 TO 1 TABLET BY  MOUTH 3 TIMES DAILY 180 tablet 1  . cyanocobalamin (,VITAMIN B-12,) 1000 MCG/ML injection 1000 mcg (1 mg) injection once per per month. 30 mL 0  . diclofenac sodium (VOLTAREN) 1 % GEL Apply 4 g topically 4 (four) times daily. (Patient taking differently: Apply 4 g topically as needed. ) 100 g 1  . escitalopram (LEXAPRO) 20 MG tablet Take 1 tablet (20 mg total) by mouth daily. 12 tablet 0  . famotidine (PEPCID) 20 MG tablet Take 1 tablet (20 mg total) by mouth at bedtime. 30 tablet 6  . levothyroxine (SYNTHROID, LEVOTHROID) 50 MCG tablet     . lubiprostone (AMITIZA) 24 MCG capsule Take 1 capsule (24 mcg total) by mouth 2 (two) times daily with a meal. 60 capsule 3  . oxybutynin (DITROPAN) 5 MG tablet TAKE 1 TABLET BY MOUTH 2  TIMES DAILY AS NEEDED FOR  BLADDER SPASMS. 180 tablet 3  . pantoprazole (PROTONIX) 40 MG tablet TAKE 1 TABLET BY MOUTH  DAILY. TAKE 30-60 MIN  BEFORE FIRST MEAL OF THE  DAY 90 tablet 3  . topiramate (TOPAMAX) 100 MG tablet Take 1 tablet (100 mg total) by mouth daily. 90 tablet 3     Objective:  BP 110/62 (BP Location: Left Arm, Patient Position: Sitting, Cuff Size: Normal)   Pulse 61   Temp 98.7 F (  37.1 C) (Oral)   Ht 5' 1"  (1.549 m)   Wt 153 lb 9.6 oz (69.7 kg)   SpO2 96%   BMI 29.02 kg/m  Gen: NAD, resting comfortably CV: RRR no murmurs rubs or gallops Lungs: CTAB no crackles, wheeze, rhonchi Abdomen: soft/nontender/nondistended/normal bowel sounds.  Overweight Ext: no edema Skin: warm, dry    Assessment and Plan     #Hypertension S: Compliant with amlodipine 2.5 mg A/P: Stable. Continue current medications.     #Gout S: Compliant with allopurinol 100 mg. No gout flares recently A/P: Stable. Continue current medications. Update uric acid today  #b12 deficiency S: has been self injecting b12-  A/P: hopefully stable- update b12 today- she needs b12 injections sent to pharmacy - I  sent these in today  #overactive bladder S: OAB using oxybutynin sparingly- helpful when used A/P: Stable. Continue current medications.  Stable. Continue current medications.   #Gerd/history of esophageal stricture S: doing well on protonix 71m A/P: she states Dr. PHenrene Pastormentioned going to BID dosing- I will reach out to him and see if that is what he wants as EGD report said continue current medicines - will reach out to him for his expert opinion.  - also needs to call GI back for colonoscopy - encouraged her today  % on amitiza through GI- doing ok 2020 without medicine  Depression, major, recurrent ([place] S: PHQ 9 poorly controlled last visit at 120  Patient is on Lexapro 20 mg-recommended 10-week follow-up last January but now presents February 2020 A/P:  Well controlled (full remission)- phq9 of 0. Continue lexapro at 241m Hyperlipidemia S: Poorly controlled on atorvastatin 40 mg about 5 days a week last visit-changed to rosuvastatin 40 mg- not clear what happened but still see atorvastatin on list and that's what she is taking Lab Results  Component Value Date   CHOL 256 (H) 10/11/2017   HDL 48.20 10/11/2017   LDLCALC 172 (H) 10/11/2017   LDLDIRECT 95.0 11/14/2017   TRIG 178.0 (H) 10/11/2017   CHOLHDL 5 10/11/2017   A/P: update lipids- consider increase to rosuvastatin 4097mf poorly controlled again  RESTLESS LEG SYNDROME, SEVERE S: Topamax- she increased to 200m23mr restless legs and neuropathy- states hard to even put on shoes.  - prior had tingling in face on higher dose- since going back up hasnt experienced A/P: I refilled higher dose of medication for her as has found more helpful  Future Appointments  Date Time Provider DepaAdak11/2020  1:00 PM HuntMarin Olp [facility]   Return in about 6 months (around 06/05/2019) for physical.  Lab/Order associations: Gout, unspecified cause, unspecified chronicity, unspecified site - Plan: Uric  acid  Hyperlipidemia, unspecified hyperlipidemia type - Plan: CBC, Comp Met (CMET), Lipid panel  Essential hypertension  RESTLESS LEG SYNDROME, SEVERE  Recurrent major depressive disorder, in full remission (H[facility])[place] - Plan: B12  High risk medication use - Plan: B12  Meds ordered this encounter  Medications  . topiramate (TOPAMAX) 200 MG tablet    Sig: Take 0.5 tablets (100 mg total) by mouth daily.    Dispense:  90 tablet    Refill:  3  . cyanocobalamin (,VITAMIN B-12,) 1000 MCG/ML injection    Sig: 1000 mcg (1 mg) injection once per per month.    Dispense:  3 mL    Refill:  3   Return precautions advised.  StepGarret Reddish

## 2018-12-06 ENCOUNTER — Telehealth: Payer: Self-pay

## 2018-12-06 DIAGNOSIS — K219 Gastro-esophageal reflux disease without esophagitis: Secondary | ICD-10-CM

## 2018-12-06 LAB — CBC
HCT: 42.4 % (ref 36.0–46.0)
Hemoglobin: 14.2 g/dL (ref 12.0–15.0)
MCHC: 33.6 g/dL (ref 30.0–36.0)
MCV: 95.1 fl (ref 78.0–100.0)
Platelets: 239 10*3/uL (ref 150.0–400.0)
RBC: 4.46 Mil/uL (ref 3.87–5.11)
RDW: 15 % (ref 11.5–15.5)
WBC: 7.7 10*3/uL (ref 4.0–10.5)

## 2018-12-06 LAB — COMPREHENSIVE METABOLIC PANEL
ALBUMIN: 4.3 g/dL (ref 3.5–5.2)
ALT: 12 U/L (ref 0–35)
AST: 14 U/L (ref 0–37)
Alkaline Phosphatase: 89 U/L (ref 39–117)
BUN: 17 mg/dL (ref 6–23)
CO2: 23 mEq/L (ref 19–32)
Calcium: 9.2 mg/dL (ref 8.4–10.5)
Chloride: 107 mEq/L (ref 96–112)
Creatinine, Ser: 0.87 mg/dL (ref 0.40–1.20)
GFR: 65.35 mL/min (ref >60.00)
Glucose, Bld: 81 mg/dL (ref 70–99)
Potassium: 4.1 mEq/L (ref 3.5–5.1)
Sodium: 138 mEq/L (ref 135–145)
Total Bilirubin: 0.4 mg/dL (ref 0.2–1.2)
Total Protein: 6.4 g/dL (ref 6.0–8.3)

## 2018-12-06 LAB — LIPID PANEL
CHOLESTEROL: 265 mg/dL — AB (ref 0–200)
HDL: 45.9 mg/dL (ref >39.00)
LDL Cholesterol: 188 mg/dL — ABNORMAL HIGH (ref 0–99)
NonHDL: 219.5
TRIGLYCERIDES: 159 mg/dL — AB (ref 0.0–149.0)
Total CHOL/HDL Ratio: 6
VLDL: 31.8 mg/dL (ref 0.0–40.0)

## 2018-12-06 LAB — VITAMIN B12: Vitamin B-12: 207 pg/mL — ABNORMAL LOW (ref 211–911)

## 2018-12-06 LAB — URIC ACID: Uric Acid, Serum: 3.4 mg/dL (ref 2.4–7.0)

## 2018-12-06 MED ORDER — PANTOPRAZOLE SODIUM 40 MG PO TBEC: 40.0000 mg | DELAYED_RELEASE_TABLET | Freq: Two times a day (BID) | ORAL | 3 refills | Status: DC

## 2018-12-06 NOTE — Telephone Encounter (Signed)
-----   Message from Irene Shipper, MD sent at 12/06/2018  9:32 AM EST ----- Thanks Lenon Ahmadi, Please contact patient. If she is needing BID PPI to control symptoms,please change the prescription to reflect such. Thanks. Dr. Mamie Nick ----- Message ----- From: [name], MD Sent: 12/05/2018   8:27 PM EST To: Irene Shipper, MD  Hey Dr. Henrene Pastor- patient asked me to prescribe BID PPI today- she states you told her to have me increase it. I looked at your EGD note and it stated continue current medicine- if you want her to take the BID dose- I would appreciate you sending this in for her. I also encouraged her to call for the 3 month colonoscopy follow up you recommended.  Thanks! Garret Reddish

## 2018-12-06 NOTE — Telephone Encounter (Signed)
Called and spoke with patient-patient reports she needs the RX to be submitted to [name] mail delivery-RX sent;   UPDATE: Patient also reports she is due for a repeat colonoscopy -patient recall verified-patient scheduled for pre visit on 12/26/2018 at 1:30pm; and prop colonoscopy scheduled on 01/09/2019 at 1:30 per patient request;

## 2018-12-06 NOTE — Telephone Encounter (Signed)
Noted! Thank you

## 2018-12-07 MED ORDER — ROSUVASTATIN CALCIUM 40 MG PO TABS: 40.0000 mg | ORAL_TABLET | Freq: Every day | ORAL | 3 refills | Status: DC

## 2018-12-07 NOTE — Addendum Note (Signed)
Addended by: Gwenyth Ober R on: 12/07/2018 03:18 PM   Modules accepted: Orders

## 2018-12-11 ENCOUNTER — Other Ambulatory Visit: Payer: Self-pay | Admitting: Family Medicine

## 2018-12-11 MED ORDER — TOPIRAMATE 200 MG PO TABS: 200.0000 mg | ORAL_TABLET | Freq: Every day | ORAL | 3 refills | Status: DC

## 2018-12-12 ENCOUNTER — Other Ambulatory Visit: Payer: Self-pay | Admitting: *Deleted

## 2018-12-12 ENCOUNTER — Telehealth: Payer: Self-pay | Admitting: Family Medicine

## 2018-12-12 MED ORDER — CYANOCOBALAMIN 1000 MCG/ML IJ SOLN: INTRAMUSCULAR | 3 refills | Status: DC

## 2018-12-12 NOTE — Progress Notes (Signed)
Rx sent to wrong pharmacy- Rx re- sent to preferred pharmacy for this Rx

## 2018-12-12 NOTE — Telephone Encounter (Signed)
Copied from [place] [phone]. Topic: Quick Communication - See Telephone Encounter >> Dec 12, 2018  1:04 PM Sheran Luz wrote: CRM for notification. See Telephone encounter for: 12/12/18.   Patient states that the medication cyanocobalamin (,VITAMIN B-12,) 1000 MCG/ML injection was sent to wrong pharmacy. Patient is requesting this medication be resent to pharmacy below. Please advise.   Preferred pharmacy:[facility] [place], [place] - [place] [address] [phone] (Phone) [phone] (Fax)

## 2018-12-12 NOTE — Telephone Encounter (Signed)
Rx redirected form mail order to local pharmacy per patient request.

## 2018-12-23 ENCOUNTER — Encounter: Payer: Self-pay | Admitting: Internal Medicine

## 2018-12-26 ENCOUNTER — Encounter: Payer: Self-pay | Admitting: Internal Medicine

## 2018-12-26 ENCOUNTER — Ambulatory Visit (AMBULATORY_SURGERY_CENTER): Payer: Self-pay

## 2018-12-26 VITALS — Ht 62.0 in | Wt 154.0 lb

## 2018-12-26 DIAGNOSIS — Z86010 Personal history of colonic polyps: Secondary | ICD-10-CM

## 2018-12-26 MED ORDER — NA SULFATE-K SULFATE-MG SULF 17.5-3.13-1.6 GM/177ML PO SOLN: 1.0000 | Freq: Once | ORAL | 0 refills | Status: AC

## 2018-12-26 NOTE — Progress Notes (Signed)
Denies allergies to eggs or soy products. Denies complication of anesthesia or sedation. Denies use of weight loss medication. Denies use of O2.   [name] instructions declined.    A sample of Suprep was given to the patient.  

## 2019-01-08 ENCOUNTER — Telehealth: Payer: Self-pay | Admitting: *Deleted

## 2019-01-08 NOTE — Telephone Encounter (Signed)
Covid-19 travel screening questions  Have you traveled in the last 14 days? NO If yes where?  Do you now or have you had a fever in the last 14 days?  NO FEVERS  Do you have any respiratory symptoms of shortness of breath or cough now or in the last 14 days?  NO SYMPTOMS  Do you have a medical history of Congestive Heart Failure?  Do you have a medical history of lung disease?  Do you have any family members or close contacts with diagnosed or suspected Covid-19?  NO       

## 2019-01-09 ENCOUNTER — Other Ambulatory Visit: Payer: Self-pay

## 2019-01-09 ENCOUNTER — Encounter: Payer: Self-pay | Admitting: Internal Medicine

## 2019-01-09 ENCOUNTER — Ambulatory Visit (AMBULATORY_SURGERY_CENTER): Payer: Medicare Other | Admitting: Internal Medicine

## 2019-01-09 VITALS — BP 136/70 | HR 73 | Temp 98.4°F | Resp 11 | Ht 62.0 in | Wt 154.0 lb

## 2019-01-09 DIAGNOSIS — Z860100 Personal history of colon polyps, unspecified: Secondary | ICD-10-CM

## 2019-01-09 DIAGNOSIS — D123 Benign neoplasm of transverse colon: Secondary | ICD-10-CM

## 2019-01-09 DIAGNOSIS — G2581 Restless legs syndrome: Secondary | ICD-10-CM | POA: Diagnosis not present

## 2019-01-09 MED ORDER — SODIUM CHLORIDE 0.9 % IV SOLN: 500.0000 mL | Freq: Once | INTRAVENOUS | Status: DC

## 2019-01-09 NOTE — Patient Instructions (Addendum)
Handout for polyps given.  Please call Dr Blanch Media office in early May to schedule repeat colonoscopy for June 17th or later.    YOU HAD AN ENDOSCOPIC PROCEDURE TODAY AT [facility]:   Refer to the procedure report that was given to you for any specific questions about what was found during the examination.  If the procedure report does not answer your questions, please call your gastroenterologist to clarify.  If you requested that your care partner not be given the details of your procedure findings, then the procedure report has been included in a sealed envelope for you to review at your convenience later.  YOU SHOULD EXPECT: Some feelings of bloating in the abdomen. Passage of more gas than usual.  Walking can help get rid of the air that was put into your GI tract during the procedure and reduce the bloating. If you had a lower endoscopy (such as a colonoscopy or flexible sigmoidoscopy) you may notice spotting of blood in your stool or on the toilet paper. If you underwent a bowel prep for your procedure, you may not have a normal bowel movement for a few days.  Please Note:  You might notice some irritation and congestion in your nose or some drainage.  This is from the oxygen used during your procedure.  There is no need for concern and it should clear up in a day or so.   SYMPTOMS TO REPORT IMMEDIATELY:   Following lower endoscopy (colonoscopy or flexible sigmoidoscopy):  Excessive amounts of blood in the stool  Significant tenderness or worsening of abdominal pains  Swelling of the abdomen that is new, acute  Fever of 100F or higher    For urgent or emergent issues, a gastroenterologist can be reached at any hour by calling [phone].   DIET:  We do recommend a small meal at first, but then you may proceed to your regular diet.  Drink plenty of fluids but you should avoid alcoholic beverages for 24 hours.  ACTIVITY:  You should plan to take it easy for the  rest of today and you should NOT DRIVE or use heavy machinery until tomorrow (because of the sedation medicines used during the test).    FOLLOW UP: Our staff will call the number listed on your records the next business day following your procedure to check on you and address any questions or concerns that you may have regarding the information given to you following your procedure. If we do not reach you, we will leave a message.  However, if you are feeling well and you are not experiencing any problems, there is no need to return our call.  We will assume that you have returned to your regular daily activities without incident.  If any biopsies were taken you will be contacted by phone or by letter within the next 1-3 weeks.  Please call us at [phone] if you have not heard about the biopsies in 3 weeks.    SIGNATURES/CONFIDENTIALITY: You and/or your care partner have signed paperwork which will be entered into your electronic medical record.  These signatures attest to the fact that that the information above on your After Visit Summary has been reviewed and is understood.  Full responsibility of the confidentiality of this discharge information lies with you and/or your care-partner.

## 2019-01-09 NOTE — Op Note (Signed)
[facility] Patient Name: Berdena Cisek Procedure Date: 01/09/2019 1:12 PM MRN: [id number] Endoscopist: Docia Chuck. Henrene Pastor , MD Age: 63 Referring MD:  Date of Birth: [birth year] Gender: Female Account #: [id number] Procedure:                Colonoscopy with cold biopsy; with hot snare                            polypectomy Indications:              High risk colon cancer surveillance: Personal                            history of adenoma (10 mm or greater in size). 50                            mm laterally spreading tumor removed piecemeal                            November 2019. Now for follow-up Medicines:                Monitored Anesthesia Care Procedure:                Pre-Anesthesia Assessment:                           - Prior to the procedure, a History and Physical                            was performed, and patient medications and                            allergies were reviewed. The patient's tolerance of                            previous anesthesia was also reviewed. The risks                            and benefits of the procedure and the sedation                            options and risks were discussed with the patient.                            All questions were answered, and informed consent                            was obtained. Prior Anticoagulants: The patient has                            taken no previous anticoagulant or antiplatelet                            agents. ASA Grade Assessment: II - A patient with  mild systemic disease. After reviewing the risks                            and benefits, the patient was deemed in                            satisfactory condition to undergo the procedure.                           After obtaining informed consent, the colonoscope                            was passed under direct vision. Throughout the                            procedure, the patient's blood pressure,  pulse, and                            oxygen saturations were monitored continuously. The                            Colonoscope was introduced through the anus and                            advanced to the the cecum, identified by                            appendiceal orifice and ileocecal valve. The                            ileocecal valve, appendiceal orifice, and rectum                            were photographed. The quality of the bowel                            preparation was excellent. The colonoscopy was                            performed without difficulty. The patient tolerated                            the procedure well. The bowel preparation used was                            SUPREP. Scope In: 1:18:43 PM Scope Out: 1:42:38 PM Scope Withdrawal Time: 0 hours 19 minutes 54 seconds  Total Procedure Duration: 0 hours 23 minutes 55 seconds  Findings:                 Previous marking tattoo was easily identified. As                            well prior polypectomy site with residual polypoid  tissue measuring 8 mm in length and 3 mm in width                            polyp was found in the hepatic flexure. The polyp                            was sessile and slightly friable. Biopsies were                            taken with a cold forceps for histology. The polyp                            was then removed with a hot snare and cauterized.                            Resection and retrieval were complete.                           The entire examined colon appeared normal on direct                            and retroflexion views. Complications:            No immediate complications. Estimated blood loss:                            None. Estimated Blood Loss:     Estimated blood loss: none. Impression:               - Residual polyp at the hepatic flexure, removed                            with a hot snare. Resected and retrieved.  Biopsied.                           - The entire examined colon is normal on direct and                            retroflexion views. Recommendation:           - Repeat colonoscopy in 3 months for surveillance.                           - Patient has a contact number available for                            emergencies. The signs and symptoms of potential                            delayed complications were discussed with the                            patient. Return to normal activities tomorrow.  Written discharge instructions were provided to the                            patient.                           - Resume previous diet.                           - Continue present medications.                           - Await pathology results. Docia Chuck. Henrene Pastor, MD 01/09/2019 2:01:40 PM This report has been signed electronically.

## 2019-01-09 NOTE — Progress Notes (Signed)
Called to room to assist during endoscopic procedure.  Patient ID and intended procedure confirmed with present staff. Received instructions for my participation in the procedure from the performing physician.  

## 2019-01-09 NOTE — Progress Notes (Signed)
Hepatic flexure prior polypectomy site Bot #1 CB taken, Bot #2 HS from same polyp as bot #1.  maw

## 2019-01-09 NOTE — Progress Notes (Signed)
Report to PACU, RN, vss, BBS= Clear.  

## 2019-01-09 NOTE — Progress Notes (Signed)
Pt's states no medical or surgical changes since previsit or office visit. 

## 2019-01-10 ENCOUNTER — Telehealth: Payer: Self-pay

## 2019-01-10 NOTE — Telephone Encounter (Signed)
  Follow up Call-  Call back number 01/09/2019 08/29/2018 07/28/2016  Post procedure Call Back phone  # [phone]  Permission to leave phone message Yes Yes Yes  Some recent data might be hidden     Patient questions:  Do you have a fever, pain , or abdominal swelling? No. Pain Score  0 *  Have you tolerated food without any problems? Yes.    Have you been able to return to your normal activities? Yes.    Do you have any questions about your discharge instructions: Diet   No. Medications  No. Follow up visit  No.  Do you have questions or concerns about your Care? No.  Actions: * If pain score is 4 or above: No action needed, pain <4.

## 2019-01-12 ENCOUNTER — Encounter: Payer: Self-pay | Admitting: Internal Medicine

## 2019-01-15 ENCOUNTER — Telehealth: Payer: Self-pay | Admitting: Family Medicine

## 2019-01-15 ENCOUNTER — Other Ambulatory Visit: Payer: Self-pay

## 2019-01-15 DIAGNOSIS — E538 Deficiency of other specified B group vitamins: Secondary | ICD-10-CM

## 2019-01-15 MED ORDER — CYANOCOBALAMIN 1000 MCG/ML IJ SOLN: INTRAMUSCULAR | 0 refills | Status: DC

## 2019-01-15 NOTE — Telephone Encounter (Signed)
Copied from [place] [phone]. Topic: Quick Communication - Rx Refill/Question >> Jan 15, 2019 10:10 AM Richardo Priest, NT wrote: Medication: cyanocobalamin (,VITAMIN B-12,) 1000 MCG/ML injection  Has the patient contacted their pharmacy? Yes, patient would like to speak to doctor/nurse about increasing frequency of injection as well as getting refill  Preferred Pharmacy (with phone number or street name): patient will advise when receiving phone call  Agent: Please be advised that RX refills may take up to 3 business days. We ask that you follow-up with your pharmacy.

## 2019-01-15 NOTE — Telephone Encounter (Signed)
See note

## 2019-02-07 ENCOUNTER — Ambulatory Visit (INDEPENDENT_AMBULATORY_CARE_PROVIDER_SITE_OTHER): Payer: Medicare Other | Admitting: Family Medicine

## 2019-02-07 ENCOUNTER — Encounter: Payer: Self-pay | Admitting: Family Medicine

## 2019-02-07 VITALS — Temp 99.5°F | Ht 62.0 in | Wt 148.0 lb

## 2019-02-07 DIAGNOSIS — M5441 Lumbago with sciatica, right side: Secondary | ICD-10-CM | POA: Diagnosis not present

## 2019-02-07 DIAGNOSIS — G8929 Other chronic pain: Secondary | ICD-10-CM

## 2019-02-07 DIAGNOSIS — H6690 Otitis media, unspecified, unspecified ear: Secondary | ICD-10-CM | POA: Diagnosis not present

## 2019-02-07 MED ORDER — TOPIRAMATE 200 MG PO TABS: 200.0000 mg | ORAL_TABLET | Freq: Every day | ORAL | 3 refills | Status: DC

## 2019-02-07 MED ORDER — AMOXICILLIN-POT CLAVULANATE 875-125 MG PO TABS: 1.0000 | ORAL_TABLET | Freq: Two times a day (BID) | ORAL | 0 refills | Status: AC

## 2019-02-07 NOTE — Patient Instructions (Addendum)
Health Maintenance Due  Topic Date Due  . MAMMOGRAM  07/27/2018  . DEXA SCAN  12/21/2018  . PNA vac Low Risk Adult (1 of 2 - PCV13) 12/21/2018  will get HM updated at august visit hopefully   Video visit

## 2019-02-07 NOTE — Assessment & Plan Note (Signed)
S: prior on methadone but was able to be weaned off. Neurosurgery in the past. Had spinal cord stimulator but believe nonfunctioning. topamax has been helpful for nerve pain and restless legs. Symptoms stable A/P: doing well- did 90 day refill for topamax to mail order

## 2019-02-07 NOTE — Progress Notes (Addendum)
Phone [phone]   Subjective:  Virtual visit via Video note. Chief complaint: Chief Complaint  Patient presents with  . Ear Pain    Lt ear. x2DAYS  . Medication Refill    Topiramate 200mg .  Need 90 day to [name]    This visit type was conducted due to national recommendations for restrictions regarding the COVID-19 Pandemic (e.g. social distancing).  This format is felt to be most appropriate for this patient at this time balancing risks to patient and risks to population by having him in for in person visit.  No physical exam was performed (except for noted visual exam or audio findings with Telehealth visits).    Our team/I connected with [name] on 02/07/19 at 11:20 AM EDT by a video enabled telemedicine application ([email or web address]) and verified that I am speaking with the correct person using two identifiers.  Location patient: Work Location provider: Garment/textile technologist, office Persons participating in the virtual visit:  patient  Our team/I discussed the limitations of evaluation and management by telemedicine and the availability of in person appointments. In light of current covid-19 pandemic, patient also understands that we are trying to protect them by minimizing in office contact if at all possible.  The patient expressed consent for telemedicine visit and agreed to proceed. Patient understands insurance will be billed.   ROS- subjective fever, dry cough, left ear pain noted. No shortness of breath reported. Some headache.    Past Medical History-  Patient Active Problem List   Diagnosis Date Noted  . 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 Gout 06/28/2014    Priority: Medium  . Vitamin B12 deficiency 06/28/2014    Priority: Medium  . Multiple pulmonary nodules 06/28/2014    Priority: Medium  . Essential hypertension 05/17/2014    Priority: Medium  . RESTLESS LEG SYNDROME, SEVERE 03/14/2009    Priority: Medium   . Chronic interstitial cystitis 01/20/2009    Priority: Medium  . Depression, major, recurrent ([place]) 03/19/2008    Priority: Medium  . Hyperlipidemia 02/17/2008    Priority: Medium  . Former smoker 12/21/2007    Priority: Medium  . FIBROMYALGIA 04/21/2007    Priority: Medium  . Obesity (BMI 30-39.9) 01/14/2014    Priority: Low  . Lateral epicondylitis (tennis elbow) 01/02/2014    Priority: Low  . GERD (gastroesophageal reflux disease) 03/26/2013    Priority: Low  . LEG CRAMPS 05/16/2009    Priority: Low  . TUBULOVILLOUS ADENOMA, COLON 02/17/2008    Priority: Low  . NEPHROLITHIASIS 02/17/2008    Priority: Low  . MIGRAINE, CLASSICAL W/O INTRACTABLE MIGRAINE 06/09/2007    Priority: Low  . SYMPTOM, MEMORY LOSS 06/09/2007    Priority: Low  . Vertigo 11/06/2016  . Dyspnea 11/02/2014  . Upper airway cough syndrome 11/02/2014    Medications- reviewed and updated Current Outpatient Medications  Medication Sig Dispense Refill  . allopurinol (ZYLOPRIM) 100 MG tablet TAKE 1 TABLET BY MOUTH  DAILY 90 tablet 1  . amLODipine (NORVASC) 2.5 MG tablet Take 1 tablet (2.5 mg total) by mouth daily. 90 tablet 3  . baclofen (LIORESAL) 10 MG tablet TAKE 1/2 TO 1 TABLET BY  MOUTH 3 TIMES DAILY 180 tablet 1  . cyanocobalamin (,VITAMIN B-12,) 1000 MCG/ML injection 1000 mcg (1 mg) injection once per per month. 30 mL 0  . diclofenac sodium (VOLTAREN) 1 % GEL Apply 4 g topically 4 (four) times daily. (Patient taking  differently: Apply 4 g topically as needed. ) 100 g 1  . escitalopram (LEXAPRO) 20 MG tablet Take 1 tablet (20 mg total) by mouth daily. 12 tablet 0  . famotidine (PEPCID) 20 MG tablet Take 1 tablet (20 mg total) by mouth at bedtime. 30 tablet 6  . levothyroxine (SYNTHROID, LEVOTHROID) 50 MCG tablet     . lubiprostone (AMITIZA) 24 MCG capsule Take 1 capsule (24 mcg total) by mouth 2 (two) times daily with a meal. 60 capsule 3  . oxybutynin (DITROPAN) 5 MG tablet TAKE 1 TABLET BY MOUTH 2   TIMES DAILY AS NEEDED FOR  BLADDER SPASMS. 180 tablet 3  . pantoprazole (PROTONIX) 40 MG tablet Take 1 tablet (40 mg total) by mouth 2 (two) times daily. TAKE 30-60 MIN  BEFORE MEAL 180 tablet 3  . rosuvastatin (CRESTOR) 40 MG tablet Take 1 tablet (40 mg total) by mouth daily. 90 tablet 3  . topiramate (TOPAMAX) 200 MG tablet Take 1 tablet (200 mg total) by mouth daily. 90 tablet 3     Objective:  Temp 99.5 F (37.5 C) (Oral)   Ht 5\' 2"  (1.575 m)   Wt 148 lb (67.1 kg)   BMI 27.07 kg/m  Gen: NAD, resting comfortably External ear normal, external nose normal Lungs: nonlabored, normal respiratory rate  Skin: appears dry, no obvious rash Normal speech    Assessment and Plan   # Left ear pain S:patient started with pain in her ear about 2 days ago. Has tried ibuprofen and sweet oil with minimal relief- and no significant discharge.  Mild left sided headache today as well as temperature up to 99.5. no recent swimming. No hearing loss with this. Mild dry cough noted as well A/P: Having patient in for ear exam would be ideal but we are limited with covid 19 and not having adequate PPE. Given ear pain and elevated temperature as main symptoms of illness- we discussed benefits versus risk of sending inempiric treatment for acute otitis media- after discussion she opts in- we will use augmentin.   Unfortunately, we cannot exclude covid-19 as potential cause of symptoms. 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 Neurosurgery in the past. Had  spinal cord stimulator but believe nonfunctioning. topamax has been helpful for nerve pain and restless legs. Symptoms stable A/P: doing well- did 90 day refill for topamax to mail order   Future Appointments  Date Time Provider [place]  06/05/2019  1:00 PM [name], MD [facility]   Meds ordered this encounter  Medications  . amoxicillin-clavulanate (AUGMENTIN) 875-125 MG tablet    Sig: Take 1 tablet by mouth 2 (two) times daily for 7 days.    Dispense:  14 tablet    Refill:  0  . topiramate (TOPAMAX) 200 MG tablet    Sig: Take 1 tablet (200 mg total) by mouth daily.    Dispense:  90 tablet    Refill:  3   Return precautions advised.  Garret Reddish,  MD  Addendum 02/08/2019 Spoke with patient and she states her ear is doing much better.  She is no longer having subjective fevers.  She is thankful for the antibiotic.  Perhaps very mild dizziness.  She will let us know if she has worsening symptoms.  Reinforced self quarantine just to be on the safe side.  Garret Reddish 02/08/19

## 2019-02-13 ENCOUNTER — Other Ambulatory Visit: Payer: Self-pay | Admitting: Family Medicine

## 2019-04-23 DIAGNOSIS — E039 Hypothyroidism, unspecified: Secondary | ICD-10-CM | POA: Diagnosis not present

## 2019-06-05 ENCOUNTER — Encounter: Payer: Medicare Other | Admitting: Family Medicine

## 2019-07-16 NOTE — Progress Notes (Signed)
Phone: [phone]   Subjective:  Patient presents today for their annual physical. Chief complaint-noted.   See problem oriented charting- ROS- full  review of systems was completed and negative except for: Fatigue, sore throat, cough, back pain  The following were reviewed and entered/updated in epic: Past Medical History:  Diagnosis Date  . Anemia   . Anxiety   . Arthritis    back, neck  . Cancer ([place])    skin cancer- squamous cell- left arm and shoulder  . Chronic back pain   . Chronic kidney disease    kidney stones  . COPD (chronic obstructive pulmonary disease) ([place])   . Depression   . Fibromyalgia   . GERD (gastroesophageal reflux disease)   . Heart murmur   . Hematuria   . History of adenomatous polyp of colon   . History of kidney stones   . History of seizure    x1 2007 post op (per pt neurologist work-up done ?mixture of anesthesia medication and prozac that pt had taken)  and has not any issues since   . History of squamous cell carcinoma excision   . Hyperlipidemia   . Hypertension   . IC (interstitial cystitis)   . Migraine   . Osteoporosis   . Pulmonary nodules    multiple per ct -- monitored by pcp  . Right ureteral stone   . RLS (restless legs syndrome)   . Seizures ([place])    one time incident- greater than 10 years ago, none since  . Thyroid disease    Patient Active Problem List   Diagnosis Date Noted  . 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 Gout 06/28/2014    Priority: Medium  . Vitamin B12 deficiency 06/28/2014    Priority: Medium  . Multiple pulmonary nodules 06/28/2014    Priority: Medium  . Essential hypertension 05/17/2014    Priority: Medium  . RESTLESS LEG SYNDROME, SEVERE 03/14/2009    Priority: Medium  . Chronic interstitial cystitis 01/20/2009    Priority: Medium  . Depression, major, recurrent ([place]) 03/19/2008    Priority: Medium  . Hyperlipidemia 02/17/2008   Priority: Medium  . Former smoker 12/21/2007    Priority: Medium  . FIBROMYALGIA 04/21/2007    Priority: Medium  . Obesity (BMI 30-39.9) 01/14/2014    Priority: Low  . Lateral epicondylitis (tennis elbow) 01/02/2014    Priority: Low  . GERD (gastroesophageal reflux disease) 03/26/2013    Priority: Low  . LEG CRAMPS 05/16/2009    Priority: Low  . TUBULOVILLOUS ADENOMA, COLON 02/17/2008    Priority: Low  . NEPHROLITHIASIS 02/17/2008    Priority: Low  . MIGRAINE, CLASSICAL W/O INTRACTABLE MIGRAINE 06/09/2007    Priority: Low  . SYMPTOM, MEMORY LOSS 06/09/2007    Priority: Low  . History of total hysterectomy 07/17/2019  . Hypothyroidism 07/17/2019  . Vertigo 11/06/2016  . Dyspnea 11/02/2014  . Upper airway cough syndrome 11/02/2014   Past Surgical History:  Procedure Laterality Date  . CARDIOVASCULAR STRESS TEST  09-02-2004   normal perfusion study/  normal LV function and wall motion, ef 64%  . CARPAL TUNNEL RELEASE Right   . CERVICAL FUSION  1992  . CHOLECYSTECTOMY  1995  . COLONOSCOPY    . CYSTO/  BOTOX INJECTION  12-27-2008  &  04-01-2006  . CYSTO/  HYDRODISTENTION/  INSTILLATION THERAPY  03-04-2006  . CYSTOSCOPY W/ URETERAL STENT PLACEMENT Right 04/02/2016   Procedure:  CYSTOSCOPY WITH RIGHT RETROGRADE, URETEROSCOPY PYELOGRAM LASER LITHOTRIPSY,/URETERAL STENT PLACEMENT;  Surgeon: Kathie Rhodes, MD;  Location: [place] [facility];  Service: Urology;  Laterality: Right;  . HEMORRHOID SURGERY  01-03-2004  . HOLMIUM LASER APPLICATION Right [id number]   Procedure: HOLMIUM LASER APPLICATION;  Surgeon: Kathie Rhodes, MD;  Location[facility];  Service: Urology;  Laterality: Right;  . LUMBAR FUSION  1996   L4 --S1  . ROTATOR CUFF REPAIR Left 2004  . SHOULDER ARTHROSCOPY WITH BICEPS TENDON REPAIR Left 03/ 2017  . SPINAL CORD STIMULATOR IMPLANT  x4  last one 2013  . STONE EXTRACTION WITH BASKET Right 04/02/2016   Procedure: STONE EXTRACTION WITH BASKET;   Surgeon: Kathie Rhodes, MD;  Location: [facility];  Service: Urology;  Laterality: Right;  . TONSILLECTOMY  1964  . TRANSTHORACIC ECHOCARDIOGRAM  04-17-2014   EF 60-65%/  trivial AR, MR, and TR  . UPPER GASTROINTESTINAL ENDOSCOPY    . VAGINAL HYSTERECTOMY  1984  . WRIST GANGLION EXCISION Right 02-05-2004    Family History  Problem Relation Age of Onset  . Heart attack Mother        Mom 47, Dad 57  . Hypertension Mother   . Heart disease Mother   . Heart disease Father   . Diabetes Father   . Colon cancer Maternal Grandmother        not sure age of onset  . Esophageal cancer Neg Hx   . Rectal cancer Neg Hx   . Stomach cancer Neg Hx     Medications- reviewed and updated Current Outpatient Medications  Medication Sig Dispense Refill  . allopurinol (ZYLOPRIM) 100 MG tablet TAKE 1 TABLET BY MOUTH  DAILY 90 tablet 1  . amLODipine (NORVASC) 2.5 MG tablet Take 1 tablet (2.5 mg total) by mouth daily. 90 tablet 3  . baclofen (LIORESAL) 10 MG tablet TAKE 1/2 TO 1 TABLET BY  MOUTH 3 TIMES DAILY 180 tablet 1  . cyanocobalamin (,VITAMIN B-12,) 1000 MCG/ML injection 1000 mcg (1 mg) injection once per per month. 30 mL 0  . diclofenac sodium (VOLTAREN) 1 % GEL Apply 4 g topically 4 (four) times daily. (Patient taking differently: Apply 4 g topically as needed. ) 100 g 1  . escitalopram (LEXAPRO) 20 MG tablet TAKE 1 TABLET BY MOUTH  DAILY 90 tablet 1  . famotidine (PEPCID) 20 MG tablet Take 1 tablet (20 mg total) by mouth at bedtime. 30 tablet 6  . levothyroxine (SYNTHROID, LEVOTHROID) 50 MCG tablet     . lubiprostone (AMITIZA) 24 MCG capsule Take 1 capsule (24 mcg total) by mouth 2 (two) times daily with a meal. 60 capsule 3  . oxybutynin (DITROPAN) 5 MG tablet TAKE 1 TABLET BY MOUTH 2  TIMES DAILY AS NEEDED FOR  BLADDER SPASMS. 180 tablet 3  . rosuvastatin (CRESTOR) 40 MG tablet Take 1 tablet (40 mg total) by mouth daily. 90 tablet 3  . topiramate (TOPAMAX) 200 MG tablet Take 1  tablet (200 mg total) by mouth daily. 90 tablet 3  . pantoprazole (PROTONIX) 40 MG tablet Take 1 tablet (40 mg total) by mouth 2 (two) times daily. TAKE 30-60 MIN  BEFORE MEAL 180 tablet 3   No current facility-administered medications for this visit.     Allergies-reviewed and updated Allergies  Allergen Reactions  . Latex Other (See Comments)    blisters  . Oxycodone Nausea And Vomiting    And sweating  . Zofran [Ondansetron Hcl] Other (See Comments)  headache    Social History   Social History Narrative   Lives with spouse, Antony Haste.    Caffeine use: 1-2 cups coffee per day   Married 47 years in December 2020. 1 daughter. 2 grandkids (boy and girl). Lives 10 miles away.       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       Hobbies: previously enjoyed dancing, old car shoes (74 chevy), reading   Objective  Objective:  BP 136/80   Pulse 65   Temp 98 F (36.7 C)   Ht 5\' 2"  (1.575 m)   Wt 151 lb (68.5 kg)   SpO2 99%   BMI 27.62 kg/m  Gen: NAD, resting comfortably HEENT: Mucous membranes are moist. Oropharynx normal Neck: no thyromegaly CV: RRR no murmurs rubs or gallops Lungs: CTAB no crackles, wheeze, rhonchi Abdomen: soft/nontender/nondistended/normal bowel sounds. No rebound or guarding.  Ext: no edema, 2+ PT pulses Skin: warm, dry Neuro: grossly normal, moves all extremities, PERRLA Msk: slower to get up from table due to back discomfort    Assessment and Plan   63 y.o. female presenting for annual physical.  Health Maintenance counseling: 1. Anticipatory guidance: Patient counseled regarding regular dental exams -q6 months, eye exams - every few years,  avoiding smoking and second hand smoke , limiting alcohol to 1 beverage per day .   2. Risk factor reduction:  Advised patient of need for regular exercise and diet rich and fruits and vegetables to reduce risk of heart attack and stroke. Exercise- pt states she has not been exercising at present- tied up  with caregiving. Encouraged her to take time for herself and get exercise in- had been walking prior.   Diet-the patient states her diet is pretty good and she is doing more cooking. Down 9 lbs over last year and attributes to healthier diet.  Wt Readings from Last 3 Encounters:  07/17/19 151 lb (68.5 kg)  02/07/19 148 lb (67.1 kg)  01/09/19 154 lb (69.9 kg)   3. Immunizations/screenings/ancillary studies- flu shot and pneumovax 23 today. Still due for shingrix- defer for now.  Immunization History  Administered Date(s) Administered  . Influenza Whole 08/11/1999, 08/11/2007, 07/25/2009  . Influenza,inj,Quad PF,6+ Mos 09/03/2013, 07/12/2014, 08/04/2015  . Influenza-Unspecified 07/25/2017  . Td 10/25/1998, 10/25/2006  . Tdap 04/20/2018  4. Cervical cancer screening- past age based screening recs- denies history fo abnormal. hysterectomy for benign reasons years ago- she was told pap smears not needed- she believes cervix removed.  5. Breast cancer screening-   mammogram pt will get mammogram scheduled- she is overdue with [facility] 6. Colon cancer screening -  01/09/2019 with 3 month follow up- advised her to call to schedule- we will also place referral.  7. Skin cancer screening- sees derm yearly- Dr. [place] Crane. advised regular sunscreen use. Denies worrisome, changing, or new skin lesions.  8. Birth control/STD check- postmenopausal/monogomous 9. Osteoporosis screening at 30-  screen today -former smoker- over 30 pack years- quit 2009. Advised lung cancer screening program - she agrees. Also get urine  Status of chronic or acute concerns   #hypertension S: controlled on  amlodipine 2.5 mg BP Readings from Last 3 Encounters:  07/17/19 136/80  01/09/19 136/70  12/05/18 110/62  A/P: Stable. Continue current medications.   # Depression S: well controlled on lexapro 20mg .  Depression screen [facility] 2/9 07/17/2019  Decreased Interest 0  Down, Depressed, Hopeless 0  PHQ - 2 Score 0  Altered sleeping  0  Tired, decreased  energy 1  Change in appetite 0  Feeling bad or failure about yourself  0  Trouble concentrating 0  Moving slowly or fidgety/restless 0  Suicidal thoughts 0  PHQ-9 Score 1  Difficult doing work/chores Not difficult at all  A/P:  well controlled- continue with escitalopram 20mg - could consider dose reduction outside of covid 19 pandemic.   Gout- compliant with allopurinol - last uric acid well controlled. Skip test today Lab Results  Component Value Date   LABURIC 3.4 12/05/2018   #hypothyroidism S: On thyroid medication- levothyroxine 50 mcg  Lab Results  Component Value Date   TSH 3.00 12/25/2015   A/P: update TSH today- hopefully controlled  Constipation-on amitiza  OAB- on oxybutynin  #hyperlipidemia S: hopefully controlled on rosuvastaitn 40 mg- increased from atorvastatin 40mg   A/P: update lipid panel today- discussed has not been full year yet but she prefers full panel.   # b12 deficiency S: patient doing b12 injections at home once a month Lab Results  Component Value Date   VITAMINB12 207 (L) 12/05/2018  A/P:  last b12 low- update today and if low- likely will simply need to be more consistent with dosing- she misses occasionally  pennsaid/voltaren for polyarthralgia by rheum in past- we an prescribe if needed   Restless legs on topamax- working well  Former smoker- get UA. Has interstitial cystitis which may cause [id number]  Recommended follow up: 6 months follow up or sooner if needed  Lab/Order associations: fasting   ICD-10-CM   1. Preventative health care  Z00.00 CBC    Comprehensive metabolic panel    TSH    Vitamin B12    POCT Urinalysis Dipstick (Automated)    Ambulatory Referral for Lung Cancer Scre    LDL cholesterol, direct    CANCELED: Lipid panel  2. Idiopathic gout of left foot, unspecified chronicity  M10.072   3. Essential hypertension  I10 CBC    Comprehensive metabolic panel    CANCELED: Lipid panel   4. Hyperlipidemia, unspecified hyperlipidemia type  E78.5 CBC    Comprehensive metabolic panel    LDL cholesterol, direct    CANCELED: Lipid panel  5. Recurrent major depressive disorder, in full remission ([place])  F33.42   6. Vitamin B12 deficiency  E53.8 Vitamin B12  7. RESTLESS LEG SYNDROME, SEVERE  G25.81   8. Former smoker  Z87.891 POCT Urinalysis Dipstick (Automated)    Ambulatory Referral for Lung Cancer Scre  9. Hypothyroidism, unspecified type  E03.9 TSH  10. Postmenopausal  Z78.0 DG Bone Density  11. History of total hysterectomy  Z90.710   12. History of adenomatous polyp of colon  Z86.010 Ambulatory referral to Gastroenterology    No orders of the defined types were placed in this encounter.   Return precautions advised.  Garret Reddish, MD

## 2019-07-16 NOTE — Patient Instructions (Addendum)
Health Maintenance Due  Topic Date Due  . MAMMOGRAM -need to schedule- please call [facility] and get this updated- last one on our files is over 65 years old.  07/27/2018  . DEXA SCAN - Schedule your bone density test at check out desk. You may also call directly to X-ray at [phone] to schedule an appointment that is convenient for you.  - located [address] across the street from [place] - in the basement - you do need an appointment for the bone density tests.    12/21/2018  . PNA vac Low Risk Adult - pneumovax 23 today 12/21/2018  . INFLUENZA VACCINE -today 05/26/2019   We will call you within two weeks about your referral to GI and lung cancer screening program. If you do not hear within 3 weeks, give [place] a call.   Please stop by lab before you go If you do not have mychart- we will call you about results within 5 business days of [place] receiving them.  If you have mychart- we will send your results within 3 business days of [place] receiving them.  If abnormal or we want to clarify a result, we will call or mychart you to make sure you receive the message.  If you have questions or concerns or don't hear within 5-7 days, please send [place] a message or call us.

## 2019-07-17 ENCOUNTER — Encounter: Payer: Self-pay | Admitting: Family Medicine

## 2019-07-17 ENCOUNTER — Other Ambulatory Visit: Payer: Self-pay

## 2019-07-17 ENCOUNTER — Ambulatory Visit (INDEPENDENT_AMBULATORY_CARE_PROVIDER_SITE_OTHER): Payer: Medicare Other | Admitting: Family Medicine

## 2019-07-17 VITALS — BP 136/80 | HR 65 | Temp 98.0°F | Ht 62.0 in | Wt 151.0 lb

## 2019-07-17 DIAGNOSIS — Z86010 Personal history of colonic polyps: Secondary | ICD-10-CM

## 2019-07-17 DIAGNOSIS — Z780 Asymptomatic menopausal state: Secondary | ICD-10-CM

## 2019-07-17 DIAGNOSIS — M10072 Idiopathic gout, left ankle and foot: Secondary | ICD-10-CM | POA: Diagnosis not present

## 2019-07-17 DIAGNOSIS — I10 Essential (primary) hypertension: Secondary | ICD-10-CM

## 2019-07-17 DIAGNOSIS — Z87891 Personal history of nicotine dependence: Secondary | ICD-10-CM | POA: Diagnosis not present

## 2019-07-17 DIAGNOSIS — E039 Hypothyroidism, unspecified: Secondary | ICD-10-CM | POA: Diagnosis not present

## 2019-07-17 DIAGNOSIS — E538 Deficiency of other specified B group vitamins: Secondary | ICD-10-CM | POA: Diagnosis not present

## 2019-07-17 DIAGNOSIS — Z23 Encounter for immunization: Secondary | ICD-10-CM | POA: Diagnosis not present

## 2019-07-17 DIAGNOSIS — Z90710 Acquired absence of both cervix and uterus: Secondary | ICD-10-CM

## 2019-07-17 DIAGNOSIS — Z0000 Encounter for general adult medical examination without abnormal findings: Secondary | ICD-10-CM

## 2019-07-17 DIAGNOSIS — F3342 Major depressive disorder, recurrent, in full remission: Secondary | ICD-10-CM

## 2019-07-17 DIAGNOSIS — E785 Hyperlipidemia, unspecified: Secondary | ICD-10-CM | POA: Diagnosis not present

## 2019-07-17 DIAGNOSIS — Z860101 Personal history of adenomatous and serrated colon polyps: Secondary | ICD-10-CM

## 2019-07-17 DIAGNOSIS — G2581 Restless legs syndrome: Secondary | ICD-10-CM

## 2019-07-17 LAB — POC URINALSYSI DIPSTICK (AUTOMATED)
Bilirubin, UA: NEGATIVE
Blood, UA: NEGATIVE
Glucose, UA: NEGATIVE
Ketones, UA: NEGATIVE
Leukocytes, UA: NEGATIVE
Nitrite, UA: NEGATIVE
Protein, UA: NEGATIVE
Spec Grav, UA: 1.015 (ref 1.010–1.025)
Urobilinogen, UA: 0.2 E.U./dL
pH, UA: 6.5 (ref 5.0–8.0)

## 2019-07-17 NOTE — Addendum Note (Signed)
Addended by: Jasper Loser on: 07/17/2019 03:20 PM   Modules accepted: Orders

## 2019-07-17 NOTE — Addendum Note (Signed)
Addended by: Francis Dowse T on: 07/17/2019 03:20 PM   Modules accepted: Orders

## 2019-07-18 LAB — CBC
HCT: 40.2 % (ref 36.0–46.0)
Hemoglobin: 13.3 g/dL (ref 12.0–15.0)
MCHC: 33 g/dL (ref 30.0–36.0)
MCV: 96.7 fl (ref 78.0–100.0)
Platelets: 199 10*3/uL (ref 150.0–400.0)
RBC: 4.16 Mil/uL (ref 3.87–5.11)
RDW: 14.7 % (ref 11.5–15.5)
WBC: 7.9 10*3/uL (ref 4.0–10.5)

## 2019-07-18 LAB — TSH: TSH: 1.71 u[IU]/mL (ref 0.35–4.50)

## 2019-07-18 LAB — VITAMIN B12: Vitamin B-12: 191 pg/mL — ABNORMAL LOW (ref 211–911)

## 2019-07-18 LAB — LDL CHOLESTEROL, DIRECT: Direct LDL: 170 mg/dL

## 2019-07-18 NOTE — Addendum Note (Signed)
Addended by: Francis Dowse T on: 07/18/2019 01:30 PM   Modules accepted: Orders

## 2019-07-19 ENCOUNTER — Other Ambulatory Visit (INDEPENDENT_AMBULATORY_CARE_PROVIDER_SITE_OTHER): Payer: Medicare Other

## 2019-07-19 ENCOUNTER — Other Ambulatory Visit: Payer: Self-pay

## 2019-07-19 DIAGNOSIS — E538 Deficiency of other specified B group vitamins: Secondary | ICD-10-CM

## 2019-07-19 DIAGNOSIS — Z0000 Encounter for general adult medical examination without abnormal findings: Secondary | ICD-10-CM

## 2019-07-19 LAB — COMPREHENSIVE METABOLIC PANEL
ALT: 10 U/L (ref 0–35)
AST: 13 U/L (ref 0–37)
Albumin: 4.2 g/dL (ref 3.5–5.2)
Alkaline Phosphatase: 81 U/L (ref 39–117)
BUN: 26 mg/dL — ABNORMAL HIGH (ref 6–23)
CO2: 24 mEq/L (ref 19–32)
Calcium: 9 mg/dL (ref 8.4–10.5)
Chloride: 109 mEq/L (ref 96–112)
Creatinine, Ser: 0.84 mg/dL (ref 0.40–1.20)
GFR: 67.92 mL/min (ref >60.00)
Glucose, Bld: 87 mg/dL (ref 70–99)
Potassium: 3.6 mEq/L (ref 3.5–5.1)
Sodium: 140 mEq/L (ref 135–145)
Total Bilirubin: 0.4 mg/dL (ref 0.2–1.2)
Total Protein: 6.1 g/dL (ref 6.0–8.3)

## 2019-07-19 MED ORDER — EZETIMIBE 10 MG PO TABS: 10.0000 mg | ORAL_TABLET | Freq: Every day | ORAL | 3 refills | Status: DC

## 2019-07-19 MED ORDER — CYANOCOBALAMIN 1000 MCG/ML IJ SOLN: INTRAMUSCULAR | 0 refills | Status: DC

## 2019-07-24 ENCOUNTER — Other Ambulatory Visit: Payer: Self-pay | Admitting: *Deleted

## 2019-07-24 DIAGNOSIS — Z87891 Personal history of nicotine dependence: Secondary | ICD-10-CM

## 2019-07-24 DIAGNOSIS — Z122 Encounter for screening for malignant neoplasm of respiratory organs: Secondary | ICD-10-CM

## 2019-07-30 ENCOUNTER — Telehealth: Payer: Self-pay | Admitting: Primary Care

## 2019-07-31 NOTE — Telephone Encounter (Signed)
Spoke with pt and rescheduled [facility] with Derl Barrow, NP 08/14/19 9:00. CT will be rescheduled Nothing further needed

## 2019-07-31 NOTE — Telephone Encounter (Signed)
LMTC x 1  

## 2019-08-02 ENCOUNTER — Ambulatory Visit: Payer: Medicare Other

## 2019-08-02 ENCOUNTER — Encounter: Payer: Medicare Other | Admitting: Primary Care

## 2019-08-14 ENCOUNTER — Encounter: Payer: Medicare Other | Admitting: Primary Care

## 2019-08-14 ENCOUNTER — Telehealth: Payer: Self-pay | Admitting: Primary Care

## 2019-08-14 ENCOUNTER — Ambulatory Visit: Payer: Medicare Other

## 2019-08-15 NOTE — Telephone Encounter (Signed)
Will await call back from pt to reschedule SDMV and Ct.  Will close this message and refer to referral notes.

## 2019-08-17 ENCOUNTER — Encounter: Payer: Self-pay | Admitting: Family Medicine

## 2019-08-29 ENCOUNTER — Encounter: Payer: Self-pay | Admitting: Internal Medicine

## 2019-09-11 ENCOUNTER — Telehealth: Payer: Self-pay | Admitting: Internal Medicine

## 2019-09-12 NOTE — Telephone Encounter (Signed)
Patient is returning [place] call. Patient phone number is [phone].

## 2019-09-12 NOTE — Telephone Encounter (Signed)
Spoke with pt and rescheduled [facility] 09/27/19 9:00 with [place] will be rescheduled Nothing further needed

## 2019-09-12 NOTE — Telephone Encounter (Signed)
LMTC x 1  

## 2019-09-17 ENCOUNTER — Telehealth: Payer: Self-pay | Admitting: Primary Care

## 2019-09-17 NOTE — Telephone Encounter (Signed)
Spoke with pt. She needs to cancel shared decision visit and CT for 12/3 and will call back to reschedule when she is available. Nothing further needed at this time.

## 2019-09-27 ENCOUNTER — Ambulatory Visit: Payer: Medicare Other

## 2019-09-27 ENCOUNTER — Encounter: Payer: Medicare Other | Admitting: Primary Care

## 2019-10-01 ENCOUNTER — Encounter: Payer: Medicare Other | Admitting: Internal Medicine

## 2019-10-04 ENCOUNTER — Other Ambulatory Visit: Payer: Self-pay

## 2019-10-04 ENCOUNTER — Encounter: Payer: Self-pay | Admitting: Internal Medicine

## 2019-10-04 ENCOUNTER — Ambulatory Visit (AMBULATORY_SURGERY_CENTER): Payer: Medicare Other

## 2019-10-04 VITALS — Temp 97.1°F | Ht 62.0 in | Wt 147.8 lb

## 2019-10-04 DIAGNOSIS — Z86010 Personal history of colonic polyps: Secondary | ICD-10-CM

## 2019-10-04 DIAGNOSIS — Z1159 Encounter for screening for other viral diseases: Secondary | ICD-10-CM

## 2019-10-04 MED ORDER — NA SULFATE-K SULFATE-MG SULF 17.5-3.13-1.6 GM/177ML PO SOLN: 1.0000 | Freq: Once | ORAL | 0 refills | Status: AC

## 2019-10-04 NOTE — Progress Notes (Signed)
Denies allergies to eggs or soy products. Denies complication of anesthesia or sedation. Denies use of weight loss medication. Denies use of O2.   [name] instructions given for colonoscopy.  Covid screening is scheduled for 10/11/19 @ 11:50 Am.

## 2019-10-11 ENCOUNTER — Ambulatory Visit (INDEPENDENT_AMBULATORY_CARE_PROVIDER_SITE_OTHER): Payer: Medicare Other

## 2019-10-11 ENCOUNTER — Other Ambulatory Visit: Payer: Self-pay | Admitting: Internal Medicine

## 2019-10-11 DIAGNOSIS — Z1159 Encounter for screening for other viral diseases: Secondary | ICD-10-CM

## 2019-10-11 LAB — SARS CORONAVIRUS 2 (TAT 6-24 HRS): SARS Coronavirus 2: NEGATIVE

## 2019-10-16 ENCOUNTER — Encounter: Payer: Self-pay | Admitting: Internal Medicine

## 2019-10-16 ENCOUNTER — Other Ambulatory Visit: Payer: Self-pay

## 2019-10-16 ENCOUNTER — Ambulatory Visit (AMBULATORY_SURGERY_CENTER): Payer: Medicare Other | Admitting: Internal Medicine

## 2019-10-16 VITALS — BP 134/62 | HR 67 | Temp 98.0°F | Resp 11 | Ht 62.0 in | Wt 147.8 lb

## 2019-10-16 DIAGNOSIS — Z86010 Personal history of colonic polyps: Secondary | ICD-10-CM | POA: Diagnosis not present

## 2019-10-16 DIAGNOSIS — D123 Benign neoplasm of transverse colon: Secondary | ICD-10-CM

## 2019-10-16 MED ORDER — SODIUM CHLORIDE 0.9 % IV SOLN: 500.0000 mL | Freq: Once | INTRAVENOUS | Status: DC

## 2019-10-16 NOTE — Patient Instructions (Signed)
Handouts given on polyps and Diverticulosis.   YOU HAD AN ENDOSCOPIC PROCEDURE TODAY AT [facility]:   Refer to the procedure report that was given to you for any specific questions about what was found during the examination.  If the procedure report does not answer your questions, please call your gastroenterologist to clarify.  If you requested that your care partner not be given the details of your procedure findings, then the procedure report has been included in a sealed envelope for you to review at your convenience later.  YOU SHOULD EXPECT: Some feelings of bloating in the abdomen. Passage of more gas than usual.  Walking can help get rid of the air that was put into your GI tract during the procedure and reduce the bloating. If you had a lower endoscopy (such as a colonoscopy or flexible sigmoidoscopy) you may notice spotting of blood in your stool or on the toilet paper. If you underwent a bowel prep for your procedure, you may not have a normal bowel movement for a few days.  Please Note:  You might notice some irritation and congestion in your nose or some drainage.  This is from the oxygen used during your procedure.  There is no need for concern and it should clear up in a day or so.  SYMPTOMS TO REPORT IMMEDIATELY:   Following lower endoscopy (colonoscopy or flexible sigmoidoscopy):  Excessive amounts of blood in the stool  Significant tenderness or worsening of abdominal pains  Swelling of the abdomen that is new, acute  Fever of 100F or higher   For urgent or emergent issues, a gastroenterologist can be reached at any hour by calling [phone].   DIET:  We do recommend a small meal at first, but then you may proceed to your regular diet.  Drink plenty of fluids but you should avoid alcoholic beverages for 24 hours.  ACTIVITY:  You should plan to take it easy for the rest of today and you should NOT DRIVE or use heavy machinery until tomorrow (because of  the sedation medicines used during the test).    FOLLOW UP: Our staff will call the number listed on your records 48-72 hours following your procedure to check on you and address any questions or concerns that you may have regarding the information given to you following your procedure. If we do not reach you, we will leave a message.  We will attempt to reach you two times.  During this call, we will ask if you have developed any symptoms of COVID 19. If you develop any symptoms (ie: fever, flu-like symptoms, shortness of breath, cough etc.) before then, please call [phone].  If you test positive for Covid 19 in the 2 weeks post procedure, please call and report this information to [place].    If any biopsies were taken you will be contacted by phone or by letter within the next 1-3 weeks.  Please call us at [phone] if you have not heard about the biopsies in 3 weeks.    SIGNATURES/CONFIDENTIALITY: You and/or your care partner have signed paperwork which will be entered into your electronic medical record.  These signatures attest to the fact that that the information above on your After Visit Summary has been reviewed and is understood.  Full responsibility of the confidentiality of this discharge information lies with you and/or your care-partner.

## 2019-10-16 NOTE — Progress Notes (Signed)
Report to PACU, RN, vss, BBS= Clear.  

## 2019-10-16 NOTE — Progress Notes (Signed)
Pt's states no medical or surgical changes since previsit or office visit. 

## 2019-10-16 NOTE — Op Note (Signed)
[place] Patient Name: Alison Brooks Procedure Date: 10/16/2019 10:23 AM MRN: [id number] Endoscopist: Docia Chuck. Henrene Pastor , MD Age: 63 Referring MD:  Date of Birth: 02-Jul-1956 Gender: Female Account #: [id number] Procedure:                Colonoscopy with cold hot snare piecemeal                            polypectomy, one lesion Indications:              High risk colon cancer surveillance: Personal                            history of adenoma (10 mm or greater in size), High                            risk colon cancer surveillance: Personal history of                            multiple (3 or more) adenomas. Previous                            colonoscopies 2007 (advanced adenoma), 2014,                            November 2019 (50 mm laterally spreading tumor in                            the region of the hepatic flexure), March 2020                            (residual adenoma at prior piecemeal polypectomy                            site). Now for follow-up. Family history of colon                            cancer in parent (33s) Medicines:                Monitored Anesthesia Care Procedure:                Pre-Anesthesia Assessment:                           - Prior to the procedure, a History and Physical                            was performed, and patient medications and                            allergies were reviewed. The patient's tolerance of                            previous anesthesia was also reviewed. The risks  and benefits of the procedure and the sedation                            options and risks were discussed with the patient.                            All questions were answered, and informed consent                            was obtained. Prior Anticoagulants: The patient has                            taken no previous anticoagulant or antiplatelet                            agents. ASA Grade Assessment: II - A  patient with                            mild systemic disease. After reviewing the risks                            and benefits, the patient was deemed in                            satisfactory condition to undergo the procedure.                           After obtaining informed consent, the colonoscope                            was passed under direct vision. Throughout the                            procedure, the patient's blood pressure, pulse, and                            oxygen saturations were monitored continuously. The                            Colonoscope was introduced through the anus and                            advanced to the the cecum, identified by                            appendiceal orifice and ileocecal valve. The                            ileocecal valve, appendiceal orifice, and rectum                            were photographed. The quality of the bowel  preparation was good. The colonoscopy was performed                            without difficulty. The patient tolerated the                            procedure well. The bowel preparation used was                            SUPREP via split dose instruction. Scope In: 10:30:49 AM Scope Out: 11:05:26 AM Scope Withdrawal Time: 0 hours 30 minutes 20 seconds  Total Procedure Duration: 0 hours 34 minutes 37 seconds  Findings:                 A 10 mm x 48mm polyp was found in the hepatic                            flexure. This was the region of prior EMR. Marking                            tattoo adjacent. The polyp was sessile. The polyp                            was removed with a hot snare. As well, observe                            residual tissue and edges were cauterized.                            Resection and retrieval were felt to be complete.                           Diverticula were found in the left colon.                           The exam was otherwise without  abnormality on                            direct and retroflexion views. Complications:            No immediate complications. Estimated blood loss:                            None. Estimated Blood Loss:     Estimated blood loss: none. Impression:               - One 10 mm polyp at the hepatic flexure, removed                            with a hot snare. Resected and retrieved. This at                            prior EMR site                           -  Diverticulosis in the left colon.                           - The examination was otherwise normal on direct                            and retroflexion views. Recommendation:           - Repeat colonoscopy in 6 months for surveillance                            (PEDS scope, retroflexion).                           - Patient has a contact number available for                            emergencies. The signs and symptoms of potential                            delayed complications were discussed with the                            patient. Return to normal activities tomorrow.                            Written discharge instructions were provided to the                            patient.                           - Resume previous diet.                           - Continue present medications.                           - Await pathology results. Docia Chuck. Henrene Pastor, MD 10/16/2019 11:13:51 AM This report has been signed electronically.

## 2019-10-16 NOTE — Progress Notes (Signed)
Temperature taken by J.B., VS taken by D.T. 

## 2019-10-17 ENCOUNTER — Other Ambulatory Visit: Payer: Self-pay

## 2019-10-18 ENCOUNTER — Telehealth: Payer: Self-pay | Admitting: *Deleted

## 2019-10-18 NOTE — Telephone Encounter (Signed)
  Follow up Call-  Call back number 10/16/2019 01/09/2019 08/29/2018  Post procedure Call Back phone  # [phone]  Permission to leave phone message Yes Yes Yes  Some recent data might be hidden     Patient questions:  Do you have a fever, pain , or abdominal swelling? No. Pain Score  0 *  Have you tolerated food without any problems? Yes.    Have you been able to return to your normal activities? Yes.    Do you have any questions about your discharge instructions: Diet   No. Medications  No. Follow up visit  No.  Do you have questions or concerns about your Care? No.  Actions: * If pain score is 4 or above: 1. No action needed, pain <4.Have you developed a fever since your procedure? no  2.   Have you had an respiratory symptoms (SOB or cough) since your procedure? no  3.   Have you tested positive for COVID 19 since your procedure no  4.   Have you had any family members/close contacts diagnosed with the COVID 19 since your procedure?  no   If yes to any of these questions please route to Joylene John, RN and Alphonsa Gin, Therapist, sports.

## 2019-10-23 ENCOUNTER — Other Ambulatory Visit: Payer: Self-pay | Admitting: Family Medicine

## 2019-10-26 DIAGNOSIS — J449 Chronic obstructive pulmonary disease, unspecified: Secondary | ICD-10-CM

## 2019-10-26 HISTORY — DX: Chronic obstructive pulmonary disease, unspecified: J44.9

## 2019-10-29 ENCOUNTER — Encounter: Payer: Self-pay | Admitting: Internal Medicine

## 2019-11-20 ENCOUNTER — Ambulatory Visit (INDEPENDENT_AMBULATORY_CARE_PROVIDER_SITE_OTHER): Payer: Medicare Other | Admitting: Primary Care

## 2019-11-20 ENCOUNTER — Other Ambulatory Visit: Payer: Self-pay

## 2019-11-20 ENCOUNTER — Encounter: Payer: Self-pay | Admitting: Primary Care

## 2019-11-20 ENCOUNTER — Ambulatory Visit
Admission: RE | Admit: 2019-11-20 | Discharge: 2019-11-20 | Disposition: A | Discharge status: Home / Self Care | Payer: Medicare Other | Source: Ambulatory Visit | Attending: Acute Care | Admitting: Acute Care

## 2019-11-20 VITALS — BP 128/70 | HR 53 | Temp 97.2°F | Ht 62.0 in | Wt 148.4 lb

## 2019-11-20 DIAGNOSIS — Z122 Encounter for screening for malignant neoplasm of respiratory organs: Secondary | ICD-10-CM

## 2019-11-20 DIAGNOSIS — Z87891 Personal history of nicotine dependence: Secondary | ICD-10-CM | POA: Diagnosis not present

## 2019-11-20 NOTE — Progress Notes (Signed)
Shared Decision Making Visit Lung Cancer Screening Program [phone])   Eligibility:  Age 64 y.o.  Pack Years Smoking History Calculation 81 (# packs/per year x # years smoked)  Recent History of coughing up blood  no  Unexplained weight loss? no ( >Than 15 pounds within the last 6 months )  Prior History Lung / other cancer no (Diagnosis within the last 5 years already requiring surveillance chest CT Scans).  Smoking Status Former Smoker  Former Smokers: Years since quit: 12 years  Quit Date: 2009  Visit Components:  Discussion included one or more decision making aids. yes  Discussion included risk/benefits of screening. yes  Discussion included potential follow up diagnostic testing for abnormal scans. yes  Discussion included meaning and risk of over diagnosis. yes  Discussion included meaning and risk of False Positives. yes  Discussion included meaning of total radiation exposure. yes  Counseling Included:  Importance of adherence to annual lung cancer LDCT screening. yes  Impact of comorbidities on ability to participate in the program. yes  Ability and willingness to under diagnostic treatment. yes  Smoking Cessation Counseling:  Current Smokers:   Discussed importance of smoking cessation. yes  Information about tobacco cessation classes and interventions provided to patient. yes  Patient provided with "ticket" for LDCT Scan. yes  Symptomatic Patient. no  Counseling(Intermediate counseling: > three minutes) [id number]  Diagnosis Code: Tobacco Use Z72.0  Asymptomatic Patient yes  Counseling (Intermediate counseling: > three minutes counseling) [id number]  Former Smokers:   Discussed the importance of maintaining cigarette abstinence. yes  Diagnosis Code: Personal History of Nicotine Dependence. [id number]  Information about tobacco cessation classes and interventions provided to patient. Yes  Patient provided with "ticket" for LDCT Scan. yes  Written  Order for Lung Cancer Screening with LDCT placed in Epic. Yes (CT Chest Lung Cancer Screening Low Dose W/O CM) [id number] Z12.2-Screening of respiratory organs Z87.891-Personal history of nicotine dependence  BP 128/70 (BP Location: Right Arm, Patient Position: Sitting, Cuff Size: Normal)   Pulse (!) 53   Temp (!) 97.2 F (36.2 C)   Ht 5\' 2"  (1.575 m)   Wt 148 lb 6.4 oz (67.3 kg)   SpO2 99% Comment: on room air  BMI 27.14 kg/m    I spent 25 minutes of face to face time with Alison Brooks discussing the risks and benefits of lung cancer screening. We viewed a power point together that explained in detail the above noted topics. We took the time to pause the power point at intervals to allow for questions to be asked and answered to ensure understanding. We discussed that she had taken the single most powerful action possible to decrease her risk of developing lung cancer when she quit smoking. I counseled her to remain smoke free, and to contact me if she ever had the desire to smoke again so that I can provide resources and tools to help support the effort to remain smoke free. We discussed the time and location of the scan, and that either  Doroteo Glassman RN or I will call with the results within  24-48 hours of receiving them. She has my card and contact information in the event she needs to speak with me, in addition to a copy of the power point we reviewed as a resource. She  verbalized understanding of all of the above and had no further questions upon leaving the office.   I explained to the patient that there has been a high incidence of coronary artery  disease noted on these exams. I explained that this is a non-gated exam therefore degree or severity cannot be determined. This patient is currently on statin therapy. I have asked the patient to follow-up with their PCP regarding any incidental finding of coronary artery disease and management with diet or medication as they feel is clinically  indicated. The patient verbalized understanding of the above and had no further questions.    Alison Ehrich, NP

## 2019-11-20 NOTE — Patient Instructions (Signed)
Thank you for participating in the [facility]. It was our pleasure to meet you today. We will call you with the results of your scan within the next few days. Your scan will be assigned a Lung RADS category score by the physicians reading the scans.  This Lung RADS score determines follow up scanning.  See below for description of categories, and follow up screening recommendations. We will be in touch to schedule your follow up screening annually or based on recommendations of our providers. We will fax a copy of your scan results to your Primary Care Physician, or the physician who referred you to the program, to ensure they have the results. Please call the office if you have any questions or concerns regarding your scanning experience or results.  Our office number is [phone]. Please speak with Denise Phelps, RN. She is our Lung Cancer Screening RN. If she is unavailable when you call, please have the office staff send her a message. She will return your call at her earliest convenience. Remember, if your scan is normal, we will scan you annually as long as you continue to meet the criteria for the program. (Age 55-77, Current smoker or smoker who has quit within the last 15 years). If you are a smoker, remember, quitting is the single most powerful action that you can take to decrease your risk of lung cancer and other pulmonary, breathing related problems. We know quitting is hard, and we are here to help.  Please let us know if there is anything we can do to help you meet your goal of quitting. If you are a former smoker, congratulations. We are proud of you! Remain smoke free! Remember you can refer friends or family members through the number above.  We will screen them to make sure they meet criteria for the program. Thank you for helping us take better care of you by participating in Lung Screening.  Lung RADS Categories:  Lung RADS 1: no nodules  or definitely non-concerning nodules.  Recommendation is for a repeat annual scan in 12 months.  Lung RADS 2:  nodules that are non-concerning in appearance and behavior with a very low likelihood of becoming an active cancer. Recommendation is for a repeat annual scan in 12 months.  Lung RADS 3: nodules that are probably non-concerning , includes nodules with a low likelihood of becoming an active cancer.  Recommendation is for a 6-month repeat screening scan. Often noted after an upper respiratory illness. We will be in touch to make sure you have no questions, and to schedule your 6-month scan.  Lung RADS 4 A: nodules with concerning findings, recommendation is most often for a follow up scan in 3 months or additional testing based on our provider's assessment of the scan. We will be in touch to make sure you have no questions and to schedule the recommended 3 month follow up scan.  Lung RADS 4 B:  indicates findings that are concerning. We will be in touch with you to schedule additional diagnostic testing based on our provider's  assessment of the scan.   

## 2019-11-21 NOTE — Progress Notes (Signed)
Please call patient and let them  know their  low dose Ct was read as a Lung RADS 2: nodules that are benign in appearance and behavior with a very low likelihood of becoming a clinically active cancer due to size or lack of growth. Recommendation per radiology is for a repeat LDCT in 12 months. .Please let them  know we will order and schedule their  annual screening scan for 10/2020. Please let them  know there was notation of CAD on their  scan.  Please remind the patient  that this is a non-gated exam therefore degree or severity of disease  cannot be determined. Please have them  follow up with their PCP regarding potential risk factor modification, dietary therapy or pharmacologic therapy if clinically indicated. Pt.  is  currently on statin therapy. Please place order for annual  screening scan for  10/2020 and fax results to PCP. Thanks so much. 

## 2019-11-22 ENCOUNTER — Other Ambulatory Visit: Payer: Self-pay | Admitting: *Deleted

## 2019-11-22 ENCOUNTER — Telehealth: Payer: Self-pay | Admitting: Family Medicine

## 2019-11-22 DIAGNOSIS — Z87891 Personal history of nicotine dependence: Secondary | ICD-10-CM

## 2019-11-22 NOTE — Telephone Encounter (Signed)
I called the patient to schedule AWV-I with Loma Sousa, but she wasn't available.

## 2019-11-27 ENCOUNTER — Telehealth: Payer: Self-pay | Admitting: Family Medicine

## 2019-11-27 NOTE — Telephone Encounter (Signed)
Called pt. No answer/No VM. Pt due to schedule Medicare Annual Wellness Visit (AWV) either virtually/audio only OR in office. Whatever the patients preference is.  No hx; please schedule at anytime with [facility]Nurse Health Advisor at [facility]

## 2019-12-24 ENCOUNTER — Other Ambulatory Visit: Payer: Self-pay | Admitting: Family Medicine

## 2019-12-24 ENCOUNTER — Other Ambulatory Visit: Payer: Self-pay | Admitting: Internal Medicine

## 2019-12-24 DIAGNOSIS — K219 Gastro-esophageal reflux disease without esophagitis: Secondary | ICD-10-CM

## 2020-01-15 ENCOUNTER — Ambulatory Visit: Payer: Medicare Other | Admitting: Family Medicine

## 2020-02-12 ENCOUNTER — Telehealth: Payer: Self-pay | Admitting: Family Medicine

## 2020-02-12 NOTE — Telephone Encounter (Signed)
I called the patient to schedule AWV-I with Loma Sousa.  She said that she will call back when she has her calendar.

## 2020-03-18 ENCOUNTER — Other Ambulatory Visit: Payer: Self-pay | Admitting: Family Medicine

## 2020-03-18 ENCOUNTER — Other Ambulatory Visit: Payer: Self-pay | Admitting: Internal Medicine

## 2020-03-18 DIAGNOSIS — K219 Gastro-esophageal reflux disease without esophagitis: Secondary | ICD-10-CM

## 2020-04-24 ENCOUNTER — Other Ambulatory Visit: Payer: Self-pay | Admitting: Internal Medicine

## 2020-04-24 ENCOUNTER — Other Ambulatory Visit: Payer: Self-pay | Admitting: Family Medicine

## 2020-04-24 DIAGNOSIS — K219 Gastro-esophageal reflux disease without esophagitis: Secondary | ICD-10-CM

## 2020-05-05 DIAGNOSIS — E039 Hypothyroidism, unspecified: Secondary | ICD-10-CM | POA: Diagnosis not present

## 2020-05-09 DIAGNOSIS — E039 Hypothyroidism, unspecified: Secondary | ICD-10-CM | POA: Diagnosis not present

## 2020-05-14 DIAGNOSIS — C44319 Basal cell carcinoma of skin of other parts of face: Secondary | ICD-10-CM | POA: Diagnosis not present

## 2020-05-14 DIAGNOSIS — L820 Inflamed seborrheic keratosis: Secondary | ICD-10-CM | POA: Diagnosis not present

## 2020-05-23 ENCOUNTER — Other Ambulatory Visit: Payer: Self-pay

## 2020-05-23 ENCOUNTER — Ambulatory Visit (INDEPENDENT_AMBULATORY_CARE_PROVIDER_SITE_OTHER): Payer: Medicare Other | Admitting: Family Medicine

## 2020-05-23 ENCOUNTER — Encounter: Payer: Self-pay | Admitting: Family Medicine

## 2020-05-23 VITALS — BP 124/66 | HR 61 | Temp 98.6°F | Ht 62.0 in | Wt 148.0 lb

## 2020-05-23 DIAGNOSIS — R109 Unspecified abdominal pain: Secondary | ICD-10-CM | POA: Diagnosis not present

## 2020-05-23 DIAGNOSIS — N3001 Acute cystitis with hematuria: Secondary | ICD-10-CM | POA: Diagnosis not present

## 2020-05-23 DIAGNOSIS — R300 Dysuria: Secondary | ICD-10-CM

## 2020-05-23 LAB — POC URINALSYSI DIPSTICK (AUTOMATED)
Blood, UA: POSITIVE
Glucose, UA: NEGATIVE
Ketones, UA: NEGATIVE
Leukocytes, UA: NEGATIVE
Nitrite, UA: NEGATIVE
Protein, UA: POSITIVE — AB
Spec Grav, UA: 1.02 (ref 1.010–1.025)
Urobilinogen, UA: 1 E.U./dL
pH, UA: 6.5 (ref 5.0–8.0)

## 2020-05-23 MED ORDER — CEPHALEXIN 500 MG PO CAPS
500.0000 mg | ORAL_CAPSULE | Freq: Four times a day (QID) | ORAL | 0 refills | Status: AC
Start: 2020-05-23 — End: 2020-05-30

## 2020-05-23 MED ORDER — CEFTRIAXONE SODIUM 1 G IJ SOLR
1.0000 g | Freq: Once | INTRAMUSCULAR | Status: AC
  Administered 2020-05-23: 1 g via INTRAMUSCULAR

## 2020-05-23 NOTE — Progress Notes (Signed)
Phone [phone] In person visit   Subjective:   Alison Brooks is a 64 y.o. year old very pleasant female patient who presents for/with See problem oriented charting Chief Complaint  Patient presents with  . Polyuria    x 3 weeks.   . Dysuria   This visit occurred during the SARS-CoV-2 public health emergency.  Safety protocols were in place, including screening questions prior to the visit, additional usage of staff PPE, and extensive cleaning of exam room while observing appropriate contact time as indicated for disinfecting solutions.   Past Medical History-  Patient Active Problem List   Diagnosis Date Noted  . 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 Gout 06/28/2014    Priority: Medium  . Vitamin B12 deficiency 06/28/2014    Priority: Medium  . Multiple pulmonary nodules 06/28/2014    Priority: Medium  . Essential hypertension 05/17/2014    Priority: Medium  . RESTLESS LEG SYNDROME, SEVERE 03/14/2009    Priority: Medium  . Chronic interstitial cystitis 01/20/2009    Priority: Medium  . Depression, major, recurrent ([place]) 03/19/2008    Priority: Medium  . Hyperlipidemia 02/17/2008    Priority: Medium  . Former smoker 12/21/2007    Priority: Medium  . FIBROMYALGIA 04/21/2007    Priority: Medium  . Obesity (BMI 30-39.9) 01/14/2014    Priority: Low  . Lateral epicondylitis (tennis elbow) 01/02/2014    Priority: Low  . GERD (gastroesophageal reflux disease) 03/26/2013    Priority: Low  . LEG CRAMPS 05/16/2009    Priority: Low  . TUBULOVILLOUS ADENOMA, COLON 02/17/2008    Priority: Low  . NEPHROLITHIASIS 02/17/2008    Priority: Low  . MIGRAINE, CLASSICAL W/O INTRACTABLE MIGRAINE 06/09/2007    Priority: Low  . SYMPTOM, MEMORY LOSS 06/09/2007    Priority: Low  . History of total hysterectomy 07/17/2019  . Hypothyroidism 07/17/2019  . Vertigo 11/06/2016  . Dyspnea 11/02/2014  . Upper airway cough  syndrome 11/02/2014    Medications- reviewed and updated Current Outpatient Medications  Medication Sig Dispense Refill  . allopurinol (ZYLOPRIM) 100 MG tablet TAKE 1 TABLET BY MOUTH  DAILY 90 tablet 3  . amLODipine (NORVASC) 2.5 MG tablet Take 1 tablet (2.5 mg total) by mouth daily. 90 tablet 3  . baclofen (LIORESAL) 10 MG tablet TAKE 1/2 TO 1 TABLET BY  MOUTH 3 TIMES DAILY 180 tablet 0  . cyanocobalamin (,VITAMIN B-12,) 1000 MCG/ML injection 1000 mcg (1 mg) injection once per per month or as directed 30 mL 0  . diclofenac sodium (VOLTAREN) 1 % GEL Apply 4 g topically 4 (four) times daily. (Patient taking differently: Apply 4 g topically as needed. ) 100 g 1  . escitalopram (LEXAPRO) 20 MG tablet TAKE 1 TABLET BY MOUTH  DAILY 90 tablet 3  . ezetimibe (ZETIA) 10 MG tablet Take 1 tablet (10 mg total) by mouth daily. 90 tablet 3  . famotidine (PEPCID) 20 MG tablet Take 1 tablet (20 mg total) by mouth at bedtime. 30 tablet 6  . levothyroxine (SYNTHROID, LEVOTHROID) 50 MCG tablet     . oxybutynin (DITROPAN) 5 MG tablet TAKE 1 TABLET BY MOUTH 2  TIMES DAILY AS NEEDED FOR  BLADDER SPASMS. 180 tablet 3  . pantoprazole (PROTONIX) 40 MG tablet TAKE 1 TABLET BY MOUTH  TWICE DAILY 30 TO 60  MINUTES BEFORE MEALS 180 tablet 1  . rosuvastatin (CRESTOR) 40 MG tablet TAKE 1 TABLET BY  MOUTH  DAILY 90 tablet 3  . topiramate (TOPAMAX) 200 MG tablet TAKE 1 TABLET BY MOUTH  DAILY 90 tablet 3   No current facility-administered medications for this visit.     Objective:  BP 124/66   Pulse 61   Temp 98.6 F (37 C) (Temporal)   Ht 5' 2"  (1.575 m)   Wt 148 lb (67.1 kg)   SpO2 98%   BMI 27.07 kg/m  Gen: NAD, resting comfortably CV: RRR no murmurs rubs or gallops Lungs: CTAB no crackles, wheeze, rhonchi Abdomen: soft/nontender except for mild pain in lower abdomen/nondistended/normal bowel sounds. No rebound or guarding.  Ext: no edema Skin: warm, dry  No results found for this or any previous visit  (from the past 24 hour(s)).    Assessment and Plan   #Concern for UTI S: Patients symptoms started increased over the last 2-3 weeks.  Complains of dysuria: yes ; polyuria: yes ; nocturia: yes ; urgency: yes .  Symptoms are worsening. nausea and vomiting today only. Some flank pain on right side Has tried pyridium and oxybutynin.   ROS- no fever, chills, No blood in urine in general- thinks perhaps saw slight twinge once  A/P: UA with possible  UTI. Will get culture. Empiric treatment with: keflex. Given she has had some flank pain and fatigue- I am going to give a single dose of ceftriaxone then can start keflex tomorrow. Hope this is not an early pyelonephritis and she knows to follow up emergently if symptoms worsen. She also is slightly concerned about a kidney stone- gave indications for follow up with urology Dr. Karsten Ro that she sees -with possible blood will plan on UA at next visit.  -with possible systemic symptoms will get CBC, cmp as well Patient to follow up if new or worsening symptoms or failure to improve.     Recommended follow up: Return for as needed for new, worsening, persistent symptoms.   Lab/Order associations:   ICD-10-CM   1. Acute cystitis with hematuria  N30.01   2. Dysuria  R30.0 POCT Urinalysis Dipstick (Automated)    Urine Culture    cefTRIAXone (ROCEPHIN) injection 1 g    CBC with Differential/Platelet    Urine Culture    CBC with Differential/Platelet    Comp Met (CMET)    CANCELED: CBC with Differential/Platelet    CANCELED: Comprehensive metabolic panel    CANCELED: Comprehensive metabolic panel  3. Right flank pain  R10.9 Urine Culture    cefTRIAXone (ROCEPHIN) injection 1 g    CBC with Differential/Platelet    Urine Culture    CBC with Differential/Platelet    Comp Met (CMET)    CANCELED: CBC with Differential/Platelet    CANCELED: Comprehensive metabolic panel    CANCELED: Comprehensive metabolic panel    Meds ordered this encounter    Medications  . cephALEXin (KEFLEX) 500 MG capsule    Sig: Take 1 capsule (500 mg total) by mouth 4 (four) times daily for 7 days.    Dispense:  28 capsule    Refill:  0  . cefTRIAXone (ROCEPHIN) injection 1 g    Return precautions advised.  Garret Reddish, MD

## 2020-05-23 NOTE — Patient Instructions (Addendum)
Health Maintenance Due  Topic Date Due  . MAMMOGRAM will make app  07/27/2018  . DEXA SCAN  Will make app  Never done   Please stop by lab before you go If you have mychart- we will send your results within 3 business days of [place] receiving them.  If you do not have mychart- we will call you about results within 5 business days of [place] receiving them.   Ceftriaxone 1 g intramuscular today  Starting tomorrow morning start Keflex 4 times a day for 7 days  If you have worsening symptoms seek care in emergency room-there is a possibility you have a kidney infection with a slight chills and nausea and vomiting you had a day.  The ceftriaxone typically covers a kidney infection or the Keflex is more for urinary tract infection though as noted he may need to seek further care over the weekend. Could also reach out to Dr. Karsten Ro if needed for advice if more concerned about kidney stones  Recommended follow up: Return for as needed for new, worsening, persistent symptoms.  Schedule visit in 2-3 months and we will need to recheck urine.

## 2020-05-24 LAB — CBC WITH DIFFERENTIAL/PLATELET
Absolute Monocytes: 569 cells/uL (ref 200–950)
Basophils Absolute: 72 cells/uL (ref 0–200)
Basophils Relative: 1 %
Eosinophils Absolute: 86 cells/uL (ref 15–500)
Eosinophils Relative: 1.2 %
HCT: 40.1 % (ref 35.0–45.0)
Hemoglobin: 13.3 g/dL (ref 11.7–15.5)
Lymphs Abs: 2866 cells/uL (ref 850–3900)
MCH: 31.6 pg (ref 27.0–33.0)
MCHC: 33.2 g/dL (ref 32.0–36.0)
MCV: 95.2 fL (ref 80.0–100.0)
MPV: 9.7 fL (ref 7.5–12.5)
Monocytes Relative: 7.9 %
Neutro Abs: 3607 cells/uL (ref 1500–7800)
Neutrophils Relative %: 50.1 %
Platelets: 197 10*3/uL (ref 140–400)
RBC: 4.21 10*6/uL (ref 3.80–5.10)
RDW: 13.5 % (ref 11.0–15.0)
Total Lymphocyte: 39.8 %
WBC: 7.2 10*3/uL (ref 3.8–10.8)

## 2020-05-24 LAB — COMPREHENSIVE METABOLIC PANEL
AG Ratio: 2.1 (calc) (ref 1.0–2.5)
ALT: 14 U/L (ref 6–29)
AST: 15 U/L (ref 10–35)
Albumin: 4.2 g/dL (ref 3.6–5.1)
Alkaline phosphatase (APISO): 74 U/L (ref 37–153)
BUN: 18 mg/dL (ref 7–25)
CO2: 23 mmol/L (ref 20–32)
Calcium: 9 mg/dL (ref 8.6–10.4)
Chloride: 110 mmol/L (ref 98–110)
Creat: 0.77 mg/dL (ref 0.50–0.99)
Globulin: 2 g/dL (calc) (ref 1.9–3.7)
Glucose, Bld: 87 mg/dL (ref 65–99)
Potassium: 4 mmol/L (ref 3.5–5.3)
Sodium: 139 mmol/L (ref 135–146)
Total Bilirubin: 0.6 mg/dL (ref 0.2–1.2)
Total Protein: 6.2 g/dL (ref 6.1–8.1)

## 2020-05-24 LAB — URINE CULTURE
MICRO NUMBER:: 10770286
SPECIMEN QUALITY:: ADEQUATE

## 2020-06-05 ENCOUNTER — Ambulatory Visit (INDEPENDENT_AMBULATORY_CARE_PROVIDER_SITE_OTHER): Payer: Medicare Other

## 2020-06-05 DIAGNOSIS — Z23 Encounter for immunization: Secondary | ICD-10-CM

## 2020-06-05 DIAGNOSIS — Z0000 Encounter for general adult medical examination without abnormal findings: Secondary | ICD-10-CM

## 2020-06-05 DIAGNOSIS — Z1231 Encounter for screening mammogram for malignant neoplasm of breast: Secondary | ICD-10-CM

## 2020-06-05 DIAGNOSIS — M810 Age-related osteoporosis without current pathological fracture: Secondary | ICD-10-CM

## 2020-06-05 NOTE — Patient Instructions (Signed)
Alison Brooks , Thank you for taking time to come for your Medicare Wellness Visit. I appreciate your ongoing commitment to your health goals. Please review the following plan we discussed and let me know if I can assist you in the future.   Screening recommendations/referrals: Colonoscopy: Done 10/16/19 Mammogram: Done 07/27/17 Due and discussed Bone Density: Ordered on 07/17/19 Due  Recommended yearly ophthalmology/optometry visit for glaucoma screening and checkup Recommended yearly dental visit for hygiene and checkup  Vaccinations: Influenza vaccine: Up to date Pneumococcal vaccine:Due information discussed Tdap vaccine: Up to date Shingles vaccine: Shingrix discussed. Please contact your pharmacy for coverage information.    Covid-19:Completed 1/23 & 12/17/19  Advanced directives: Advance directive discussed with you today. I have provided a copy for you to complete at home and have notarized. Once this is complete please bring a copy in to our office so we can scan it into your chart.   Conditions/risks identified: Lose weight  Next appointment: Follow up in one year for your annual wellness visit    Preventive Care 65 Years and Older, Female Preventive care refers to lifestyle choices and visits with your health care provider that can promote health and wellness. What does preventive care include?  A yearly physical exam. This is also called an annual well check.  Dental exams once or twice a year.  Routine eye exams. Ask your health care provider how often you should have your eyes checked.  Personal lifestyle choices, including:  Daily care of your teeth and gums.  Regular physical activity.  Eating a healthy diet.  Avoiding tobacco and drug use.  Limiting alcohol use.  Practicing safe sex.  Taking low-dose aspirin every day.  Taking vitamin and mineral supplements as recommended by your health care provider. What happens during an annual well check? The  services and screenings done by your health care provider during your annual well check will depend on your age, overall health, lifestyle risk factors, and family history of disease. Counseling  Your health care provider may ask you questions about your:  Alcohol use.  Tobacco use.  Drug use.  Emotional well-being.  Home and relationship well-being.  Sexual activity.  Eating habits.  History of falls.  Memory and ability to understand (cognition).  Work and work Statistician.  Reproductive health. Screening  You may have the following tests or measurements:  Height, weight, and BMI.  Blood pressure.  Lipid and cholesterol levels. These may be checked every 5 years, or more frequently if you are over 17 years old.  Skin check.  Lung cancer screening. You may have this screening every year starting at age 35 if you have a 30-pack-year history of smoking and currently smoke or have quit within the past 15 years.  Fecal occult blood test (FOBT) of the stool. You may have this test every year starting at age 50.  Flexible sigmoidoscopy or colonoscopy. You may have a sigmoidoscopy every 5 years or a colonoscopy every 10 years starting at age 52.  Hepatitis C blood test.  Hepatitis B blood test.  Sexually transmitted disease (STD) testing.  Diabetes screening. This is done by checking your blood sugar (glucose) after you have not eaten for a while (fasting). You may have this done every 1-3 years.  Bone density scan. This is done to screen for osteoporosis. You may have this done starting at age 73.  Mammogram. This may be done every 1-2 years. Talk to your health care provider about how often you should  have regular mammograms. Talk with your health care provider about your test results, treatment options, and if necessary, the need for more tests. Vaccines  Your health care provider may recommend certain vaccines, such as:  Influenza vaccine. This is recommended  every year.  Tetanus, diphtheria, and acellular pertussis (Tdap, Td) vaccine. You may need a Td booster every 10 years.  Zoster vaccine. You may need this after age 68.  Pneumococcal 13-valent conjugate (PCV13) vaccine. One dose is recommended after age 43.  Pneumococcal polysaccharide (PPSV23) vaccine. One dose is recommended after age 41. Talk to your health care provider about which screenings and vaccines you need and how often you need them. This information is not intended to replace advice given to you by your health care provider. Make sure you discuss any questions you have with your health care provider. Document Released: 11/07/2015 Document Revised: 06/30/2016 Document Reviewed: 08/12/2015 Elsevier Interactive Patient Education  2017 [place] Prevention in the Home Falls can cause injuries. They can happen to people of all ages. There are many things you can do to make your home safe and to help prevent falls. What can I do on the outside of my home?  Regularly fix the edges of walkways and driveways and fix any cracks.  Remove anything that might make you trip as you walk through a door, such as a raised step or threshold.  Trim any bushes or trees on the path to your home.  Use bright outdoor lighting.  Clear any walking paths of anything that might make someone trip, such as rocks or tools.  Regularly check to see if handrails are loose or broken. Make sure that both sides of any steps have handrails.  Any raised decks and porches should have guardrails on the edges.  Have any leaves, snow, or ice cleared regularly.  Use sand or salt on walking paths during winter.  Clean up any spills in your garage right away. This includes oil or grease spills. What can I do in the bathroom?  Use night lights.  Install grab bars by the toilet and in the tub and shower. Do not use towel bars as grab bars.  Use non-skid mats or decals in the tub or shower.  If  you need to sit down in the shower, use a plastic, non-slip stool.  Keep the floor dry. Clean up any water that spills on the floor as soon as it happens.  Remove soap buildup in the tub or shower regularly.  Attach bath mats securely with double-sided non-slip rug tape.  Do not have throw rugs and other things on the floor that can make you trip. What can I do in the bedroom?  Use night lights.  Make sure that you have a light by your bed that is easy to reach.  Do not use any sheets or blankets that are too big for your bed. They should not hang down onto the floor.  Have a firm chair that has side arms. You can use this for support while you get dressed.  Do not have throw rugs and other things on the floor that can make you trip. What can I do in the kitchen?  Clean up any spills right away.  Avoid walking on wet floors.  Keep items that you use a lot in easy-to-reach places.  If you need to reach something above you, use a strong step stool that has a grab bar.  Keep electrical cords out of  the way.  Do not use floor polish or wax that makes floors slippery. If you must use wax, use non-skid floor wax.  Do not have throw rugs and other things on the floor that can make you trip. What can I do with my stairs?  Do not leave any items on the stairs.  Make sure that there are handrails on both sides of the stairs and use them. Fix handrails that are broken or loose. Make sure that handrails are as long as the stairways.  Check any carpeting to make sure that it is firmly attached to the stairs. Fix any carpet that is loose or worn.  Avoid having throw rugs at the top or bottom of the stairs. If you do have throw rugs, attach them to the floor with carpet tape.  Make sure that you have a light switch at the top of the stairs and the bottom of the stairs. If you do not have them, ask someone to add them for you. What else can I do to help prevent falls?  Wear shoes  that:  Do not have high heels.  Have rubber bottoms.  Are comfortable and fit you well.  Are closed at the toe. Do not wear sandals.  If you use a stepladder:  Make sure that it is fully opened. Do not climb a closed stepladder.  Make sure that both sides of the stepladder are locked into place.  Ask someone to hold it for you, if possible.  Clearly mark and make sure that you can see:  Any grab bars or handrails.  First and last steps.  Where the edge of each step is.  Use tools that help you move around (mobility aids) if they are needed. These include:  Canes.  Walkers.  Scooters.  Crutches.  Turn on the lights when you go into a dark area. Replace any light bulbs as soon as they burn out.  Set up your furniture so you have a clear path. Avoid moving your furniture around.  If any of your floors are uneven, fix them.  If there are any pets around you, be aware of where they are.  Review your medicines with your doctor. Some medicines can make you feel dizzy. This can increase your chance of falling. Ask your doctor what other things that you can do to help prevent falls. This information is not intended to replace advice given to you by your health care provider. Make sure you discuss any questions you have with your health care provider. Document Released: 08/07/2009 Document Revised: 03/18/2016 Document Reviewed: 11/15/2014 Elsevier Interactive Patient Education  2017 [employer].

## 2020-06-05 NOTE — Progress Notes (Addendum)
Virtual Visit via Telephone Note  I connected with  Alison Brooks on 06/05/20 at 10:15 AM EDT by telephone and verified that I am speaking with the correct person using two identifiers.  Medicare Annual Wellness visit completed telephonically due to Covid-19 pandemic.   Persons participating in this call: This Health Coach and this patient.   Location: Patient: Home Provider: Office   I discussed the limitations, risks, security and privacy concerns of performing an evaluation and management service by telephone and the availability of in person appointments. The patient expressed understanding and agreed to proceed.  Unable to perform video visit due to video visit attempted and failed and/or patient does not have video capability.   Some vital signs may be absent or patient reported.   Willette Brace, LPN    Subjective:   Alison Brooks is a 64 y.o. female who presents for an Initial Medicare Annual Wellness Visit.  Review of Systems     Cardiac Risk Factors include: hypertension;dyslipidemia     Objective:    There were no vitals filed for this visit. There is no height or weight on file to calculate BMI.  Advanced Directives 06/05/2020 07/28/2016 04/02/2016 02/29/2016 02/03/2016  Does Patient Have a Medical Advance Directive? No No No No No  Would patient like information on creating a medical advance directive? Yes (MAU/Ambulatory/Procedural Areas - Information given) - No - patient declined information No - patient declined information No - patient declined information    Current Medications (verified) Outpatient Encounter Medications as of 06/05/2020  Medication Sig  . allopurinol (ZYLOPRIM) 100 MG tablet TAKE 1 TABLET BY MOUTH  DAILY  . amLODipine (NORVASC) 2.5 MG tablet Take 1 tablet (2.5 mg total) by mouth daily.  . baclofen (LIORESAL) 10 MG tablet TAKE 1/2 TO 1 TABLET BY  MOUTH 3 TIMES DAILY  . cyanocobalamin (,VITAMIN B-12,) 1000 MCG/ML injection 1000 mcg (1  mg) injection once per per month or as directed  . diclofenac sodium (VOLTAREN) 1 % GEL Apply 4 g topically 4 (four) times daily. (Patient taking differently: Apply 4 g topically as needed. )  . escitalopram (LEXAPRO) 20 MG tablet TAKE 1 TABLET BY MOUTH  DAILY  . ezetimibe (ZETIA) 10 MG tablet Take 1 tablet (10 mg total) by mouth daily.  . famotidine (PEPCID) 20 MG tablet Take 1 tablet (20 mg total) by mouth at bedtime.  Marland Kitchen levothyroxine (SYNTHROID, LEVOTHROID) 50 MCG tablet   . oxybutynin (DITROPAN) 5 MG tablet TAKE 1 TABLET BY MOUTH 2  TIMES DAILY AS NEEDED FOR  BLADDER SPASMS.  Marland Kitchen pantoprazole (PROTONIX) 40 MG tablet TAKE 1 TABLET BY MOUTH  TWICE DAILY 30 TO 60  MINUTES BEFORE MEALS  . rosuvastatin (CRESTOR) 40 MG tablet TAKE 1 TABLET BY MOUTH  DAILY  . topiramate (TOPAMAX) 200 MG tablet TAKE 1 TABLET BY MOUTH  DAILY  . [DISCONTINUED] orlistat (XENICAL) 120 MG capsule Take 1 capsule (120 mg total) by mouth 3 (three) times daily with meals.   No facility-administered encounter medications on file as of 06/05/2020.    Allergies (verified) Latex, Oxycodone, and Zofran [ondansetron hcl]   History: Past Medical History:  Diagnosis Date  . Anemia   . Anxiety   . Arthritis    back, neck  . Cancer ([place])    skin cancer- squamous cell- left arm and shoulder  . Chronic back pain   . Chronic kidney disease    kidney stones  . COPD (chronic obstructive pulmonary disease) ([place])   .  Depression   . Fibromyalgia   . GERD (gastroesophageal reflux disease)   . Heart murmur   . Hematuria   . History of adenomatous polyp of colon   . History of kidney stones   . History of seizure    x1 2007 post op (per pt neurologist work-up done ?mixture of anesthesia medication and prozac that pt had taken)  and has not any issues since   . History of squamous cell carcinoma excision   . Hyperlipidemia   . Hypertension   . IC (interstitial cystitis)   . Migraine   . Osteoporosis   . Pulmonary nodules     multiple per ct -- monitored by pcp  . Right ureteral stone   . RLS (restless legs syndrome)   . Seizures ([place])    one time incident- greater than 10 years ago, none since  . Thyroid disease    Past Surgical History:  Procedure Laterality Date  . CARDIOVASCULAR STRESS TEST  09-02-2004   normal perfusion study/  normal LV function and wall motion, ef 64%  . CARPAL TUNNEL RELEASE Right   . CERVICAL FUSION  1992  . CHOLECYSTECTOMY  1995  . COLONOSCOPY    . CYSTO/  BOTOX INJECTION  12-27-2008  &  04-01-2006  . CYSTO/  HYDRODISTENTION/  INSTILLATION THERAPY  03-04-2006  . CYSTOSCOPY W/ URETERAL STENT PLACEMENT Right 04/02/2016   Procedure: CYSTOSCOPY WITH RIGHT RETROGRADE, URETEROSCOPY PYELOGRAM LASER LITHOTRIPSY,/URETERAL STENT PLACEMENT;  Surgeon: Kathie Rhodes, MD;  Location: [facility];  Service: Urology;  Laterality: Right;  . HEMORRHOID SURGERY  01-03-2004  . HOLMIUM LASER APPLICATION Right 12/27/5972   Procedure: HOLMIUM LASER APPLICATION;  Surgeon: Kathie Rhodes, MD;  Location[facility];  Service: Urology;  Laterality: Right;  . LUMBAR FUSION  1996   L4 --S1  . ROTATOR CUFF REPAIR Left 2004  . SHOULDER ARTHROSCOPY WITH BICEPS TENDON REPAIR Left 03/ 2017  . SPINAL CORD STIMULATOR IMPLANT  x4  last one 2013  . STONE EXTRACTION WITH BASKET Right 04/02/2016   Procedure: STONE EXTRACTION WITH BASKET;  Surgeon: Kathie Rhodes, MD;  Location[facility];  Service: Urology;  Laterality: Right;  . TONSILLECTOMY  1964  . TRANSTHORACIC ECHOCARDIOGRAM  04-17-2014   EF 60-65%/  trivial AR, MR, and TR  . UPPER GASTROINTESTINAL ENDOSCOPY    . VAGINAL HYSTERECTOMY  1984  . WRIST GANGLION EXCISION Right 02-05-2004   Family History  Problem Relation Age of Onset  . Heart attack Mother        Mom 63, Dad 59  . Hypertension Mother   . Heart disease Mother   . Heart disease Father   . Diabetes Father   . Colon cancer Maternal Grandmother        not  sure age of onset  . Esophageal cancer Neg Hx   . Rectal cancer Neg Hx   . Stomach cancer Neg Hx    Social History   Socioeconomic History  . Marital status: Married    Spouse name: Antony Haste  . Number of children: 1  . Years of education: College  . Highest education level: Not on file  Occupational History  . Occupation: Retired Marine scientist  Tobacco Use  . Smoking status: Former Smoker    Packs/day: 1.25    Years: 40.00    Pack years: 50.00    Types: Cigarettes    Quit date: 06/07/2008    Years since quitting: 12.0  . Smokeless tobacco: Never  Used  Vaping Use  . Vaping Use: Never used  Substance and Sexual Activity  . Alcohol use: Yes    Alcohol/week: 0.0 standard drinks    Comment: VERY RARE  . Drug use: No  . Sexual activity: Not on file  Other Topics Concern  . Not on file  Social History Narrative   Lives with spouse, Antony Haste.    Caffeine use: 1-2 cups coffee per day   Married 47 years in December 2020. 1 daughter. 2 grandkids (boy and girl). Lives 10 miles away.       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       Hobbies: previously enjoyed dancing, old car shoes (6 chevy), reading   Social Determinants of Health   Financial Resource Strain: Low Risk   . Difficulty of Paying Living Expenses: Not hard at all  Food Insecurity: No Food Insecurity  . Worried About Charity fundraiser in the Last Year: Never true  . Ran Out of Food in the Last Year: Never true  Transportation Needs: No Transportation Needs  . Lack of Transportation (Medical): No  . Lack of Transportation (Non-Medical): No  Physical Activity: Inactive  . Days of Exercise per Week: 0 days  . Minutes of Exercise per Session: 0 min  Stress: No Stress Concern Present  . Feeling of Stress : Not at all  Social Connections: Moderately Isolated  . Frequency of Communication with Friends and Family: Twice a week  . Frequency of Social Gatherings with Friends and Family: Once a week  . Attends Religious  Services: Never  . Active Member of Clubs or Organizations: No  . Attends Archivist Meetings: Never  . Marital Status: Married    Tobacco Counseling Counseling given: Not Answered   Clinical Intake:  Pre-visit preparation completed: Yes  Pain : No/denies pain     BMI - recorded: 27.07 Nutritional Status: BMI 25 -29 Overweight Diabetes: No  How often do you need to have someone help you when you read instructions, pamphlets, or other written materials from your doctor or pharmacy?: 1 - Never  Diabetic?No  Interpreter Needed?: No  Information entered by :: Charlott Rakes, LPN   Activities of Daily Living In your present state of health, do you have any difficulty performing the following activities: 06/05/2020  Hearing? N  Vision? N  Difficulty concentrating or making decisions? N  Walking or climbing stairs? N  Dressing or bathing? N  Doing errands, shopping? N  Preparing Food and eating ? N  Using the Toilet? N  In the past six months, have you accidently leaked urine? Y  Comment urgency at times and kidney stone HX  Do you have problems with loss of bowel control? N  Managing your Medications? N  Managing your Finances? N  Housekeeping or managing your Housekeeping? N  Some recent data might be hidden    Patient Care Team: [name], MD as PCP - General (Family Medicine)  Indicate any recent Medical Services you may have received from other than [facility] providers in the past year (date may be approximate).     Assessment:   This is a routine wellness examination for Alison Brooks.  Hearing/Vision screen  Hearing Screening   125Hz  250Hz  500Hz  1000Hz  2000Hz  3000Hz  4000Hz  6000Hz  8000Hz   Right ear:           Left ear:           Comments: Pt will follow up with audiologist with concerns  of mild loss  Vision Screening Comments: Annual eye exams are followed up by eye dr in [place]  Dietary issues and exercise activities discussed: Current  Exercise Habits: The patient does not participate in regular exercise at present, Exercise limited by: None identified  Goals    . Patient Stated     Lose weight      Depression Screen PHQ 2/9 Scores 06/05/2020 05/23/2020 07/17/2019 12/05/2018 11/14/2017 10/11/2017 03/11/2016  PHQ - 2 Score 0 0 0 0 3 3 0  PHQ- 9 Score - 0 1 0 12 19 -    Fall Risk Fall Risk  06/05/2020  Falls in the past year? 0  Number falls in past yr: 0  Injury with Fall? 0  Risk for fall due to : Impaired vision  Follow up Falls prevention discussed    Any stairs in or around the home? Yes  If so, are there any without handrails? No  Home free of loose throw rugs in walkways, pet beds, electrical cords, etc? Yes  Adequate lighting in your home to reduce risk of falls? Yes   ASSISTIVE DEVICES UTILIZED TO PREVENT FALLS:  Life alert? No  Use of a cane, walker or w/c? No  Grab bars in the bathroom? No  Shower chair or bench in shower? No  Elevated toilet seat or a handicapped toilet? Yes   TIMED UP AND GO:  Was the test performed? No .    Cognitive Function:     6CIT Screen 06/05/2020  What Year? 0 points  What month? 0 points  Count back from 20 0 points  Months in reverse 2 points  Repeat phrase 0 points    Immunizations Immunization History  Administered Date(s) Administered  . Fluad Quad(high Dose 65+) 07/17/2019  . Influenza Whole 08/11/1999, 08/11/2007, 07/25/2009  . Influenza,inj,Quad PF,6+ Mos 09/03/2013, 07/12/2014, 08/04/2015  . Influenza-Unspecified 07/25/2017  . Moderna SARS-COVID-2 Vaccination 11/17/2019, 12/17/2019  . Pneumococcal Polysaccharide-23 07/17/2019  . Td 10/25/1998, 10/25/2006  . Tdap 04/20/2018    TDAP status: Up to date Flu Vaccine status: Up to date Pneumococcal vaccine status: Completed during today's visit. Covid-19 vaccine status: Completed vaccines  Qualifies for Shingles Vaccine? Yes   Zostavax completed No   Shingrix Completed?: No.    Education has been  provided regarding the importance of this vaccine. Patient has been advised to call insurance company to determine out of pocket expense if they have not yet received this vaccine. Advised may also receive vaccine at local pharmacy or Health Dept. Verbalized acceptance and understanding.  Screening Tests Health Maintenance  Topic Date Due  . MAMMOGRAM  07/27/2018  . DEXA SCAN  Never done  . INFLUENZA VACCINE  05/25/2020  . PNA vac Low Risk Adult (2 of 2 - PCV13) 07/16/2020  . COLONOSCOPY  10/15/2024  . TETANUS/TDAP  04/20/2028  . COVID-19 Vaccine  Completed  . Hepatitis C Screening  Completed    Health Maintenance  Health Maintenance Due  Topic Date Due  . MAMMOGRAM  07/27/2018  . DEXA SCAN  Never done  . INFLUENZA VACCINE  05/25/2020    Colorectal cancer screening: Completed 10/16/19. Repeat every presently following up years Mammogram status: Ordered 06/05/20. Pt provided with contact info and advised to call to schedule appt.  Bone Density status: Ordered 06/05/20. Pt provided with contact info and advised to call to schedule appt.    Additional Screening:  Hepatitis C Screening: Completed 07/1015  Vision Screening: Recommended annual ophthalmology exams for early detection of  glaucoma and other disorders of the eye. Is the patient up to date with their annual eye exam?  No  Who is the provider or what is the name of the office in which the patient attends annual eye exams? Will follow up with eye Dr in [place], Discussed the importance  Of Annual follow up  Dental Screening: Recommended annual dental exams for proper oral hygiene  Community Resource Referral / Chronic Care Management: CRR required this visit?  No   CCM required this visit?  No      Plan:     I have personally reviewed and noted the following in the patient's chart:   . Medical and social history . Use of alcohol, tobacco or illicit drugs  . Current medications and supplements . Functional  ability and status . Nutritional status . Physical activity . Advanced directives . List of other physicians . Hospitalizations, surgeries, and ER visits in previous 12 months . Vitals . Screenings to include cognitive, depression, and falls . Referrals and appointments  In addition, I have reviewed and discussed with patient certain preventive protocols, quality metrics, and best practice recommendations. A written personalized care plan for preventive services as well as general preventive health recommendations were provided to patient.     Willette Brace, LPN   3/53/2992   Nurse Notes: None

## 2020-06-06 ENCOUNTER — Other Ambulatory Visit: Payer: Self-pay

## 2020-06-06 ENCOUNTER — Telehealth: Payer: Self-pay | Admitting: Family Medicine

## 2020-06-06 DIAGNOSIS — N3010 Interstitial cystitis (chronic) without hematuria: Secondary | ICD-10-CM

## 2020-06-06 NOTE — Telephone Encounter (Signed)
Yes thanks-may refer under interstitial cystitis

## 2020-06-06 NOTE — Telephone Encounter (Signed)
Pt called asking if she could have a referral sent in for urology. She is still experiencing urinary symptoms. Pt states she called over to [facility] and the provider she used to see has now retired. Pt states they had to make her a new patient appointment and couldn't get her in for 6 weeks. Pt is asking if there is anyway we can try to get her in sooner. I also told pt there is a [facility] office in [place] which is closer to where she lives. I reached out to the office but was unable to get in touch with the new patient coordinator to see when they next available appointment is. Please advise.

## 2020-06-06 NOTE — Telephone Encounter (Signed)
Ok to start referral?  ?

## 2020-06-06 NOTE — Telephone Encounter (Signed)
Referral started  

## 2020-06-10 DIAGNOSIS — Z87442 Personal history of urinary calculi: Secondary | ICD-10-CM | POA: Diagnosis not present

## 2020-06-10 DIAGNOSIS — N3000 Acute cystitis without hematuria: Secondary | ICD-10-CM | POA: Diagnosis not present

## 2020-06-25 ENCOUNTER — Other Ambulatory Visit: Payer: Self-pay | Admitting: Family Medicine

## 2020-07-06 ENCOUNTER — Other Ambulatory Visit: Payer: Self-pay

## 2020-07-06 ENCOUNTER — Emergency Department (HOSPITAL_COMMUNITY): Payer: Medicare Other

## 2020-07-06 ENCOUNTER — Emergency Department (HOSPITAL_COMMUNITY)
Admission: EM | Admit: 2020-07-06 | Discharge: 2020-07-06 | Disposition: A | Discharge status: Home / Self Care | Payer: Medicare Other | Attending: Emergency Medicine | Admitting: Emergency Medicine

## 2020-07-06 DIAGNOSIS — J449 Chronic obstructive pulmonary disease, unspecified: Secondary | ICD-10-CM | POA: Diagnosis not present

## 2020-07-06 DIAGNOSIS — R109 Unspecified abdominal pain: Secondary | ICD-10-CM | POA: Diagnosis not present

## 2020-07-06 DIAGNOSIS — Z87891 Personal history of nicotine dependence: Secondary | ICD-10-CM | POA: Insufficient documentation

## 2020-07-06 DIAGNOSIS — I129 Hypertensive chronic kidney disease with stage 1 through stage 4 chronic kidney disease, or unspecified chronic kidney disease: Secondary | ICD-10-CM | POA: Insufficient documentation

## 2020-07-06 DIAGNOSIS — Z91040 Latex allergy status: Secondary | ICD-10-CM | POA: Diagnosis not present

## 2020-07-06 DIAGNOSIS — Z79899 Other long term (current) drug therapy: Secondary | ICD-10-CM | POA: Diagnosis not present

## 2020-07-06 DIAGNOSIS — I700 Atherosclerosis of aorta: Secondary | ICD-10-CM | POA: Diagnosis not present

## 2020-07-06 DIAGNOSIS — R001 Bradycardia, unspecified: Secondary | ICD-10-CM | POA: Diagnosis not present

## 2020-07-06 DIAGNOSIS — N189 Chronic kidney disease, unspecified: Secondary | ICD-10-CM | POA: Diagnosis not present

## 2020-07-06 DIAGNOSIS — M549 Dorsalgia, unspecified: Secondary | ICD-10-CM | POA: Diagnosis not present

## 2020-07-06 DIAGNOSIS — Z79890 Hormone replacement therapy (postmenopausal): Secondary | ICD-10-CM | POA: Insufficient documentation

## 2020-07-06 DIAGNOSIS — E039 Hypothyroidism, unspecified: Secondary | ICD-10-CM | POA: Diagnosis not present

## 2020-07-06 LAB — D-DIMER, QUANTITATIVE: D-Dimer, Quant: 0.38 ug/mL-FEU (ref 0.00–0.50)

## 2020-07-06 MED ORDER — HYDROCODONE-ACETAMINOPHEN 5-325 MG PO TABS
1.0000 | ORAL_TABLET | Freq: Once | ORAL | Status: AC
  Administered 2020-07-06: 1 via ORAL
  Filled 2020-07-06: qty 1

## 2020-07-06 NOTE — ED Provider Notes (Signed)
[facility] EMERGENCY DEPARTMENT Provider Note   CSN: [id number] Arrival date & time: 07/06/20  0116     History Chief Complaint  Patient presents with  . Back Pain    Alison Brooks is a 64 y.o. female.  HPI     This is a 64 year old female with a history of chronic kidney disease, COPD, fibromyalgia, hypertension, hyperlipidemia who presents with back and flank pain.  Patient reports that she has had a 1 to 30-month history of waxing and waning left-sided flank pain.  She states that it also is under her left breast and radiates around to her left back.  Over the last week it has worsened.  It is tender to touch.  She has not noted any rash.  She has not had any hematuria or dysuria.  She states that the pain is often worse with deep breathing.  She denies shortness of breath.  She rates her pain 8 out of 10.  She has taken Tylenol and a muscle relaxer with some relief but pain returns.  Patient denies bowel or bladder difficulty, denies weakness, numbness, tingling of the lower extremities.  Past Medical History:  Diagnosis Date  . Anemia   . Anxiety   . Arthritis    back, neck  . Cancer ([place])    skin cancer- squamous cell- left arm and shoulder  . Chronic back pain   . Chronic kidney disease    kidney stones  . COPD (chronic obstructive pulmonary disease) ([place])   . Depression   . Fibromyalgia   . GERD (gastroesophageal reflux disease)   . Heart murmur   . Hematuria   . History of adenomatous polyp of colon   . History of kidney stones   . History of seizure    x1 2007 post op (per pt neurologist work-up done ?mixture of anesthesia medication and prozac that pt had taken)  and has not any issues since   . History of squamous cell carcinoma excision   . Hyperlipidemia   . Hypertension   . IC (interstitial cystitis)   . Migraine   . Osteoporosis   . Pulmonary nodules    multiple per ct -- monitored by pcp  . Right ureteral stone   . RLS (restless legs syndrome)     . Seizures ([place])    one time incident- greater than 10 years ago, none since  . Thyroid disease     Patient Active Problem List   Diagnosis Date Noted  . History of total hysterectomy 07/17/2019  . Hypothyroidism 07/17/2019  . Vertigo 11/06/2016  . Dyspnea 11/02/2014  . Upper airway cough syndrome 11/02/2014  . Gout 06/28/2014  . Vitamin B12 deficiency 06/28/2014  . Multiple pulmonary nodules 06/28/2014  . Essential hypertension 05/17/2014  . Obesity (BMI 30-39.9) 01/14/2014  . Lateral epicondylitis (tennis elbow) 01/02/2014  . GERD (gastroesophageal reflux disease) 03/26/2013  . LEG CRAMPS 05/16/2009  . RESTLESS LEG SYNDROME, SEVERE 03/14/2009  . Chronic interstitial cystitis 01/20/2009  . Depression, major, recurrent ([place]) 03/19/2008  . TUBULOVILLOUS ADENOMA, COLON 02/17/2008  . Hyperlipidemia 02/17/2008  . NEPHROLITHIASIS 02/17/2008  . Former smoker 12/21/2007  . MIGRAINE, CLASSICAL W/O INTRACTABLE MIGRAINE 06/09/2007  . SYMPTOM, MEMORY LOSS 06/09/2007  . 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 FIBROMYALGIA 04/21/2007    Past Surgical History:  Procedure Laterality Date  . CARDIOVASCULAR STRESS TEST  09-02-2004   normal perfusion study/  normal LV function and wall motion,  ef 64%  . CARPAL TUNNEL RELEASE Right   . CERVICAL FUSION  1992  . CHOLECYSTECTOMY  1995  . COLONOSCOPY    . CYSTO/  BOTOX INJECTION  12-27-2008  &  04-01-2006  . CYSTO/  HYDRODISTENTION/  INSTILLATION THERAPY  03-04-2006  . CYSTOSCOPY W/ URETERAL STENT PLACEMENT Right 04/02/2016   Procedure: CYSTOSCOPY WITH RIGHT RETROGRADE, URETEROSCOPY PYELOGRAM LASER LITHOTRIPSY,/URETERAL STENT PLACEMENT;  Surgeon: Kathie Rhodes, MD;  Location: [facility];  Service: Urology;  Laterality: Right;  . HEMORRHOID SURGERY  01-03-2004  . HOLMIUM LASER APPLICATION Right 05/29/2777   Procedure: HOLMIUM LASER APPLICATION;  Surgeon: Kathie Rhodes, MD;  Location: [facility];  Service: Urology;  Laterality: Right;  . LUMBAR FUSION  1996   L4 --S1  . ROTATOR CUFF REPAIR Left 2004  . SHOULDER ARTHROSCOPY WITH BICEPS TENDON REPAIR Left 03/ 2017  . SPINAL CORD STIMULATOR IMPLANT  x4  last one 2013  . STONE EXTRACTION WITH BASKET Right 04/02/2016   Procedure: STONE EXTRACTION WITH BASKET;  Surgeon: Kathie Rhodes, MD;  Location: [facility];  Service: Urology;  Laterality: Right;  . TONSILLECTOMY  1964  . TRANSTHORACIC ECHOCARDIOGRAM  04-17-2014   EF 60-65%/  trivial AR, MR, and TR  . UPPER GASTROINTESTINAL ENDOSCOPY    . VAGINAL HYSTERECTOMY  1984  . WRIST GANGLION EXCISION Right 02-05-2004     OB History   No obstetric history on file.     Family History  Problem Relation Age of Onset  . Heart attack Mother        Mom 35, Dad 74  . Hypertension Mother   . Heart disease Mother   . Heart disease Father   . Diabetes Father   . Colon cancer Maternal Grandmother        not sure age of onset  . Esophageal cancer Neg Hx   . Rectal cancer Neg Hx   . Stomach cancer Neg Hx     Social History   Tobacco Use  . Smoking status: Former Smoker    Packs/day: 1.25    Years: 40.00    Pack years: 50.00    Types: Cigarettes    Quit date: 06/07/2008    Years since quitting: 12.0  . Smokeless tobacco: Never Used  Vaping Use  . Vaping Use: Never used  Substance Use Topics  . Alcohol use: Yes    Alcohol/week: 0.0 standard drinks    Comment: VERY RARE  . Drug use: No    Home Medications Prior to Admission medications   Medication Sig Start Date End Date Taking? Authorizing Provider  allopurinol (ZYLOPRIM) 100 MG tablet TAKE 1 TABLET BY MOUTH  DAILY 12/24/19   [name], MD  amLODipine (NORVASC) 2.5 MG tablet Take 1 tablet (2.5 mg total) by mouth daily. 10/11/17   [name], MD  baclofen (LIORESAL) 10 MG tablet TAKE 1/2 TO 1 TABLET BY  MOUTH 3 TIMES DAILY 04/24/20   [name], MD  cyanocobalamin (,VITAMIN  B-12,) 1000 MCG/ML injection 1000 mcg (1 mg) injection once per per month or as directed 07/19/19   [name], MD  diclofenac sodium (VOLTAREN) 1 % GEL Apply 4 g topically 4 (four) times daily. Patient taking differently: Apply 4 g topically as needed.  04/20/18   Vivi Barrack, MD  escitalopram (LEXAPRO) 20 MG tablet TAKE 1 TABLET BY MOUTH  DAILY 12/24/19   [name], MD  ezetimibe (ZETIA) 10 MG tablet  TAKE 1 TABLET BY MOUTH  DAILY 06/25/20   [name], MD  famotidine (PEPCID) 20 MG tablet Take 1 tablet (20 mg total) by mouth at bedtime. 08/03/18   Irene Shipper, MD  levothyroxine (SYNTHROID, LEVOTHROID) 50 MCG tablet  11/22/18   [provider]  oxybutynin (DITROPAN) 5 MG tablet TAKE 1 TABLET BY MOUTH 2  TIMES DAILY AS NEEDED FOR  BLADDER SPASMS. 10/11/17   [name], MD  pantoprazole (PROTONIX) 40 MG tablet TAKE 1 TABLET BY MOUTH  TWICE DAILY 30 TO 60  MINUTES BEFORE MEALS 04/24/20   Irene Shipper, MD  rosuvastatin (CRESTOR) 40 MG tablet TAKE 1 TABLET BY MOUTH  DAILY 12/24/19   [name], MD  topiramate (TOPAMAX) 200 MG tablet TAKE 1 TABLET BY MOUTH  DAILY 03/18/20   [name], MD  orlistat (XENICAL) 120 MG capsule Take 1 capsule (120 mg total) by mouth 3 (three) times daily with meals. 05/30/15 02/29/16  [name], MD    Allergies    Latex, Oxycodone, and Zofran Alvis Lemmings hcl]  Review of Systems   Review of Systems  Constitutional: Negative for fever.  Respiratory: Negative for cough and shortness of breath.   Cardiovascular: Negative for chest pain.  Gastrointestinal: Negative for abdominal pain, nausea and vomiting.  Genitourinary: Positive for flank pain. Negative for dysuria and hematuria.  Musculoskeletal: Positive for back pain.  Skin: Negative for rash.  All other systems reviewed and are negative.   Physical Exam Updated Vital Signs BP (!) 173/110   Pulse 71   Temp 98.1 F (36.7 C) (Oral)   Resp 16   Ht 1.575 m (5'  2")   Wt 67.1 kg   SpO2 100%   BMI 27.07 kg/m   Physical Exam Vitals and nursing note reviewed.  Constitutional:      Appearance: She is well-developed. She is not ill-appearing.  HENT:     Head: Normocephalic and atraumatic.     Nose: Nose normal.     Mouth/Throat:     Mouth: Mucous membranes are moist.  Eyes:     Pupils: Pupils are equal, round, and reactive to light.  Cardiovascular:     Rate and Rhythm: Normal rate and regular rhythm.     Heart sounds: Normal heart sounds.  Pulmonary:     Effort: Pulmonary effort is normal. No respiratory distress.     Breath sounds: No wheezing.  Chest:     Chest wall: Tenderness present.  Abdominal:     General: Bowel sounds are normal.     Palpations: Abdomen is soft.     Tenderness: There is no right CVA tenderness, left CVA tenderness, guarding or rebound.  Musculoskeletal:     Cervical back: Neck supple.     Right lower leg: No edema.     Left lower leg: No edema.  Skin:    General: Skin is warm and dry.     Findings: No rash.  Neurological:     Mental Status: She is alert and oriented to person, place, and time.  Psychiatric:        Mood and Affect: Mood normal.     ED Results / Procedures / Treatments   Labs (all labs ordered are listed, but only abnormal results are displayed) Labs Reviewed  D-DIMER, QUANTITATIVE (NOT AT [facility])    EKG EKG Interpretation  Date/Time:  Sunday July 06 2020 02:59:05 EDT Ventricular Rate:  53 PR Interval:  158 QRS Duration:  70 QT Interval:  430 QTC Calculation: 403 R Axis:   58 Text Interpretation: Sinus bradycardia Nonspecific T wave abnormality Abnormal ECG No significant change since last tracing Confirmed by Thayer Jew [phone]) on 07/06/2020 3:53:15 AM   Radiology DG Chest 2 View  Result Date: 07/06/2020 CLINICAL DATA:  Left back pain for 4 months but worse over the last 2 days. EXAM: CHEST - 2 VIEW COMPARISON:  Chest radiograph 01/31/2015.  CT chest 11/20/2019  FINDINGS: Normal heart size and pulmonary vascularity. The lungs are clear and expanded. No pleural effusions. No pneumothorax. Mediastinal contours appear intact. Calcification of the aorta. Surgical clips in the right upper quadrant. Spinal stimulator leads over the mid and lower thoracic spine. Degenerative changes in the spine. IMPRESSION: No active cardiopulmonary disease. Electronically Signed   By: Lucienne Capers M.D.   On: 07/06/2020 03:25    Procedures Procedures (including critical care time)  Medications Ordered in ED Medications  HYDROcodone-acetaminophen (NORCO/VICODIN) 5-325 MG per tablet 1 tablet (1 tablet Oral Given 07/06/20 0302)    ED Course  I have reviewed the triage vital signs and the nursing notes.  Pertinent labs & imaging results that were available during my care of the patient were reviewed by me and considered in my medical decision making (see chart for details).    MDM Rules/Calculators/A&P                           Patient presents with worsening left flank pain.  She is overall nontoxic-appearing.  Vital signs notable for blood pressure of 173/110.  She has reproducible tenderness over the left flank and under the left breast.  However, she has some pleuritic nature to the pain as well.  EKG shows no evidence of acute ischemia or arrhythmia.  Screening D-dimer was sent to evaluate for blood clots given pleuritic nature of pain.  This is negative.  Chest x-ray shows no evidence of pneumothorax or pneumonia.  There is no evidence of rash to suggest shingles.  Given reproducible nature, suspect musculoskeletal etiology.  No signs or symptoms of cauda equina.  No symptoms of urinary tract infection or kidney stones.  Recommend supportive measures and follow-up with primary physician.  After history, exam, and medical workup I feel the patient has been appropriately medically screened and is safe for discharge home. Pertinent diagnoses were discussed with the  patient. Patient was given return precautions.   Final Clinical Impression(s) / ED Diagnoses Final diagnoses:  Left flank pain    Rx / DC Orders ED Discharge Orders    None       Kingslee Dowse, Barbette Hair, MD 07/06/20 [id number]

## 2020-07-06 NOTE — ED Triage Notes (Signed)
Pt c/o left back pain that has lasted for approx 4 months that has gotten progressively worse over the last 2 days. Pt states pain radiates to left side and worse when laying down.

## 2020-07-06 NOTE — Discharge Instructions (Addendum)
You were seen today for left-sided back and flank pain.  Your work-up is reassuring.  Continue Tylenol or Motrin as needed.  Apply heat.  Follow-up closely with your primary physician.

## 2020-07-09 ENCOUNTER — Ambulatory Visit
Admission: EM | Admit: 2020-07-09 | Discharge: 2020-07-09 | Disposition: A | Discharge status: Home / Self Care | Payer: Medicare Other | Attending: Emergency Medicine | Admitting: Emergency Medicine

## 2020-07-09 DIAGNOSIS — R109 Unspecified abdominal pain: Secondary | ICD-10-CM

## 2020-07-09 MED ORDER — PREDNISONE 10 MG PO TABS: 20.0000 mg | ORAL_TABLET | Freq: Every day | ORAL | 0 refills | Status: DC

## 2020-07-09 MED ORDER — IBUPROFEN 800 MG PO TABS: 800.0000 mg | ORAL_TABLET | Freq: Three times a day (TID) | ORAL | 0 refills | Status: DC

## 2020-07-09 NOTE — ED Triage Notes (Signed)
Pt presents with left side flank pain for past 4 months, seen in ED on 9/12 for same

## 2020-07-09 NOTE — ED Provider Notes (Addendum)
[facility]   [id number] 07/09/20 Arrival Time: 1110   Chief Complaint  Patient presents with  . Back Pain     SUBJECTIVE: History from: family.  Alison Brooks is a 64 y.o. female who presented to the urgent care for complaint of left-sided flank pain for the past 4 months.  Was seen at the emergency department on 07/06/2020.  Chest x-ray was negative denies a precipitating event.  Localized pain to the left foot.  He describes the pain as constant and achy.  She has tried OTC medications without relief.  Her symptoms are made worse with ROM.  She denies similar symptoms in the past.  Denies chills, fever, nausea, vomiting, diarrhea     ROS: As per HPI.  All other pertinent ROS negative.     Past Medical History:  Diagnosis Date  . Anemia   . Anxiety   . Arthritis    back, neck  . Cancer ([place])    skin cancer- squamous cell- left arm and shoulder  . Chronic back pain   . Chronic kidney disease    kidney stones  . COPD (chronic obstructive pulmonary disease) ([place])   . Depression   . Fibromyalgia   . GERD (gastroesophageal reflux disease)   . Heart murmur   . Hematuria   . History of adenomatous polyp of colon   . History of kidney stones   . History of seizure    x1 2007 post op (per pt neurologist work-up done ?mixture of anesthesia medication and prozac that pt had taken)  and has not any issues since   . History of squamous cell carcinoma excision   . Hyperlipidemia   . Hypertension   . IC (interstitial cystitis)   . Migraine   . Osteoporosis   . Pulmonary nodules    multiple per ct -- monitored by pcp  . Right ureteral stone   . RLS (restless legs syndrome)   . Seizures ([place])    one time incident- greater than 10 years ago, none since  . Thyroid disease    Past Surgical History:  Procedure Laterality Date  . CARDIOVASCULAR STRESS TEST  09-02-2004   normal perfusion study/  normal LV function and wall motion, ef 64%  . CARPAL TUNNEL RELEASE  Right   . CERVICAL FUSION  1992  . CHOLECYSTECTOMY  1995  . COLONOSCOPY    . CYSTO/  BOTOX INJECTION  12-27-2008  &  04-01-2006  . CYSTO/  HYDRODISTENTION/  INSTILLATION THERAPY  03-04-2006  . CYSTOSCOPY W/ URETERAL STENT PLACEMENT Right 04/02/2016   Procedure: CYSTOSCOPY WITH RIGHT RETROGRADE, URETEROSCOPY PYELOGRAM LASER LITHOTRIPSY,/URETERAL STENT PLACEMENT;  Surgeon: Kathie Rhodes, MD;  Location: [facility];  Service: Urology;  Laterality: Right;  . HEMORRHOID SURGERY  01-03-2004  . HOLMIUM LASER APPLICATION Right 11/26/252   Procedure: HOLMIUM LASER APPLICATION;  Surgeon: Kathie Rhodes, MD;  Location: [facility];  Service: Urology;  Laterality: Right;  . LUMBAR FUSION  1996   L4 --S1  . ROTATOR CUFF REPAIR Left 2004  . SHOULDER ARTHROSCOPY WITH BICEPS TENDON REPAIR Left 03/ 2017  . SPINAL CORD STIMULATOR IMPLANT  x4  last one 2013  . STONE EXTRACTION WITH BASKET Right 04/02/2016   Procedure: STONE EXTRACTION WITH BASKET;  Surgeon: Kathie Rhodes, MD;  Location: [facility];  Service: Urology;  Laterality: Right;  . TONSILLECTOMY  1964  . TRANSTHORACIC ECHOCARDIOGRAM  04-17-2014   EF 60-65%/  trivial AR, MR, and TR  .  UPPER GASTROINTESTINAL ENDOSCOPY    . VAGINAL HYSTERECTOMY  1984  . WRIST GANGLION EXCISION Right 02-05-2004   Allergies  Allergen Reactions  . Latex Other (See Comments)    blisters  . Oxycodone Nausea And Vomiting    And sweating  . Zofran [Ondansetron Hcl] Other (See Comments)    headache   No current facility-administered medications on file prior to encounter.   Current Outpatient Medications on File Prior to Encounter  Medication Sig Dispense Refill  . allopurinol (ZYLOPRIM) 100 MG tablet TAKE 1 TABLET BY MOUTH  DAILY 90 tablet 3  . amLODipine (NORVASC) 2.5 MG tablet Take 1 tablet (2.5 mg total) by mouth daily. 90 tablet 3  . baclofen (LIORESAL) 10 MG tablet TAKE 1/2 TO 1 TABLET BY  MOUTH 3 TIMES DAILY 180 tablet 0    . cyanocobalamin (,VITAMIN B-12,) 1000 MCG/ML injection 1000 mcg (1 mg) injection once per per month or as directed 30 mL 0  . diclofenac sodium (VOLTAREN) 1 % GEL Apply 4 g topically 4 (four) times daily. (Patient taking differently: Apply 4 g topically as needed. ) 100 g 1  . escitalopram (LEXAPRO) 20 MG tablet TAKE 1 TABLET BY MOUTH  DAILY 90 tablet 3  . ezetimibe (ZETIA) 10 MG tablet TAKE 1 TABLET BY MOUTH  DAILY 90 tablet 3  . famotidine (PEPCID) 20 MG tablet Take 1 tablet (20 mg total) by mouth at bedtime. 30 tablet 6  . levothyroxine (SYNTHROID, LEVOTHROID) 50 MCG tablet     . oxybutynin (DITROPAN) 5 MG tablet TAKE 1 TABLET BY MOUTH 2  TIMES DAILY AS NEEDED FOR  BLADDER SPASMS. 180 tablet 3  . pantoprazole (PROTONIX) 40 MG tablet TAKE 1 TABLET BY MOUTH  TWICE DAILY 30 TO 60  MINUTES BEFORE MEALS 180 tablet 1  . rosuvastatin (CRESTOR) 40 MG tablet TAKE 1 TABLET BY MOUTH  DAILY 90 tablet 3  . topiramate (TOPAMAX) 200 MG tablet TAKE 1 TABLET BY MOUTH  DAILY 90 tablet 3  . [DISCONTINUED] orlistat (XENICAL) 120 MG capsule Take 1 capsule (120 mg total) by mouth 3 (three) times daily with meals. 90 capsule 5   Social History   Socioeconomic History  . Marital status: Married    Spouse name: Antony Haste  . Number of children: 1  . Years of education: College  . Highest education level: Not on file  Occupational History  . Occupation: Retired Marine scientist  Tobacco Use  . Smoking status: Former Smoker    Packs/day: 1.25    Years: 40.00    Pack years: 50.00    Types: Cigarettes    Quit date: 06/07/2008    Years since quitting: 12.0  . Smokeless tobacco: Never Used  Vaping Use  . Vaping Use: Never used  Substance and Sexual Activity  . Alcohol use: Yes    Alcohol/week: 0.0 standard drinks    Comment: VERY RARE  . Drug use: No  . Sexual activity: Not on file  Other Topics Concern  . Not on file  Social History Narrative   Lives with spouse, Antony Haste.    Caffeine use: 1-2 cups coffee per day    Married 47 years in December 2020. 1 daughter. 2 grandkids (boy and girl). Lives 10 miles away.       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       Hobbies: previously enjoyed dancing, old car shoes (64 chevy), reading   Social Determinants of Health   Financial Resource Strain: Low Risk   .  Difficulty of Paying Living Expenses: Not hard at all  Food Insecurity: No Food Insecurity  . Worried About Charity fundraiser in the Last Year: Never true  . Ran Out of Food in the Last Year: Never true  Transportation Needs: No Transportation Needs  . Lack of Transportation (Medical): No  . Lack of Transportation (Non-Medical): No  Physical Activity: Inactive  . Days of Exercise per Week: 0 days  . Minutes of Exercise per Session: 0 min  Stress: No Stress Concern Present  . Feeling of Stress : Not at all  Social Connections: Moderately Isolated  . Frequency of Communication with Friends and Family: Twice a week  . Frequency of Social Gatherings with Friends and Family: Once a week  . Attends Religious Services: Never  . Active Member of Clubs or Organizations: No  . Attends Archivist Meetings: Never  . Marital Status: Married  Human resources officer Violence: Not At Risk  . Fear of Current or Ex-Partner: No  . Emotionally Abused: No  . Physically Abused: No  . Sexually Abused: No   Family History  Problem Relation Age of Onset  . Heart attack Mother        Mom 78, Dad 37  . Hypertension Mother   . Heart disease Mother   . Heart disease Father   . Diabetes Father   . Colon cancer Maternal Grandmother        not sure age of onset  . Esophageal cancer Neg Hx   . Rectal cancer Neg Hx   . Stomach cancer Neg Hx     OBJECTIVE:  Vitals:   07/09/20 1132  BP: 136/68  Pulse: 67  Resp: 16  Temp: 98.5 F (36.9 C)  TempSrc: Oral  SpO2: 99%     Physical Exam Vitals and nursing note reviewed.  Constitutional:      General: She is not in acute distress.    Appearance:  Normal appearance. She is normal weight. She is not ill-appearing, toxic-appearing or diaphoretic.  HENT:     Head: Normocephalic.  Cardiovascular:     Rate and Rhythm: Normal rate and regular rhythm.     Pulses: Normal pulses.     Heart sounds: Normal heart sounds. No murmur heard.  No friction rub. No gallop.   Pulmonary:     Effort: Pulmonary effort is normal. No respiratory distress.     Breath sounds: Normal breath sounds. No stridor. No wheezing, rhonchi or rales.  Chest:     Chest wall: No tenderness.  Abdominal:     General: Abdomen is flat. Bowel sounds are normal. There is no distension.     Palpations: There is no mass.     Tenderness: There is no abdominal tenderness. There is no right CVA tenderness, left CVA tenderness, guarding or rebound.     Hernia: No hernia is present.  Musculoskeletal:     Cervical back: No spasms or tenderness.     Thoracic back: Normal. No spasms or tenderness.     Lumbar back: Normal. No spasms or tenderness.  Neurological:     Mental Status: She is alert and oriented to person, place, and time.     LABS:  No results found for this or any previous visit (from the past 24 hour(s)).   ASSESSMENT & PLAN:  1. Left flank pain     Meds ordered this encounter  Medications  . ibuprofen (ADVIL) 800 MG tablet    Sig: Take 1 tablet (800  mg total) by mouth 3 (three) times daily.    Dispense:  30 tablet    Refill:  0  . predniSONE (DELTASONE) 10 MG tablet    Sig: Take 2 tablets (20 mg total) by mouth daily.    Dispense:  15 tablet    Refill:  0    Discharge instructions  Prednisone was prescribed/take as directed Take Tylenol/ibuprofen as needed for pain Follow-up with PCP Return or go to ED for worsening of symptoms   Reviewed expectations re: course of current medical issues. Questions answered. Outlined signs and symptoms indicating need for more acute intervention. Patient verbalized understanding. After Visit Summary  given.      Note: This document was prepared using Dragon voice recognition software and may include unintentional dictation errors.    Emerson Monte, FNP 07/09/20 1229    Emerson Monte, FNP 07/09/20 1229

## 2020-07-09 NOTE — Discharge Instructions (Addendum)
Prednisone was prescribed/take as directed Take Tylenol/ibuprofen as needed for pain Follow-up with PCP Return or go to ED for worsening of symptoms

## 2020-07-30 DIAGNOSIS — M546 Pain in thoracic spine: Secondary | ICD-10-CM | POA: Diagnosis not present

## 2020-09-17 ENCOUNTER — Other Ambulatory Visit: Payer: Self-pay | Admitting: Internal Medicine

## 2020-09-17 DIAGNOSIS — K219 Gastro-esophageal reflux disease without esophagitis: Secondary | ICD-10-CM

## 2020-09-20 ENCOUNTER — Other Ambulatory Visit: Payer: Self-pay

## 2020-09-20 ENCOUNTER — Encounter: Payer: Self-pay | Admitting: Emergency Medicine

## 2020-09-20 ENCOUNTER — Ambulatory Visit
Admission: EM | Admit: 2020-09-20 | Discharge: 2020-09-20 | Disposition: A | Discharge status: Home / Self Care | Payer: Medicare Other | Attending: Family Medicine | Admitting: Family Medicine

## 2020-09-20 DIAGNOSIS — S61012A Laceration without foreign body of left thumb without damage to nail, initial encounter: Secondary | ICD-10-CM | POA: Diagnosis not present

## 2020-09-20 NOTE — ED Triage Notes (Signed)
Pt cut LT thumb on knife last night.  Tried using steri strips and liquid band aid but area is still bleeding on and off.

## 2020-09-20 NOTE — ED Provider Notes (Signed)
[facility]    CSN: [id number] Arrival date & time: 09/20/20  0858      History   Chief Complaint Chief Complaint  Patient presents with  . Laceration    HPI Alison Brooks is a 64 y.o. female.   HPI Patient was removing a protector from a knife and inadvertently sustained a laceration to left thumb. She attempted to control bleeding with steri-strips and thumb continued to bleed. She is not prescribed anticoagulant therapy.  Past Medical History:  Diagnosis Date  . Anemia   . Anxiety   . Arthritis    back, neck  . Cancer ([place])    skin cancer- squamous cell- left arm and shoulder  . Chronic back pain   . Chronic kidney disease    kidney stones  . COPD (chronic obstructive pulmonary disease) ([place])   . Depression   . Fibromyalgia   . GERD (gastroesophageal reflux disease)   . Heart murmur   . Hematuria   . History of adenomatous polyp of colon   . History of kidney stones   . History of seizure    x1 2007 post op (per pt neurologist work-up done ?mixture of anesthesia medication and prozac that pt had taken)  and has not any issues since   . History of squamous cell carcinoma excision   . Hyperlipidemia   . Hypertension   . IC (interstitial cystitis)   . Migraine   . Osteoporosis   . Pulmonary nodules    multiple per ct -- monitored by pcp  . Right ureteral stone   . RLS (restless legs syndrome)   . Seizures ([place])    one time incident- greater than 10 years ago, none since  . Thyroid disease     Patient Active Problem List   Diagnosis Date Noted  . History of total hysterectomy 07/17/2019  . Hypothyroidism 07/17/2019  . Vertigo 11/06/2016  . Dyspnea 11/02/2014  . Upper airway cough syndrome 11/02/2014  . Gout 06/28/2014  . Vitamin B12 deficiency 06/28/2014  . Multiple pulmonary nodules 06/28/2014  . Essential hypertension 05/17/2014  . Obesity (BMI 30-39.9) 01/14/2014  . Lateral epicondylitis (tennis elbow) 01/02/2014  . GERD  (gastroesophageal reflux disease) 03/26/2013  . LEG CRAMPS 05/16/2009  . RESTLESS LEG SYNDROME, SEVERE 03/14/2009  . Chronic interstitial cystitis 01/20/2009  . Depression, major, recurrent ([place]) 03/19/2008  . TUBULOVILLOUS ADENOMA, COLON 02/17/2008  . Hyperlipidemia 02/17/2008  . NEPHROLITHIASIS 02/17/2008  . Former smoker 12/21/2007  . MIGRAINE, CLASSICAL W/O INTRACTABLE MIGRAINE 06/09/2007  . SYMPTOM, MEMORY LOSS 06/09/2007  . 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 FIBROMYALGIA 04/21/2007    Past Surgical History:  Procedure Laterality Date  . CARDIOVASCULAR STRESS TEST  09-02-2004   normal perfusion study/  normal LV function and wall motion, ef 64%  . CARPAL TUNNEL RELEASE Right   . CERVICAL FUSION  1992  . CHOLECYSTECTOMY  1995  . COLONOSCOPY    . CYSTO/  BOTOX INJECTION  12-27-2008  &  04-01-2006  . CYSTO/  HYDRODISTENTION/  INSTILLATION THERAPY  03-04-2006  . CYSTOSCOPY W/ URETERAL STENT PLACEMENT Right 04/02/2016   Procedure: CYSTOSCOPY WITH RIGHT RETROGRADE, URETEROSCOPY PYELOGRAM LASER LITHOTRIPSY,/URETERAL STENT PLACEMENT;  Surgeon: Kathie Rhodes, MD;  Location: [facility];  Service: Urology;  Laterality: Right;  . HEMORRHOID SURGERY  01-03-2004  . HOLMIUM LASER APPLICATION Right 2/0/9470   Procedure: HOLMIUM LASER APPLICATION;  Surgeon: Kathie Rhodes, MD;  Location: [facility];  Service: Urology;  Laterality: Right;  . LUMBAR FUSION  1996   L4 --S1  . ROTATOR CUFF REPAIR Left 2004  . SHOULDER ARTHROSCOPY WITH BICEPS TENDON REPAIR Left 03/ 2017  . SPINAL CORD STIMULATOR IMPLANT  x4  last one 2013  . STONE EXTRACTION WITH BASKET Right 04/02/2016   Procedure: STONE EXTRACTION WITH BASKET;  Surgeon: Kathie Rhodes, MD;  Location: [facility];  Service: Urology;  Laterality: Right;  . TONSILLECTOMY  1964  . TRANSTHORACIC ECHOCARDIOGRAM  04-17-2014   EF 60-65%/  trivial AR, MR, and TR  . UPPER  GASTROINTESTINAL ENDOSCOPY    . VAGINAL HYSTERECTOMY  1984  . WRIST GANGLION EXCISION Right 02-05-2004    OB History   No obstetric history on file.      Home Medications    Prior to Admission medications   Medication Sig Start Date End Date Taking? Authorizing Provider  allopurinol (ZYLOPRIM) 100 MG tablet TAKE 1 TABLET BY MOUTH  DAILY 12/24/19   [name], MD  amLODipine (NORVASC) 2.5 MG tablet Take 1 tablet (2.5 mg total) by mouth daily. 10/11/17   [name], MD  baclofen (LIORESAL) 10 MG tablet TAKE 1/2 TO 1 TABLET BY  MOUTH 3 TIMES DAILY 04/24/20   [name], MD  cyanocobalamin (,VITAMIN B-12,) 1000 MCG/ML injection 1000 mcg (1 mg) injection once per per month or as directed 07/19/19   [name], MD  diclofenac sodium (VOLTAREN) 1 % GEL Apply 4 g topically 4 (four) times daily. Patient taking differently: Apply 4 g topically as needed.  04/20/18   Vivi Barrack, MD  escitalopram (LEXAPRO) 20 MG tablet TAKE 1 TABLET BY MOUTH  DAILY 12/24/19   [name], MD  ezetimibe (ZETIA) 10 MG tablet TAKE 1 TABLET BY MOUTH  DAILY 06/25/20   [name], MD  famotidine (PEPCID) 20 MG tablet Take 1 tablet (20 mg total) by mouth at bedtime. 08/03/18   Irene Shipper, MD  ibuprofen (ADVIL) 800 MG tablet Take 1 tablet (800 mg total) by mouth 3 (three) times daily. 07/09/20   Avegno, Darrelyn Hillock, FNP  levothyroxine (SYNTHROID, LEVOTHROID) 50 MCG tablet  11/22/18   [provider]  oxybutynin (DITROPAN) 5 MG tablet TAKE 1 TABLET BY MOUTH 2  TIMES DAILY AS NEEDED FOR  BLADDER SPASMS. 10/11/17   [name], MD  pantoprazole (PROTONIX) 40 MG tablet TAKE 1 TABLET BY MOUTH  TWICE DAILY 30 TO 60  MINUTES BEFORE MEALS 04/24/20   Irene Shipper, MD  predniSONE (DELTASONE) 10 MG tablet Take 2 tablets (20 mg total) by mouth daily. 07/09/20   Avegno, Darrelyn Hillock, FNP  rosuvastatin (CRESTOR) 40 MG tablet TAKE 1 TABLET BY MOUTH  DAILY 12/24/19   [name], MD    topiramate (TOPAMAX) 200 MG tablet TAKE 1 TABLET BY MOUTH  DAILY 03/18/20   [name], MD  orlistat (XENICAL) 120 MG capsule Take 1 capsule (120 mg total) by mouth 3 (three) times daily with meals. 05/30/15 02/29/16  [name], MD    Family History Family History  Problem Relation Age of Onset  . Heart attack Mother        Mom 71, Dad 56  . Hypertension Mother   . Heart disease Mother   . Heart disease Father   . Diabetes Father   . Colon cancer Maternal Grandmother        not sure age of  onset  . Esophageal cancer Neg Hx   . Rectal cancer Neg Hx   . Stomach cancer Neg Hx     Social History Social History   Tobacco Use  . Smoking status: Former Smoker    Packs/day: 1.25    Years: 40.00    Pack years: 50.00    Types: Cigarettes    Quit date: 06/07/2008    Years since quitting: 12.2  . Smokeless tobacco: Never Used  Vaping Use  . Vaping Use: Never used  Substance Use Topics  . Alcohol use: Yes    Alcohol/week: 0.0 standard drinks    Comment: VERY RARE  . Drug use: No     Allergies   Latex, Oxycodone, and Zofran [ondansetron hcl]   Review of Systems Review of Systems Pertinent negatives listed in HPI  Physical Exam Triage Vital Signs ED Triage Vitals  Enc Vitals Group     BP 09/20/20 0915 (!) 152/77     Pulse Rate 09/20/20 0915 65     Resp 09/20/20 0915 16     Temp 09/20/20 0915 98.4 F (36.9 C)     Temp Source 09/20/20 0915 Oral     SpO2 09/20/20 0915 99 %     Weight 09/20/20 0926 147 lb 11.3 oz (67 kg)     Height 09/20/20 0926 5\' 2"  (1.575 m)     Head Circumference --      Peak Flow --      Pain Score 09/20/20 0926 3     Pain Loc --      Pain Edu? --      Excl. in [place]? --    No data found.  Updated Vital Signs BP (!) 152/77 (BP Location: Right Arm)   Pulse 65   Temp 98.4 F (36.9 C) (Oral)   Resp 16   Ht 5\' 2"  (1.575 m)   Wt 147 lb 11.3 oz (67 kg)   SpO2 99%   BMI 27.02 kg/m   Visual Acuity Right Eye Distance:   Left Eye  Distance:   Bilateral Distance:    Right Eye Near:   Left Eye Near:    Bilateral Near:     Physical Exam Constitutional:      Appearance: Normal appearance.  Cardiovascular:     Rate and Rhythm: Normal rate and regular rhythm.     Pulses: Normal pulses.     Heart sounds: Normal heart sounds.  Pulmonary:     Effort: Pulmonary effort is normal.     Breath sounds: Normal breath sounds.  Skin:    Capillary Refill: Capillary refill takes less than 2 seconds.  Neurological:     General: No focal deficit present.     Mental Status: She is alert and oriented to person, place, and time.  Psychiatric:        Mood and Affect: Mood normal.        Behavior: Behavior normal.        Thought Content: Thought content normal.        Judgment: Judgment normal.    General appearance: alert, well developed, well nourished,  Head: Normocephalic, without obvious abnormality, atraumatic Respiratory: Respirations even and unlabored, normal respiratory rate Heart: rate and rhythm normal. No gallop or murmurs noted on exam  Extremities: Left thumb laceration No gross deformities Skin: Skin color, texture, turgor normal. No rashes seen  Psych: Appropriate mood and affect. Neurologic: Mental status: Alert, oriented to person, place, and time, thought content appropriate.  UC  Treatments / Results  Labs (all labs ordered are listed, but only abnormal results are displayed) Labs Reviewed - No data to display  EKG   Radiology No results found.  Procedures Laceration Repair  Date/Time: 09/20/2020 10:18 AM Performed by: Scot Jun, FNP Authorized by: Scot Jun, FNP   Consent:    Consent obtained:  Verbal   Risks discussed:  Infection and pain Anesthesia (see MAR for exact dosages):    Anesthesia method:  None Laceration details:    Location:  Finger   Finger location:  L thumb Treatment:    Area cleansed with:  Betadine   Amount of cleaning:  Standard   Irrigation  solution:  Sterile saline Skin repair:    Repair method:  Steri-Strips and tissue adhesive   Number of Steri-Strips:  4 Post-procedure details:    Dressing:  Sterile dressing and non-adherent dressing   Patient tolerance of procedure:  Tolerated well, no immediate complications   (including critical care time)  Medications Ordered in UC Medications - No data to display  Initial Impression / Assessment and Plan / UC Course  I have reviewed the triage vital signs and the nursing notes.  Pertinent labs & imaging results that were available during my care of the patient were reviewed by me and considered in my medical decision making (see chart for details).    Left thumb laceration. Repaired with tissue adhesive and steri strips. No laceration kits in clinic today. Patient advised to return on 3 days for wound recheck. Keep bandage and change bandage daily and when soiled. Monitor for signs of infection. Final Clinical Impressions(s) / UC Diagnoses   Final diagnoses:  Laceration of left thumb without damage to nail, foreign body presence unspecified, initial encounter     Discharge Instructions     Change bandage when soiled leave steri strips in place until follow-up in 3 days.   ED Prescriptions    None     PDMP not reviewed this encounter.   Scot Jun, FNP 09/24/20 [phone]

## 2020-09-20 NOTE — Discharge Instructions (Addendum)
Change bandage when soiled leave steri strips in place until follow-up in 3 days.

## 2020-09-26 DIAGNOSIS — R310 Gross hematuria: Secondary | ICD-10-CM | POA: Diagnosis not present

## 2020-09-26 LAB — BASIC METABOLIC PANEL
BUN: 16 (ref 4–21)
Creatinine: 0.6 (ref 0.5–1.1)

## 2020-09-26 LAB — COMPREHENSIVE METABOLIC PANEL
GFR calc Af Amer: 110.1
GFR calc non Af Amer: 95

## 2020-10-01 DIAGNOSIS — R310 Gross hematuria: Secondary | ICD-10-CM | POA: Diagnosis not present

## 2020-10-01 DIAGNOSIS — N200 Calculus of kidney: Secondary | ICD-10-CM | POA: Diagnosis not present

## 2020-10-08 DIAGNOSIS — N3000 Acute cystitis without hematuria: Secondary | ICD-10-CM | POA: Diagnosis not present

## 2020-10-08 DIAGNOSIS — N3010 Interstitial cystitis (chronic) without hematuria: Secondary | ICD-10-CM | POA: Diagnosis not present

## 2020-10-08 DIAGNOSIS — R310 Gross hematuria: Secondary | ICD-10-CM | POA: Diagnosis not present

## 2020-10-25 DIAGNOSIS — Z8616 Personal history of COVID-19: Secondary | ICD-10-CM

## 2020-10-25 HISTORY — DX: Personal history of COVID-19: Z86.16

## 2020-10-28 ENCOUNTER — Telehealth (INDEPENDENT_AMBULATORY_CARE_PROVIDER_SITE_OTHER): Payer: Medicare Other | Admitting: Family Medicine

## 2020-10-28 DIAGNOSIS — U071 COVID-19: Secondary | ICD-10-CM | POA: Diagnosis not present

## 2020-10-28 MED ORDER — BENZONATATE 100 MG PO CAPS: 100.0000 mg | ORAL_CAPSULE | Freq: Three times a day (TID) | ORAL | 0 refills | Status: DC | PRN

## 2020-10-28 MED ORDER — ALBUTEROL SULFATE HFA 108 (90 BASE) MCG/ACT IN AERS: 2.0000 | INHALATION_SPRAY | Freq: Four times a day (QID) | RESPIRATORY_TRACT | 0 refills | Status: DC | PRN

## 2020-10-28 NOTE — Progress Notes (Signed)
Virtual Visit via Telephone Note  I connected with Alison Brooks on 10/28/20 at  4:00 PM EST by telephone and verified that I am speaking with the correct person using two identifiers.   I discussed the limitations, risks, security and privacy concerns of performing an evaluation and management service by telephone and the availability of in person appointments. I also discussed with the patient that there may be a patient responsible charge related to this service. The patient expressed understanding and agreed to proceed.  Location patient: home, [place] Location provider: work or home office Participants present for the call: patient, provider Patient did not have a visit with me in the prior 7 days to address this/these issue(s).   History of Present Illness:  Acute telemedicine visit for a cough: -Onset: 2 days ago, was around several family members diagnosed with covid -Symptoms include: sore throat, nasal congestion, cough, some wheezing/a little burning in chest when coughing, some mild sob with going up the stairs, o/w no sob - report has had this with cold in the past and had to have inhalers - but does not have her inhaler any longer, headache, low grade fever -O2 > 97% -did have a positive covid test today -Denies:vomiting, inability to eat or drink -Has tried:nothing -Pertinent past medical history:htn, obesity -Pertinent medication allergies:latex, oxycodone, zofran -COVID-19 vaccine status:fully vaccinated and boosted   Observations/Objective: Patient sounds cheerful and well on the phone. I do not appreciate any SOB. Speech and thought processing are grossly intact. Patient reported vitals:  Assessment and Plan:  COVID-19  -we discussed possible serious and likely etiologies, options for evaluation and workup, limitations of telemedicine visit vs in person visit, treatment, treatment risks and precautions. Pt prefers to treat via telemedicine empirically rather than  in person at this moment. Discussed treatment options, potential complications, precautions and isolation. Advised in person evaluation and higher level care given the shortness of breath and chest discomfort, however she feels she has had this in the past with other respiratory illnesses, feels the symptoms are mild at this time and prefers to avoid seeking in person care for the time being. She opted to try albuterol inhaler every 4-6 hours as needed, Tessalon for cough, nasal saline, analgesics if needed. She does agree to seek prompt in person care if worsening or not improving with these measures. She has a pulse ox machine and plans to monitor her oxygen. Work/School slipped offered: provided in patient instructions   Scheduled follow up with PCP offered: Agrees to follow-up if needed Advised to seek prompt in person care if worsening, new symptoms arise, or if is not improving with treatment. Advised of options for inperson care in case PCP office not available.   Follow Up Instructions:  I did not refer this patient for an OV with me in the next 24 hours for this/these issue(s).  I discussed the assessment and treatment plan with the patient. The patient was provided an opportunity to ask questions and all were answered. The patient agreed with the plan and demonstrated an understanding of the instructions.   I spent 18 minutes on the date of this visit in the care of this patient. See summary of tasks completed to properly care for this patient in the detailed notes above which often include previsit review of recent office visit notes, review of PMH, medications, allergies, evaluation of the patient and ordering and instructing patient on testing and care options.     Terressa Koyanagi, DO

## 2020-10-28 NOTE — Patient Instructions (Addendum)
   ---------------------------------------------------------------------------------------------------------------------------      WORK SLIP:  Patient Alison Brooks,  22-Jan-1956, was seen for a medical visit today, 10/28/20 . Please excuse from work according to the [facility] guidelines for a COVID like illness. We advise 10 days minimum from the onset of symptoms (10/25/2020) PLUS 1 day of no fever and improved symptoms. Will defer to employer for a sooner return to work if symptoms resolve, greater than 5 days since positive test and COVID19 antigen testing is negative, but advised wearing a mask at all times.  Sincerely: E-signature: Dr. Kriste Basque, DO [facility] Ph: [phone]   ------------------------------------------------------------------------------------------------------------------------------   HOME CARE TIPS:  Dolores Lory COVID19 testing information: [email or web address] OR [phone] Most pharmacies also offer testing and home test kits.  -I sent the medication(s) we discussed to your pharmacy: Meds ordered this encounter  Medications  . albuterol (PROAIR HFA) 108 (90 Base) MCG/ACT inhaler    Sig: Inhale 2 puffs into the lungs every 6 (six) hours as needed for wheezing or shortness of breath.    Dispense:  1 each    Refill:  0  . benzonatate (TESSALON PERLES) 100 MG capsule    Sig: Take 1 capsule (100 mg total) by mouth 3 (three) times daily as needed.    Dispense:  20 capsule    Refill:  0     -can use tylenol or aleve if needed for fevers, aches and pains per instructions  -can use nasal saline a few times per day if nasal congestion  -stay hydrated, drink plenty of fluids and eat small healthy meals - avoid dairy  -can take 1000 IU Vit D3 and Vit C lozenges per instructions  -check out the CDC website for more information on home care, transmission and treatment for COVID19  -follow up  with your doctor in 2-3 days unless improving and feeling better  -stay home while sick, except to seek medical care, and if you have COVID19 please stay home for a full 10 days since the onset of symptoms PLUS one day of no fever and feeling better.  It was nice to meet you today, and I really hope you are feeling better soon. I help [facility] out with telemedicine visits on Tuesdays and Thursdays and am available for visits on those days. If you have any concerns or questions following this visit please schedule a follow up visit with your Primary Care doctor or seek care at a local urgent care clinic to avoid delays in care.    Seek in person care promptly if your symptoms worsen, new concerns arise or you are not improving with treatment. Call 911 and/or seek emergency care if you symptoms are severe or life threatening.

## 2020-11-02 IMAGING — DX DG CHEST 2V
2 series · 2 of 2 positions shown · non-contrast
Comparison: Chest radiograph 01/31/2015.  CT chest 11/20/2019

CLINICAL DATA: Left back pain for 4 months but worse over the last
2 days.

EXAM:
CHEST - 2 VIEW

[chest pa]
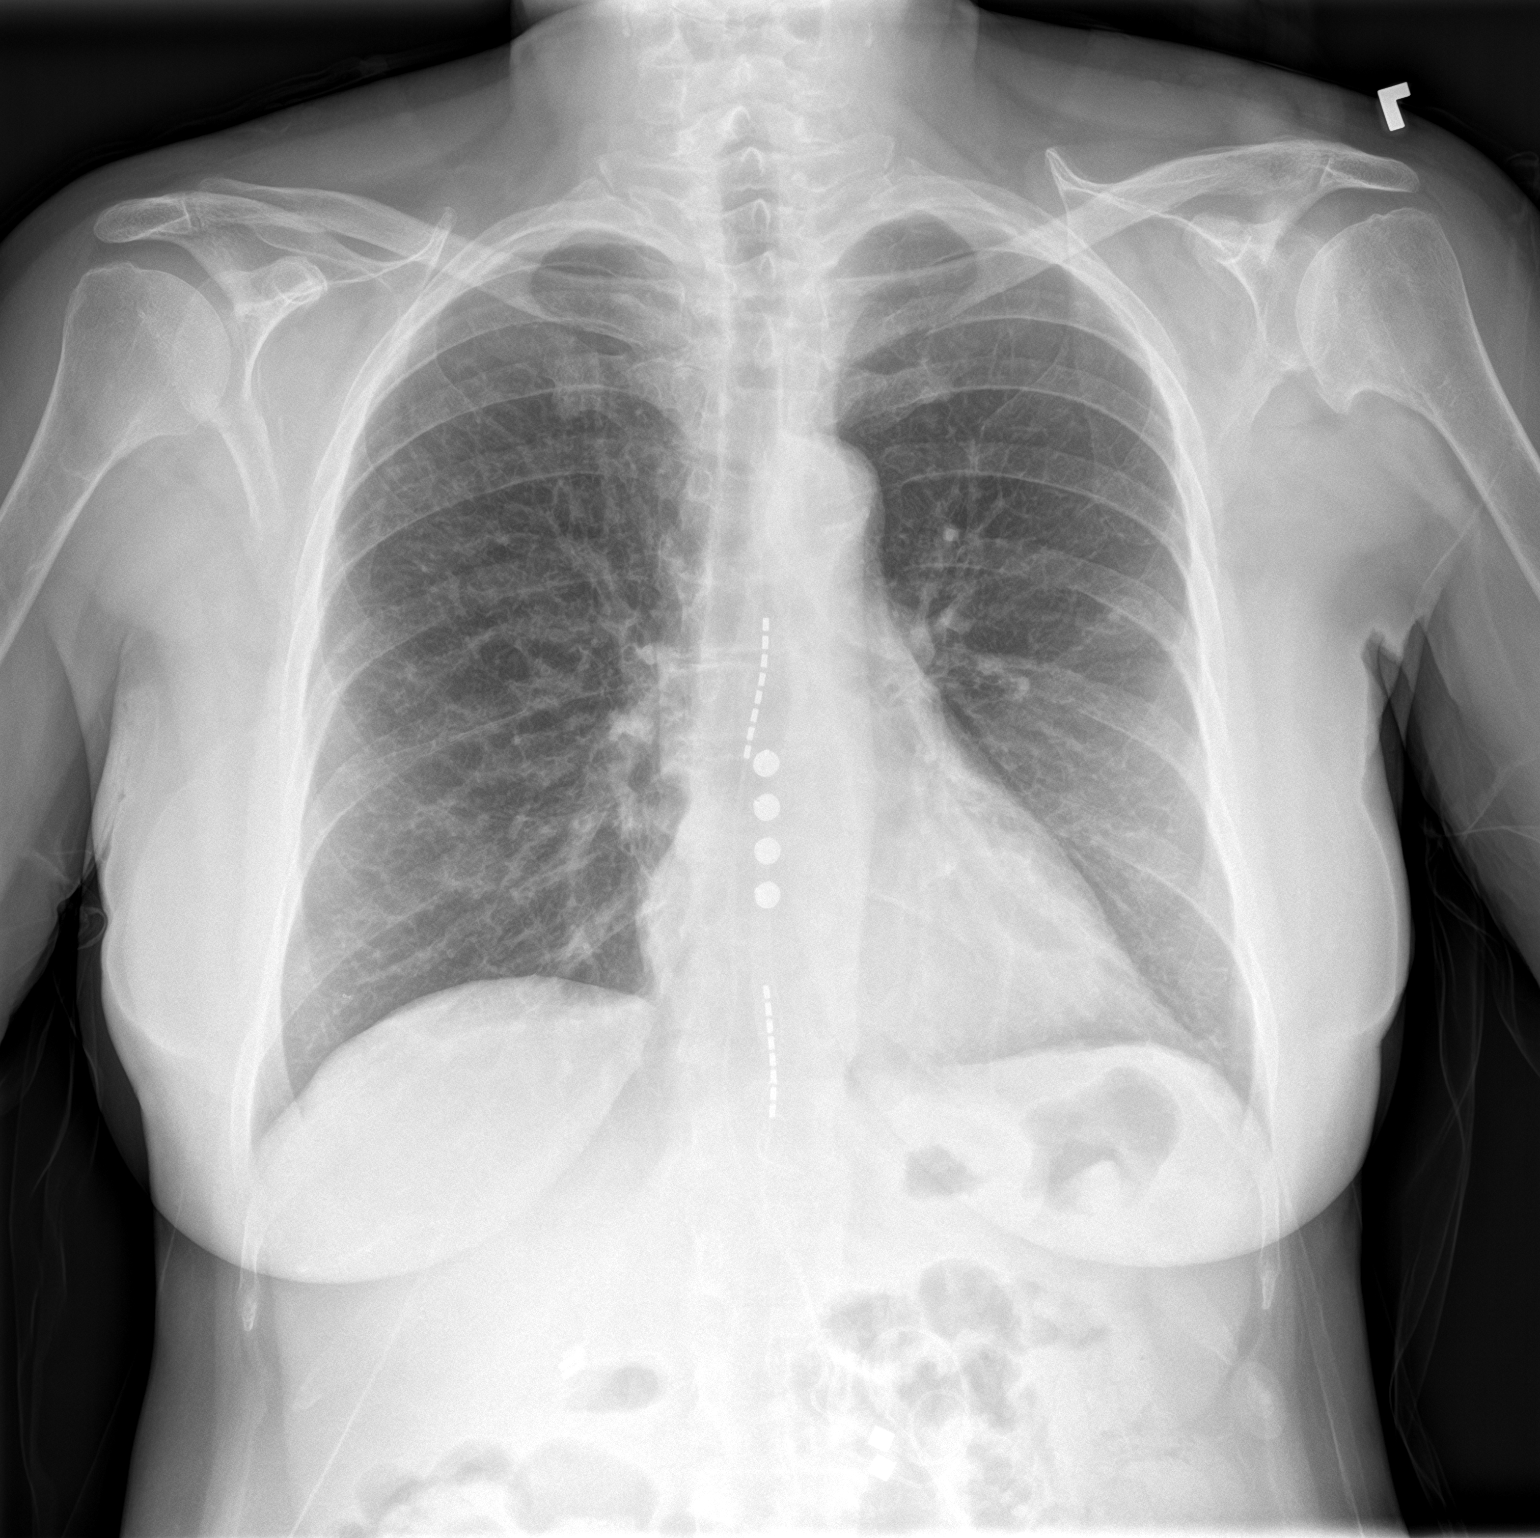

[chest lat]
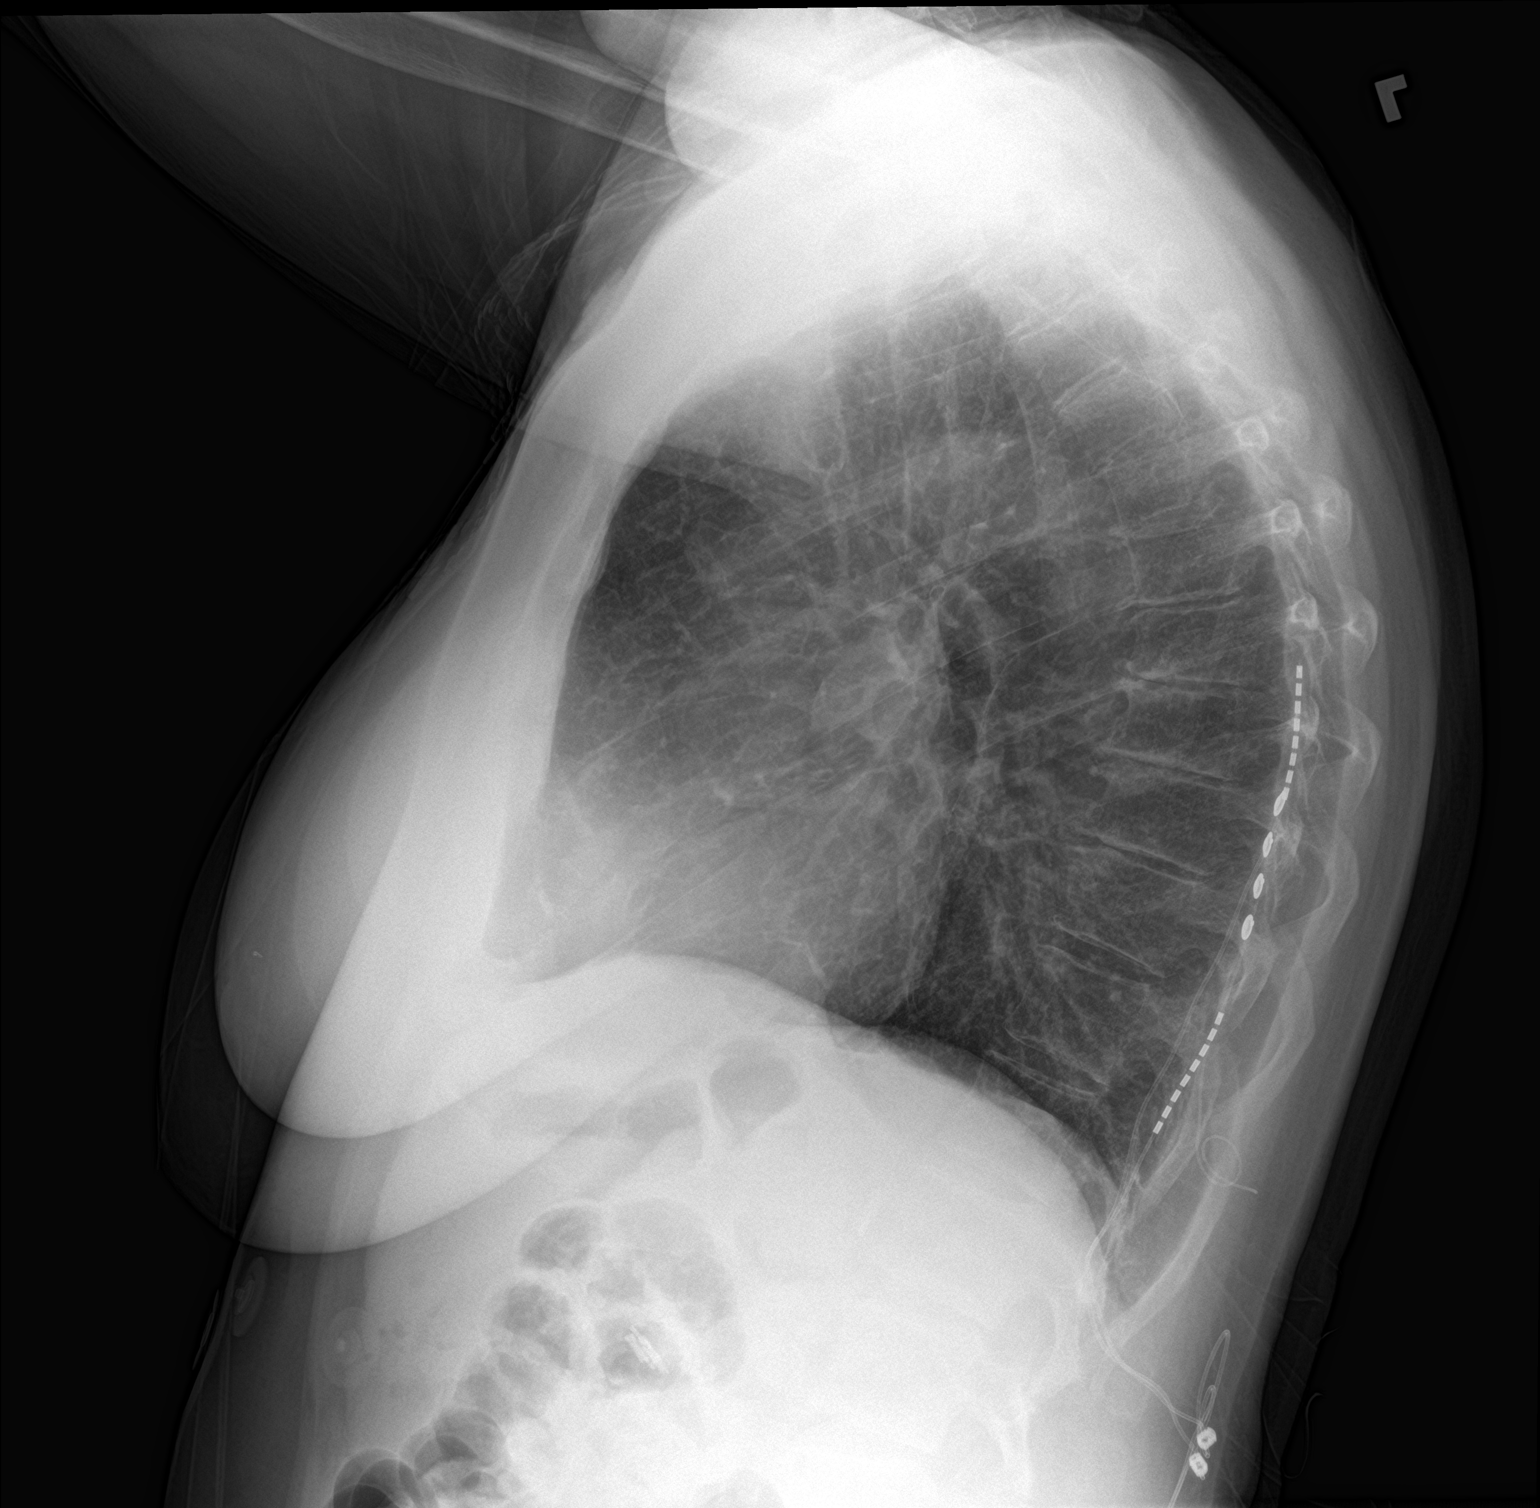

[2 of 2 positions shown; findings below may reference images not displayed]

FINDINGS: Normal heart size and pulmonary vascularity. The lungs are clear and
expanded. No pleural effusions. No pneumothorax. Mediastinal
contours appear intact. Calcification of the aorta. Surgical clips
in the right upper quadrant. Spinal stimulator leads over the mid
and lower thoracic spine. Degenerative changes in the spine.
IMPRESSION: No active cardiopulmonary disease.

## 2020-11-04 ENCOUNTER — Other Ambulatory Visit: Payer: Medicare Other

## 2020-11-07 ENCOUNTER — Telehealth: Payer: Self-pay

## 2020-11-07 DIAGNOSIS — U071 COVID-19: Secondary | ICD-10-CM

## 2020-11-07 DIAGNOSIS — R059 Cough, unspecified: Secondary | ICD-10-CM

## 2020-11-07 NOTE — Telephone Encounter (Signed)
Patient tested positive 1/4 and is still continuing to be worsen. Had visit with dr.kim on 1/4 who mentioned a chest xray and at that point patient did not want to get it but now patient is having discomfort in chest and thinks it is turning into bronchitis or pneumonia and would like that ordered for her to do.

## 2020-11-10 NOTE — Telephone Encounter (Signed)
Dr. Yong Channel, pt would like a chest x-ray ordered. Please see message.

## 2020-11-10 NOTE — Telephone Encounter (Signed)
Spoke to pt told her calling in regards to her message few days ago. Told her office is closed today I would suggest you go to an Urgent care to be evalauated and have Chest x-ray done. Pt verbalized understanding and said she can not get out today due to snow. Asked her how her cough is? Pt said she is having a dry non-productive cough, hurts when she coughs and burns like when she gets Bronchitis. Asked pt if she is taking Mucinex DM? Pt said yes and Tessalon Pearles. Told pt okay I will send message to Dr. Yong Channel about getting chest x-ray and someone will get back to you tomorrow. Pt verbalized understanding.

## 2020-11-11 NOTE — Telephone Encounter (Signed)
Can order chest x-ray under covid and cough (2 view)  With worsening symptoms/lack of improvement this cannot be done either here or at [facility] (we would have to shut the area down afterwards). Pt can go over to [facility] in [place] Entrance to get x-ray. Doors close at 5 for walk in patients. OR you can try to get her a visit with respiratory clinic

## 2020-11-11 NOTE — Telephone Encounter (Signed)
Chest xray ordered, called and made pt aware.

## 2020-11-12 ENCOUNTER — Ambulatory Visit (HOSPITAL_COMMUNITY)
Admission: RE | Admit: 2020-11-12 | Discharge: 2020-11-12 | Disposition: A | Discharge status: Home / Self Care | Payer: Medicare Other | Source: Ambulatory Visit | Attending: Family Medicine | Admitting: Family Medicine

## 2020-11-12 ENCOUNTER — Other Ambulatory Visit: Payer: Self-pay

## 2020-11-12 DIAGNOSIS — R059 Cough, unspecified: Secondary | ICD-10-CM | POA: Insufficient documentation

## 2020-11-12 DIAGNOSIS — U071 COVID-19: Secondary | ICD-10-CM | POA: Insufficient documentation

## 2020-11-13 ENCOUNTER — Other Ambulatory Visit: Payer: Self-pay | Admitting: *Deleted

## 2020-11-13 ENCOUNTER — Other Ambulatory Visit: Payer: Self-pay | Admitting: Acute Care

## 2020-11-13 DIAGNOSIS — Z720 Tobacco use: Secondary | ICD-10-CM

## 2020-11-13 DIAGNOSIS — Z87891 Personal history of nicotine dependence: Secondary | ICD-10-CM

## 2020-11-18 ENCOUNTER — Telehealth (INDEPENDENT_AMBULATORY_CARE_PROVIDER_SITE_OTHER): Payer: Medicare Other | Admitting: Family Medicine

## 2020-11-18 DIAGNOSIS — R197 Diarrhea, unspecified: Secondary | ICD-10-CM | POA: Diagnosis not present

## 2020-11-18 DIAGNOSIS — R5383 Other fatigue: Secondary | ICD-10-CM

## 2020-11-18 DIAGNOSIS — R059 Cough, unspecified: Secondary | ICD-10-CM | POA: Diagnosis not present

## 2020-11-18 NOTE — Progress Notes (Signed)
Patient is scheduled for Monday.

## 2020-11-18 NOTE — Progress Notes (Signed)
Virtual Visit via Telephone Note  I connected with Alison Brooks on 11/18/20 at 11:20 AM EST by telephone and verified that I am speaking with the correct person using two identifiers.   I discussed the limitations, risks, security and privacy concerns of performing an evaluation and management service by telephone and the availability of in person appointments. I also discussed with the patient that there may be a patient responsible charge related to this service. The patient expressed understanding and agreed to proceed.  Location patient: home, [place] Location provider: work or home office Participants present for the call: patient, provider Patient did not have a visit with me in the prior 7 days to address this/these issue(s).   History of Present Illness:  Acute telemedicine visit for cough: -Onset: 10/26/2020 with COVID19 illness,  -Symptoms include: many symptoms have resolved but has persistent cough, drainage and fatigue, some diarrhea intermittently -Denies:fevers, NVD, CP, SOB, thick phlegm, sinus pain or discomfort, persistent wheezing -Has tried: nothing -Pertinent past medical history:htn, obesity -Pertinent medication allergies:latex, oxycodone, zofran -COVID-19 vaccine status: fully vaccinated and bosted   Observations/Objective: Patient sounds cheerful and well on the phone. I do not appreciate any SOB. Speech and thought processing are grossly intact. Patient reported vitals:  Assessment and Plan:  Cough  Fatigue, unspecified type  Diarrhea, unspecified type  -we discussed possible serious and likely etiologies, options for evaluation and workup, limitations of telemedicine visit vs in person visit, treatment, treatment risks and precautions. Pt prefers to treat via telemedicine empirically rather than in person at this moment. Query post covid symptoms vs other. Opted for trial imodium and dairy free diet, tessalon for cough if needed (she still has some of  this), Vit C and D and close follow up with PCP as would need exam and possibly labs/xray if symptoms persist. Scheduled follow up with PCP offered:  Sent message to schedulers to assist and advised patient to contact PCP office to schedule if does not receive call back in next 24 hours. Advised to seek prompt in person care if worsening, new symptoms arise, or if is not improving with treatment. Advised of options for inperson care in case PCP office not available. Did let the patient know that I only do telemedicine shifts for [facility] on Tuesdays and Thursdays and advised a follow up visit with PCP or at an [facility] if has further questions or concerns.   Follow Up Instructions:  I did not refer this patient for an OV with me in the next 24 hours for this/these issue(s).  I discussed the assessment and treatment plan with the patient. The patient was provided an opportunity to ask questions and all were answered. The patient agreed with the plan and demonstrated an understanding of the instructions.   I spent 14 minutes on the date of this visit in the care of this patient. See summary of tasks completed to properly care for this patient in the detailed notes above which often include previsit review of recent office visit notes, review of PMH, medications, allergies, evaluation of the patient and ordering and instructing patient on testing and care options.     Lucretia Kern, DO

## 2020-11-18 NOTE — Patient Instructions (Signed)
-  try imodium if any further diarrhea  -no dairy for about 2 weeks  -can use the tessalon if needed  -ease into regular activities and eat a healthy diet  -vit D 1000 IU (66mcg) daily and Vit C 100-500mg  daily  -follow up with your primary care doctor (preferably in person if possible) in about 5-7 days, sooner if worsening.

## 2020-11-24 ENCOUNTER — Encounter: Payer: Self-pay | Admitting: Family Medicine

## 2020-11-24 ENCOUNTER — Ambulatory Visit (INDEPENDENT_AMBULATORY_CARE_PROVIDER_SITE_OTHER): Payer: Medicare Other | Admitting: Family Medicine

## 2020-11-24 ENCOUNTER — Other Ambulatory Visit: Payer: Self-pay

## 2020-11-24 VITALS — BP 144/90 | HR 61 | Temp 98.2°F | Ht 62.0 in | Wt 152.0 lb

## 2020-11-24 DIAGNOSIS — E538 Deficiency of other specified B group vitamins: Secondary | ICD-10-CM

## 2020-11-24 DIAGNOSIS — J441 Chronic obstructive pulmonary disease with (acute) exacerbation: Secondary | ICD-10-CM

## 2020-11-24 DIAGNOSIS — I10 Essential (primary) hypertension: Secondary | ICD-10-CM

## 2020-11-24 DIAGNOSIS — J449 Chronic obstructive pulmonary disease, unspecified: Secondary | ICD-10-CM | POA: Insufficient documentation

## 2020-11-24 MED ORDER — PREDNISONE 20 MG PO TABS: ORAL_TABLET | ORAL | 0 refills | Status: DC

## 2020-11-24 MED ORDER — CYANOCOBALAMIN 1000 MCG/ML IJ SOLN: INTRAMUSCULAR | 1 refills | Status: DC

## 2020-11-24 MED ORDER — AZITHROMYCIN 250 MG PO TABS: ORAL_TABLET | ORAL | 0 refills | Status: DC

## 2020-11-24 NOTE — Progress Notes (Signed)
Phone [phone] In person visit   Subjective:   Alison Brooks is a 65 y.o. year old very pleasant female patient who presents for/with See problem oriented charting Chief Complaint  Patient presents with  . Fatigue  . Cough  . Hoarse   This visit occurred during the SARS-CoV-2 public health emergency.  Safety protocols were in place, including screening questions prior to the visit, additional usage of staff PPE, and extensive cleaning of exam room while observing appropriate contact time as indicated for disinfecting solutions.   Past Medical History-  Patient Active Problem List   Diagnosis Date Noted  . 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 Gout 06/28/2014    Priority: Medium  . Vitamin B12 deficiency 06/28/2014    Priority: Medium  . Multiple pulmonary nodules 06/28/2014    Priority: Medium  . Essential hypertension 05/17/2014    Priority: Medium  . RESTLESS LEG SYNDROME, SEVERE 03/14/2009    Priority: Medium  . Chronic interstitial cystitis 01/20/2009    Priority: Medium  . Depression, major, recurrent ([place]) 03/19/2008    Priority: Medium  . Hyperlipidemia 02/17/2008    Priority: Medium  . Former smoker 12/21/2007    Priority: Medium  . FIBROMYALGIA 04/21/2007    Priority: Medium  . Obesity (BMI 30-39.9) 01/14/2014    Priority: Low  . Lateral epicondylitis (tennis elbow) 01/02/2014    Priority: Low  . GERD (gastroesophageal reflux disease) 03/26/2013    Priority: Low  . LEG CRAMPS 05/16/2009    Priority: Low  . TUBULOVILLOUS ADENOMA, COLON 02/17/2008    Priority: Low  . NEPHROLITHIASIS 02/17/2008    Priority: Low  . MIGRAINE, CLASSICAL W/O INTRACTABLE MIGRAINE 06/09/2007    Priority: Low  . SYMPTOM, MEMORY LOSS 06/09/2007    Priority: Low  . COPD (chronic obstructive pulmonary disease) ([place]) 11/24/2020  . History of total hysterectomy 07/17/2019  . Hypothyroidism 07/17/2019  . Vertigo  11/06/2016  . Dyspnea 11/02/2014  . Upper airway cough syndrome 11/02/2014    Medications- reviewed and updated Current Outpatient Medications  Medication Sig Dispense Refill  . albuterol (PROAIR HFA) 108 (90 Base) MCG/ACT inhaler Inhale 2 puffs into the lungs every 6 (six) hours as needed for wheezing or shortness of breath. 1 each 0  . allopurinol (ZYLOPRIM) 100 MG tablet TAKE 1 TABLET BY MOUTH  DAILY 90 tablet 3  . amLODipine (NORVASC) 2.5 MG tablet Take 1 tablet (2.5 mg total) by mouth daily. 90 tablet 3  . azithromycin (ZITHROMAX) 250 MG tablet Take 2 tabs on day 1, then 1 tab daily until finished 6 tablet 0  . baclofen (LIORESAL) 10 MG tablet TAKE 1/2 TO 1 TABLET BY  MOUTH 3 TIMES DAILY 180 tablet 0  . benzonatate (TESSALON PERLES) 100 MG capsule Take 1 capsule (100 mg total) by mouth 3 (three) times daily as needed. 20 capsule 0  . Dextromethorphan-guaiFENesin 10-100 MG/5ML liquid Take 5 mLs by mouth every 12 (twelve) hours.    . diclofenac sodium (VOLTAREN) 1 % GEL Apply 4 g topically 4 (four) times daily. (Patient taking differently: Apply 4 g topically as needed.) 100 g 1  . ELDERBERRY PO Take by mouth.    . escitalopram (LEXAPRO) 20 MG tablet TAKE 1 TABLET BY MOUTH  DAILY 90 tablet 3  . ezetimibe (ZETIA) 10 MG tablet TAKE 1 TABLET BY MOUTH  DAILY 90 tablet 3  . famotidine (PEPCID) 20 MG tablet Take 1  tablet (20 mg total) by mouth at bedtime. 30 tablet 6  . ibuprofen (ADVIL) 800 MG tablet Take 1 tablet (800 mg total) by mouth 3 (three) times daily. 30 tablet 0  . levothyroxine (SYNTHROID, LEVOTHROID) 50 MCG tablet     . oxybutynin (DITROPAN) 5 MG tablet TAKE 1 TABLET BY MOUTH 2  TIMES DAILY AS NEEDED FOR  BLADDER SPASMS. 180 tablet 3  . pantoprazole (PROTONIX) 40 MG tablet TAKE 1 TABLET BY MOUTH  TWICE DAILY 30 TO 60  MINUTES BEFORE MEALS 180 tablet 1  . predniSONE (DELTASONE) 20 MG tablet Take 2 pills for 3 days, 1 pill for 4 days 10 tablet 0  . rosuvastatin (CRESTOR) 40 MG tablet  TAKE 1 TABLET BY MOUTH  DAILY 90 tablet 3  . topiramate (TOPAMAX) 200 MG tablet TAKE 1 TABLET BY MOUTH  DAILY 90 tablet 3  . cyanocobalamin (,VITAMIN B-12,) 1000 MCG/ML injection 1000 mcg (1 mg) injection once per per month or as directed 30 mL 1   No current facility-administered medications for this visit.     Objective:  BP (!) 144/90   Pulse 61   Temp 98.2 F (36.8 C) (Temporal)   Ht 5\' 2"  (1.575 m)   Wt 152 lb (68.9 kg)   SpO2 97%   BMI 27.80 kg/m  Gen: NAD, resting comfortably CV: RRR no murmurs rubs or gallops Lungs: CTAB no crackles, wheeze, rhonchi (but had recently used albuterol) Abdomen: soft/nontender/nondistended/normal bowel sounds. No rebound or guarding.  Ext: no edema or calf pain  Skin: warm, dry    Assessment and Plan  Persistent Fatigue/cough after covid in patient with COPD S:patient diagnosed with covid 19 early in January with first symptoms 10/26/20- started with severe sore throat. Has persistent cough, drainage and fatigue with some intermittent diarrhea noted on 11/18/20 visit with Dr. Maudie Mercury . Was given tessalon for cough, recommended vitamin C and D and close follow up with me.   Chest x-ray on 11/12/20 with no evidence of pneumonia.   Patient states persistent hacking cough and has hoarseness from coughing. Shortness of breath with activity better with albuterol. Congestion feels better. Can feel fine one minute and then feel wore the next. 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 She may get up to get going and get exhaused one time and the next time be fine. Diarrhea off and on. Overall improving though. Could probably sleep 10-12 hours but could get up and sleep more .   Quit smoking 2009 with 50 pack years. Wheezing intermittently but not regularly.tessalon takes edge off. robitussen Dm helps some.  She has noted foul smelling breath - does not feel liek can get anything up from sinuses or lungs- feels stopped  up.  A/P: 65 year old female with Cough/fatigue/wheezing a month out from first covid symptom- I am concerned could have copd exacerbation. She had reassuring chest x-ray recently within 2 weeks. We opted to trial combo of prednisone plus azithromycin. With foul smelling breath could have bacterial sinus infection- azithromycin should give some coverage for this though not perfect.  If she has new or worsening symptoms she should let us know ASAP or if fails to improve within 10 days.  -this far out from initial illness- I do not think covid repeat testing would be helpful  # B12 deficiency S: Current treatment/medication (oral vs. IM): injections monthly  Lab Results  Component Value Date   VITAMINB12 191 (  L) 07/17/2019  A/P: next time we do labs will need b12 level   #bp hair high today but doesn't feel well- 120s at home- I honestly think she will feel better on prednisone and will bring this down. Continue amlodipine 2.5 mg for now   Recommended follow up: schedule next available physical Future Appointments  Date Time Provider [place]  06/11/2021 10:15 AM [facility] HEALTH COACH [facility]    Lab/Order associations:   ICD-10-CM   1. Breast cancer screening by mammogram  Z12.31   2. Osteoporosis, unspecified osteoporosis type, unspecified pathological fracture presence  M81.0   3. Chronic obstructive pulmonary disease with acute exacerbation (HCC)  J44.1   4. Vitamin B12 deficiency  E53.8 cyanocobalamin (,VITAMIN B-12,) 1000 MCG/ML injection    Meds ordered this encounter  Medications  . azithromycin (ZITHROMAX) 250 MG tablet    Sig: Take 2 tabs on day 1, then 1 tab daily until finished    Dispense:  6 tablet    Refill:  0  . predniSONE (DELTASONE) 20 MG tablet    Sig: Take 2 pills for 3 days, 1 pill for 4 days    Dispense:  10 tablet    Refill:  0  . cyanocobalamin (,VITAMIN B-12,) 1000 MCG/ML injection    Sig: 1000 mcg (1 mg) injection once per per month or as  directed    Dispense:  30 mL    Refill:  1   Return precautions advised.  Garret Reddish, MD

## 2020-11-24 NOTE — Patient Instructions (Addendum)
  Health Maintenance Due  Topic Date Due  . MAMMOGRAM Pt will schedule when feeling better 07/27/2018  . DEXA SCAN order at physical  Never done  . INFLUENZA VACCINE Not today.  05/25/2020  . PNA vac Low Risk Adult (2 of 2 - PCV13) Not today. When feeling better 07/16/2020   65 year old female with Cough/fatigue/wheezing a month out from first covid symptom- I am concerned could have copd exacerbation. She had reassuring chest x-ray recently within 2 weeks. We opted to trial combo of prednisone plus azithromycin. With foul smelling breath could have bacterial sinus infection- azithromycin should give some coverage for this though not perfect.  If she has new or worsening symptoms she should let us know ASAP or if fails to improve within 10 days.  -this far out from initial illness- I do not think covid repeat testing would be helpful   Recommended follow up: schedule next available physical or see [place] sooner if needed

## 2020-12-08 ENCOUNTER — Other Ambulatory Visit: Payer: Self-pay | Admitting: Family Medicine

## 2020-12-31 NOTE — Progress Notes (Signed)
Phone [phone] Virtual visit via phonenote   Subjective:   Chief Complaint  Patient presents with  . Shortness of Breath    Covid pos 10/28/2020 Still with Sx   . Cough    Dry    This visit type was conducted due to national recommendations for restrictions regarding the COVID-19 Pandemic (e.g. social distancing).  This format is felt to be most appropriate for this patient at this time balancing risks to patient and risks to population by having him in for in person visit.  All issues noted in this document were discussed and addressed.  No physical exam was performed (except for noted visual exam or audio findings with Telehealth visits).  The patient has consented to conduct a Telehealth visit and understands insurance will be billed.   Our team/I connected with [name] at  9:40 AM EST by phone (patient did not have equipment for webex) and verified that I am speaking with the correct person using two identifiers.  Location patient: [facility] Location provider: [facility], office Persons participating in the virtual visit:  patient  Time on phone: 12 minutes  Our team/I discussed the limitations of evaluation and management by telemedicine and the availability of in person appointments. In light of current covid-19 pandemic, patient also understands that we are trying to protect them by minimizing in office contact if at all possible.  The patient expressed consent for telemedicine visit and agreed to proceed. Patient understands insurance will be billed.   Past Medical History-  Patient Active Problem List   Diagnosis Date Noted  . COPD (chronic obstructive pulmonary disease) ([place]) 11/24/2020    Priority: High  . 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 Gout 06/28/2014    Priority: Medium  . Vitamin B12 deficiency 06/28/2014    Priority: Medium  . Multiple pulmonary nodules 06/28/2014    Priority: Medium  .  Essential hypertension 05/17/2014    Priority: Medium  . RESTLESS LEG SYNDROME, SEVERE 03/14/2009    Priority: Medium  . Chronic interstitial cystitis 01/20/2009    Priority: Medium  . Depression, major, recurrent ([place]) 03/19/2008    Priority: Medium  . Hyperlipidemia 02/17/2008    Priority: Medium  . Former smoker 12/21/2007    Priority: Medium  . FIBROMYALGIA 04/21/2007    Priority: Medium  . Obesity (BMI 30-39.9) 01/14/2014    Priority: Low  . Lateral epicondylitis (tennis elbow) 01/02/2014    Priority: Low  . GERD (gastroesophageal reflux disease) 03/26/2013    Priority: Low  . LEG CRAMPS 05/16/2009    Priority: Low  . TUBULOVILLOUS ADENOMA, COLON 02/17/2008    Priority: Low  . NEPHROLITHIASIS 02/17/2008    Priority: Low  . MIGRAINE, CLASSICAL W/O INTRACTABLE MIGRAINE 06/09/2007    Priority: Low  . SYMPTOM, MEMORY LOSS 06/09/2007    Priority: Low  . History of total hysterectomy 07/17/2019  . Hypothyroidism 07/17/2019  . Vertigo 11/06/2016  . Dyspnea 11/02/2014  . Upper airway cough syndrome 11/02/2014    Medications- reviewed and updated Current Outpatient Medications  Medication Sig Dispense Refill  . albuterol (PROAIR HFA) 108 (90 Base) MCG/ACT inhaler Inhale 2 puffs into the lungs every 6 (six) hours as needed for wheezing or shortness of breath. 1 each 0  . allopurinol (ZYLOPRIM) 100 MG tablet TAKE 1 TABLET BY MOUTH  DAILY 90 tablet 3  . amLODipine (NORVASC) 2.5 MG tablet Take 1 tablet (2.5 mg total)  by mouth daily. 90 tablet 3  . baclofen (LIORESAL) 10 MG tablet TAKE 1/2 TO 1 TABLET BY  MOUTH 3 TIMES DAILY 180 tablet 0  . cyanocobalamin (,VITAMIN B-12,) 1000 MCG/ML injection 1000 mcg (1 mg) injection once per per month or as directed 30 mL 1  . diclofenac sodium (VOLTAREN) 1 % GEL Apply 4 g topically 4 (four) times daily. (Patient taking differently: Apply 4 g topically as needed.) 100 g 1  . ELDERBERRY PO Take by mouth.    . escitalopram (LEXAPRO) 20 MG  tablet TAKE 1 TABLET BY MOUTH  DAILY 90 tablet 3  . ezetimibe (ZETIA) 10 MG tablet TAKE 1 TABLET BY MOUTH  DAILY 90 tablet 3  . famotidine (PEPCID) 20 MG tablet Take 1 tablet (20 mg total) by mouth at bedtime. 30 tablet 6  . ibuprofen (ADVIL) 800 MG tablet Take 1 tablet (800 mg total) by mouth 3 (three) times daily. 30 tablet 0  . levothyroxine (SYNTHROID, LEVOTHROID) 50 MCG tablet     . oxybutynin (DITROPAN) 5 MG tablet TAKE 1 TABLET BY MOUTH 2  TIMES DAILY AS NEEDED FOR  BLADDER SPASMS. 180 tablet 3  . pantoprazole (PROTONIX) 40 MG tablet TAKE 1 TABLET BY MOUTH  TWICE DAILY 30 TO 60  MINUTES BEFORE MEALS 180 tablet 1  . rosuvastatin (CRESTOR) 40 MG tablet TAKE 1 TABLET BY MOUTH  DAILY 90 tablet 3  . Tiotropium Bromide Monohydrate (SPIRIVA RESPIMAT) 2.5 MCG/ACT AERS Inhale 2 puffs into the lungs daily. 4 g 5  . topiramate (TOPAMAX) 200 MG tablet TAKE 1 TABLET BY MOUTH  DAILY 90 tablet 3  . benzonatate (TESSALON PERLES) 100 MG capsule Take 1 capsule (100 mg total) by mouth 2 (two) times daily as needed. 30 capsule 0   No current facility-administered medications for this visit.     Objective:  Temp 98.6 F (37 C) (Temporal)   Ht 5\' 2"  (1.575 m)   Wt 158 lb (71.7 kg)   BMI 28.90 kg/m  self reported vitals  Nonlabored voice, normal speech      Assessment and Plan    Prolonged Covid Symptoms S: Last visit patient complained of cough/fatigue/wheezing a month out from first Covid symptoms.  We were concerned she could have a COPD exacerbation.  She had a reassuring chest x-ray within 2 weeks of prior visit.  We opted to trial a combination of prednisone and azithromycin.  She had some foul-smelling breath which could be bacterial sinus infection so we thought the azithromycin would offer some coverage.  We discussed if not improving to follow-up.  Due to lack of complete improvement that is why she follows up today.  Today she reports felt perhaps 50% better with prednisone/azithromycin,  ongoing cough, hoarseness. She still has on and off windedness but not as persistent. Does not tolerate a mask at all. Brain fogginess has been very bothersome. Some shakiness and anxiety. Does not feel overall sick- does not have fevers. Using albuterol 4-5x a week.  A/P: 65 year old female with what sounds to be long COVID.  She is having ongoing cough/shortness of breath though it can be intermittent.  We did refill her Tessalon as that has been helpful for the cough.  She is having use her albuterol 4 times a week-we opted to add Spiriva.  She did have a chest x-ray early in the illness and we discussed potentially repeating but she would like to see how she does with medication first-if she does not improve within  a few weeks she can reach out and I can order an x-ray.  Otherwise we have follow-up scheduled in June and we can check back in at that time  Recommended follow up:  Keep June follow up or sooner if needed Future Appointments  Date Time Provider [place]  03/31/2021  1:20 PM [name], MD [facility]  06/11/2021 10:15 AM [facility] COACH [facility]    Lab/Order associations: No diagnosis found.  Meds ordered this encounter  Medications  . Tiotropium Bromide Monohydrate (SPIRIVA RESPIMAT) 2.5 MCG/ACT AERS    Sig: Inhale 2 puffs into the lungs daily.    Dispense:  4 g    Refill:  5  . benzonatate (TESSALON PERLES) 100 MG capsule    Sig: Take 1 capsule (100 mg total) by mouth 2 (two) times daily as needed.    Dispense:  30 capsule    Refill:  0    Return precautions advised.  Garret Reddish, MD

## 2020-12-31 NOTE — Patient Instructions (Signed)
Health Maintenance Due  Topic Date Due  . MAMMOGRAM  07/27/2018  . DEXA SCAN  Never done  . PNA vac Low Risk Adult (2 of 2 - PCV13) 07/16/2020   Depression screen [facility] 2/9 11/24/2020 06/05/2020 05/23/2020  Decreased Interest 0 0 0  Down, Depressed, Hopeless 0 0 0  PHQ - 2 Score 0 0 0  Altered sleeping 0 - 0  Tired, decreased energy 3 - 0  Change in appetite 0 - 0  Feeling bad or failure about yourself  0 - 0  Trouble concentrating 0 - 0  Moving slowly or fidgety/restless 0 - 0  Suicidal thoughts 0 - 0  PHQ-9 Score 3 - 0  Difficult doing work/chores Somewhat difficult - Not difficult at all  Some recent data might be hidden

## 2021-01-01 ENCOUNTER — Encounter: Payer: Self-pay | Admitting: Family Medicine

## 2021-01-01 ENCOUNTER — Telehealth (INDEPENDENT_AMBULATORY_CARE_PROVIDER_SITE_OTHER): Payer: Medicare Other | Admitting: Family Medicine

## 2021-01-01 VITALS — Temp 98.6°F | Ht 62.0 in | Wt 158.0 lb

## 2021-01-01 DIAGNOSIS — J449 Chronic obstructive pulmonary disease, unspecified: Secondary | ICD-10-CM | POA: Diagnosis not present

## 2021-01-01 MED ORDER — BENZONATATE 100 MG PO CAPS
100.0000 mg | ORAL_CAPSULE | Freq: Two times a day (BID) | ORAL | 0 refills | Status: DC | PRN
Start: 2021-01-01 — End: 2021-03-31

## 2021-01-01 MED ORDER — SPIRIVA RESPIMAT 2.5 MCG/ACT IN AERS: 2.0000 | INHALATION_SPRAY | Freq: Every day | RESPIRATORY_TRACT | 5 refills | Status: DC

## 2021-01-19 ENCOUNTER — Telehealth: Payer: Self-pay | Admitting: Internal Medicine

## 2021-01-19 NOTE — Telephone Encounter (Signed)
Pt reports she has a painful hemorrhoid that started internal and now is external. She is doing sitz bathes, supp, prep h and still hurting. Has a doughnut pillow she is also using. Reports years ago she had a thrombosed hem and this is not quite as bad but there is a hard place she can feel internal and states the pain she is having is rectal and goes up into the groin/vagina area. Discussed with pt that is recticare available otc. Scheduled pt to see Ellouise Newer PA tomorrow at 11am. Pt aware of appt.

## 2021-01-19 NOTE — Telephone Encounter (Signed)
Patient called states she is suffering with pain from external hemorrhoid said she has done everything from suppositories to preporation H etc.

## 2021-01-20 ENCOUNTER — Ambulatory Visit: Payer: Medicare Other | Admitting: Physician Assistant

## 2021-01-26 ENCOUNTER — Other Ambulatory Visit: Payer: Self-pay

## 2021-01-26 ENCOUNTER — Ambulatory Visit (INDEPENDENT_AMBULATORY_CARE_PROVIDER_SITE_OTHER): Payer: Medicare Other | Admitting: Nurse Practitioner

## 2021-01-26 ENCOUNTER — Encounter: Payer: Self-pay | Admitting: Nurse Practitioner

## 2021-01-26 VITALS — BP 138/64 | HR 70 | Ht 62.0 in | Wt 160.0 lb

## 2021-01-26 DIAGNOSIS — K645 Perianal venous thrombosis: Secondary | ICD-10-CM

## 2021-01-26 DIAGNOSIS — Z86010 Personal history of colonic polyps: Secondary | ICD-10-CM | POA: Diagnosis not present

## 2021-01-26 MED ORDER — SUPREP BOWEL PREP KIT 17.5-3.13-1.6 GM/177ML PO SOLN: 1.0000 | ORAL | 0 refills | Status: DC

## 2021-01-26 MED ORDER — HYDROCORTISONE (PERIANAL) 2.5 % EX CREA: 1.0000 "application " | TOPICAL_CREAM | Freq: Two times a day (BID) | CUTANEOUS | 0 refills | Status: AC

## 2021-01-26 NOTE — Patient Instructions (Addendum)
If you are age 67 or older, your body mass index should be between 23-30. Your Body mass index is 29.26 kg/m. If this is out of the aforementioned range listed, please consider follow up with your Primary Care Provider.  PROCEDURES: You have been scheduled for a colonoscopy. Please follow the written instructions given to you at your visit today. Please pick up your prep supplies at the pharmacy within the next 1-3 days. If you use inhalers (even only as needed), please bring them with you on the day of your procedure.  MEDICATION: We have sent the following medication to your pharmacy for you to pick up at your convenience: Anusol cream, use twice a day for 14 days.  OVER THE COUNTER MEDICATION Please purchase the following medications over the counter and take as directed:  Miralax- Dissolve one capful in 8 ounces of water and drink before bed.  Avoid straining.   It was great seeing you today! Thank you for entrusting me with your care and choosing [facility].  Tye Savoy, NP

## 2021-01-26 NOTE — Progress Notes (Signed)
ASSESSMENT AND PLAN    # 65 yo female with rectal pain / minor bleeding likely secondary to internal hemorrhoids. Suspected thrombosed internal hemorrhoid on DRE   --Anusol cream PR BID x 14 days --Avoid straining  # History of recurrent high risk colon adenomas including a 50 mm tubular adenoma removed by EMR in 2019 . She subsequently underwent two close follow up colonoscopies with findings of residual adenomatous tissue at previous EMR site. She is nearly two years overdue for a recommended 6 month surveillance colonoscopy  --Patient will be scheduled for a colonoscopy. The risks and benefits of colonoscopy with possible polypectomy / biopsies were discussed and the patient agrees to proceed.     HISTORY OF PRESENT ILLNESS     Chief Complaint : rectal pain  Alison Brooks is a 65 y.o. female known to Dr. Henrene Pastor with a past medical history significant for recurrent adenomatous colon polyps, [facility] of colon cancer, GERD , hypertension, hyperlipidemia,  hypothyroidism, kidney stones  Alison Brooks has a history of recurrent, high risk colon adenomas dating back to at least 2007. In November 2019 a 50 mm laterally spreading tumor was removed piecemeal from ascending colon.  Path compatible with tubular adenoma without high-grade dysplasia.  Interval 4 monthr follow up colonoscopy in March 2020 showed residual polypoid tissue at hepatic flexure, path c/w tubular adenoma.  A 3 month surveillance colonoscopy recommended, completed December 2020 with findings of a 10 mm x 5 mm sessile polyp at the hepatic flexure in the region of EMR.  Path compatible with tubular adenoma.  A 81-month follow-up colonoscopy was recommended but patient didn't have it done. 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 She is nearly 3 years overdue  A few weeks ago Alison Brooks began having rectal pain and bleeding associated with a thrombosed (prolapsing?) hemorrhoid. She used  Prep H inside the rectum and the swelling improved but she has continued to have rectal discomfort with and without bowel movements.. She gives a history of chronic, intermittent loose stools / IBS and denies recent constipation / straining. Her stools have actually been soft but flat with an indention as if they are being compressed by something in the rectum. Since using Prep H the bleeding has significantly improved    PREVIOUS EVALUATIONS:   Nov 2019 colonoscopy  One greater than 50 mm polyp in the proximal ascending colon, removed using injection-lift and a hot snare. Resected  and retrieved. Tattooed. - The examination was otherwise normal on direct and retroflexion views. --48-month surveillance colonoscopy recommended Surgical [P], descending, transverse, polyp (2) - TUBULAR ADENOMA(S). - HIGH GRADE DYSPLASIA IS NOT IDENTIFIED. 2. Surgical [P], ascending, polyp - TUBULAR ADENOMA(S). - HIGH GRADE DYSPLASIA IS NOT IDENTIFIED. JOSHUA   March 2020 polyp surveillance colonoscopy --Previous marking tattoo was easily identified. As well prior polypectomy site with residual polypoid tissue measuring 8 mm in length and 3 mm in width polyp was found in the hepatic flexure. The polyp was sessile and slightly friable. Biopsies were taken with a cold forceps for histology. The polyp was then removed with a hot snare and cauterized. Resection and retrieval were complete. --three month follow up colonoscopy recommended   1. Surgical [P], colon, hepatic flexure, prior polypectomy site, polyp - TUBULAR ADENOMA. - NO HIGH GRADE DYSPLASIA OR MALIGNANCY. 2. Surgical [P], colon, hepatic flexure, same polyp as bot #1 - prior polypectomy site, polyp - TUBULAR ADENOMA. - NO  HIGH GRADE DYSPLASIA OR MALIGNANCY.  December 2020 polyp surveillance colonoscopy --Complete exam, good bowel prep --10 mm x 5 mm sessile polyp at the hepatic flexure in the region of prior EMR.  Marking tattoo adjacent.  Polyp  removed with hot snare, residual tissue and edges were cauterized . Resection and retrieval were felt to be complete  --Left diverticula --Surveillance colonoscopy with peds scope and retroflexion recommended at 6 months  Surgical [P], colon, hepatic flexure, polyp - MULTIPLE FRAGMENTS OF TUBULAR ADENOMA(S) - NO HIGH GRADE DYSPLASIA OR MALIGNANCY IDENTIFIED    Past Medical History:  Diagnosis Date  . Anemia   . Anxiety   . Arthritis    back, neck  . Cancer ([place])    skin cancer- squamous cell- left arm and shoulder  . Chronic back pain   . Chronic kidney disease    kidney stones  . COPD (chronic obstructive pulmonary disease) ([place])   . Depression   . Fibromyalgia   . GERD (gastroesophageal reflux disease)   . Heart murmur   . History of adenomatous polyp of colon   . History of kidney stones   . History of seizure    x1 2007 post op (per pt neurologist work-up done ?mixture of anesthesia medication and prozac that pt had taken)  and has not any issues since   . History of squamous cell carcinoma excision   . Hyperlipidemia   . Hypertension   . IC (interstitial cystitis)   . Migraine   . Osteoporosis   . Pulmonary nodules    multiple per ct -- monitored by pcp  . Right ureteral stone   . RLS (restless legs syndrome)   . Seizures ([place])    one time incident- greater than 10 years ago, none since  . Thyroid disease      Past Surgical History:  Procedure Laterality Date  . CARDIOVASCULAR STRESS TEST  09-02-2004   normal perfusion study/  normal LV function and wall motion, ef 64%  . CARPAL TUNNEL RELEASE Right   . CERVICAL FUSION  1992  . CHOLECYSTECTOMY  1995  . COLONOSCOPY    . CYSTO/  BOTOX INJECTION  12-27-2008  &  04-01-2006  . CYSTO/  HYDRODISTENTION/  INSTILLATION THERAPY  03-04-2006  . CYSTOSCOPY W/ URETERAL STENT PLACEMENT Right 04/02/2016   Procedure: CYSTOSCOPY WITH RIGHT RETROGRADE, URETEROSCOPY PYELOGRAM LASER LITHOTRIPSY,/URETERAL STENT PLACEMENT;   Surgeon: Kathie Rhodes, MD;  Location: [facility];  Service: Urology;  Laterality: Right;  . HEMORRHOID SURGERY  01-03-2004  . HOLMIUM LASER APPLICATION Right 06/02/8279   Procedure: HOLMIUM LASER APPLICATION;  Surgeon: Kathie Rhodes, MD;  Location: [facility];  Service: Urology;  Laterality: Right;  . LUMBAR FUSION  1996   L4 --S1  . ROTATOR CUFF REPAIR Left 2004  . SHOULDER ARTHROSCOPY WITH BICEPS TENDON REPAIR Left 03/ 2017  . SPINAL CORD STIMULATOR IMPLANT  x4  last one 2013  . STONE EXTRACTION WITH BASKET Right 04/02/2016   Procedure: STONE EXTRACTION WITH BASKET;  Surgeon: Kathie Rhodes, MD;  Location: [facility];  Service: Urology;  Laterality: Right;  . TONSILLECTOMY  1964  . TRANSTHORACIC ECHOCARDIOGRAM  04-17-2014   EF 60-65%/  trivial AR, MR, and TR  . UPPER GASTROINTESTINAL ENDOSCOPY    . VAGINAL HYSTERECTOMY  1984  . WRIST GANGLION EXCISION Right 02-05-2004   Family History  Problem Relation Age of Onset  . Heart attack Mother        Mom 50,  Dad 28  . Hypertension Mother   . Heart disease Mother   . Heart disease Father   . Diabetes Father   . Colon cancer Maternal Grandmother        not sure age of onset  . Esophageal cancer Neg Hx   . Rectal cancer Neg Hx   . Stomach cancer Neg Hx    Social History   Tobacco Use  . Smoking status: Former Smoker    Packs/day: 1.25    Years: 40.00    Pack years: 50.00    Types: Cigarettes    Quit date: 06/07/2008    Years since quitting: 12.6  . Smokeless tobacco: Never Used  Vaping Use  . Vaping Use: Never used  Substance Use Topics  . Alcohol use: Yes    Alcohol/week: 0.0 standard drinks    Comment: VERY RARE  . Drug use: No   Current Outpatient Medications  Medication Sig Dispense Refill  . albuterol (PROAIR HFA) 108 (90 Base) MCG/ACT inhaler Inhale 2 puffs into the lungs every 6 (six) hours as needed for wheezing or shortness of breath. 1 each 0  . allopurinol (ZYLOPRIM)  100 MG tablet TAKE 1 TABLET BY MOUTH  DAILY 90 tablet 3  . amLODipine (NORVASC) 2.5 MG tablet Take 1 tablet (2.5 mg total) by mouth daily. 90 tablet 3  . baclofen (LIORESAL) 10 MG tablet TAKE 1/2 TO 1 TABLET BY  MOUTH 3 TIMES DAILY 180 tablet 0  . benzonatate (TESSALON PERLES) 100 MG capsule Take 1 capsule (100 mg total) by mouth 2 (two) times daily as needed. 30 capsule 0  . cyanocobalamin (,VITAMIN B-12,) 1000 MCG/ML injection 1000 mcg (1 mg) injection once per per month or as directed 30 mL 1  . diclofenac sodium (VOLTAREN) 1 % GEL Apply 4 g topically 4 (four) times daily. (Patient taking differently: Apply 4 g topically as needed.) 100 g 1  . ELDERBERRY PO Take by mouth.    . escitalopram (LEXAPRO) 20 MG tablet TAKE 1 TABLET BY MOUTH  DAILY 90 tablet 3  . ezetimibe (ZETIA) 10 MG tablet TAKE 1 TABLET BY MOUTH  DAILY 90 tablet 3  . famotidine (PEPCID) 20 MG tablet Take 1 tablet (20 mg total) by mouth at bedtime. 30 tablet 6  . ibuprofen (ADVIL) 800 MG tablet Take 1 tablet (800 mg total) by mouth 3 (three) times daily. 30 tablet 0  . levothyroxine (SYNTHROID, LEVOTHROID) 50 MCG tablet     . oxybutynin (DITROPAN) 5 MG tablet TAKE 1 TABLET BY MOUTH 2  TIMES DAILY AS NEEDED FOR  BLADDER SPASMS. 180 tablet 3  . pantoprazole (PROTONIX) 40 MG tablet TAKE 1 TABLET BY MOUTH  TWICE DAILY 30 TO 60  MINUTES BEFORE MEALS 180 tablet 1  . rosuvastatin (CRESTOR) 40 MG tablet TAKE 1 TABLET BY MOUTH  DAILY 90 tablet 3  . Tiotropium Bromide Monohydrate (SPIRIVA RESPIMAT) 2.5 MCG/ACT AERS Inhale 2 puffs into the lungs daily. 4 g 5  . topiramate (TOPAMAX) 200 MG tablet TAKE 1 TABLET BY MOUTH  DAILY 90 tablet 3   No current facility-administered medications for this visit.   Allergies  Allergen Reactions  . Latex Other (See Comments)    blisters  . Oxycodone Nausea And Vomiting    And sweating  . Zofran [Ondansetron Hcl] Other (See Comments)    headache     Review of Systems: All  systems reviewed and  negative except where noted in HPI.   PHYSICAL EXAM :  Wt Readings from Last 3 Encounters:  01/26/21 160 lb (72.6 kg)  01/01/21 158 lb (71.7 kg)  11/24/20 152 lb (68.9 kg)    BP 138/64   Pulse 70   Ht 5\' 2"  (1.575 m)   Wt 160 lb (72.6 kg)   BMI 29.26 kg/m  Constitutional:  Pleasant  female in no acute distress. Psychiatric: Normal mood and affect. Behavior is normal. EENT: Pupils normal.  Conjunctivae are normal. No scleral icterus. Neck supple.  Cardiovascular: Normal rate, regular rhythm. No edema Pulmonary/chest: Effort normal and breath sounds normal. No wheezing, rales or rhonchi. Abdominal: Soft, nondistended, nontender. Bowel sounds active throughout. There are no masses palpable. No hepatomegaly. Rectal: In left lateral position there is a pea size,  slightly firm perianal lesion ( possibly resolving external thrombosed hemorrhoid).  On DRE there is what feels like a large thrombosed internal hemorrhoid ( left lateral) Neurological: Alert and oriented to person place and time. Skin: Skin is warm and dry. No rashes noted.  Tye Savoy, NP  01/26/2021, 3:07 PM  Cc:  [name], MD

## 2021-01-27 ENCOUNTER — Encounter: Payer: Self-pay | Admitting: Nurse Practitioner

## 2021-01-28 NOTE — Progress Notes (Signed)
Assessment and plan as noted 

## 2021-02-02 ENCOUNTER — Other Ambulatory Visit: Payer: Self-pay | Admitting: Family Medicine

## 2021-02-09 ENCOUNTER — Other Ambulatory Visit: Payer: Self-pay | Admitting: Family Medicine

## 2021-02-10 ENCOUNTER — Telehealth: Payer: Self-pay | Admitting: Family Medicine

## 2021-02-10 NOTE — Chronic Care Management (AMB) (Signed)
  Chronic Care Management   Note  02/10/2021 Name: Alison Brooks MRN: [id number] DOB: 03/07/1956  Alison Brooks is a 65 y.o. year old female who is a primary care patient of [name], MD. I reached out to Alison Brooks by phone today in response to a referral sent by Ms. Yisroel Ramming PCP, [name], MD.   Ms. Laird was given information about Chronic Care Management services today including:  1. CCM service includes personalized support from designated clinical staff supervised by her physician, including individualized plan of care and coordination with other care providers 2. 24/7 contact phone numbers for assistance for urgent and routine care needs. 3. Service will only be billed when office clinical staff spend 20 minutes or more in a month to coordinate care. 4. Only one practitioner may furnish and bill the service in a calendar month. 5. The patient may stop CCM services at any time (effective at the end of the month) by phone call to the office staff.   Patient agreed to services and verbal consent obtained.   Follow up plan:   Lauretta Grill [employer] Scheduler

## 2021-02-19 ENCOUNTER — Telehealth: Payer: Self-pay

## 2021-02-19 NOTE — Chronic Care Management (AMB) (Signed)
Chronic Care Management Pharmacy Assistant   Name: Alison Brooks  MRN: [id number] DOB: May 17, 1956  Alison Brooks is an 65 y.o. year old female who presents for her initial CCM visit with the clinical pharmacist.  Reason for Encounter: Chart Prep  Recent office visits:  11/24/20- Alison Reddish, MD- seen for Persistent Fatigue/cough after covid in patient with COPD, short course azithromycin x5 DS for bacterial sinus infection, short course prednisone x 7DS, follow up at next available physical or sooner 11/18/20-  Alison Benton, DO (Video Visit)- Seen for cough, fatigue, and diarrhea. Advice only as follows:  trial imodium and dairy free diet, tessalon for cough if needed (she still has some of this), Vit C and D and close follow up with PCP as would need exam and possibly labs/xray if symptoms persist. 10/28/20- Alison Benton, DO( Video Visit)- seen for Covid- 19, started proair q6h prn, started benzonatate 100 mg tid prn, instructed to avoid dairy. Tylenol, aleve, Vit D3, Vit C lozenges, and  nasal saline  Ok to use for sxs  Recent consult visits:  01/26/21- Alison Brooks, Newell Coral, NP Alison Brooks)- Seen for rectal pain, Dx: Personal history of colonic polyps, short course  anusol cream bid x14 DS, recommended otc use of miralax before bed, Prep script written, coloscopy scheduled  11/27/21Scot Jun, FNP( [facility])- seen for laceration of left thumb without damage to nail, follow up 3 days for check  Hospital visits:  None in previous 6 months  Medications: Outpatient Encounter Medications as of 02/19/2021  Medication Sig Note  . albuterol (VENTOLIN HFA) 108 (90 Base) MCG/ACT inhaler INHALE 2 PUFFS EVERY 6 HOURS AS NEEDED.   Marland Kitchen allopurinol (ZYLOPRIM) 100 MG tablet TAKE 1 TABLET BY MOUTH  DAILY   . amLODipine (NORVASC) 2.5 MG tablet Take 1 tablet (2.5 mg total) by mouth daily.   . baclofen (LIORESAL) 10 MG tablet TAKE 1/2 TO 1 TABLET BY  MOUTH 3 TIMES  DAILY 06/05/2020: As needed  . benzonatate (TESSALON PERLES) 100 MG capsule Take 1 capsule (100 mg total) by mouth 2 (two) times daily as needed.   . cyanocobalamin (,VITAMIN B-12,) 1000 MCG/ML injection 1000 mcg (1 mg) injection once per per month or as directed   . diclofenac sodium (VOLTAREN) 1 % GEL Apply 4 g topically 4 (four) times daily. (Patient taking differently: Apply 4 g topically as needed.)   . ELDERBERRY PO Take by mouth.   . escitalopram (LEXAPRO) 20 MG tablet TAKE 1 TABLET BY MOUTH  DAILY   . ezetimibe (ZETIA) 10 MG tablet TAKE 1 TABLET BY MOUTH  DAILY   . famotidine (PEPCID) 20 MG tablet Take 1 tablet (20 mg total) by mouth at bedtime.   Marland Kitchen ibuprofen (ADVIL) 800 MG tablet Take 1 tablet (800 mg total) by mouth 3 (three) times daily.   Marland Kitchen levothyroxine (SYNTHROID, LEVOTHROID) 50 MCG tablet    . oxybutynin (DITROPAN) 5 MG tablet TAKE 1 TABLET BY MOUTH 2  TIMES DAILY AS NEEDED FOR  BLADDER SPASMS.   Marland Kitchen pantoprazole (PROTONIX) 40 MG tablet TAKE 1 TABLET BY MOUTH  TWICE DAILY 30 TO 60  MINUTES BEFORE MEALS   . rosuvastatin (CRESTOR) 40 MG tablet TAKE 1 TABLET BY MOUTH  DAILY   . SUPREP BOWEL PREP KIT 17.5-3.13-1.6 GM/177ML SOLN Take 1 kit by mouth as directed. For colonoscopy prep   . Tiotropium Bromide Monohydrate (SPIRIVA RESPIMAT) 2.5 MCG/ACT AERS Inhale 2 puffs into the lungs  daily.   . topiramate (TOPAMAX) 200 MG tablet TAKE 1 TABLET BY MOUTH  DAILY   . [DISCONTINUED] orlistat (XENICAL) 120 MG capsule Take 1 capsule (120 mg total) by mouth 3 (three) times daily with meals.    No facility-administered encounter medications on file as of 02/19/2021.   Current Documented Medications  Ventolin HFA- 25 DS last filled 02/09/21 Allopurinol 100 mg- 90 DS last filled 10/07/20- PRN w. Flare ups  Amlodipine 2.5 mg- 90 DS last filled 05/27/2018-  Baclofen 10 mg- 60 DS last filled 04/24/20- PRN  Benzonatate 100 mg- 15 DS last filled 01/01/21 Cyanocobalamin 1000 mcg/ml- 84 DS lat filled  02/16/21 Diclofenac Sodium 1% PRN [place] Escitalopram 20 mg- 90 DS last filled 10/07/20- Patient received 90 DS in the last month unsure Ezetimibe 10 mg- 90 DS last filled 12/09/20 Famotidine 20 mg....... Ibuprofen 800 mg Levothyroxine 50 mcg- 90 DS last filled 02/06/21 Oxybutynin 5 mg- PRN Pantoprazole 40 mg.... Rosuvastatin 40 mg- 90 DS last filled 10/07/20- Patient received 90 DS Spiriva Respimat- 30 DS last filled 02/05/21 Topiramate 200 mg- 90 DS last filled 12/09/20  Have you seen any other providers since your last visit? 04/04/22Willia Craze, NP Alison Brooks)- Seen for rectal pain  Any changes in your medications or health?  Patient stated there are no changes in medications  Any side effects from any medications?  Patient stated there are currently no side effects with medications  Do you have an symptoms or problems not managed by your medications?  Patient stated there are no unmanaged symptoms  Any concerns about your health right now? Patient stated she has no urgent concerns. She did express that she was experiencing brain fog following her dx with covid- 19 and it does effect her in some ways.  Has your provider asked that you check blood pressure, blood sugar, or follow special diet at home?  Patient stated she was asked to check her BP with her arm cuff, but she has not done so lately. Alison Brooks does not follow a special diet. Her and her husband usually eat take- out or greasy home cooked meals.   Do you get any type of exercise on a regular basis?  Patient stated she does not get much activity on a regular basis. She was off from work for 4 months  due to Alison Brooks and hasn't done much since. She did attempt go back to work, but the mandate would not allow her to do so. 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  Patient stated she would like to lose weight and make better  eating decisions  Do you have any problems getting your medications? Patient stated she has been experiencing issues with Alison Brooks and their auto refills. Ms. Musso stated that she has a hard time keeping up with the bottles they send her her medications in. She has been putting her new fills into her old bottles which has lead to some confusion and absence of fill hx. Patient did express interest in any program that helps with medication adherence.   Is there anything that you would like to discuss during the appointment? Patient stated she would like to discuss pharmacy options as well as advice for healthy lifestyles.   Please bring medications and supplements to appointment Reminded patient of initial phone visit with CPP on 05/03 at 9 am.  [employer]  CPA, CMA

## 2021-02-23 ENCOUNTER — Telehealth: Payer: Medicare Other

## 2021-02-23 NOTE — Progress Notes (Signed)
Chronic Care Management Pharmacy Note  02/24/2021 Name:  Alison Brooks MRN:  [id number] DOB:  Jan 18, 1956  Subjective: Alison Brooks is an 65 y.o. year old female who is a primary patient of Hunter, Brayton Mars, MD.  The CCM team was consulted for assistance with disease management and care coordination needs.    Engaged with patient by telephone for initial visit in response to provider referral for pharmacy case management and/or care coordination services.   Consent to Services:  The patient was given the following information about Chronic Care Management services today, agreed to services, and gave verbal consent: 1. CCM service includes personalized support from designated clinical staff supervised by the primary care provider, including individualized plan of care and coordination with other care providers 2. 24/7 contact phone numbers for assistance for urgent and routine care needs. 3. Service will only be billed when office clinical staff spend 20 minutes or more in a month to coordinate care. 4. Only one practitioner may furnish and bill the service in a calendar month. 5.The patient may stop CCM services at any time (effective at the end of the month) by phone call to the office staff. 6. The patient will be responsible for cost sharing (co-pay) of up to 20% of the service fee (after annual deductible is met). Patient agreed to services and consent obtained.  Patient Care Team: [name], MD as PCP - General (Family Medicine) Madelin Rear, [facility] as Pharmacist (Pharmacist)  Recent office visits:  11/24/20- Garret Reddish, MD- seen for Persistent Fatigue/cough after covid in patient with COPD, short course azithromycin x5 DS for bacterial sinus infection, short course prednisone x 7DS, follow up at next available physical or sooner 11/18/20-  Colin Benton, DO (Video Visit)- Seen for cough, fatigue, and diarrhea. Advice only as follows:  trial imodium and dairy free diet,  tessalon for cough if needed (she still has some of this), Vit C and D and close follow up with PCP as would need exam and possibly labs/xray if symptoms persist. 10/28/20- Colin Benton, DO( Video Visit)- seen for Covid- 19, started proair q6h prn, started benzonatate 100 mg tid prn, instructed to avoid dairy. Tylenol, aleve, Vit D3, Vit C lozenges, and  nasal saline  Ok to use for sxs  Recent consult visits:  01/26/21- Chester Holstein, Newell Coral, NP Gertie Fey)- Seen for rectal pain, Dx: Personal history of colonic polyps, short course  anusol cream bid x14 DS, recommended otc use of miralax before bed, Prep script written, coloscopy scheduled  11/27/21Scot Jun, FNP( [facility])- seen for laceration of left thumb without damage to nail, follow up 3 days for check  Hospital visits:  None in previous 6 months Objective:  Lab Results  Component Value Date   CREATININE 0.6 09/26/2020   CREATININE 0.77 05/23/2020   CREATININE 0.84 07/19/2019   No results found for: HGBA1C Last diabetic Eye exam: No results found for: HMDIABEYEEXA  Last diabetic Foot exam: No results found for: HMDIABFOOTEX      Component Value Date/Time   CHOL 265 (H) 12/05/2018 1621   TRIG 159.0 (H) 12/05/2018 1621   TRIG 59 11/07/2006 1121   HDL 45.90 12/05/2018 1621   CHOLHDL 6 12/05/2018 1621   VLDL 31.8 12/05/2018 1621   LDLCALC 188 (H) 12/05/2018 1621   LDLDIRECT 170.0 07/17/2019 1510    Hepatic Function Latest Ref Rng & Units 05/23/2020 07/19/2019 12/05/2018  Total Protein 6.1 - 8.1 g/dL  6.2 6.1 6.4  Albumin 3.5 - 5.2 g/dL - 4.2 4.3  AST 10 - 35 U/L 15 13 14   ALT 6 - 29 U/L 14 10 12   Alk Phosphatase 39 - 117 U/L - 81 89  Total Bilirubin 0.2 - 1.2 mg/dL 0.6 0.4 0.4  Bilirubin, Direct 0.0 - 0.3 mg/dL - - -    Lab Results  Component Value Date/Time   TSH 1.71 07/17/2019 03:10 PM   TSH 3.00 12/25/2015 11:02 AM   FREET4 0.77 01/08/2015 12:32 PM   FREET4 0.69 07/02/2014 09:27 AM     CBC Latest Ref Rng & Units 05/23/2020 07/17/2019 12/05/2018  WBC 3.8 - 10.8 Thousand/uL 7.2 7.9 7.7  Hemoglobin 11.7 - 15.5 g/dL 13.3 13.3 14.2  Hematocrit 35.0 - 45.0 % 40.1 40.2 42.4  Platelets 140 - 400 Thousand/uL 197 199.0 239.0    No results found for: VD25OH  Clinical ASCVD:  The 10-year ASCVD risk score Mikey Bussing DC Jr., et al., 2013) is: 12.7%   Values used to calculate the score:     Age: 46 years     Sex: Female     Is Non-Hispanic African American: No     Diabetic: No     Tobacco smoker: No     Systolic Blood Pressure: 923 mmHg     Is BP treated: Yes     HDL Cholesterol: 45.9 mg/dL     Total Cholesterol: 265 mg/dL    CAT ASSESSMENT  Rank each of the following items on a scale of 0 to 5 (with 5 being most severe) Write a # 0-5 in each box  I never cough (0) > I cough all the time (5) 3  I have no phlegm (mucus) in my chest (0) > My chest is completely full of phlegm (mucus) (5) 0  My chest does not feel tight at all (0) > My chest feels very tight (5) 0  When I walk up a hill or one flight of stairs I am not breathless (0) > When I walk up a hill or one flight of stairs I am very breathless (5) 2  I am not limited doing any activities at home (0) > I am very limited doing activities at home (5) 3  I am confident leaving my home despite my lung function (0) > I am not at all confident leaving my home because of my lung condition (5)  3  I sleep soundly (0) > I don't sleep soundly because of my lung condition (5) 0  I have lots of energy (0) > I have no energy at all (5) 5  Total CAT Score: 17  Social History   Tobacco Use  Smoking Status Former Smoker  . Packs/day: 1.25  . Years: 40.00  . Pack years: 50.00  . Types: Cigarettes  . Quit date: 06/07/2008  . Years since quitting: 12.7  Smokeless Tobacco Never Used   BP Readings from Last 3 Encounters:  01/26/21 138/64  11/24/20 (!) 144/90  09/20/20 (!) 152/77   Pulse Readings from Last 3 Encounters:  01/26/21  70  11/24/20 61  09/20/20 65   Wt Readings from Last 3 Encounters:  01/26/21 160 lb (72.6 kg)  01/01/21 158 lb (71.7 kg)  11/24/20 152 lb (68.9 kg)   Assessment: Review of patient past medical history, allergies, medications, health status, including review of consultants reports, laboratory and other test data, was performed as part of comprehensive evaluation and provision of chronic care management services.  SDOH:  (Social Determinants of Health) assessments and interventions performed: Yes.   CCM Care Plan  Allergies  Allergen Reactions  . Latex Other (See Comments)    blisters  . Oxycodone Nausea And Vomiting    And sweating  . Zofran [Ondansetron Hcl] Other (See Comments)    headache    Medications Reviewed Today    Reviewed by Madelin Rear, [facility] (Pharmacist) on 02/24/21 at [place] List Status: <None>  Medication Order Taking? Sig Documenting Provider Last Dose Status Informant  albuterol (VENTOLIN HFA) 108 (90 Base) MCG/ACT inhaler [id number] Yes INHALE 2 PUFFS EVERY 6 HOURS AS NEEDED. [name], MD Taking Active   allopurinol (ZYLOPRIM) 100 MG tablet [id number] Yes TAKE 1 TABLET BY MOUTH  DAILY [name], MD Taking Active   amLODipine (NORVASC) 2.5 MG tablet [id number] Yes Take 1 tablet (2.5 mg total) by mouth daily. [name], MD Taking Active   baclofen (LIORESAL) 10 MG tablet [id number]  TAKE 1/2 TO 1 TABLET BY  MOUTH 3 TIMES DAILY [name], MD  Active            Med Note Willette Brace   Thu Jun 05, 2020 10:21 AM) As needed  benzonatate (TESSALON PERLES) 100 MG capsule [id number]  Take 1 capsule (100 mg total) by mouth 2 (two) times daily as needed. [name], MD  Active   cyanocobalamin (,VITAMIN B-12,) 1000 MCG/ML injection [id number] Yes 1000 mcg (1 mg) injection once per per month or as directed [name], MD  Active   diclofenac sodium (VOLTAREN) 1 % GEL [id number]  Apply 4 g topically 4 (four) times daily.   Patient taking differently: Apply 4 g topically as needed.   Vivi Barrack, MD  Active   ELDERBERRY PO [id number] Yes Take by mouth. [provider]  Active   escitalopram (LEXAPRO) 20 MG tablet [id number] Yes TAKE 1 TABLET BY MOUTH  DAILY [name], MD  Active   ezetimibe (ZETIA) 10 MG tablet [id number] Yes TAKE 1 TABLET BY MOUTH  DAILY [name], MD  Active   famotidine (PEPCID) 20 MG tablet [id number]  Take 1 tablet (20 mg total) by mouth at bedtime. Irene Shipper, MD  Active         Discontinued 02/24/21 [phone] (No longer needed (for PRN medications))   levothyroxine (SYNTHROID, LEVOTHROID) 50 MCG tablet [id number] Yes  [provider]  Active         Discontinued 02/29/16 2054   oxybutynin (DITROPAN) 5 MG tablet [id number]  TAKE 1 TABLET BY MOUTH 2  TIMES DAILY AS NEEDED FOR  BLADDER SPASMS. [name], MD  Active   pantoprazole (PROTONIX) 40 MG tablet [id number]  TAKE 1 TABLET BY MOUTH  TWICE DAILY 30 TO 60  MINUTES BEFORE MEALS Irene Shipper, MD  Active   rosuvastatin (CRESTOR) 40 MG tablet [id number]  TAKE 1 TABLET BY MOUTH  DAILY [name], MD  Active   SUPREP BOWEL PREP KIT 17.5-3.13-1.6 GM/177ML SOLN [id number]  Take 1 kit by mouth as directed. For colonoscopy prep Willia Craze, NP  Active   Tiotropium Bromide Monohydrate (SPIRIVA RESPIMAT) 2.5 MCG/ACT AERS [id number]  Inhale 2 puffs into the lungs daily. [name], MD  Active   topiramate (TOPAMAX) 200 MG tablet [id number]  TAKE 1 TABLET BY MOUTH  DAILY [name], MD  Active           Patient  Active Problem List   Diagnosis Date Noted  . COPD (chronic obstructive pulmonary disease) ([place]) 11/24/2020  . History of total hysterectomy 07/17/2019  . Hypothyroidism 07/17/2019  . Vertigo 11/06/2016  . Dyspnea 11/02/2014  . Upper airway cough syndrome 11/02/2014  . Gout 06/28/2014  . Vitamin B12 deficiency 06/28/2014  . Multiple pulmonary nodules 06/28/2014  . Essential  hypertension 05/17/2014  . Obesity (BMI 30-39.9) 01/14/2014  . Lateral epicondylitis (tennis elbow) 01/02/2014  . GERD (gastroesophageal reflux disease) 03/26/2013  . LEG CRAMPS 05/16/2009  . RESTLESS LEG SYNDROME, SEVERE 03/14/2009  . Chronic interstitial cystitis 01/20/2009  . Depression, major, recurrent ([place]) 03/19/2008  . TUBULOVILLOUS ADENOMA, COLON 02/17/2008  . Hyperlipidemia 02/17/2008  . NEPHROLITHIASIS 02/17/2008  . Former smoker 12/21/2007  . MIGRAINE, CLASSICAL W/O INTRACTABLE MIGRAINE 06/09/2007  . SYMPTOM, MEMORY LOSS 06/09/2007  . 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 FIBROMYALGIA 04/21/2007    Immunization History  Administered Date(s) Administered  . Fluad Quad(high Dose 65+) 07/17/2019  . Influenza Whole 08/11/1999, 08/11/2007, 07/25/2009  . Influenza,inj,Quad PF,6+ Mos 09/03/2013, 07/12/2014, 08/04/2015  . Influenza-Unspecified 07/25/2017  . Moderna Sars-Covid-2 Vaccination 11/17/2019, 12/17/2019, 08/28/2020  . Pneumococcal Polysaccharide-23 07/17/2019  . Td 10/25/1998, 10/25/2006  . Tdap 04/20/2018   Conditions to be addressed/monitored: COPD, Hx of COVID-19 infection, hypothyroidism, HLD, HTN, MDD, RLS, GERD, Gout, Hx of B12 deficiency, former smoker   Care Plan : [place]  Updates made by Madelin Rear, [facility] since 02/24/2021 12:00 AM    Problem: COPD, Hx of COVID-19 infection, hypothyroidism, HLD, HTN, MDD, RLS, GERD, Gout, Hx of B12 deficiency, former smoker   Priority: High    Long-Range Goal: Disease Management   Start Date: 02/24/2021  Expected End Date: 02/24/2022  This Visit's Progress: On track  Priority: High  Note:   Pharmacist Clinical Goal(s):  Marland Kitchen Patient will contact provider office for questions/concerns as evidenced notation of same in electronic health record through collaboration with [name] and provider.   Interventions: . 1:1 collaboration with [name], MD regarding  development and update of comprehensive plan of care as evidenced by provider attestation and co-signature . Inter-disciplinary care team collaboration (see longitudinal plan of care) . Comprehensive medication review performed; medication list updated in electronic medical record . Screenings fyi for 03/2021 CPE or prior  o Open to scheduling DEXA and mammogram o Open to receiving PFTs for restaging/worsening symptoms if you feel appropriate . Medication refills  o Amlodipine 2.5 mg Rx - (CPP protocol now active in epic) sending to [facility] for pt o Baclofen 10 mg - requesting refill - ok for me to send?  Hypertension (BP goal <130/80) -Not ideally controlled -Had forgotten about amlodipine, needing Rx sent to [facility] -Current treatment: . Amlodipine 2.5 mg once daily -Current home readings: 859Y-924M systolic -Current dietary habits: fatty foods, organ meats, some bacon.  -Current exercise habits: works part time as Occupational hygienist 2x/week. Otherwise not exercising due to ongoing fatigue.  -Denies hypotensive/hypertensive symptoms -Educated on Importance of home blood pressure monitoring; Symptoms of hypotension and importance of maintaining adequate hydration; -Counseled to monitor BP at home 1-2x/week, document, and provide log at future appointments -Counseled on diet and exercise extensively Recommended to continue current medication  Hyperlipidemia: (LDL goal < 70) -Not ideally controlled (based on 2020 labs) -Current treatment: . Rosuvastatin 40 mg once daily . Ezetimibe 10 mg once daily  -Confirmed fills through [facility] -Educated on Cholesterol goals;  -Recommended to continue  current medication   COPD (Goal: control symptoms and prevent exacerbations) -Not ideally controlled -Reports worsening symptoms of fatigue and cough potentially exacerbated by COVID-19 infection -Former smoker, gets annual CT -Current treatment  . Spiriva Respimat 2 puffs daily . Ventolin inhaler as  needed  -Last PFT 2016, today's CAT score 17.  -Exacerbations requiring treatment in last 6 months: 1 -Patient reports consistent use of maintenance inhaler -Frequency of rescue inhaler use: 2x/week -Recommended to continue current medication Assessed medication financial assistance - no support needed at this time.  Depression (Goal: minimize symptoms) -Controlled -Current treatment: . Escitalopram 20 mg once daily  -PHQ9: 0.  Denies side effects.  -Educated on Benefits of medication for symptom control -Recommended to continue current medication  GERD (Goal: minimize symptoms ensure safe medication use) -Controlled -b12 deficiency receiving monthly injections  -Current treatment   . Famotidine 20 mg once every night at bedtime (Dr Henrene Pastor) . Pantoprazole 40 mg twice daily 30-60 minutes before meals (Dr Dawayne Patricia)  -Recommended to continue current medication Counseled on trigger avoidance including dairy, fried foods, laying down after eating  Gout (Goal: minimize symptoms) -Not ideally controlled  -Takes allopurinol as needed, routine intake of organ based meats such as fried liver, bacon, minimal water intake. Denies alcohol use.  -Current treatment  . Allopurinol 100 mg once daily as needed  -Recommended focus on low purine diet - handout provided  Patient Goals/Self-Care Activities . Patient will:  - target a minimum of 150 minutes of moderate intensity exercise weekly engage in dietary modifications by avoidance of reflux triggers, low purine diet to help with gout  Medication Assistance: None required.  Patient affirms current coverage meets needs.  Patient's preferred pharmacy is:  [facility], [address], [address] [place], [place] [address] Phone: [phone] Fax: [phone]  Follow Up:  Patient agrees to Care Plan and Follow-up. Plan: [facility] f/u telephone call - assess gout, review dexa, COPD, help with  financial assistance if needed     Future Appointments  Date Time Provider [place]  03/10/2021 11:00 AM AP-CT 1 [facility]-CT [place] [facility]  03/31/2021  1:20 PM [name], MD [facility] P[facility]  04/07/2021  3:30 PM Irene Shipper, MD [facility]  06/11/2021 10:15 AM [facility] HEALTH COACH [facility]   Madelin Rear, Pharm.D., BCGP Clinical Pharmacist Practitioner  [place] [phone]

## 2021-02-24 ENCOUNTER — Ambulatory Visit (INDEPENDENT_AMBULATORY_CARE_PROVIDER_SITE_OTHER): Payer: Medicare Other

## 2021-02-24 DIAGNOSIS — K219 Gastro-esophageal reflux disease without esophagitis: Secondary | ICD-10-CM

## 2021-02-24 DIAGNOSIS — J441 Chronic obstructive pulmonary disease with (acute) exacerbation: Secondary | ICD-10-CM

## 2021-02-24 DIAGNOSIS — F3342 Major depressive disorder, recurrent, in full remission: Secondary | ICD-10-CM | POA: Diagnosis not present

## 2021-02-24 DIAGNOSIS — I10 Essential (primary) hypertension: Secondary | ICD-10-CM

## 2021-02-24 DIAGNOSIS — M10072 Idiopathic gout, left ankle and foot: Secondary | ICD-10-CM

## 2021-02-24 MED ORDER — AMLODIPINE BESYLATE 2.5 MG PO TABS
2.5000 mg | ORAL_TABLET | Freq: Every day | ORAL | 1 refills | Status: DC
Start: 2021-02-24 — End: 2021-06-03

## 2021-02-24 NOTE — Patient Instructions (Addendum)
Alison Brooks,  Thank you for talking with me today. I have included our care plan/goals in the following pages.   Please review and call me at [phone] with any questions.  Thanks! [name], Pharm.D., BCGP Clinical Pharmacist [facility] [place] [phone] Patient Care Plan: [place] Plan    Problem Identified: COPD, Hx of COVID-19 infection, hypothyroidism, HLD, HTN, MDD, RLS, GERD, Gout, Hx of B12 deficiency, former smoker   Priority: High    Long-Range Goal: Disease Management   Start Date: 02/24/2021  Expected End Date: 02/24/2022  This Visit's Progress: On track  Priority: High  Note:   Pharmacist Clinical Goal(s):  Marland Kitchen Patient will contact provider office for questions/concerns as evidenced notation of same in electronic health record through collaboration with [name] and provider.   Interventions: . 1:1 collaboration with [name], MD regarding development and update of comprehensive plan of care as evidenced by provider attestation and co-signature . Inter-disciplinary care team collaboration (see longitudinal plan of care) . Comprehensive medication review performed; medication list updated in electronic medical record . Screenings fyi for 03/2021 CPE or prior  o Open to scheduling DEXA and mammogram o Open to receiving PFTs for restaging/worsening symptoms if you feel appropriate . Medication refills  o Amlodipine 2.5 mg Rx - (CPP protocol now active in epic) sending to [facility] for pt o Baclofen 10 mg once daily requesting refill - ok for me to send?  Hypertension (BP goal <130/80) -Not ideally controlled -Had forgotten about amlodipine, needing Rx sent to [facility] -Current treatment: . Amlodipine 2.5 mg once daily -Current home readings: 175Z-025E systolic -Current dietary habits: fatty foods, organ meats, some bacon.  -Current exercise habits: works part time as Occupational hygienist 2x/week. Otherwise  not exercising due to ongoing fatigue.  -Denies hypotensive/hypertensive symptoms -Educated on Importance of home blood pressure monitoring; Symptoms of hypotension and importance of maintaining adequate hydration; -Counseled to monitor BP at home 1-2x/week, document, and provide log at future appointments -Counseled on diet and exercise extensively Recommended to continue current medication  Hyperlipidemia: (LDL goal < 70) -Not ideally controlled (based on 2020 labs) -Current treatment: . Rosuvastatin 40 mg once daily . Ezetimibe 10 mg once daily  -Confirmed fills through [facility] -Educated on Cholesterol goals;  -Recommended to continue current medication   COPD (Goal: control symptoms and prevent exacerbations) -Not ideally controlled -Reports worsening symptoms of fatigue and cough potentially exacerbated by COVID-19 infection -Former smoker, gets annual CT -Current treatment  . Spiriva Respimat 2 puffs daily . Ventolin inhaler as needed  -Last PFT 2016, today's CAT score 17.  -Exacerbations requiring treatment in last 6 months: 1 -Patient reports consistent use of maintenance inhaler -Frequency of rescue inhaler use: 2x/week -Recommended to continue current medication Assessed medication financial assistance - no support needed at this time.  Depression (Goal: minimize symptoms) -Controlled -Current treatment: . Escitalopram 20 mg once daily  -PHQ9: 0.  Denies side effects.  -Educated on Benefits of medication for symptom control -Recommended to continue current medication  GERD (Goal: minimize symptoms ensure safe medication use) -Controlled -b12 deficiency receiving monthly injections  -Current treatment   . Famotidine 20 mg once every night at bedtime (Dr Henrene Pastor) . Pantoprazole 40 mg twice daily 30-60 minutes before meals (Dr Dawayne Patricia)  -Recommended to continue current medication Counseled on trigger avoidance including dairy, fried foods, laying down after  eating  Gout (Goal: minimize symptoms) -Not ideally controlled  -Takes allopurinol  as needed, routine intake of organ based meats such as fried liver, bacon, minimal water intake. Denies alcohol use.  -Current treatment  . Allopurinol 100 mg once daily as needed  -Recommended focus on low purine diet - handout provided  Patient Goals/Self-Care Activities . Patient will:  - target a minimum of 150 minutes of moderate intensity exercise weekly engage in dietary modifications by avoidance of reflux triggers, low purine diet to help with gout  Medication Assistance: None required.  Patient affirms current coverage meets needs.  Patient's preferred pharmacy is:  [facility], [address], [address] [place], [place] [address] Phone: [phone] Fax: [phone]  Follow Up:  Patient agrees to Care Plan and Follow-up. Plan: [place] f/u telephone call - assess gout, review dexa, COPD, help with financial assistance if needed       The patient was given the following information about Chronic Care Management services today, agreed to services, and gave verbal consent: 1. CCM service includes personalized support from designated clinical staff supervised by the primary care provider, including individualized plan of care and coordination with other care providers 2. 24/7 contact phone numbers for assistance for urgent and routine care needs. 3. Service will only be billed when office clinical staff spend 20 minutes or more in a month to coordinate care. 4. Only one practitioner may furnish and bill the service in a calendar month. 5.The patient may stop CCM services at any time (effective at the end of the month) by phone call to the office staff. 6. The patient will be responsible for cost sharing (co-pay) of up to 20% of the service fee (after annual deductible is met). Patient agreed to services and consent obtained.  The patient  verbalized understanding of instructions provided today and agreed to receive a MyChart copy of patient instruction and/or educational materials. Telephone follow up appointment with pharmacy team member scheduled for: See next appointment with "Care Management Staff" under "What's Next" below.   Low-Purine Eating Plan A low-purine eating plan involves making food choices to limit your intake of purine. Purine is a kind of uric acid. Too much uric acid in your blood can cause certain conditions, such as gout and kidney stones. Eating a low-purine diet can help control these conditions. What are tips for following this plan? Reading food labels  Avoid foods with saturated or Trans fat.  Check the ingredient list of grains-based foods, such as bread and cereal, to make sure that they contain whole grains.  Check the ingredient list of sauces or soups to make sure they do not contain meat or fish.  When choosing soft drinks, check the ingredient list to make sure they do not contain high-fructose corn syrup. Shopping  Buy plenty of fresh fruits and vegetables.  Avoid buying canned or fresh fish.  Buy dairy products labeled as low-fat or nonfat.  Avoid buying premade or processed foods. These foods are often high in fat, salt (sodium), and added sugar.   Cooking  Use olive oil instead of butter when cooking. Oils like olive oil, canola oil, and sunflower oil contain healthy fats. Meal planning  Learn which foods do or do not affect you. If you find out that a food tends to cause your gout symptoms to flare up, avoid eating that food. You can enjoy foods that do not cause problems. If you have any questions about a food item, talk with your dietitian or health  care provider.  Limit foods high in fat, especially saturated fat. Fat makes it harder for your body to get rid of uric acid.  Choose foods that are lower in fat and are lean sources of protein. General guidelines  Limit alcohol  intake to no more than 1 drink a day for nonpregnant women and 2 drinks a day for men. One drink equals 12 oz of beer, 5 oz of wine, or 1 oz of hard liquor. Alcohol can affect the way your body gets rid of uric acid.  Drink plenty of water to keep your urine clear or pale yellow. Fluids can help remove uric acid from your body.  If directed by your health care provider, take a vitamin C supplement.  Work with your health care provider and dietitian to develop a plan to achieve or maintain a healthy weight. Losing weight can help reduce uric acid in your blood. What foods are recommended? The items listed may not be a complete list. Talk with your dietitian about what dietary choices are best for you. Foods low in purines Foods low in purines do not need to be limited. These include:  All fruits.  All low-purine vegetables, pickles, and olives.  Breads, pasta, rice, cornbread, and popcorn. Cake and other baked goods.  All dairy foods.  Eggs, nuts, and nut butters.  Spices and condiments, such as salt, herbs, and vinegar.  Plant oils, butter, and margarine.  Water, sugar-free soft drinks, tea, coffee, and cocoa.  Vegetable-based soups, broths, sauces, and gravies. Foods moderate in purines Foods moderate in purines should be limited to the amounts listed.   cup of asparagus, cauliflower, spinach, mushrooms, or green peas, each day.  2/3 cup uncooked oatmeal, each day.   cup dry wheat bran or wheat germ, each day.  2-3 ounces of meat or poultry, each day.  4-6 ounces of shellfish, such as crab, lobster, oysters, or shrimp, each day.  1 cup cooked beans, peas, or lentils, each day.  Soup, broths, or bouillon made from meat or fish. Limit these foods as much as possible. What foods are not recommended? The items listed may not be a complete list. Talk with your dietitian about what dietary choices are best for you. Limit your intake of foods high in purines,  including:  Beer and other alcohol.  Meat-based gravy or sauce.  Canned or fresh fish, such as: ? Anchovies, sardines, herring, and tuna. ? Mussels and scallops. ? Codfish, trout, and haddock.  Berniece Salines.  Organ meats, such as: ? Liver or kidney. ? Tripe. ? Sweetbreads (thymus gland or pancreas).  Wild Clinical biochemist.  Yeast or yeast extract supplements.  Drinks sweetened with high-fructose corn syrup. Summary  Eating a low-purine diet can help control conditions caused by too much uric acid in the body, such as gout or kidney stones.  Choose low-purine foods, limit alcohol, and limit foods high in fat.  You will learn over time which foods do or do not affect you. If you find out that a food tends to cause your gout symptoms to flare up, avoid eating that food. This information is not intended to replace advice given to you by your health care provider. Make sure you discuss any questions you have with your health care provider. Document Revised: 01/24/2020 Document Reviewed: 01/24/2020 Elsevier Patient Education  2021 [employer].

## 2021-02-25 ENCOUNTER — Other Ambulatory Visit: Payer: Self-pay

## 2021-02-25 ENCOUNTER — Telehealth: Payer: Self-pay

## 2021-02-25 DIAGNOSIS — Z008 Encounter for other general examination: Secondary | ICD-10-CM

## 2021-02-25 MED ORDER — BACLOFEN 10 MG PO TABS: ORAL_TABLET | ORAL | 0 refills | Status: DC

## 2021-02-25 NOTE — Telephone Encounter (Signed)
Referral has been placed. 

## 2021-02-25 NOTE — Telephone Encounter (Signed)
-----   Message from Madelin Rear, [facility] sent at 02/25/2021  8:26 AM EDT ----- Regarding: referral to pulmonology for PFTs Requesting referral to pulmonology for PFTs. I talked with Dr. Yong Channel and he is ok with this. Thanks! Alison Brooks

## 2021-02-25 NOTE — Addendum Note (Signed)
Addended by: Madelin Rear on: 02/25/2021 08:25 AM   Modules accepted: Orders

## 2021-03-10 ENCOUNTER — Ambulatory Visit (HOSPITAL_COMMUNITY)
Admission: RE | Admit: 2021-03-10 | Discharge: 2021-03-10 | Disposition: A | Discharge status: Home / Self Care | Payer: Medicare Other | Source: Ambulatory Visit | Attending: Acute Care | Admitting: Acute Care

## 2021-03-10 ENCOUNTER — Other Ambulatory Visit: Payer: Self-pay

## 2021-03-10 DIAGNOSIS — Z87891 Personal history of nicotine dependence: Secondary | ICD-10-CM | POA: Insufficient documentation

## 2021-03-13 NOTE — Progress Notes (Signed)
Please call patient and let them  know their  low dose Ct was read as a Lung RADS 2: nodules that are benign in appearance and behavior with a very low likelihood of becoming a clinically active cancer due to size or lack of growth. Recommendation per radiology is for a repeat LDCT in 12 months. .Please let them  know we will order and schedule their  annual screening scan for 02/2022. Please let them  know there was notation of CAD on their  scan.  Please remind the patient  that this is a non-gated exam therefore degree or severity of disease  cannot be determined. Please have them  follow up with their PCP regarding potential risk factor modification, dietary therapy or pharmacologic therapy if clinically indicated. Pt.  is  currently on statin therapy. Please place order for annual  screening scan for  02/2022 and fax results to PCP. Thanks so much. 

## 2021-03-19 ENCOUNTER — Encounter: Payer: Self-pay | Admitting: *Deleted

## 2021-03-19 DIAGNOSIS — Z87891 Personal history of nicotine dependence: Secondary | ICD-10-CM

## 2021-03-31 ENCOUNTER — Other Ambulatory Visit: Payer: Self-pay

## 2021-03-31 ENCOUNTER — Ambulatory Visit (INDEPENDENT_AMBULATORY_CARE_PROVIDER_SITE_OTHER): Payer: Medicare Other | Admitting: Family Medicine

## 2021-03-31 ENCOUNTER — Encounter: Payer: Self-pay | Admitting: Family Medicine

## 2021-03-31 VITALS — BP 124/66 | HR 64 | Temp 98.6°F | Ht 62.0 in | Wt 159.6 lb

## 2021-03-31 DIAGNOSIS — I700 Atherosclerosis of aorta: Secondary | ICD-10-CM | POA: Insufficient documentation

## 2021-03-31 DIAGNOSIS — R0789 Other chest pain: Secondary | ICD-10-CM

## 2021-03-31 DIAGNOSIS — E785 Hyperlipidemia, unspecified: Secondary | ICD-10-CM | POA: Diagnosis not present

## 2021-03-31 DIAGNOSIS — M10072 Idiopathic gout, left ankle and foot: Secondary | ICD-10-CM

## 2021-03-31 DIAGNOSIS — F3342 Major depressive disorder, recurrent, in full remission: Secondary | ICD-10-CM

## 2021-03-31 DIAGNOSIS — Z0000 Encounter for general adult medical examination without abnormal findings: Secondary | ICD-10-CM

## 2021-03-31 DIAGNOSIS — Z87891 Personal history of nicotine dependence: Secondary | ICD-10-CM

## 2021-03-31 DIAGNOSIS — E538 Deficiency of other specified B group vitamins: Secondary | ICD-10-CM | POA: Diagnosis not present

## 2021-03-31 LAB — CBC WITH DIFFERENTIAL/PLATELET
Basophils Absolute: 0 10*3/uL (ref 0.0–0.1)
Basophils Relative: 0.6 % (ref 0.0–3.0)
Eosinophils Absolute: 0.1 10*3/uL (ref 0.0–0.7)
Eosinophils Relative: 1.1 % (ref 0.0–5.0)
HCT: 40.4 % (ref 36.0–46.0)
Hemoglobin: 13.8 g/dL (ref 12.0–15.0)
Lymphocytes Relative: 31.5 % (ref 12.0–46.0)
Lymphs Abs: 2.8 10*3/uL (ref 0.7–4.0)
MCHC: 34.2 g/dL (ref 30.0–36.0)
MCV: 92.6 fl (ref 78.0–100.0)
Monocytes Absolute: 0.6 10*3/uL (ref 0.1–1.0)
Monocytes Relative: 6.3 % (ref 3.0–12.0)
Neutro Abs: 5.3 10*3/uL (ref 1.4–7.7)
Neutrophils Relative %: 60.5 % (ref 43.0–77.0)
Platelets: 177 10*3/uL (ref 150.0–400.0)
RBC: 4.37 Mil/uL (ref 3.87–5.11)
RDW: 14.4 % (ref 11.5–15.5)
WBC: 8.7 10*3/uL (ref 4.0–10.5)

## 2021-03-31 LAB — POC URINALSYSI DIPSTICK (AUTOMATED)
Bilirubin, UA: NEGATIVE
Blood, UA: POSITIVE
Glucose, UA: NEGATIVE
Ketones, UA: NEGATIVE
Leukocytes, UA: NEGATIVE
Nitrite, UA: NEGATIVE
Protein, UA: NEGATIVE
Spec Grav, UA: 1.025 (ref 1.010–1.025)
Urobilinogen, UA: 0.2 E.U./dL
pH, UA: 6 (ref 5.0–8.0)

## 2021-03-31 LAB — TSH: TSH: 1.76 u[IU]/mL (ref 0.35–4.50)

## 2021-03-31 LAB — VITAMIN B12: Vitamin B-12: 266 pg/mL (ref 211–911)

## 2021-03-31 NOTE — Progress Notes (Signed)
Phone [phone] In person visit   Subjective:   Alison Brooks is a 65 y.o. year old very pleasant female patient who presents for/with See problem oriented charting  This visit occurred during the SARS-CoV-2 public health emergency.  Safety protocols were in place, including screening questions prior to the visit, additional usage of staff PPE, and extensive cleaning of exam room while observing appropriate contact time as indicated for disinfecting solutions.   Past Medical History-  Patient Active Problem List   Diagnosis Date Noted  . COPD (chronic obstructive pulmonary disease) ([place]) 11/24/2020    Priority: High  . 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 Aortic atherosclerosis ([place]) 03/31/2021    Priority: Medium  . Gout 06/28/2014    Priority: Medium  . Vitamin B12 deficiency 06/28/2014    Priority: Medium  . Multiple pulmonary nodules 06/28/2014    Priority: Medium  . Essential hypertension 05/17/2014    Priority: Medium  . RESTLESS LEG SYNDROME, SEVERE 03/14/2009    Priority: Medium  . Chronic interstitial cystitis 01/20/2009    Priority: Medium  . Depression, major, recurrent ([place]) 03/19/2008    Priority: Medium  . Hyperlipidemia 02/17/2008    Priority: Medium  . Former smoker 12/21/2007    Priority: Medium  . FIBROMYALGIA 04/21/2007    Priority: Medium  . Obesity (BMI 30-39.9) 01/14/2014    Priority: Low  . Lateral epicondylitis (tennis elbow) 01/02/2014    Priority: Low  . GERD (gastroesophageal reflux disease) 03/26/2013    Priority: Low  . LEG CRAMPS 05/16/2009    Priority: Low  . TUBULOVILLOUS ADENOMA, COLON 02/17/2008    Priority: Low  . NEPHROLITHIASIS 02/17/2008    Priority: Low  . MIGRAINE, CLASSICAL W/O INTRACTABLE MIGRAINE 06/09/2007    Priority: Low  . SYMPTOM, MEMORY LOSS 06/09/2007    Priority: Low  . History of total hysterectomy 07/17/2019  . Hypothyroidism 07/17/2019  .  Vertigo 11/06/2016  . Dyspnea 11/02/2014  . Upper airway cough syndrome 11/02/2014    Medications- reviewed and updated Current Outpatient Medications  Medication Sig Dispense Refill  . albuterol (VENTOLIN HFA) 108 (90 Base) MCG/ACT inhaler INHALE 2 PUFFS EVERY 6 HOURS AS NEEDED. 8.5 g 0  . allopurinol (ZYLOPRIM) 100 MG tablet TAKE 1 TABLET BY MOUTH  DAILY 90 tablet 3  . amLODipine (NORVASC) 2.5 MG tablet Take 1 tablet (2.5 mg total) by mouth daily. 90 tablet 1  . baclofen (LIORESAL) 10 MG tablet TAKE 1/2 TO 1 TABLET BY  MOUTH 3 TIMES DAILY (Patient taking differently: as needed. TAKE 1/2 TO 1 TABLET BY  MOUTH 3 TIMES DAILY) 180 tablet 0  . cyanocobalamin (,VITAMIN B-12,) 1000 MCG/ML injection 1000 mcg (1 mg) injection once per per month or as directed 30 mL 1  . diclofenac sodium (VOLTAREN) 1 % GEL Apply 4 g topically 4 (four) times daily. (Patient taking differently: Apply 4 g topically as needed.) 100 g 1  . ELDERBERRY PO Take by mouth.    . escitalopram (LEXAPRO) 20 MG tablet TAKE 1 TABLET BY MOUTH  DAILY 90 tablet 3  . ezetimibe (ZETIA) 10 MG tablet TAKE 1 TABLET BY MOUTH  DAILY 90 tablet 3  . levothyroxine (SYNTHROID, LEVOTHROID) 50 MCG tablet     . oxybutynin (DITROPAN) 5 MG tablet TAKE 1 TABLET BY MOUTH 2  TIMES DAILY AS NEEDED FOR  BLADDER SPASMS. 180 tablet 3  . pantoprazole (PROTONIX) 40 MG tablet TAKE  1 TABLET BY MOUTH  TWICE DAILY 30 TO 60  MINUTES BEFORE MEALS 180 tablet 1  . rosuvastatin (CRESTOR) 40 MG tablet TAKE 1 TABLET BY MOUTH  DAILY 90 tablet 3  . Tiotropium Bromide Monohydrate (SPIRIVA RESPIMAT) 2.5 MCG/ACT AERS Inhale 2 puffs into the lungs daily. 4 g 5  . topiramate (TOPAMAX) 200 MG tablet TAKE 1 TABLET BY MOUTH  DAILY 90 tablet 3  . famotidine (PEPCID) 20 MG tablet Take 1 tablet (20 mg total) by mouth at bedtime. (Patient not taking: Reported on 03/31/2021) 30 tablet 6  . SUPREP BOWEL PREP KIT 17.5-3.13-1.6 GM/177ML SOLN Take 1 kit by mouth as directed. For colonoscopy  prep 354 mL 0   No current facility-administered medications for this visit.     Objective:  BP 124/66   Pulse 64   Temp 98.6 F (37 C) (Temporal)   Ht 5' 2"  (1.575 m)   Wt 159 lb 9.6 oz (72.4 kg)   SpO2 99%   BMI 29.19 kg/m  Gen: NAD, resting comfortably    EKG: Sinus bradycardia rhythm with rate 54, normal axis, normal intervals, no hypertrophy, flattening of T wave in aVF and lead II with inversion in V4 to V6 st or t wave changes-minimal variation from July 06, 2020 EKG    Assessment and Plan   #Chest tightness S: Patient states she has been having chest tightness since she had COVID-19 but recently it has become more significant-notes this with doing her normal tasks around the house-if she stops and rest the symptoms resolved.  Is associated with some shortness of breath.  No fever or chills.  Some lingering cough.  Also may get some palpitations and dizziness with this A/P: 65 year old female with COPD, hyperlipidemia, hypertension and new onset exertional chest tightness since COVID worsening over the last month even as she has gotten further out from [place].  We are going to update labs given chest tightness.  EKG largely stable.  Given exertional component of pain recommended cardiology follow-up-referral was placed today.  I want her to start an aspirin and take it easy until that visit.  If she has progressive symptoms prior to visit she needs to seek care immediately/call 911   Recommended follow up: Return in about 6 months (around 09/30/2021) for follow up- or sooner if needed.  Emergent precautions also given Future Appointments  Date Time Provider [place]  04/07/2021  3:30 PM Irene Shipper, MD [facility]  06/11/2021 10:15 AM [facility] HEALTH COACH [facility]    Lab/Order associations:   ICD-10-CM   1. Chest tightness  R07.89 Ambulatory referral to Cardiology    EKG 12-Lead   Return precautions advised.  Garret Reddish, MD

## 2021-03-31 NOTE — Addendum Note (Signed)
Addended by: Loura Back on: 03/31/2021 03:00 PM   Modules accepted: Orders

## 2021-03-31 NOTE — Patient Instructions (Addendum)
Health Maintenance Due  Topic Date Due  . Prevnar 20- we can give this to you next visit or you can get at your pharmacy Never done  . Zoster Vaccines- Shingrix (1 of 2) Please check with your pharmacy to see if they have the shingrix vaccine. If they do- please get this immunization and update [place] by phone call or mychart with dates you receive the vaccine  Never done  . MAMMOGRAM call [facility] to schedule 07/27/2018  . DEXA SCAN also schedule with [facility]- we can sign for this if needed Never done   Please stop by lab before you go If you have mychart- we will send your results within 3 business days of [place] receiving them.  If you do not have mychart- we will call you about results within 5 business days of [place] receiving them.  *please also note that you will see labs on mychart as soon as they post. I will later go in and write notes on them- will say "notes from Dr. Yong Channel"  Start aspirin 81 mg until you see cardiology  We will call you within two weeks about your referral to cardiology. If you do not hear within 2 weeks, give [place] a call.   If you have worsening symptoms that do not resolve- please seek care immediately with 911  EKG today   Recommended follow up: Return in about 6 months (around 09/30/2021) for follow up- or sooner if needed.  Or certainly sooner if new or worsening symptoms

## 2021-03-31 NOTE — Progress Notes (Signed)
Phone [phone]   Subjective:  Patient presents today for their annual physical. Chief complaint-noted.   See problem oriented charting- ROS- full  review of systems was completed and negative except for: chest tightness, activity change, fatigue, cough, SOB, palpitation/ irregular heart beats, anal bleeding, blood in stool, constipation, diarrhea, rectal pain,dizziness, light-headedness, speech difficulty, decreased concentration.  The following were reviewed and entered/updated in epic: Past Medical History:  Diagnosis Date  . Anemia   . Anxiety   . Arthritis    back, neck  . Cancer ([place])    skin cancer- squamous cell- left arm and shoulder  . Chronic back pain   . Chronic kidney disease    kidney stones  . COPD (chronic obstructive pulmonary disease) ([place])   . Depression   . Fibromyalgia   . GERD (gastroesophageal reflux disease)   . Heart murmur   . Hematuria   . History of adenomatous polyp of colon   . History of kidney stones   . History of seizure    x1 2007 post op (per pt neurologist work-up done ?mixture of anesthesia medication and prozac that pt had taken)  and has not any issues since   . History of squamous cell carcinoma excision   . Hyperlipidemia   . Hypertension   . IC (interstitial cystitis)   . Migraine   . Osteoporosis   . Pulmonary nodules    multiple per ct -- monitored by pcp  . Right ureteral stone   . RLS (restless legs syndrome)   . Seizures ([place])    one time incident- greater than 10 years ago, none since  . Thyroid disease    Patient Active Problem List   Diagnosis Date Noted  . COPD (chronic obstructive pulmonary disease) ([place]) 11/24/2020    Priority: High  . 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 Aortic atherosclerosis ([place]) 03/31/2021    Priority: Medium  . Gout 06/28/2014    Priority: Medium  . Vitamin B12 deficiency 06/28/2014    Priority: Medium  . Multiple  pulmonary nodules 06/28/2014    Priority: Medium  . Essential hypertension 05/17/2014    Priority: Medium  . RESTLESS LEG SYNDROME, SEVERE 03/14/2009    Priority: Medium  . Chronic interstitial cystitis 01/20/2009    Priority: Medium  . Depression, major, recurrent ([place]) 03/19/2008    Priority: Medium  . Hyperlipidemia 02/17/2008    Priority: Medium  . Former smoker 12/21/2007    Priority: Medium  . FIBROMYALGIA 04/21/2007    Priority: Medium  . Obesity (BMI 30-39.9) 01/14/2014    Priority: Low  . Lateral epicondylitis (tennis elbow) 01/02/2014    Priority: Low  . GERD (gastroesophageal reflux disease) 03/26/2013    Priority: Low  . LEG CRAMPS 05/16/2009    Priority: Low  . TUBULOVILLOUS ADENOMA, COLON 02/17/2008    Priority: Low  . NEPHROLITHIASIS 02/17/2008    Priority: Low  . MIGRAINE, CLASSICAL W/O INTRACTABLE MIGRAINE 06/09/2007    Priority: Low  . SYMPTOM, MEMORY LOSS 06/09/2007    Priority: Low  . History of total hysterectomy 07/17/2019  . Hypothyroidism 07/17/2019  . Vertigo 11/06/2016  . Dyspnea 11/02/2014  . Upper airway cough syndrome 11/02/2014   Past Surgical History:  Procedure Laterality Date  . CARDIOVASCULAR STRESS TEST  09-02-2004   normal perfusion study/  normal LV function and wall motion, ef 64%  . CARPAL TUNNEL RELEASE Right   . CERVICAL FUSION  [place]  . COLONOSCOPY    . CYSTO/  BOTOX INJECTION  12-27-2008  &  04-01-2006  . CYSTO/  HYDRODISTENTION/  INSTILLATION THERAPY  03-04-2006  . CYSTOSCOPY W/ URETERAL STENT PLACEMENT Right 04/02/2016   Procedure: CYSTOSCOPY WITH RIGHT RETROGRADE, URETEROSCOPY PYELOGRAM LASER LITHOTRIPSY,/URETERAL STENT PLACEMENT;  Surgeon: Kathie Rhodes, MD;  Location: [facility];  Service: Urology;  Laterality: Right;  . HEMORRHOID SURGERY  01-03-2004  . HOLMIUM LASER APPLICATION Right 10/30/1094   Procedure: HOLMIUM LASER APPLICATION;  Surgeon: Kathie Rhodes, MD;  [facility];  Service: Urology;  Laterality: Right;  . LUMBAR FUSION  1996   L4 --S1  . ROTATOR CUFF REPAIR Left 2004  . SHOULDER ARTHROSCOPY WITH BICEPS TENDON REPAIR Left 03/ 2017  . SPINAL CORD STIMULATOR IMPLANT  x4  last one 2013  . STONE EXTRACTION WITH BASKET Right 04/02/2016   Procedure: STONE EXTRACTION WITH BASKET;  Surgeon: Kathie Rhodes, MD;  Location: [facility];  Service: Urology;  Laterality: Right;  . TONSILLECTOMY  1964  . TRANSTHORACIC ECHOCARDIOGRAM  04-17-2014   EF 60-65%/  trivial AR, MR, and TR  . UPPER GASTROINTESTINAL ENDOSCOPY    . VAGINAL HYSTERECTOMY  1984  . WRIST GANGLION EXCISION Right 02-05-2004    Family History  Problem Relation Age of Onset  . Heart attack Mother        Mom 76, Dad 13  . Hypertension Mother   . Heart disease Mother   . Heart disease Father   . Diabetes Father   . Colon cancer Maternal Grandmother        not sure age of onset  . Esophageal cancer Neg Hx   . Rectal cancer Neg Hx   . Stomach cancer Neg Hx     Medications- reviewed and updated Current Outpatient Medications  Medication Sig Dispense Refill  . albuterol (VENTOLIN HFA) 108 (90 Base) MCG/ACT inhaler INHALE 2 PUFFS EVERY 6 HOURS AS NEEDED. 8.5 g 0  . allopurinol (ZYLOPRIM) 100 MG tablet TAKE 1 TABLET BY MOUTH  DAILY 90 tablet 3  . amLODipine (NORVASC) 2.5 MG tablet Take 1 tablet (2.5 mg total) by mouth daily. 90 tablet 1  . baclofen (LIORESAL) 10 MG tablet TAKE 1/2 TO 1 TABLET BY  MOUTH 3 TIMES DAILY (Patient taking differently: as needed. TAKE 1/2 TO 1 TABLET BY  MOUTH 3 TIMES DAILY) 180 tablet 0  . cyanocobalamin (,VITAMIN B-12,) 1000 MCG/ML injection 1000 mcg (1 mg) injection once per per month or as directed 30 mL 1  . diclofenac sodium (VOLTAREN) 1 % GEL Apply 4 g topically 4 (four) times daily. (Patient taking differently: Apply 4 g topically as needed.) 100 g 1  . ELDERBERRY PO Take by mouth.    . escitalopram (LEXAPRO) 20 MG tablet TAKE 1  TABLET BY MOUTH  DAILY 90 tablet 3  . ezetimibe (ZETIA) 10 MG tablet TAKE 1 TABLET BY MOUTH  DAILY 90 tablet 3  . levothyroxine (SYNTHROID, LEVOTHROID) 50 MCG tablet     . oxybutynin (DITROPAN) 5 MG tablet TAKE 1 TABLET BY MOUTH 2  TIMES DAILY AS NEEDED FOR  BLADDER SPASMS. 180 tablet 3  . pantoprazole (PROTONIX) 40 MG tablet TAKE 1 TABLET BY MOUTH  TWICE DAILY 30 TO 60  MINUTES BEFORE MEALS 180 tablet 1  . rosuvastatin (CRESTOR) 40 MG tablet TAKE 1 TABLET BY MOUTH  DAILY 90 tablet 3  . Tiotropium Bromide Monohydrate (SPIRIVA RESPIMAT) 2.5  MCG/ACT AERS Inhale 2 puffs into the lungs daily. 4 g 5  . topiramate (TOPAMAX) 200 MG tablet TAKE 1 TABLET BY MOUTH  DAILY 90 tablet 3  . famotidine (PEPCID) 20 MG tablet Take 1 tablet (20 mg total) by mouth at bedtime. (Patient not taking: Reported on 03/31/2021) 30 tablet 6  . SUPREP BOWEL PREP KIT 17.5-3.13-1.6 GM/177ML SOLN Take 1 kit by mouth as directed. For colonoscopy prep 354 mL 0   No current facility-administered medications for this visit.    Allergies-reviewed and updated Allergies  Allergen Reactions  . Latex Other (See Comments)    blisters  . Oxycodone Nausea And Vomiting    And sweating  . Zofran [Ondansetron Hcl] Other (See Comments)    headache    Social History   Social History Narrative   Lives with spouse, Antony Haste.    Caffeine use: 1-2 cups coffee per day   Married 47 years in December 2020. 1 daughter. 2 grandkids (boy and girl). Lives 10 miles away.       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       Hobbies: previously enjoyed dancing, old car shoes (43 chevy), reading   Objective  Objective:  BP 124/66   Pulse 64   Temp 98.6 F (37 C) (Temporal)   Ht _0  (1.575 m)   Wt 159 lb 9.6 oz (72.4 kg)   SpO2 99%   BMI 29.19 kg/m  Gen: NAD, resting comfortably HEENT: Mucous membranes are moist. Oropharynx normal, ear canals irritated otherwise TMs normal  Neck: no thyromegaly, no carotid bruits CV: RRR no murmurs  rubs or gallops Lungs: CTAB no crackles, wheeze, rhonchi Abdomen: soft/nontender/nondistended/normal bowel sounds. No rebound or guarding.  Ext: no edema Skin: warm, dry Neuro: grossly normal, moves all extremities, PERRLA Some pain with laying down with chronic back pain   Assessment and Plan   65 y.o. female presenting for annual physical.  Health Maintenance counseling: 1. Anticipatory guidance: Patient counseled regarding regular dental exams -q6 months, eye exams- encouraged to visit ,  avoiding smoking and is surrounded by second hand smoke exposure- encouraged to avoid if possible, limiting alcohol to 1 beverage per day.   2. Risk factor reduction:  Advised patient of need for regular exercise and diet rich and fruits and vegetables to reduce risk of heart attack and stroke. Exercise- has not been exercising due to long run of COVID. Mar[place] Kitchen Diet-discussed working on getting weight back down-recommended to include fruits and vegetables in her diet Wt Readings from Last 3 Encounters:  03/31/21 159 lb 9.6 oz (72.4 kg)  01/26/21 160 lb (72.6 kg)  01/01/21 158 lb (71.7 kg)  3. Immunizations/screenings/ancillary studies -Prevnar 20 consider at future visit or pharmacy-we do not have available today and Shingrix-recommended at pharmacy Immunization History  Administered Date(s) Administered  . Fluad Quad(high Dose 65+) 07/17/2019  . Influenza Whole 08/11/1999, 08/11/2007, 07/25/2009  . Influenza,inj,Quad PF,6+ Mos 09/03/2013, 07/12/2014, 08/04/2015  . Influenza-Unspecified 07/25/2017  . Moderna Sars-Covid-2 Vaccination 11/17/2019, 12/17/2019, 08/28/2020  . Pneumococcal Polysaccharide-23 07/17/2019  . Td 10/25/1998, 10/25/2006  . Tdap 04/20/2018  4. Cervical cancer screening- past age based screening requirements- denies history of abnormal hysterectomy for benign reasons years ago- she was told pap smears not needed- she believes cervix removed. 5. Breast cancer screening- mammogram- pt will  have mammogram scheduled- she is overdue with [facility]- plans to schedule an exam soon with [facility].  Declines breast exam 6. Colon cancer screening - 10/16/19 with 6  month surveillance recommended-she is scheduled for colonoscopy within the next month [facility]-also consider discussed hemorrhoids with them Dr. Henrene Pastor 7. Skin cancer screening- sees derm yearly- Dr. Nevada Crane. advised regular sunscreen use. Denies worrisome, changing, or new skin lesions.  8. Birth control/STD check- postmenopausal/monogamous 9. Osteoporosis screening at 33- screen 07/17/19- pt plans to schedule at [facility]. -former smoker- over 30 pack years- quit 2009. Advised lung cancer screening program- she agreed. Also get urine.  Status of chronic or acute concerns   # Social Updates- She is headed out for a cruise including the [place] in September!  #COVID 19 Updates- She is still having lingering symptoms- mental fog, difficulty sensing smell with certain aromas, SOB, fatigue- has not worsened but is still discouraging- started with exertional chest tightness after COVID-see separate note for discussion on this  # COPD S: Noted on chest x-ray 2016. Patient is using albuterol as needed for wheezing or shortness of breath. In 2022 after COVID ongoing cough/shortness of breath and we opted to try Spiriva as her albuterol usage had drastically increased.Spiriva seems to be beneficial-down to albuterol once or twice a week A/P: has made some mild- moderate imporvements with Spiriva and having to use albuterol less.  Improved control-continue current medication  #Hypertension S: Compliant with amlodipine 2.5 mg BP Readings from Last 3 Encounters:  03/31/21 124/66  01/26/21 138/64  11/24/20 (!) 144/90  A/P: stable. Continue current medications.  # Hyperlipidemia S: compliant with rosuvastatin 58m and Zetia 10 mg- changed to this on 12/05/2018 given elevated LDL from Atorvastatin 20 mg. Lab Results  Component Value Date   CHOL 265  (H) 12/05/2018   HDL 45.90 12/05/2018   LDLCALC 188 (H) 12/05/2018   LDLDIRECT 170.0 07/17/2019   TRIG 159.0 (H) 12/05/2018   CHOLHDL 6 12/05/2018  A/P: has had poor control previously in the past -hoping for significant improvement-lipid panel planned today.  # Restless legs/ Back pain-largely stable-Topamax has been helpful for both   #Gout S: Compliant with allopurinol 100 mg. No gout flares recently- with uric acid under 6 on last check -no flare ups reported recently A/P: Update uric acid with labs today-unlikely to change medication due to no flares  #Depression S: compliant with lexapro 20 mg other than dealing with COVID-19-long COVID symptoms. [facility] 03/31/2021 02/24/2021 11/24/2020 06/05/2020 05/23/2020  Decreased Interest 0 0 0 0 0  Down, Depressed, Hopeless 0 0 0 0 0  PHQ - 2 Score 0 0 0 0 0  Altered sleeping 0 0 0 - 0  Tired, decreased energy 3 0 3 - 0  Change in appetite 0 0 0 - 0  Feeling bad or failure about yourself  0 0 0 - 0  Trouble concentrating 3 0 0 - 0  Moving slowly or fidgety/restless 0 0 0 - 0  Suicidal thoughts 0 0 0 - 0  PHQ-9 Score 6 0 3 - 0  Difficult doing work/chores - - Somewhat difficult - Not difficult at all  Some recent data might be hidden  A/P: PHQ9 of 6 but no anhedonia or depressed mood. In full remission with medicine.  Continue current regimen   #vitamin b12 deficiency S: compliant with b12 10048m injections monthly A/P: hopefully stable- update B12 today. Continue current meds  #overactive bladder S: OAB using oxybutynin sparingly- helpful when used A/P: Continue as needed use of oxybutynin- no reported significant side effects- in the long run may need to consider alternate she continues to age  #Gerd/history  of esophageal stricture S: doing well on protonix 40 mg- twice daily A/P: Good control-I asked patient to discuss with Dr. Henrene Pastor to see if possible to reduce dose at her next visit  # on amitiza through GI  in the past but do not see currently listed  Recommended follow up: Return in about 6 months (around 09/30/2021) for follow up- or sooner if needed. Future Appointments  Date Time Provider [place]  04/07/2021  3:30 PM Irene Shipper, MD [facility]  06/11/2021 10:15 AM [facility] HEALTH COACH [facility]   Lab/Order associations: fasting   ICD-10-CM   1. Preventative health care  Z00.00   2. Vitamin B12 deficiency  E53.8 Vitamin B12  3. Hyperlipidemia, unspecified hyperlipidemia type  E78.5 CBC with Differential/Platelet    Comprehensive metabolic panel    Lipid panel    TSH  4. Idiopathic gout of left foot, unspecified chronicity  M10.072 Uric acid  5. Recurrent major depressive disorder, in full remission ([place]) Chronic F33.42   6. Aortic atherosclerosis ([facility])  I70.0   7. Former smoker  Z87.891 POCT Urinalysis Dipstick (Automated)  8. Chest tightness  R07.89 Ambulatory referral to Cardiology    EKG 12-Lead   I,[name] Phan,acting as a scribe for Garret Reddish, MD.,have documented all relevant documentation on the behalf of Garret Reddish, MD,as directed by  Garret Reddish, MD while in the presence of Garret Reddish, MD.   I, Garret Reddish, MD, have reviewed all documentation for this visit. The documentation on 03/31/21 for the exam, diagnosis, procedures, and orders are all accurate and complete.   Return precautions advised.  Garret Reddish, MD

## 2021-04-01 ENCOUNTER — Other Ambulatory Visit: Payer: Self-pay

## 2021-04-01 DIAGNOSIS — R319 Hematuria, unspecified: Secondary | ICD-10-CM

## 2021-04-01 LAB — COMPREHENSIVE METABOLIC PANEL
ALT: 59 U/L — ABNORMAL HIGH (ref 0–35)
AST: 47 U/L — ABNORMAL HIGH (ref 0–37)
Albumin: 4.3 g/dL (ref 3.5–5.2)
Alkaline Phosphatase: 83 U/L (ref 39–117)
BUN: 17 mg/dL (ref 6–23)
CO2: 22 mEq/L (ref 19–32)
Calcium: 9 mg/dL (ref 8.4–10.5)
Chloride: 110 mEq/L (ref 96–112)
Creatinine, Ser: 0.86 mg/dL (ref 0.40–1.20)
GFR: 69.98 mL/min (ref >60.00)
Glucose, Bld: 74 mg/dL (ref 70–99)
Potassium: 4 mEq/L (ref 3.5–5.1)
Sodium: 140 mEq/L (ref 135–145)
Total Bilirubin: 0.4 mg/dL (ref 0.2–1.2)
Total Protein: 6.9 g/dL (ref 6.0–8.3)

## 2021-04-01 LAB — LIPID PANEL
Cholesterol: 130 mg/dL (ref 0–200)
HDL: 50.7 mg/dL (ref >39.00)
LDL Cholesterol: 64 mg/dL (ref 0–99)
NonHDL: 79.4
Total CHOL/HDL Ratio: 3
Triglycerides: 78 mg/dL (ref 0.0–149.0)
VLDL: 15.6 mg/dL (ref 0.0–40.0)

## 2021-04-01 LAB — URIC ACID: Uric Acid, Serum: 3.1 mg/dL (ref 2.4–7.0)

## 2021-04-02 ENCOUNTER — Encounter: Payer: Self-pay | Admitting: Internal Medicine

## 2021-04-07 ENCOUNTER — Other Ambulatory Visit: Payer: Self-pay

## 2021-04-07 ENCOUNTER — Ambulatory Visit (AMBULATORY_SURGERY_CENTER): Payer: Medicare Other | Admitting: Internal Medicine

## 2021-04-07 ENCOUNTER — Other Ambulatory Visit: Payer: Self-pay | Admitting: Internal Medicine

## 2021-04-07 ENCOUNTER — Other Ambulatory Visit: Payer: Medicare Other

## 2021-04-07 ENCOUNTER — Encounter: Payer: Self-pay | Admitting: Internal Medicine

## 2021-04-07 VITALS — BP 124/69 | HR 72 | Temp 97.5°F | Resp 20 | Ht 62.0 in | Wt 160.0 lb

## 2021-04-07 DIAGNOSIS — Z86010 Personal history of colonic polyps: Secondary | ICD-10-CM

## 2021-04-07 DIAGNOSIS — D123 Benign neoplasm of transverse colon: Secondary | ICD-10-CM

## 2021-04-07 MED ORDER — SODIUM CHLORIDE 0.9 % IV SOLN: 500.0000 mL | Freq: Once | INTRAVENOUS | Status: DC

## 2021-04-07 NOTE — Op Note (Signed)
[facility] Patient Name: Alison Brooks Procedure Date: 04/07/2021 3:37 PM MRN: [id number] Endoscopist: Docia Chuck. Henrene Pastor , MD Age: 65 Referring MD:  Date of Birth: Mar 17, 1956 Gender: Female Account #: [id number] Procedure:                Colonoscopy with biopsy; with snare polypectomy;                            submucosal injection Indications:              High risk colon cancer surveillance: Personal                            history of adenoma (10 mm or greater in size), High                            risk colon cancer surveillance: Personal history of                            multiple (3 or more) adenomas. Examination 2007                            (advanced adenoma); 2014; 2019 with 50 mm lateral                            spreading tumor removed piecemeal and tattooed;                            surveillance March 2020 and December 2020 with                            residual adenoma treated endoscopically. Presents                            now for follow-up. 1 year overdue for recommended                            follow-up. Medicines:                Monitored Anesthesia Care Procedure:                Pre-Anesthesia Assessment:                           - Prior to the procedure, a History and Physical                            was performed, and patient medications and                            allergies were reviewed. The patient's tolerance of                            previous anesthesia was also reviewed. The risks  and benefits of the procedure and the sedation                            options and risks were discussed with the patient.                            All questions were answered, and informed consent                            was obtained. Prior Anticoagulants: The patient has                            taken no previous anticoagulant or antiplatelet                            agents. ASA Grade Assessment: II - A  patient with                            mild systemic disease. After reviewing the risks                            and benefits, the patient was deemed in                            satisfactory condition to undergo the procedure.                           After obtaining informed consent, the colonoscope                            was passed under direct vision. Throughout the                            procedure, the patient's blood pressure, pulse, and                            oxygen saturations were monitored continuously. The                            Olympus PCF-H190DL ([id number]) Colonoscope was                            introduced through the anus and advanced to the the                            cecum, identified by appendiceal orifice and                            ileocecal valve. The ileocecal valve, appendiceal                            orifice, and rectum were photographed. The quality  of the bowel preparation was excellent. The                            colonoscopy was performed without difficulty. The                            patient tolerated the procedure well. The bowel                            preparation used was SUPREP via split dose                            instruction. Scope In: 3:54:28 PM Scope Out: 4:22:33 PM Scope Withdrawal Time: 0 hours 24 minutes 10 seconds  Total Procedure Duration: 0 hours 28 minutes 5 seconds  Findings:                 The previous polypectomy site from the large                            lateral spreading tumor in the region of the distal                            right colon/hepatic flexure was identified due to                            the marking tattoo. In this region there was a                            large bulky multilobulated mass involving 40% of                            the circumference of the colon just proximal                            (upstream toward the cecum) to the  marking tattoo.                            The central portion was friable and umbilicated.                            The lesion would not lift.. This was biopsied with                            a cold forceps for histology. In addition, several                            portions were removed with hot snare polypectomy                            and submitted for pathologic analysis. Additional                            tattooing distal (downstream  toward the rectum) was                            performed                           The exam was otherwise without abnormality on                            direct and retroflexion views. There were external                            hemorrhoid tags. Complications:            No immediate complications. Estimated blood loss:                            None. Estimated Blood Loss:     Estimated blood loss: none. 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                            Biopsied. Partial polypectomy. Submucosal tattooing                           - The examination was otherwise normal on direct                            and retroflexion views.                           - External hemorrhoid tags. Recommendation:           1. Follow-up biopsies                           2. Refer to general surgery for resection                           3. Follow-up biopsies                           4. CEA level today Alison Brooks N. Henrene Pastor, MD 04/07/2021 4:37:13 PM This report has been signed electronically.

## 2021-04-07 NOTE — Patient Instructions (Addendum)
CEA LEVEL TODAY AFTER DISCHARGE, TO LAB IN BASEMENT.   YOU HAD AN ENDOSCOPIC PROCEDURE TODAY AT [facility]:   Refer to the procedure report that was given to you for any specific questions about what was found during the examination.  If the procedure report does not answer your questions, please call your gastroenterologist to clarify.  If you requested that your care partner not be given the details of your procedure findings, then the procedure report has been included in a sealed envelope for you to review at your convenience later.  YOU SHOULD EXPECT: Some feelings of bloating in the abdomen. Passage of more gas than usual.  Walking can help get rid of the air that was put into your GI tract during the procedure and reduce the bloating. If you had a lower endoscopy (such as a colonoscopy or flexible sigmoidoscopy) you may notice spotting of blood in your stool or on the toilet paper. If you underwent a bowel prep for your procedure, you may not have a normal bowel movement for a few days.  Please Note:  You might notice some irritation and congestion in your nose or some drainage.  This is from the oxygen used during your procedure.  There is no need for concern and it should clear up in a day or so.  SYMPTOMS TO REPORT IMMEDIATELY:  Following lower endoscopy (colonoscopy):  Excessive amounts of blood in the stool  Significant tenderness or worsening of abdominal pains  Swelling of the abdomen that is new, acute  Fever of 100F or higher  For urgent or emergent issues, a gastroenterologist can be reached at any hour by calling [phone]. Do not use MyChart messaging for urgent concerns.    DIET:  We do recommend a small meal at first, but then you may proceed to your regular diet.  Drink plenty of fluids but you should avoid alcoholic beverages for 24 hours.  ACTIVITY:  You should plan to take it easy for the rest of today and you should NOT DRIVE, No work or use of  heavy machinery until tomorrow (because of the sedation medicines used during the test).    FOLLOW UP: Our staff will call the number listed on your records 48-72 hours following your procedure to check on you and address any questions or concerns that you may have regarding the information given to you following your procedure. If we do not reach you, we will leave a message.  We will attempt to reach you two times.  During this call, we will ask if you have developed any symptoms of COVID 19. If you develop any symptoms (ie: fever, flu-like symptoms, shortness of breath, cough etc.) before then, please call [phone].  If you test positive for Covid 19 in the 2 weeks post procedure, please call and report this information to [place].    If any biopsies were taken you will be contacted by phone or by letter within the next few days to a week.  Please call us at [phone] if you have not heard about the biopsies in 1 week.   SIGNATURES/CONFIDENTIALITY: You and/or your care partner have signed paperwork which will be entered into your electronic medical record.  These signatures attest to the fact that that the information above on your After Visit Summary has been reviewed and is understood.  Full responsibility of the confidentiality of this discharge information lies with you and/or your care-partner.

## 2021-04-07 NOTE — Progress Notes (Signed)
PT taken to PACU. Monitors in place. VSS. Report given to RN. 

## 2021-04-07 NOTE — Progress Notes (Signed)
Vitals-CW  Pt's states no medical or surgical changes since previsit or office visit. 

## 2021-04-08 ENCOUNTER — Encounter: Payer: Self-pay | Admitting: Internal Medicine

## 2021-04-08 ENCOUNTER — Telehealth: Payer: Self-pay

## 2021-04-08 LAB — CEA: CEA: 1.8 ng/mL

## 2021-04-08 NOTE — Telephone Encounter (Signed)
Referral faxed to [facility] for resection.

## 2021-04-09 ENCOUNTER — Telehealth: Payer: Self-pay

## 2021-04-09 NOTE — Telephone Encounter (Signed)
  Follow up Call-  Call back number 04/07/2021 10/16/2019 01/09/2019 08/29/2018  Post procedure Call Back phone  # [phone]  Permission to leave phone message Yes Yes Yes Yes  Some recent data might be hidden     Patient questions:  Do you have a fever, pain , or abdominal swelling? No. Pain Score  0 *  Have you tolerated food without any problems? Yes.    Have you been able to return to your normal activities? Yes.    Do you have any questions about your discharge instructions: Diet   No. Medications  No. Follow up visit  No.  Do you have questions or concerns about your Care? No.  Actions: * If pain score is 4 or above: No action needed, pain <4.  Have you developed a fever since your procedure? no  2.   Have you had an respiratory symptoms (SOB or cough) since your procedure? no  3.   Have you tested positive for COVID 19 since your procedure no  4.   Have you had any family members/close contacts diagnosed with the COVID 19 since your procedure?  no   If yes to any of these questions please route to Joylene John, RN and Joella Prince, RN

## 2021-04-17 ENCOUNTER — Telehealth: Payer: Self-pay | Admitting: Internal Medicine

## 2021-04-17 NOTE — Telephone Encounter (Signed)
I am forwarding to triage nurse who can assess.  If the issue is constipation, then titrate MiraLAX to achieve desired result

## 2021-04-17 NOTE — Telephone Encounter (Signed)
Inbound call from patient stating she is not able to fully empty bowels and is wanting to know if there is something she can take.  Please advise.

## 2021-04-17 NOTE — Telephone Encounter (Signed)
Left message for pt to call back.  Spoke with pt and she has been constipated. She reports she is only passing small amts of stool. Pt wants to know what she can take. Discussed Dr. Blanch Media recommendation to titrate miralax to achieve desired results. Pt is aware.

## 2021-04-22 ENCOUNTER — Other Ambulatory Visit: Payer: Self-pay | Admitting: Family Medicine

## 2021-04-22 ENCOUNTER — Other Ambulatory Visit: Payer: Self-pay | Admitting: Internal Medicine

## 2021-04-22 DIAGNOSIS — K219 Gastro-esophageal reflux disease without esophagitis: Secondary | ICD-10-CM

## 2021-04-28 ENCOUNTER — Other Ambulatory Visit: Payer: Self-pay | Admitting: Family Medicine

## 2021-04-28 ENCOUNTER — Other Ambulatory Visit: Payer: Self-pay

## 2021-04-28 MED ORDER — FAMOTIDINE 20 MG PO TABS: 20.0000 mg | ORAL_TABLET | Freq: Every day | ORAL | 2 refills | Status: DC

## 2021-05-11 DIAGNOSIS — C44319 Basal cell carcinoma of skin of other parts of face: Secondary | ICD-10-CM | POA: Diagnosis not present

## 2021-05-11 DIAGNOSIS — L820 Inflamed seborrheic keratosis: Secondary | ICD-10-CM | POA: Diagnosis not present

## 2021-05-13 DIAGNOSIS — D122 Benign neoplasm of ascending colon: Secondary | ICD-10-CM | POA: Diagnosis not present

## 2021-05-18 DIAGNOSIS — E038 Other specified hypothyroidism: Secondary | ICD-10-CM | POA: Diagnosis not present

## 2021-05-25 ENCOUNTER — Ambulatory Visit: Payer: Self-pay | Admitting: Surgery

## 2021-05-25 NOTE — Progress Notes (Signed)
Sent message, via epic in basket, requesting orders in epic from surgeon.  

## 2021-06-01 ENCOUNTER — Other Ambulatory Visit: Payer: Self-pay | Admitting: Family Medicine

## 2021-06-01 NOTE — Patient Instructions (Addendum)
DUE TO COVID-19 ONLY ONE VISITOR IS ALLOWED TO COME WITH YOU AND STAY IN THE WAITING ROOM ONLY DURING PRE OP AND PROCEDURE DAY OF SURGERY. THE 1 VISITOR  MAY VISIT WITH YOU AFTER SURGERY IN YOUR PRIVATE ROOM DURING VISITING HOURS ONLY!  YOU NEED TO HAVE A COVID 19 TEST ON: 06/10/21, THIS TEST MUST BE DONE BEFORE SURGERY,  COVID TESTING SITE: [address] [place]. Phone: [phone]. No appointment needed. It's a drive-up. MUST PRESENT REQUISITION FORM               Alison Brooks   Your procedure is scheduled on: 06/12/21   Report to [facility] Main  Entrance    Report to admitting at : 11:30 AM    Call this number if you have problems the morning of surgery [phone]     [place].   DULCOLAX: Take with water the day prior to surgery.   NEOMYCIN: At 2 pm, 3 pm and 10 pm after Miralax  bowel prep the day prior to surgery.   METRONIDAZOLE: At 2 pm, 3 pm and 10 pm after Miralax  bowel prep the day prior to surgery.    MIRALAX: Mix with 64 oz Gatorade/Powerade.  Drink gradually over the next few hours (8 oz glass every 15-30 minutes) until gone the day prior to surgery.    Remember: DRINK 2 PRESURGERY ENSURE DRINKS THE NIGHT BEFORE SURGERY AT  1000 PM AND 1 PRESURGERY DRINK THE DAY OF THE PROCEDURE 3 HOURS PRIOR TO SCHEDULED SURGERY (AT: 10:30 AM). NO SOLIDS AFTER MIDNIGHT THE DAY PRIOR TO THE SURGERY. NOTHING BY MOUTH EXCEPT CLEAR LIQUIDS UNTIL THREE HOURS PRIOR TO SCHEDULED SURGERY. PLEASE FINISH PRESURGERY ENSURE DRINK PER SURGEON ORDER 3 HOURS PRIOR TO SCHEDULED SURGERY TIME WHICH NEEDS TO BE COMPLETED AT: 10:30 AM.   CLEAR LIQUID DIET  Foods Allowed                                                                     Foods Excluded  Coffee and tea, regular and decaf                             liquids that you cannot  Plain Jell-O any favor except red or purple                                           see through  such as: Fruit ices (not with fruit pulp)                                     milk, soups, orange juice  Iced Popsicles                                    All solid food Carbonated beverages, regular and diet  Cranberry, grape and apple juices Sports drinks like Gatorade Lightly seasoned clear broth or consume(fat free) Sugar, honey syrup  Sample Menu Breakfast                                Lunch                                     Supper Cranberry juice                    Beef broth                            Chicken broth Jell-O                                     Grape juice                           Apple juice Coffee or tea                        Jell-O                                      Popsicle                                                Coffee or tea                        Coffee or tea  _____________________________________________________________________   BRUSH YOUR TEETH MORNING OF SURGERY AND RINSE YOUR MOUTH OUT, NO CHEWING GUM CANDY OR MINTS.    Take these medicines the morning of surgery with A SIP OF WATER: Topiramate (Topamax) ,Escitalopram (lexapro), Amlodipine, Pantoprazole (Protonix), Levothyroxine, Ezetimibe, Rosuvastatin.  Use Spiriva inhalers as usual.                               You may not have any metal on your body including hair pins and              piercings  Do not wear jewelry, make-up, lotions, powders or perfumes, deodorant             Do not wear nail polish on your fingernails.  Do not shave  48 hours prior to surgery.    Do not bring valuables to the hospital. [place].   Contacts, dentures or bridgework may not be worn into surgery.   Leave suitcase in the car. After surgery it may be brought to your room.              Please read over the following fact sheets you were given: _____________________________________________________________________            [facility] - Preparing  for Surgery Before surgery, you can play an important role.  Because skin is not sterile, your skin needs to be as free of germs as possible.  You can reduce the number of germs on your skin by washing with CHG (chlorahexidine gluconate) soap before surgery.  CHG is an antiseptic cleaner which kills germs and bonds with the skin to continue killing germs even after washing. Please DO NOT use if you have an allergy to CHG or antibacterial soaps.  If your skin becomes reddened/irritated stop using the CHG and inform your nurse when you arrive at Short Stay. Do not shave (including legs and underarms) for at least 48 hours prior to the first CHG shower.  You may shave your face/neck. Please follow these instructions carefully:  1.  Shower with CHG Soap the night before surgery and the  morning of Surgery.  2.  If you choose to wash your hair, wash your hair first as usual with your  normal  shampoo.  3.  After you shampoo, rinse your hair and body thoroughly to remove the  shampoo.                           4.  Use CHG as you would any other liquid soap.  You can apply chg directly  to the skin and wash                       Gently with a scrungie or clean washcloth.  5.  Apply the CHG Soap to your body ONLY FROM THE NECK DOWN.   Do not use on face/ open                           Wound or open sores. Avoid contact with eyes, ears mouth and genitals (private parts).                       Wash face,  Genitals (private parts) with your normal soap.             6.  Wash thoroughly, paying special attention to the area where your surgery  will be performed.  7.  Thoroughly rinse your body with warm water from the neck down.  8.  DO NOT shower/wash with your normal soap after using and rinsing off  the CHG Soap.                9.  Pat yourself dry with a clean towel.            10.  Wear clean pajamas.            11.  Place clean sheets on your bed the night of your first shower  and do not  sleep with pets. Day of Surgery : Do not apply any lotions/deodorants the morning of surgery.  Please wear clean clothes to the hospital/surgery center.  FAILURE TO FOLLOW THESE INSTRUCTIONS MAY RESULT IN THE CANCELLATION OF YOUR SURGERY PATIENT SIGNATURE_________________________________  NURSE SIGNATURE__________________________________  ________________________________________________________________________   Alison Brooks  An incentive spirometer is a tool that can help keep your lungs clear and active. This tool measures how well you are filling your lungs with each breath. Taking long deep breaths may help reverse or decrease the chance of developing breathing (pulmonary) problems (especially infection) following: A long period of time when you are unable to  move or be active. BEFORE THE PROCEDURE  If the spirometer includes an indicator to show your best effort, your nurse or respiratory therapist will set it to a desired goal. If possible, sit up straight or lean slightly forward. Try not to slouch. Hold the incentive spirometer in an upright position. INSTRUCTIONS FOR USE  Sit on the edge of your bed if possible, or sit up as far as you can in bed or on a chair. Hold the incentive spirometer in an upright position. Breathe out normally. Place the mouthpiece in your mouth and seal your lips tightly around it. Breathe in slowly and as deeply as possible, raising the piston or the ball toward the top of the column. Hold your breath for 3-5 seconds or for as long as possible. Allow the piston or ball to fall to the bottom of the column. Remove the mouthpiece from your mouth and breathe out normally. Rest for a few seconds and repeat Steps 1 through 7 at least 10 times every 1-2 hours when you are awake. Take your time and take a few normal breaths between deep breaths. The spirometer may include an indicator to show your best effort. Use the indicator as a goal to  work toward during each repetition. After each set of 10 deep breaths, practice coughing to be sure your lungs are clear. If you have an incision (the cut made at the time of surgery), support your incision when coughing by placing a pillow or rolled up towels firmly against it. Once you are able to get out of bed, walk around indoors and cough well. You may stop using the incentive spirometer when instructed by your caregiver.  RISKS AND COMPLICATIONS Take your time so you do not get dizzy or light-headed. If you are in pain, you may need to take or ask for pain medication before doing incentive spirometry. It is harder to take a deep breath if you are having pain. AFTER USE Rest and breathe slowly and easily. It can be helpful to keep track of a log of your progress. Your caregiver can provide you with a simple table to help with this. If you are using the spirometer at home, follow these instructions: [place] IF:  You are having difficultly using the spirometer. You have trouble using the spirometer as often as instructed. Your pain medication is not giving enough relief while using the spirometer. You develop fever of 100.5 F (38.1 C) or higher. SEEK IMMEDIATE MEDICAL CARE IF:  You cough up bloody sputum that had not been present before. You develop fever of 102 F (38.9 C) or greater. You develop worsening pain at or near the incision site. MAKE SURE YOU:  Understand these instructions. Will watch your condition. Will get help right away if you are not doing well or get worse. Document Released: 02/21/2007 Document Revised: 01/03/2012 Document Reviewed: 04/24/2007 [facility] Patient Information 2014 [employer], [place].   ________________________________________________________________________    ________________________________________________________________________

## 2021-06-02 ENCOUNTER — Encounter (HOSPITAL_COMMUNITY): Payer: Self-pay

## 2021-06-02 ENCOUNTER — Encounter (HOSPITAL_COMMUNITY)
Admission: RE | Admit: 2021-06-02 | Discharge: 2021-06-02 | Disposition: A | Discharge status: Home / Self Care | Payer: Medicare Other | Source: Ambulatory Visit | Attending: Surgery | Admitting: Surgery

## 2021-06-02 ENCOUNTER — Other Ambulatory Visit: Payer: Self-pay

## 2021-06-02 DIAGNOSIS — Z01812 Encounter for preprocedural laboratory examination: Secondary | ICD-10-CM | POA: Diagnosis not present

## 2021-06-02 HISTORY — DX: Pneumonia, unspecified organism: J18.9

## 2021-06-02 HISTORY — DX: Hypothyroidism, unspecified: E03.9

## 2021-06-02 HISTORY — DX: Dyspnea, unspecified: R06.00

## 2021-06-02 HISTORY — DX: Post covid-19 condition, unspecified: U09.9

## 2021-06-02 HISTORY — DX: Fatty (change of) liver, not elsewhere classified: K76.0

## 2021-06-02 LAB — COMPREHENSIVE METABOLIC PANEL
ALT: 35 U/L (ref 0–44)
AST: 27 U/L (ref 15–41)
Albumin: 4.2 g/dL (ref 3.5–5.0)
Alkaline Phosphatase: 82 U/L (ref 38–126)
Anion gap: 6 (ref 5–15)
BUN: 15 mg/dL (ref 8–23)
CO2: 24 mmol/L (ref 22–32)
Calcium: 8.9 mg/dL (ref 8.9–10.3)
Chloride: 111 mmol/L (ref 98–111)
Creatinine, Ser: 0.74 mg/dL (ref 0.44–1.00)
GFR, Estimated: >60 mL/min (ref >60)
Glucose, Bld: 93 mg/dL (ref 70–99)
Potassium: 4.2 mmol/L (ref 3.5–5.1)
Sodium: 141 mmol/L (ref 135–145)
Total Bilirubin: 0.6 mg/dL (ref 0.3–1.2)
Total Protein: 6.9 g/dL (ref 6.5–8.1)

## 2021-06-02 LAB — PROTIME-INR
INR: 1 (ref 0.8–1.2)
Prothrombin Time: 12.9 seconds (ref 11.4–15.2)

## 2021-06-02 LAB — CBC WITH DIFFERENTIAL/PLATELET
Abs Immature Granulocytes: 0.03 10*3/uL (ref 0.00–0.07)
Basophils Absolute: 0.1 10*3/uL (ref 0.0–0.1)
Basophils Relative: 1 %
Eosinophils Absolute: 0.1 10*3/uL (ref 0.0–0.5)
Eosinophils Relative: 2 %
HCT: 43.8 % (ref 36.0–46.0)
Hemoglobin: 14.1 g/dL (ref 12.0–15.0)
Immature Granulocytes: 0 %
Lymphocytes Relative: 34 %
Lymphs Abs: 2.3 10*3/uL (ref 0.7–4.0)
MCH: 31.5 pg (ref 26.0–34.0)
MCHC: 32.2 g/dL (ref 30.0–36.0)
MCV: 98 fL (ref 80.0–100.0)
Monocytes Absolute: 0.5 10*3/uL (ref 0.1–1.0)
Monocytes Relative: 7 %
Neutro Abs: 3.9 10*3/uL (ref 1.7–7.7)
Neutrophils Relative %: 56 %
Platelets: 189 10*3/uL (ref 150–400)
RBC: 4.47 MIL/uL (ref 3.87–5.11)
RDW: 13.5 % (ref 11.5–15.5)
WBC: 6.9 10*3/uL (ref 4.0–10.5)
nRBC: 0 % (ref 0.0–0.2)

## 2021-06-02 LAB — HEMOGLOBIN A1C
Hgb A1c MFr Bld: 5.7 % — ABNORMAL HIGH (ref 4.8–5.6)
Mean Plasma Glucose: 116.89 mg/dL

## 2021-06-02 NOTE — Progress Notes (Addendum)
COVID Vaccine Completed: Yes Date COVID Vaccine completed: x2 Has received booster: x1 COVID vaccine manufacturer:   Moderna    Date of COVID positive in last 90 days: No  PCP - [name], MD Cardiologist - Dr. Dorris Carnes , patient is scheduled or an appointment on 06/03/2021 Pulmonologist-Dr. Christinia Gully  last office visit 11/20/2019 with Otho Najjar NP in epic   Chest x-ray - 11/12/2020 in epic EKG - 03/31/2021 in epic Stress Test - greater than 2 years in epic ECHO - greater than 2 years in epic Cardiac Cath - N/A Pacemaker/ICD device last checked:N/A  Sleep Study - N/A CPAP - N/A  Fasting Blood Sugar - N/A Checks Blood Sugar ___N/A__ times a day  Blood Thinner Instructions:N/A Aspirin Instructions:N/A Last Dose:N/A  Activity level:  Unable to go up a flight of stairs without symptoms       Anesthesia review: HTN, CKD, COPD  Patient denies shortness of breath, fever, cough and chest pain at PAT appointment   Patient verbalized understanding of instructions that were given to them at the PAT appointment. Patient was also instructed that they will need to review over the PAT instructions again at home before surgery.

## 2021-06-03 ENCOUNTER — Encounter: Payer: Self-pay | Admitting: Internal Medicine

## 2021-06-03 ENCOUNTER — Ambulatory Visit: Payer: Medicare Other | Admitting: Internal Medicine

## 2021-06-03 ENCOUNTER — Encounter: Payer: Self-pay | Admitting: *Deleted

## 2021-06-03 VITALS — BP 110/60 | HR 60 | Ht 62.0 in | Wt 157.0 lb

## 2021-06-03 DIAGNOSIS — R0602 Shortness of breath: Secondary | ICD-10-CM

## 2021-06-03 NOTE — Progress Notes (Signed)
HPI: Pt is a 65 yo with hx of palpitations, SOB, chest heavines in past.   Exercise treadmill back in 2015 was without ischemia   She was last seen in cardiology by Ezzie Dural.  Has not been seen since.    The pt is followed by Ansel Bong. She presents for follow up  She says she got COVID in Jan 2022  SInce that time she has been SOB with activity    (was not prior)   She says that walking in from parking lot she got SOB   Denies wheezing   Does note some chest discomfort at times   Like something is "crawling" in her chest    She says since the winter she has cut back on her activities            Outpatient Encounter Medications as of 06/03/2021  Medication Sig   albuterol (VENTOLIN HFA) 108 (90 Base) MCG/ACT inhaler INHALE 2 PUFFS EVERY 6 HOURS AS NEEDED.   allopurinol (ZYLOPRIM) 100 MG tablet TAKE 1 TABLET BY MOUTH  DAILY   amLODipine (NORVASC) 2.5 MG tablet TAKE 1 TABLET BY MOUTH  DAILY   aspirin EC 81 MG tablet Take 81 mg by mouth daily. Swallow whole.   baclofen (LIORESAL) 10 MG tablet TAKE 1/2 TO 1 TABLET BY  MOUTH 3 TIMES DAILY   cyanocobalamin (,VITAMIN B-12,) 1000 MCG/ML injection 1000 mcg (1 mg) injection once per per month or as directed   diclofenac sodium (VOLTAREN) 1 % GEL Apply 4 g topically 4 (four) times daily.   ELDERBERRY PO Take by mouth.   escitalopram (LEXAPRO) 20 MG tablet TAKE 1 TABLET BY MOUTH  DAILY   ezetimibe (ZETIA) 10 MG tablet TAKE 1 TABLET BY MOUTH  DAILY   famotidine (PEPCID) 20 MG tablet Take 1 tablet (20 mg total) by mouth at bedtime.   levothyroxine (SYNTHROID, LEVOTHROID) 50 MCG tablet Take 50 mcg by mouth daily before breakfast.   oxybutynin (DITROPAN) 5 MG tablet TAKE 1 TABLET BY MOUTH 2  TIMES DAILY AS NEEDED FOR  BLADDER SPASMS. (Patient taking differently: TAKE 1 TABLET BY MOUTH 2  TIMES DAILY AS NEEDED FOR  BLADDER SPASMS.)   pantoprazole (PROTONIX) 40 MG tablet TAKE 1 TABLET BY MOUTH  TWICE DAILY 30 TO 60  MINUTES BEFORE MEALS (Patient taking  differently: TAKE 1 TABLET BY MOUTH  TWICE DAILY 30 TO 60  MINUTES BEFORE MEALS)   rosuvastatin (CRESTOR) 40 MG tablet TAKE 1 TABLET BY MOUTH  DAILY   Tiotropium Bromide Monohydrate (SPIRIVA RESPIMAT) 2.5 MCG/ACT AERS Inhale 2 puffs into the lungs daily.   topiramate (TOPAMAX) 200 MG tablet TAKE 1 TABLET BY MOUTH  DAILY   [DISCONTINUED] orlistat (XENICAL) 120 MG capsule Take 1 capsule (120 mg total) by mouth 3 (three) times daily with meals.   No facility-administered encounter medications on file as of 06/03/2021.    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   Chronic back pain    Chronic kidney disease    kidney stones   COPD (chronic obstructive pulmonary disease) ([place])  Depression    Dyspnea    Since covid , NOW ON INHALERS   Fatty liver    Fibromyalgia    GERD (gastroesophageal reflux disease)    Heart murmur    Hematuria    History of adenomatous polyp of colon    History of COVID-19 10/2020   History of kidney stones    History of seizure    x1 2007 post op (per pt neurologist work-up done ?mixture of anesthesia medication and prozac that pt had taken)  and has not any issues since    History of squamous cell carcinoma excision    Hyperlipidemia    Hypertension    Hypothyroidism    IC (interstitial cystitis)    Long COVID    Migraine    Osteoporosis    Pneumonia    history of   Pulmonary nodules    multiple per ct -- monitored by pcp   Right ureteral stone    RLS (restless legs syndrome)    Seizures ([place])    one time incident- greater than 10 years ago, none since, mixture of medication and anesthesia   Thyroid disease    Family history: Patient's bother and  mother both died suddenly at age of 63 and 73 respectively. Both presumably related to heart attacks.  ROS: Negative except as per HPI  BP 110/60 (BP Location: Left Arm, Patient Position: Sitting, Cuff Size: Normal)   Pulse 60   Ht '5\' 2"'$  (1.575 m)   Wt 157 lb (71.2 kg)   SpO2 98%   BMI 28.72 kg/m   PHYSICAL EXAM: Pt is alert and oriented, NAD HEENT: normal Neck: JVP is not elevated Lungs:  CTA bilateralally CV: RRR without murmur or gallop Abd: soft, NT, Positive BS, no hepatomegaly Ext: no C/C/E, distal pulses intact and equal Skin: warm/dry no rash  EKG:    ASSESSMENT AND PLAN:  1  Dyspnea.  She is moving air well on exam No wheezes   CT of chest in May 2022 showed atherosclerosis of aorta and coronary arteries With this and some chest pressure will set up for an Lexiscan myoview, esp with complaints and upcoming surgery; r/o large are of ischemia.   Will also set up  for an echo to eval LVEF 2.  Chest discomfort   As above   Set up for Lexiscan myoview 3. Essential hypertension.BP is controlled  Follow  4 Heart palpitations.No significant complaints    For followup I will see her back in one year. She will call if blood pressure readings are elevated at home. Her cuff has been calibrated  Dorris Carnes 06/03/2021 2:54 PM

## 2021-06-03 NOTE — Patient Instructions (Signed)
Medication Instructions:  Your physician recommends that you continue on your current medications as directed. Please refer to the Current Medication list given to you today.  *If you need a refill on your cardiac medications before your next appointment, please call your pharmacy*   Lab Work: NONE   If you have labs (blood work) drawn today and your tests are completely normal, you will receive your results only by: [place] (if you have MyChart) OR A paper copy in the mail If you have any lab test that is abnormal or we need to change your treatment, we will call you to review the results.   Testing/Procedures: Your physician has requested that you have a lexiscan myoview. For further information please visit [email or web address]. Please follow instruction sheet, as given.  Your physician has requested that you have an echocardiogram. Echocardiography is a painless test that uses sound waves to create images of your heart. It provides your doctor with information about the size and shape of your heart and how well your heart's chambers and valves are working. This procedure takes approximately one hour. There are no restrictions for this procedure.   Follow-Up: At [facility], you and your health needs are our priority.  As part of our continuing mission to provide you with exceptional heart care, we have created designated Provider Care Teams.  These Care Teams include your primary Cardiologist (physician) and Advanced Practice Providers (APPs -  Physician Assistants and Nurse Practitioners) who all work together to provide you with the care you need, when you need it.  We recommend signing up for the patient portal called "MyChart".  Sign up information is provided on this After Visit Summary.  MyChart is used to connect with patients for Virtual Visits (Telemedicine).  Patients are able to view lab/test results, encounter notes, upcoming appointments, etc.  Non-urgent messages  can be sent to your provider as well.   To learn more about what you can do with MyChart, go to [email or web address].    Your next appointment:   6 month(s)  The format for your next appointment:   In Person  Provider:   Dorris Carnes, MD   Other Instructions Thank you for choosing [facility]!

## 2021-06-08 ENCOUNTER — Ambulatory Visit (HOSPITAL_COMMUNITY)
Admission: RE | Admit: 2021-06-08 | Discharge: 2021-06-08 | Disposition: A | Discharge status: Home / Self Care | Payer: Medicare Other | Source: Ambulatory Visit | Attending: Internal Medicine | Admitting: Internal Medicine

## 2021-06-08 ENCOUNTER — Other Ambulatory Visit: Payer: Self-pay

## 2021-06-08 DIAGNOSIS — R0602 Shortness of breath: Secondary | ICD-10-CM | POA: Diagnosis not present

## 2021-06-08 DIAGNOSIS — Z01818 Encounter for other preprocedural examination: Secondary | ICD-10-CM | POA: Insufficient documentation

## 2021-06-08 DIAGNOSIS — R0609 Other forms of dyspnea: Secondary | ICD-10-CM | POA: Diagnosis not present

## 2021-06-08 LAB — NM MYOCAR MULTI W/SPECT W/WALL MOTION / EF
LV dias vol: 41 mL (ref 46–106)
LV sys vol: 20 mL
Peak HR: 88 {beats}/min
RATE: 0.54
Rest HR: 52 {beats}/min
SDS: 1
SRS: 3
SSS: 4
TID: 1.09

## 2021-06-08 MED ORDER — TECHNETIUM TC 99M TETROFOSMIN IV KIT
10.0000 | PACK | Freq: Once | INTRAVENOUS | Status: AC | PRN
  Administered 2021-06-08: 11 via INTRAVENOUS

## 2021-06-08 MED ORDER — REGADENOSON 0.4 MG/5ML IV SOLN
INTRAVENOUS | Status: AC
  Administered 2021-06-08: 0.4 mg via INTRAVENOUS
  Filled 2021-06-08: qty 5

## 2021-06-08 MED ORDER — TECHNETIUM TC 99M TETROFOSMIN IV KIT
30.0000 | PACK | Freq: Once | INTRAVENOUS | Status: AC | PRN
  Administered 2021-06-08: 30 via INTRAVENOUS

## 2021-06-08 MED ORDER — SODIUM CHLORIDE FLUSH 0.9 % IV SOLN
INTRAVENOUS | Status: AC
  Administered 2021-06-08: 10 mL via INTRAVENOUS
  Filled 2021-06-08: qty 10

## 2021-06-09 ENCOUNTER — Other Ambulatory Visit: Payer: Self-pay | Admitting: Surgery

## 2021-06-09 ENCOUNTER — Telehealth: Payer: Self-pay

## 2021-06-09 ENCOUNTER — Ambulatory Visit (HOSPITAL_BASED_OUTPATIENT_CLINIC_OR_DEPARTMENT_OTHER)
Admission: RE | Admit: 2021-06-09 | Discharge: 2021-06-09 | Disposition: A | Discharge status: Home / Self Care | Payer: Medicare Other | Source: Ambulatory Visit | Attending: Internal Medicine | Admitting: Internal Medicine

## 2021-06-09 DIAGNOSIS — K219 Gastro-esophageal reflux disease without esophagitis: Secondary | ICD-10-CM | POA: Diagnosis not present

## 2021-06-09 DIAGNOSIS — Z888 Allergy status to other drugs, medicaments and biological substances status: Secondary | ICD-10-CM | POA: Diagnosis not present

## 2021-06-09 DIAGNOSIS — Z885 Allergy status to narcotic agent status: Secondary | ICD-10-CM | POA: Diagnosis not present

## 2021-06-09 DIAGNOSIS — C481 Malignant neoplasm of specified parts of peritoneum: Secondary | ICD-10-CM | POA: Diagnosis not present

## 2021-06-09 DIAGNOSIS — E039 Hypothyroidism, unspecified: Secondary | ICD-10-CM | POA: Diagnosis not present

## 2021-06-09 DIAGNOSIS — I351 Nonrheumatic aortic (valve) insufficiency: Secondary | ICD-10-CM | POA: Insufficient documentation

## 2021-06-09 DIAGNOSIS — J449 Chronic obstructive pulmonary disease, unspecified: Secondary | ICD-10-CM | POA: Diagnosis not present

## 2021-06-09 DIAGNOSIS — Z800 Family history of malignant neoplasm of digestive organs: Secondary | ICD-10-CM | POA: Diagnosis not present

## 2021-06-09 DIAGNOSIS — E785 Hyperlipidemia, unspecified: Secondary | ICD-10-CM | POA: Diagnosis not present

## 2021-06-09 DIAGNOSIS — F32A Depression, unspecified: Secondary | ICD-10-CM | POA: Diagnosis not present

## 2021-06-09 DIAGNOSIS — R0602 Shortness of breath: Secondary | ICD-10-CM

## 2021-06-09 DIAGNOSIS — I10 Essential (primary) hypertension: Secondary | ICD-10-CM | POA: Diagnosis not present

## 2021-06-09 DIAGNOSIS — M810 Age-related osteoporosis without current pathological fracture: Secondary | ICD-10-CM | POA: Diagnosis not present

## 2021-06-09 DIAGNOSIS — G2581 Restless legs syndrome: Secondary | ICD-10-CM | POA: Diagnosis not present

## 2021-06-09 DIAGNOSIS — M797 Fibromyalgia: Secondary | ICD-10-CM | POA: Diagnosis not present

## 2021-06-09 DIAGNOSIS — Z85828 Personal history of other malignant neoplasm of skin: Secondary | ICD-10-CM | POA: Diagnosis not present

## 2021-06-09 DIAGNOSIS — M109 Gout, unspecified: Secondary | ICD-10-CM | POA: Diagnosis not present

## 2021-06-09 DIAGNOSIS — Z87891 Personal history of nicotine dependence: Secondary | ICD-10-CM | POA: Diagnosis not present

## 2021-06-09 DIAGNOSIS — Z981 Arthrodesis status: Secondary | ICD-10-CM | POA: Diagnosis not present

## 2021-06-09 DIAGNOSIS — K635 Polyp of colon: Secondary | ICD-10-CM | POA: Diagnosis not present

## 2021-06-09 DIAGNOSIS — D122 Benign neoplasm of ascending colon: Secondary | ICD-10-CM | POA: Diagnosis not present

## 2021-06-09 DIAGNOSIS — Z01818 Encounter for other preprocedural examination: Secondary | ICD-10-CM | POA: Insufficient documentation

## 2021-06-09 DIAGNOSIS — Z87442 Personal history of urinary calculi: Secondary | ICD-10-CM | POA: Diagnosis not present

## 2021-06-09 DIAGNOSIS — Z91040 Latex allergy status: Secondary | ICD-10-CM | POA: Diagnosis not present

## 2021-06-09 DIAGNOSIS — Z8249 Family history of ischemic heart disease and other diseases of the circulatory system: Secondary | ICD-10-CM | POA: Diagnosis not present

## 2021-06-09 DIAGNOSIS — Z0000 Encounter for general adult medical examination without abnormal findings: Secondary | ICD-10-CM | POA: Diagnosis not present

## 2021-06-09 DIAGNOSIS — K660 Peritoneal adhesions (postprocedural) (postinfection): Secondary | ICD-10-CM | POA: Diagnosis not present

## 2021-06-09 DIAGNOSIS — C182 Malignant neoplasm of ascending colon: Secondary | ICD-10-CM | POA: Diagnosis not present

## 2021-06-09 DIAGNOSIS — K921 Melena: Secondary | ICD-10-CM | POA: Diagnosis not present

## 2021-06-09 DIAGNOSIS — Z8616 Personal history of COVID-19: Secondary | ICD-10-CM | POA: Diagnosis not present

## 2021-06-09 LAB — ECHOCARDIOGRAM COMPLETE
AR max vel: 2.57 cm2
AV Area VTI: 2.48 cm2
AV Area mean vel: 2.59 cm2
AV Mean grad: 2 mmHg
AV Peak grad: 4 mmHg
Ao pk vel: 1 m/s
Area-P 1/2: 2.73 cm2
P 1/2 time: 618 msec
S' Lateral: 2.4 cm

## 2021-06-09 NOTE — Telephone Encounter (Signed)
Pt notified of results and verbalized understanding. Pt had no questions or concerns at this time.

## 2021-06-09 NOTE — Telephone Encounter (Signed)
Pt called stating that she needs her physical faxed over to Dr Kathlene Cote in [place] stated that he needs her physical in order to fill her medications. Please Advise.

## 2021-06-09 NOTE — Progress Notes (Signed)
  Echocardiogram 2D Echocardiogram has been performed.  Merrie Roof F 06/09/2021, 3:34 PM

## 2021-06-09 NOTE — Telephone Encounter (Signed)
CPE and labs have been faxed, tried calling and making pt aware but the number was not working.

## 2021-06-09 NOTE — Telephone Encounter (Signed)
-----   Message from [place], MD sent at 06/08/2021  7:09 PM EDT ----- Stress test looks OK  NO ischemia   LVEF 51% It does not appear that SOB is due to blood supply problem with heart Low risk from cardiac standpoint for major cardiac event with surgery

## 2021-06-10 LAB — SARS CORONAVIRUS 2 (TAT 6-24 HRS): SARS Coronavirus 2: NEGATIVE

## 2021-06-10 NOTE — Progress Notes (Signed)
Anesthesia Chart Review:   Case: [id number] Date/Time: 06/12/21 1315   Procedure: LAPAROSCOPIC RIGHT HEMI COLECTOMY   Anesthesia type: General   Pre-op diagnosis: HEPATIC FLEXURE COLON POLYP   Location: [facility] ROOM 02 / [facility]   Surgeons: Ileana Roup, MD       DISCUSSION: Pt is 65 years old with hx HTN, COPD, fatty liver, anemia  VS: BP (!) 128/55   Pulse (!) 51   Temp 36.8 C (Oral)   Resp 16   Ht '5\' 2"'$  (1.575 m)   Wt 70 kg Comment: Simultaneous filing. User may not have seen previous data.  SpO2 99%   BMI 28.23 kg/m   PROVIDERS: - PCP is [name], MD - Cardiologist is Dorris Carnes, MD. Last office visit 06/03/21. Cleared pt for surgery in comment on stress test results.    LABS: Labs reviewed: Acceptable for surgery. (all labs ordered are listed, but only abnormal results are displayed)  Labs Reviewed  HEMOGLOBIN A1C - Abnormal; Notable for the following components:      Result Value   Hgb A1c MFr Bld 5.7 (*)    All other components within normal limits  CBC WITH DIFFERENTIAL/PLATELET  COMPREHENSIVE METABOLIC PANEL  PROTIME-INR  TYPE AND SCREEN     IMAGES: CT chest lung cancer screening 03/10/21:  1. Lung-RADS 2, benign appearance or behavior. Continue annual screening with low-dose chest CT without contrast in 12 months. 2. Bilateral adrenal adenomas. 3. Aortic atherosclerosis (ICD10-I70.0). Coronary artery calcification. 4.  Emphysema   CXR 11/12/20: Spinal stimulator device noted. No evidence of pneumonia   EKG 03/31/21:Sinus  Bradycardia.  Nonspecific T-abnormality.    CV: Echo 06/09/21 1. Left ventricular ejection fraction by 3D volume is 59 %. The left ventricle has normal function. The left ventricle has no regional wall motion abnormalities. Left ventricular diastolic parameters were normal.   2. Right ventricular systolic function is normal. The right ventricular size is normal.   3. The mitral valve is grossly normal. Trivial mitral valve  regurgitation.   4. The aortic valve is grossly normal. Aortic valve regurgitation is mild to moderate. No aortic stenosis is present.   Nuclear stress test 06/08/21:  No diagnostic ST segment changes to indicate ischemia. No significant myocardial perfusion defects to indicate scar or ischemia. This is a low risk study. 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   Chronic back pain    Chronic kidney disease    kidney stones   COPD (chronic obstructive pulmonary disease) (HCC)    Depression    Dyspnea    Since covid , NOW ON INHALERS   Fatty liver    Fibromyalgia    GERD (gastroesophageal reflux disease)    Heart murmur    Hematuria    History of adenomatous polyp of colon    History of COVID-19 10/2020   History of kidney stones    History of seizure    x1 2007 post op (per pt neurologist work-up done ?mixture of anesthesia medication and prozac that pt had taken)  and has not any issues since    History of squamous cell carcinoma excision    Hyperlipidemia    Hypertension    Hypothyroidism    IC (interstitial cystitis)    Long COVID  Migraine    Osteoporosis    Pneumonia    history of   Pulmonary nodules    multiple per ct -- monitored by pcp   Right ureteral stone    RLS (restless legs syndrome)    Seizures ([place])    one time incident- greater than 10 years ago, none since, mixture of medication and anesthesia   Thyroid disease     Past Surgical History:  Procedure Laterality Date   CARDIOVASCULAR STRESS TEST  09/02/2004   normal perfusion study/  normal LV function and wall motion, ef 64%   CARPAL TUNNEL RELEASE Right    CERVICAL FUSION  1992   CHOLECYSTECTOMY  1995   COLONOSCOPY     CYSTO/  BOTOX INJECTION  12-27-2008  &  04-01-2006   CYSTO/  HYDRODISTENTION/  INSTILLATION THERAPY  03/04/2006   CYSTOSCOPY W/ URETERAL  STENT PLACEMENT Right 04/02/2016   Procedure: CYSTOSCOPY WITH RIGHT RETROGRADE, URETEROSCOPY PYELOGRAM LASER [place];  Surgeon: Kathie Rhodes, MD;  Location: [facility];  Service: Urology;  Laterality: Right;   HEMORRHOID SURGERY  01/03/2004   HOLMIUM LASER APPLICATION Right [id number]   Procedure: HOLMIUM LASER APPLICATION;  Surgeon: Kathie Rhodes, MD;  Location: [facility];  Service: Urology;  Laterality: Right;   LUMBAR DISC SURGERY     LUMBAR FUSION  1996   L4 --S1   ROTATOR CUFF REPAIR Left 2004   SHOULDER ARTHROSCOPY WITH BICEPS TENDON REPAIR Left 12/2015   SPINAL CORD STIMULATOR IMPLANT  x4  last one 2013   STONE EXTRACTION WITH BASKET Right 04/02/2016   Procedure: STONE EXTRACTION WITH BASKET;  Surgeon: Kathie Rhodes, MD;  Location: [facility];  Service: Urology;  Laterality: Right;   TONSILLECTOMY  1964   TRANSTHORACIC ECHOCARDIOGRAM  04/17/2014   EF 60-65%/  trivial AR, MR, and TR   UPPER GASTROINTESTINAL ENDOSCOPY     VAGINAL HYSTERECTOMY  1984   WRIST GANGLION EXCISION Right 02/05/2004    MEDICATIONS:  albuterol (VENTOLIN HFA) 108 (90 Base) MCG/ACT inhaler   allopurinol (ZYLOPRIM) 100 MG tablet   amLODipine (NORVASC) 2.5 MG tablet   aspirin EC 81 MG tablet   baclofen (LIORESAL) 10 MG tablet   cyanocobalamin (,VITAMIN B-12,) 1000 MCG/ML injection   diclofenac sodium (VOLTAREN) 1 % GEL   ELDERBERRY PO   escitalopram (LEXAPRO) 20 MG tablet   ezetimibe (ZETIA) 10 MG tablet   famotidine (PEPCID) 20 MG tablet   levothyroxine (SYNTHROID, LEVOTHROID) 50 MCG tablet   oxybutynin (DITROPAN) 5 MG tablet   pantoprazole (PROTONIX) 40 MG tablet   rosuvastatin (CRESTOR) 40 MG tablet   Tiotropium Bromide Monohydrate (SPIRIVA RESPIMAT) 2.5 MCG/ACT AERS   topiramate (TOPAMAX) 200 MG tablet   No current facility-administered medications for this encounter.    If no changes, I anticipate pt can proceed with  surgery as scheduled.   Willeen Cass, PhD, FNP-BC [facility]/Anesthesiology Phone: [phone] 06/10/2021 9:31 AM

## 2021-06-10 NOTE — Anesthesia Preprocedure Evaluation (Addendum)
Anesthesia Evaluation  Patient identified by MRN, date of birth, ID band Patient awake    Reviewed: Allergy & Precautions, NPO status , Patient's Chart, lab work & pertinent test results  History of Anesthesia Complications Negative for: history of anesthetic complications  Airway Mallampati: II  TM Distance: >3 FB Neck ROM: Full    Dental no notable dental hx.    Pulmonary COPD,  COPD inhaler, former smoker,    Pulmonary exam normal        Cardiovascular hypertension, Pt. on medications Normal cardiovascular exam  TTE 06/09/21: EF 59%, mild to moderate AR    Neuro/Psych  Headaches, Anxiety Depression    GI/Hepatic Neg liver ROS, GERD  Controlled and Medicated,HEPATIC FLEXURE COLON POLYP   Endo/Other  Hypothyroidism   Renal/GU   negative genitourinary   Musculoskeletal  (+) Arthritis , Fibromyalgia -  Abdominal   Peds  Hematology negative hematology ROS (+)   Anesthesia Other Findings Day of surgery medications reviewed with patient.  Reproductive/Obstetrics negative OB ROS                           Anesthesia Physical Anesthesia Plan  ASA: 2  Anesthesia Plan: General   Post-op Pain Management:    Induction: Intravenous  PONV Risk Score and Plan: 4 or greater and Treatment may vary due to age or medical condition, Dexamethasone, Midazolam and Droperidol  Airway Management Planned: Oral ETT  Additional Equipment: None  Intra-op Plan:   Post-operative Plan: Extubation in OR  Informed Consent: I have reviewed the patients History and Physical, chart, labs and discussed the procedure including the risks, benefits and alternatives for the proposed anesthesia with the patient or authorized representative who has indicated his/her understanding and acceptance.     Dental advisory given  Plan Discussed with: CRNA  Anesthesia Plan Comments: (See APP note by Durel Salts, FNP )      Anesthesia Quick Evaluation

## 2021-06-11 ENCOUNTER — Ambulatory Visit (INDEPENDENT_AMBULATORY_CARE_PROVIDER_SITE_OTHER): Payer: Medicare Other

## 2021-06-11 ENCOUNTER — Encounter (HOSPITAL_COMMUNITY): Payer: Self-pay | Admitting: Surgery

## 2021-06-11 DIAGNOSIS — Z0000 Encounter for general adult medical examination without abnormal findings: Secondary | ICD-10-CM | POA: Diagnosis not present

## 2021-06-11 MED ORDER — BUPIVACAINE LIPOSOME 1.3 % IJ SUSP
20.0000 mL | Freq: Once | INTRAMUSCULAR | Status: DC
  Filled 2021-06-11: qty 20

## 2021-06-11 NOTE — Patient Instructions (Addendum)
Alison Brooks , Thank you for taking time to come for your Medicare Wellness Visit. I appreciate your ongoing commitment to your health goals. Please review the following plan we discussed and let me know if I can assist you in the future.   Screening recommendations/referrals: Colonoscopy: Done 04/07/21 repeat  Mammogram: pt will follow up with [facility]  Bone Density: pt will follow up with [facility] mammography  Recommended yearly ophthalmology/optometry visit for glaucoma screening and checkup Recommended yearly dental visit for hygiene and checkup  Vaccinations: Influenza vaccine: Due Pneumococcal vaccine: postponed until 3/23 Tdap vaccine: Done 04/20/18 repeat every 10 years due 04/20/28 Shingles vaccine: Shingrix discussed. Please contact your pharmacy for coverage information.    Covid-19:Completed 1/23, 2/22, 08/28/20 and 08/04/15  Advanced directives: Advance directive discussed with you today. Even though you declined this today please call our office should you change your mind and we can give you the proper paperwork for you to fill out.  Conditions/risks identified: lose weight   Next appointment: Follow up in one year for your annual wellness visit    Preventive Care 65 Years and Older, Female Preventive care refers to lifestyle choices and visits with your health care provider that can promote health and wellness. What does preventive care include? A yearly physical exam. This is also called an annual well check. Dental exams once or twice a year. Routine eye exams. Ask your health care provider how often you should have your eyes checked. Personal lifestyle choices, including: Daily care of your teeth and gums. Regular physical activity. Eating a healthy diet. Avoiding tobacco and drug use. Limiting alcohol use. Practicing safe sex. Taking low-dose aspirin every day. Taking vitamin and mineral supplements as recommended by your health care provider. What happens  during an annual well check? The services and screenings done by your health care provider during your annual well check will depend on your age, overall health, lifestyle risk factors, and family history of disease. Counseling  Your health care provider may ask you questions about your: Alcohol use. Tobacco use. Drug use. Emotional well-being. Home and relationship well-being. Sexual activity. Eating habits. History of falls. Memory and ability to understand (cognition). Work and work Statistician. Reproductive health. Screening  You may have the following tests or measurements: Height, weight, and BMI. Blood pressure. Lipid and cholesterol levels. These may be checked every 5 years, or more frequently if you are over 65 years old. Skin check. Lung cancer screening. You may have this screening every year starting at age 23 if you have a 30-pack-year history of smoking and currently smoke or have quit within the past 15 years. Fecal occult blood test (FOBT) of the stool. You may have this test every year starting at age 68. Flexible sigmoidoscopy or colonoscopy. You may have a sigmoidoscopy every 5 years or a colonoscopy every 10 years starting at age 53. Hepatitis C blood test. Hepatitis B blood test. Sexually transmitted disease (STD) testing. Diabetes screening. This is done by checking your blood sugar (glucose) after you have not eaten for a while (fasting). You may have this done every 1-3 years. Bone density scan. This is done to screen for osteoporosis. You may have this done starting at age 42. Mammogram. This may be done every 1-2 years. Talk to your health care provider about how often you should have regular mammograms. Talk with your health care provider about your test results, treatment options, and if necessary, the need for more tests. Vaccines  Your health care  provider may recommend certain vaccines, such as: Influenza vaccine. This is recommended every  year. Tetanus, diphtheria, and acellular pertussis (Tdap, Td) vaccine. You may need a Td booster every 10 years. Zoster vaccine. You may need this after age 48. Pneumococcal 13-valent conjugate (PCV13) vaccine. One dose is recommended after age 23. Pneumococcal polysaccharide (PPSV23) vaccine. One dose is recommended after age 65. Talk to your health care provider about which screenings and vaccines you need and how often you need them. This information is not intended to replace advice given to you by your health care provider. Make sure you discuss any questions you have with your health care provider. Document Released: 11/07/2015 Document Revised: 06/30/2016 Document Reviewed: 08/12/2015 Elsevier Interactive Patient Education  2017 [place] Prevention in the Home Falls can cause injuries. They can happen to people of all ages. There are many things you can do to make your home safe and to help prevent falls. What can I do on the outside of my home? Regularly fix the edges of walkways and driveways and fix any cracks. Remove anything that might make you trip as you walk through a door, such as a raised step or threshold. Trim any bushes or trees on the path to your home. Use bright outdoor lighting. Clear any walking paths of anything that might make someone trip, such as rocks or tools. Regularly check to see if handrails are loose or broken. Make sure that both sides of any steps have handrails. Any raised decks and porches should have guardrails on the edges. Have any leaves, snow, or ice cleared regularly. Use sand or salt on walking paths during winter. Clean up any spills in your garage right away. This includes oil or grease spills. What can I do in the bathroom? Use night lights. Install grab bars by the toilet and in the tub and shower. Do not use towel bars as grab bars. Use non-skid mats or decals in the tub or shower. If you need to sit down in the shower, use a  plastic, non-slip stool. Keep the floor dry. Clean up any water that spills on the floor as soon as it happens. Remove soap buildup in the tub or shower regularly. Attach bath mats securely with double-sided non-slip rug tape. Do not have throw rugs and other things on the floor that can make you trip. What can I do in the bedroom? Use night lights. Make sure that you have a light by your bed that is easy to reach. Do not use any sheets or blankets that are too big for your bed. They should not hang down onto the floor. Have a firm chair that has side arms. You can use this for support while you get dressed. Do not have throw rugs and other things on the floor that can make you trip. What can I do in the kitchen? Clean up any spills right away. Avoid walking on wet floors. Keep items that you use a lot in easy-to-reach places. If you need to reach something above you, use a strong step stool that has a grab bar. Keep electrical cords out of the way. Do not use floor polish or wax that makes floors slippery. If you must use wax, use non-skid floor wax. Do not have throw rugs and other things on the floor that can make you trip. What can I do with my stairs? Do not leave any items on the stairs. Make sure that there are handrails on both  sides of the stairs and use them. Fix handrails that are broken or loose. Make sure that handrails are as long as the stairways. Check any carpeting to make sure that it is firmly attached to the stairs. Fix any carpet that is loose or worn. Avoid having throw rugs at the top or bottom of the stairs. If you do have throw rugs, attach them to the floor with carpet tape. Make sure that you have a light switch at the top of the stairs and the bottom of the stairs. If you do not have them, ask someone to add them for you. What else can I do to help prevent falls? Wear shoes that: Do not have high heels. Have rubber bottoms. Are comfortable and fit you  well. Are closed at the toe. Do not wear sandals. If you use a stepladder: Make sure that it is fully opened. Do not climb a closed stepladder. Make sure that both sides of the stepladder are locked into place. Ask someone to hold it for you, if possible. Clearly mark and make sure that you can see: Any grab bars or handrails. First and last steps. Where the edge of each step is. Use tools that help you move around (mobility aids) if they are needed. These include: Canes. Walkers. Scooters. Crutches. Turn on the lights when you go into a dark area. Replace any light bulbs as soon as they burn out. Set up your furniture so you have a clear path. Avoid moving your furniture around. If any of your floors are uneven, fix them. If there are any pets around you, be aware of where they are. Review your medicines with your doctor. Some medicines can make you feel dizzy. This can increase your chance of falling. Ask your doctor what other things that you can do to help prevent falls. This information is not intended to replace advice given to you by your health care provider. Make sure you discuss any questions you have with your health care provider. Document Released: 08/07/2009 Document Revised: 03/18/2016 Document Reviewed: 11/15/2014 Elsevier Interactive Patient Education  2017 [employer].

## 2021-06-11 NOTE — Progress Notes (Addendum)
Virtual Visit via Telephone Note  I connected with  Alison Brooks on 06/11/21 at 10:15 AM EDT by telephone and verified that I am speaking with the correct person using two identifiers.  Medicare Annual Wellness visit completed telephonically due to Covid-19 pandemic.   Persons participating in this call: This Health Coach and this patient.   Location: Patient: Home Provider: Office   I discussed the limitations, risks, security and privacy concerns of performing an evaluation and management service by telephone and the availability of in person appointments. The patient expressed understanding and agreed to proceed.  Unable to perform video visit due to video visit attempted and failed and/or patient does not have video capability.   Some vital signs may be absent or patient reported.   Willette Brace, LPN   Subjective:   Alison Brooks is a 65 y.o. female who presents for Medicare Annual (Subsequent) preventive examination.  Review of Systems     Cardiac Risk Factors include: advanced age (>21mn, >>13women);hypertension;dyslipidemia     Objective:    There were no vitals filed for this visit. There is no height or weight on file to calculate BMI.  Advanced Directives 06/11/2021 06/02/2021 07/06/2020 06/05/2020 07/28/2016 04/02/2016 02/29/2016  Does Patient Have a Medical Advance Directive? No No No No No No No  Would patient like information on creating a medical advance directive? No - Patient declined - No - Patient declined Yes (MAU/Ambulatory/Procedural Areas - Information given) - No - patient declined information No - patient declined information    Current Medications (verified) Outpatient Encounter Medications as of 06/11/2021  Medication Sig   albuterol (VENTOLIN HFA) 108 (90 Base) MCG/ACT inhaler INHALE 2 PUFFS EVERY 6 HOURS AS NEEDED.   allopurinol (ZYLOPRIM) 100 MG tablet TAKE 1 TABLET BY MOUTH  DAILY   amLODipine (NORVASC) 2.5 MG tablet TAKE 1 TABLET BY MOUTH   DAILY   aspirin EC 81 MG tablet Take 81 mg by mouth daily. Swallow whole.   baclofen (LIORESAL) 10 MG tablet TAKE 1/2 TO 1 TABLET BY  MOUTH 3 TIMES DAILY   cyanocobalamin (,VITAMIN B-12,) 1000 MCG/ML injection 1000 mcg (1 mg) injection once per per month or as directed   diclofenac sodium (VOLTAREN) 1 % GEL Apply 4 g topically 4 (four) times daily.   ELDERBERRY PO Take by mouth.   escitalopram (LEXAPRO) 20 MG tablet TAKE 1 TABLET BY MOUTH  DAILY   ezetimibe (ZETIA) 10 MG tablet TAKE 1 TABLET BY MOUTH  DAILY   famotidine (PEPCID) 20 MG tablet Take 1 tablet (20 mg total) by mouth at bedtime.   levothyroxine (SYNTHROID, LEVOTHROID) 50 MCG tablet Take 50 mcg by mouth daily before breakfast.   oxybutynin (DITROPAN) 5 MG tablet TAKE 1 TABLET BY MOUTH 2  TIMES DAILY AS NEEDED FOR  BLADDER SPASMS. (Patient taking differently: TAKE 1 TABLET BY MOUTH 2  TIMES DAILY AS NEEDED FOR  BLADDER SPASMS.)   pantoprazole (PROTONIX) 40 MG tablet TAKE 1 TABLET BY MOUTH  TWICE DAILY 30 TO 60  MINUTES BEFORE MEALS (Patient taking differently: TAKE 1 TABLET BY MOUTH  TWICE DAILY 30 TO 60  MINUTES BEFORE MEALS)   rosuvastatin (CRESTOR) 40 MG tablet TAKE 1 TABLET BY MOUTH  DAILY   Tiotropium Bromide Monohydrate (SPIRIVA RESPIMAT) 2.5 MCG/ACT AERS Inhale 2 puffs into the lungs daily.   topiramate (TOPAMAX) 200 MG tablet TAKE 1 TABLET BY MOUTH  DAILY   [DISCONTINUED] orlistat (XENICAL) 120 MG capsule Take 1 capsule (120  mg total) by mouth 3 (three) times daily with meals.   Facility-Administered Encounter Medications as of 06/11/2021  Medication   [START ON 06/12/2021] bupivacaine liposome (EXPAREL) 1.3 % injection 266 mg    Allergies (verified) Latex, Oxycodone, and Zofran [ondansetron hcl]   History: Past Medical History:  Diagnosis Date   Anemia    Anxiety    Arthritis    back, neck   Cancer (HCC)    skin cancer- squamous cell- left arm and shoulder   Chronic back pain    Chronic kidney disease    kidney  stones   COPD (chronic obstructive pulmonary disease) (HCC)    Depression    Dyspnea    Since covid , NOW ON INHALERS   Fatty liver    Fibromyalgia    GERD (gastroesophageal reflux disease)    Heart murmur    Hematuria    History of adenomatous polyp of colon    History of COVID-19 10/2020   History of kidney stones    History of seizure    x1 2007 post op (per pt neurologist work-up done ?mixture of anesthesia medication and prozac that pt had taken)  and has not any issues since    History of squamous cell carcinoma excision    Hyperlipidemia    Hypertension    Hypothyroidism    IC (interstitial cystitis)    Long COVID    Migraine    Osteoporosis    Pneumonia    history of   Pulmonary nodules    multiple per ct -- monitored by pcp   Right ureteral stone    RLS (restless legs syndrome)    Seizures ([place])    one time incident- greater than 10 years ago, none since, mixture of medication and anesthesia   Thyroid disease    Past Surgical History:  Procedure Laterality Date   CARDIOVASCULAR STRESS TEST  09/02/2004   normal perfusion study/  normal LV function and wall motion, ef 64%   CARPAL TUNNEL RELEASE Right    CERVICAL FUSION  1992   CHOLECYSTECTOMY  1995   COLONOSCOPY     CYSTO/  BOTOX INJECTION  12-27-2008  &  04-01-2006   CYSTO/  HYDRODISTENTION/  INSTILLATION THERAPY  03/04/2006   CYSTOSCOPY W/ URETERAL STENT PLACEMENT Right 04/02/2016   Procedure: CYSTOSCOPY WITH RIGHT RETROGRADE, URETEROSCOPY PYELOGRAM LASER [place];  Surgeon: Kathie Rhodes, MD;  Location: [facility];  Service: Urology;  Laterality: Right;   HEMORRHOID SURGERY  01/03/2004   HOLMIUM LASER APPLICATION Right [id number]   Procedure: HOLMIUM LASER APPLICATION;  Surgeon: Kathie Rhodes, MD;  Location: [facility];  Service: Urology;  Laterality: Right;   LUMBAR DISC SURGERY     LUMBAR FUSION  1996   L4 --S1   ROTATOR CUFF REPAIR Left 2004    SHOULDER ARTHROSCOPY WITH BICEPS TENDON REPAIR Left 12/2015   SPINAL CORD STIMULATOR IMPLANT  x4  last one 2013   STONE EXTRACTION WITH BASKET Right 04/02/2016   Procedure: STONE EXTRACTION WITH BASKET;  Surgeon: Kathie Rhodes, MD;  Location: [facility];  Service: Urology;  Laterality: Right;   TONSILLECTOMY  1964   TRANSTHORACIC ECHOCARDIOGRAM  04/17/2014   EF 60-65%/  trivial AR, MR, and TR   UPPER GASTROINTESTINAL ENDOSCOPY     VAGINAL HYSTERECTOMY  1984   WRIST GANGLION EXCISION Right 02/05/2004   Family History  Problem Relation Age of Onset   Heart attack Mother  Mom 36, Dad 46   Hypertension Mother    Heart disease Mother    Heart disease Father    Diabetes Father    Colon cancer Maternal Grandmother        not sure age of onset   Esophageal cancer Neg Hx    Rectal cancer Neg Hx    Stomach cancer Neg Hx    Social History   Socioeconomic History   Marital status: Married    Spouse name: Antony Haste   Number of children: 1   Years of education: College   Highest education level: Not on file  Occupational History   Occupation: Retired Marine scientist  Tobacco Use   Smoking status: Former    Packs/day: 1.25    Years: 40.00    Pack years: 50.00    Types: Cigarettes    Quit date: 06/07/2008    Years since quitting: 13.0   Smokeless tobacco: Never  Vaping Use   Vaping Use: Never used  Substance and Sexual Activity   Alcohol use: Yes    Alcohol/week: 0.0 standard drinks    Comment: VERY RARE   Drug use: No   Sexual activity: Not on file  Other Topics Concern   Not on file  Social History Narrative   Lives with spouse, Antony Haste.    Caffeine use: 1-2 cups coffee per day   Married 47 years in December 2020. 1 daughter. 2 grandkids (boy and girl). Lives 10 miles away.       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       Hobbies: previously enjoyed dancing, old car shoes (38 chevy), reading   Social Determinants of Radio broadcast assistant Strain: Low  Risk    Difficulty of Paying Living Expenses: Not hard at all  Food Insecurity: No Food Insecurity   Worried About Charity fundraiser in the Last Year: Never true   Arboriculturist in the Last Year: Never true  Transportation Needs: No Transportation Needs   Lack of Transportation (Medical): No   Lack of Transportation (Non-Medical): No  Physical Activity: Inactive   Days of Exercise per Week: 0 days   Minutes of Exercise per Session: 0 min  Stress: No Stress Concern Present   Feeling of Stress : Not at all  Social Connections: Moderately Isolated   Frequency of Communication with Friends and Family: More than three times a week   Frequency of Social Gatherings with Friends and Family: More than three times a week   Attends Religious Services: Never   Marine scientist or Organizations: No   Attends Music therapist: Never   Marital Status: Married    Tobacco Counseling Counseling given: Not Answered   Clinical Intake:  Pre-visit preparation completed: Yes  Pain : No/denies pain     BMI - recorded: 28.72 Nutritional Status: BMI 25 -29 Overweight Nutritional Risks: None Diabetes: No  How often do you need to have someone help you when you read instructions, pamphlets, or other written materials from your doctor or pharmacy?: 1 - Never  Diabetic?No  Interpreter Needed?: No  Information entered by :: Charlott Rakes, LPN   Activities of Daily Living In your present state of health, do you have any difficulty performing the following activities: 06/11/2021 06/02/2021  Hearing? N N  Vision? N N  Difficulty concentrating or making decisions? Y Y  Comment brain fog brain fog and slow to form words after COVID  Walking or climbing stairs?  Y N  Comment SOB -  Dressing or bathing? N N  Doing errands, shopping? N N  Preparing Food and eating ? N -  Using the Toilet? N -  In the past six months, have you accidently leaked urine? N -  Do you have  problems with loss of bowel control? Y -  Comment for procedure 06/12/21 -  Managing your Medications? N -  Managing your Finances? N -  Housekeeping or managing your Housekeeping? N -  Some recent data might be hidden    Patient Care Team: [name], MD as PCP - General (Family Medicine) Madelin Rear, [facility] as Pharmacist (Pharmacist)  Indicate any recent Medical Services you may have received from other than [facility] providers in the past year (date may be approximate).     Assessment:   This is a routine wellness examination for Alison Brooks.  Hearing/Vision screen Hearing Screening - Comments:: Pt denies any hearing issues  Vision Screening - Comments:: Encourage to follow up with Providers   Dietary issues and exercise activities discussed: Current Exercise Habits: The patient does not participate in regular exercise at present   Goals Addressed             This Visit's Progress    Patient Stated       Lose weight        Depression Screen PHQ 2/9 Scores 06/11/2021 03/31/2021 02/24/2021 11/24/2020 06/05/2020 05/23/2020 07/17/2019  PHQ - 2 Score 0 0 0 0 0 0 0  PHQ- 9 Score - 6 0 3 - 0 1    Fall Risk Fall Risk  06/11/2021 03/31/2021 11/24/2020 06/05/2020  Falls in the past year? 0 0 0 0  Number falls in past yr: 0 0 0 0  Injury with Fall? 0 0 0 0  Risk for fall due to : Impaired vision - - Impaired vision  Follow up Falls prevention discussed - - Falls prevention discussed    FALL RISK PREVENTION PERTAINING TO THE HOME:  Any stairs in or around the home? Yes  If so, are there any without handrails? No  Home free of loose throw rugs in walkways, pet beds, electrical cords, etc? Yes  Adequate lighting in your home to reduce risk of falls? Yes   ASSISTIVE DEVICES UTILIZED TO PREVENT FALLS:  Life alert? No  Use of a cane, walker or w/c? No  Grab bars in the bathroom? No  Shower chair or bench in shower? No  Elevated toilet seat or a handicapped toilet? No   TIMED UP AND  GO:  Was the test performed? No .   Cognitive Function:     6CIT Screen 06/11/2021 06/05/2020  What Year? 0 points 0 points  What month? 0 points 0 points  What time? 0 points -  Count back from 20 0 points 0 points  Months in reverse 0 points 2 points  Repeat phrase 0 points 0 points  Total Score 0 -    Immunizations Immunization History  Administered Date(s) Administered   Fluad Quad(high Dose 65+) 07/17/2019   Influenza Whole 08/11/1999, 08/11/2007, 07/25/2009   Influenza,inj,Quad PF,6+ Mos 09/03/2013, 07/12/2014, 08/04/2015   Influenza-Unspecified 07/25/2017   Moderna Sars-Covid-2 Vaccination 11/17/2019, 12/17/2019, 08/28/2020   Pneumococcal Polysaccharide-23 07/17/2019   Td 10/25/1998, 10/25/2006   Tdap 04/20/2018    TDAP status: Up to date  Flu Vaccine status: Due, Education has been provided regarding the importance of this vaccine. Advised may receive this vaccine at local pharmacy or Health Dept.  Aware to provide a copy of the vaccination record if obtained from local pharmacy or Health Dept. Verbalized acceptance and understanding.  Pneumococcal vaccine status: Up to date  Covid-19 vaccine status: Completed vaccines  Qualifies for Shingles Vaccine? Yes   Zostavax completed No   Shingrix Completed?: No.    Education has been provided regarding the importance of this vaccine. Patient has been advised to call insurance company to determine out of pocket expense if they have not yet received this vaccine. Advised may also receive vaccine at local pharmacy or Health Dept. Verbalized acceptance and understanding.  Screening Tests Health Maintenance  Topic Date Due   Zoster Vaccines- Shingrix (1 of 2) Never done   MAMMOGRAM  07/27/2018   DEXA SCAN  Never done   COVID-19 Vaccine (4 - Booster for Moderna series) 11/28/2020   INFLUENZA VACCINE  05/25/2021   PNA vac Low Risk Adult (2 of 2 - PCV13) 01/01/2022 (Originally 07/16/2020)   COLONOSCOPY (Pts 45-77yr Insurance  coverage will need to be confirmed)  04/07/2022   TETANUS/TDAP  04/20/2028   Hepatitis C Screening  Completed   HPV VACCINES  Aged Out    Health Maintenance  Health Maintenance Due  Topic Date Due   Zoster Vaccines- Shingrix (1 of 2) Never done   MAMMOGRAM  07/27/2018   DEXA SCAN  Never done   COVID-19 Vaccine (4 - Booster for Moderna series) 11/28/2020   INFLUENZA VACCINE  05/25/2021    Colorectal cancer screening: Type of screening: Colonoscopy. Completed 04/07/21. Repeat every 1 years  Mammogram status: Completed 07/28/19. Repeat every year    Additional Screening:  Hepatitis C Screening: Completed 08/04/2015  Vision Screening: Recommended annual ophthalmology exams for early detection of glaucoma and other disorders of the eye. Is the patient up to date with their annual eye exam?  No  Who is the provider or what is the name of the office in which the patient attends annual eye exams? Encouraged to follow up with provider  If pt is not established with a provider, would they like to be referred to a provider to establish care? No .   Dental Screening: Recommended annual dental exams for proper oral hygiene  Community Resource Referral / Chronic Care Management: CRR required this visit?  No   CCM required this visit?  No      Plan:     I have personally reviewed and noted the following in the patient's chart:   Medical and social history Use of alcohol, tobacco or illicit drugs  Current medications and supplements including opioid prescriptions.  Functional ability and status Nutritional status Physical activity Advanced directives List of other physicians Hospitalizations, surgeries, and ER visits in previous 12 months Vitals Screenings to include cognitive, depression, and falls Referrals and appointments  In addition, I have reviewed and discussed with patient certain preventive protocols, quality metrics, and best practice recommendations. A written  personalized care plan for preventive services as well as general preventive health recommendations were provided to patient.     TWillette Brace LPN   [id number]  Nurse Notes: pt stated she will be having a bowel resection on 06/11/21.

## 2021-06-12 ENCOUNTER — Inpatient Hospital Stay (HOSPITAL_COMMUNITY)
Admission: RE | Admit: 2021-06-12 | Discharge: 2021-06-15 | DRG: 330 | Disposition: A | Discharge status: Home / Self Care | Payer: Medicare Other | Attending: Surgery | Admitting: Surgery

## 2021-06-12 ENCOUNTER — Inpatient Hospital Stay (HOSPITAL_COMMUNITY): Payer: Medicare Other | Admitting: Anesthesiology

## 2021-06-12 ENCOUNTER — Inpatient Hospital Stay (HOSPITAL_COMMUNITY): Payer: Medicare Other | Admitting: Physician Assistant

## 2021-06-12 ENCOUNTER — Encounter (HOSPITAL_COMMUNITY): Payer: Self-pay | Admitting: Surgery

## 2021-06-12 ENCOUNTER — Encounter (HOSPITAL_COMMUNITY)
Admission: RE | Disposition: A | Discharge status: Home / Self Care | Payer: Self-pay | Source: Home / Self Care | Attending: Surgery

## 2021-06-12 DIAGNOSIS — I10 Essential (primary) hypertension: Secondary | ICD-10-CM | POA: Diagnosis present

## 2021-06-12 DIAGNOSIS — Z800 Family history of malignant neoplasm of digestive organs: Secondary | ICD-10-CM

## 2021-06-12 DIAGNOSIS — K921 Melena: Secondary | ICD-10-CM | POA: Diagnosis present

## 2021-06-12 DIAGNOSIS — Z85828 Personal history of other malignant neoplasm of skin: Secondary | ICD-10-CM

## 2021-06-12 DIAGNOSIS — K660 Peritoneal adhesions (postprocedural) (postinfection): Secondary | ICD-10-CM | POA: Diagnosis present

## 2021-06-12 DIAGNOSIS — Z87442 Personal history of urinary calculi: Secondary | ICD-10-CM

## 2021-06-12 DIAGNOSIS — M797 Fibromyalgia: Secondary | ICD-10-CM | POA: Diagnosis present

## 2021-06-12 DIAGNOSIS — E785 Hyperlipidemia, unspecified: Secondary | ICD-10-CM | POA: Diagnosis present

## 2021-06-12 DIAGNOSIS — Z87891 Personal history of nicotine dependence: Secondary | ICD-10-CM

## 2021-06-12 DIAGNOSIS — M810 Age-related osteoporosis without current pathological fracture: Secondary | ICD-10-CM | POA: Diagnosis present

## 2021-06-12 DIAGNOSIS — K219 Gastro-esophageal reflux disease without esophagitis: Secondary | ICD-10-CM | POA: Diagnosis present

## 2021-06-12 DIAGNOSIS — K635 Polyp of colon: Principal | ICD-10-CM | POA: Diagnosis present

## 2021-06-12 DIAGNOSIS — G2581 Restless legs syndrome: Secondary | ICD-10-CM | POA: Diagnosis present

## 2021-06-12 DIAGNOSIS — Z888 Allergy status to other drugs, medicaments and biological substances status: Secondary | ICD-10-CM | POA: Diagnosis not present

## 2021-06-12 DIAGNOSIS — Z9049 Acquired absence of other specified parts of digestive tract: Secondary | ICD-10-CM

## 2021-06-12 DIAGNOSIS — Z885 Allergy status to narcotic agent status: Secondary | ICD-10-CM | POA: Diagnosis not present

## 2021-06-12 DIAGNOSIS — E039 Hypothyroidism, unspecified: Secondary | ICD-10-CM | POA: Diagnosis present

## 2021-06-12 DIAGNOSIS — J449 Chronic obstructive pulmonary disease, unspecified: Secondary | ICD-10-CM | POA: Diagnosis present

## 2021-06-12 DIAGNOSIS — F32A Depression, unspecified: Secondary | ICD-10-CM | POA: Diagnosis present

## 2021-06-12 DIAGNOSIS — M109 Gout, unspecified: Secondary | ICD-10-CM | POA: Diagnosis present

## 2021-06-12 DIAGNOSIS — Z8249 Family history of ischemic heart disease and other diseases of the circulatory system: Secondary | ICD-10-CM

## 2021-06-12 DIAGNOSIS — Z91040 Latex allergy status: Secondary | ICD-10-CM

## 2021-06-12 DIAGNOSIS — Z8616 Personal history of COVID-19: Secondary | ICD-10-CM | POA: Diagnosis not present

## 2021-06-12 DIAGNOSIS — Z981 Arthrodesis status: Secondary | ICD-10-CM | POA: Diagnosis not present

## 2021-06-12 HISTORY — PX: LAPAROSCOPIC RIGHT HEMI COLECTOMY: SHX5926

## 2021-06-12 LAB — TYPE AND SCREEN
ABO/RH(D): O POS
Antibody Screen: NEGATIVE

## 2021-06-12 LAB — ABO/RH: ABO/RH(D): O POS

## 2021-06-12 SURGERY — LAPAROSCOPIC RIGHT HEMI COLECTOMY
Anesthesia: General

## 2021-06-12 MED ORDER — MIDAZOLAM HCL 2 MG/2ML IJ SOLN
INTRAMUSCULAR | Status: AC
  Filled 2021-06-12: qty 2

## 2021-06-12 MED ORDER — SODIUM CHLORIDE 0.9 % IR SOLN
Status: DC | PRN
  Administered 2021-06-12: 2000 mL

## 2021-06-12 MED ORDER — LEVOTHYROXINE SODIUM 50 MCG PO TABS
50.0000 ug | ORAL_TABLET | Freq: Every day | ORAL | Status: DC
  Administered 2021-06-13 – 2021-06-15 (×3): 50 ug via ORAL
  Filled 2021-06-12 (×3): qty 1

## 2021-06-12 MED ORDER — ALLOPURINOL 100 MG PO TABS
100.0000 mg | ORAL_TABLET | Freq: Every day | ORAL | Status: DC
  Administered 2021-06-13 – 2021-06-15 (×3): 100 mg via ORAL
  Filled 2021-06-12 (×4): qty 1

## 2021-06-12 MED ORDER — ENSURE SURGERY PO LIQD
237.0000 mL | Freq: Two times a day (BID) | ORAL | Status: DC
  Administered 2021-06-14: 237 mL via ORAL

## 2021-06-12 MED ORDER — UMECLIDINIUM BROMIDE 62.5 MCG/INH IN AEPB
1.0000 | INHALATION_SPRAY | Freq: Every day | RESPIRATORY_TRACT | Status: DC
  Administered 2021-06-13 – 2021-06-15 (×3): 1 via RESPIRATORY_TRACT
  Filled 2021-06-12: qty 7

## 2021-06-12 MED ORDER — BUPIVACAINE-EPINEPHRINE (PF) 0.25% -1:200000 IJ SOLN
INTRAMUSCULAR | Status: AC
  Filled 2021-06-12: qty 30

## 2021-06-12 MED ORDER — CHLORHEXIDINE GLUCONATE CLOTH 2 % EX PADS: 6.0000 | MEDICATED_PAD | Freq: Once | CUTANEOUS | Status: DC

## 2021-06-12 MED ORDER — ENSURE PRE-SURGERY PO LIQD
296.0000 mL | Freq: Once | ORAL | Status: DC
Start: 2021-06-13 — End: 2021-06-12

## 2021-06-12 MED ORDER — ALVIMOPAN 12 MG PO CAPS
12.0000 mg | ORAL_CAPSULE | Freq: Two times a day (BID) | ORAL | Status: DC
  Administered 2021-06-13 – 2021-06-15 (×5): 12 mg via ORAL
  Filled 2021-06-12 (×5): qty 1

## 2021-06-12 MED ORDER — PROPOFOL 10 MG/ML IV BOLUS
INTRAVENOUS | Status: AC
  Filled 2021-06-12: qty 20

## 2021-06-12 MED ORDER — BUPIVACAINE-EPINEPHRINE 0.25% -1:200000 IJ SOLN
INTRAMUSCULAR | Status: DC | PRN
  Administered 2021-06-12: 30 mL

## 2021-06-12 MED ORDER — ALVIMOPAN 12 MG PO CAPS
12.0000 mg | ORAL_CAPSULE | ORAL | Status: AC
  Administered 2021-06-12: 12 mg via ORAL
  Filled 2021-06-12: qty 1

## 2021-06-12 MED ORDER — KETAMINE HCL 10 MG/ML IJ SOLN
INTRAMUSCULAR | Status: DC | PRN
  Administered 2021-06-12: 25 mg via INTRAVENOUS

## 2021-06-12 MED ORDER — ACETAMINOPHEN 500 MG PO TABS
1000.0000 mg | ORAL_TABLET | Freq: Once | ORAL | Status: AC
  Administered 2021-06-12: 1000 mg via ORAL
  Filled 2021-06-12: qty 2

## 2021-06-12 MED ORDER — ENSURE PRE-SURGERY PO LIQD
592.0000 mL | Freq: Once | ORAL | Status: DC
  Filled 2021-06-12: qty 592

## 2021-06-12 MED ORDER — OXYBUTYNIN CHLORIDE 5 MG PO TABS: 5.0000 mg | ORAL_TABLET | Freq: Two times a day (BID) | ORAL | Status: DC | PRN

## 2021-06-12 MED ORDER — MIDAZOLAM HCL 2 MG/2ML IJ SOLN
INTRAMUSCULAR | Status: DC | PRN
  Administered 2021-06-12: 2 mg via INTRAVENOUS

## 2021-06-12 MED ORDER — DEXAMETHASONE SODIUM PHOSPHATE 10 MG/ML IJ SOLN
INTRAMUSCULAR | Status: DC | PRN
  Administered 2021-06-12: 10 mg via INTRAVENOUS

## 2021-06-12 MED ORDER — FENTANYL CITRATE (PF) 100 MCG/2ML IJ SOLN
INTRAMUSCULAR | Status: DC | PRN
  Administered 2021-06-12 (×7): 50 ug via INTRAVENOUS

## 2021-06-12 MED ORDER — HEPARIN SODIUM (PORCINE) 5000 UNIT/ML IJ SOLN
5000.0000 [IU] | Freq: Once | INTRAMUSCULAR | Status: AC
  Administered 2021-06-12: 5000 [IU] via SUBCUTANEOUS
  Filled 2021-06-12: qty 1

## 2021-06-12 MED ORDER — ROCURONIUM BROMIDE 10 MG/ML (PF) SYRINGE
PREFILLED_SYRINGE | INTRAVENOUS | Status: AC
  Filled 2021-06-12: qty 10

## 2021-06-12 MED ORDER — BUPIVACAINE LIPOSOME 1.3 % IJ SUSP
INTRAMUSCULAR | Status: DC | PRN
  Administered 2021-06-12: 20 mL

## 2021-06-12 MED ORDER — EPHEDRINE SULFATE-NACL 50-0.9 MG/10ML-% IV SOSY
PREFILLED_SYRINGE | INTRAVENOUS | Status: DC | PRN
  Administered 2021-06-12: 10 mg via INTRAVENOUS
  Administered 2021-06-12: 15 mg via INTRAVENOUS
  Administered 2021-06-12 (×2): 10 mg via INTRAVENOUS

## 2021-06-12 MED ORDER — PANTOPRAZOLE SODIUM 40 MG PO TBEC
40.0000 mg | DELAYED_RELEASE_TABLET | Freq: Every day | ORAL | Status: DC
  Administered 2021-06-13 – 2021-06-15 (×3): 40 mg via ORAL
  Filled 2021-06-12 (×3): qty 1

## 2021-06-12 MED ORDER — SUGAMMADEX SODIUM 200 MG/2ML IV SOLN
INTRAVENOUS | Status: DC | PRN
  Administered 2021-06-12: 200 mg via INTRAVENOUS

## 2021-06-12 MED ORDER — LIDOCAINE HCL (PF) 2 % IJ SOLN
INTRAMUSCULAR | Status: DC | PRN
  Administered 2021-06-12: 1.5 mg/kg/h via INTRADERMAL

## 2021-06-12 MED ORDER — FENTANYL CITRATE (PF) 250 MCG/5ML IJ SOLN
INTRAMUSCULAR | Status: AC
  Filled 2021-06-12: qty 5

## 2021-06-12 MED ORDER — PHENYLEPHRINE 40 MCG/ML (10ML) SYRINGE FOR IV PUSH (FOR BLOOD PRESSURE SUPPORT)
PREFILLED_SYRINGE | INTRAVENOUS | Status: DC | PRN
  Administered 2021-06-12 (×2): 80 ug via INTRAVENOUS

## 2021-06-12 MED ORDER — ROSUVASTATIN CALCIUM 20 MG PO TABS
40.0000 mg | ORAL_TABLET | Freq: Every day | ORAL | Status: DC
  Administered 2021-06-13 – 2021-06-15 (×3): 40 mg via ORAL
  Filled 2021-06-12 (×3): qty 2

## 2021-06-12 MED ORDER — LIDOCAINE 2% (20 MG/ML) 5 ML SYRINGE
INTRAMUSCULAR | Status: AC
  Filled 2021-06-12: qty 5

## 2021-06-12 MED ORDER — HYDRALAZINE HCL 20 MG/ML IJ SOLN: 10.0000 mg | INTRAMUSCULAR | Status: DC | PRN

## 2021-06-12 MED ORDER — LACTATED RINGERS IR SOLN
Status: DC | PRN
  Administered 2021-06-12: 1000 mL

## 2021-06-12 MED ORDER — BACLOFEN 10 MG PO TABS
5.0000 mg | ORAL_TABLET | Freq: Three times a day (TID) | ORAL | Status: DC | PRN
  Administered 2021-06-12 – 2021-06-13 (×3): 5 mg via ORAL
  Filled 2021-06-12 (×3): qty 1

## 2021-06-12 MED ORDER — DICLOFENAC SODIUM 1 % TD GEL
4.0000 g | Freq: Four times a day (QID) | TRANSDERMAL | Status: DC
  Filled 2021-06-12: qty 100

## 2021-06-12 MED ORDER — TRAMADOL HCL 50 MG PO TABS
50.0000 mg | ORAL_TABLET | Freq: Four times a day (QID) | ORAL | Status: DC | PRN
  Administered 2021-06-12 – 2021-06-14 (×4): 50 mg via ORAL
  Filled 2021-06-12 (×4): qty 1

## 2021-06-12 MED ORDER — TIOTROPIUM BROMIDE MONOHYDRATE 2.5 MCG/ACT IN AERS: 2.0000 | INHALATION_SPRAY | Freq: Every day | RESPIRATORY_TRACT | Status: DC

## 2021-06-12 MED ORDER — BISACODYL 5 MG PO TBEC: 20.0000 mg | DELAYED_RELEASE_TABLET | Freq: Once | ORAL | Status: DC

## 2021-06-12 MED ORDER — METRONIDAZOLE 500 MG PO TABS: 1000.0000 mg | ORAL_TABLET | ORAL | Status: DC

## 2021-06-12 MED ORDER — PROCHLORPERAZINE MALEATE 10 MG PO TABS
10.0000 mg | ORAL_TABLET | Freq: Four times a day (QID) | ORAL | Status: DC | PRN
  Administered 2021-06-13: 10 mg via ORAL
  Filled 2021-06-12 (×3): qty 1

## 2021-06-12 MED ORDER — ROCURONIUM BROMIDE 10 MG/ML (PF) SYRINGE
PREFILLED_SYRINGE | INTRAVENOUS | Status: DC | PRN
  Administered 2021-06-12: 60 mg via INTRAVENOUS

## 2021-06-12 MED ORDER — TOPIRAMATE 100 MG PO TABS
200.0000 mg | ORAL_TABLET | Freq: Every day | ORAL | Status: DC
  Administered 2021-06-13 – 2021-06-15 (×3): 200 mg via ORAL
  Filled 2021-06-12 (×3): qty 2

## 2021-06-12 MED ORDER — LACTATED RINGERS IV SOLN: INTRAVENOUS | Status: DC

## 2021-06-12 MED ORDER — LIDOCAINE 2% (20 MG/ML) 5 ML SYRINGE
INTRAMUSCULAR | Status: DC | PRN
  Administered 2021-06-12: 100 mg via INTRAVENOUS

## 2021-06-12 MED ORDER — DIPHENHYDRAMINE HCL 50 MG/ML IJ SOLN: 12.5000 mg | Freq: Four times a day (QID) | INTRAMUSCULAR | Status: DC | PRN

## 2021-06-12 MED ORDER — CHLORHEXIDINE GLUCONATE 0.12 % MT SOLN
15.0000 mL | Freq: Once | OROMUCOSAL | Status: AC
  Administered 2021-06-12: 15 mL via OROMUCOSAL

## 2021-06-12 MED ORDER — IBUPROFEN 400 MG PO TABS: 600.0000 mg | ORAL_TABLET | Freq: Four times a day (QID) | ORAL | Status: DC | PRN

## 2021-06-12 MED ORDER — POLYETHYLENE GLYCOL 3350 17 GM/SCOOP PO POWD: 1.0000 | Freq: Once | ORAL | Status: DC

## 2021-06-12 MED ORDER — ACETAMINOPHEN 500 MG PO TABS: 1000.0000 mg | ORAL_TABLET | ORAL | Status: DC

## 2021-06-12 MED ORDER — PROCHLORPERAZINE EDISYLATE 10 MG/2ML IJ SOLN: 5.0000 mg | Freq: Four times a day (QID) | INTRAMUSCULAR | Status: DC | PRN

## 2021-06-12 MED ORDER — DIPHENHYDRAMINE HCL 12.5 MG/5ML PO ELIX: 12.5000 mg | ORAL_SOLUTION | Freq: Four times a day (QID) | ORAL | Status: DC | PRN

## 2021-06-12 MED ORDER — PROPOFOL 10 MG/ML IV BOLUS
INTRAVENOUS | Status: DC | PRN
  Administered 2021-06-12: 140 mg via INTRAVENOUS

## 2021-06-12 MED ORDER — NEOMYCIN SULFATE 500 MG PO TABS: 1000.0000 mg | ORAL_TABLET | ORAL | Status: DC

## 2021-06-12 MED ORDER — EZETIMIBE 10 MG PO TABS
10.0000 mg | ORAL_TABLET | Freq: Every day | ORAL | Status: DC
  Administered 2021-06-13 – 2021-06-15 (×3): 10 mg via ORAL
  Filled 2021-06-12 (×3): qty 1

## 2021-06-12 MED ORDER — SODIUM CHLORIDE 0.9 % IV SOLN
2.0000 g | INTRAVENOUS | Status: AC
  Administered 2021-06-12: 2 g via INTRAVENOUS
  Filled 2021-06-12: qty 2

## 2021-06-12 MED ORDER — ACETAMINOPHEN 500 MG PO TABS
1000.0000 mg | ORAL_TABLET | Freq: Four times a day (QID) | ORAL | Status: DC
  Administered 2021-06-12 – 2021-06-15 (×9): 1000 mg via ORAL
  Filled 2021-06-12 (×10): qty 2

## 2021-06-12 MED ORDER — ORAL CARE MOUTH RINSE: 15.0000 mL | Freq: Once | OROMUCOSAL | Status: AC

## 2021-06-12 MED ORDER — HYDROMORPHONE HCL 1 MG/ML IJ SOLN
0.5000 mg | INTRAMUSCULAR | Status: DC | PRN
  Administered 2021-06-12 – 2021-06-13 (×3): 0.5 mg via INTRAVENOUS
  Filled 2021-06-12 (×3): qty 0.5

## 2021-06-12 MED ORDER — ALBUTEROL SULFATE (2.5 MG/3ML) 0.083% IN NEBU: 3.0000 mL | INHALATION_SOLUTION | Freq: Four times a day (QID) | RESPIRATORY_TRACT | Status: DC | PRN

## 2021-06-12 MED ORDER — SIMETHICONE 80 MG PO CHEW
40.0000 mg | CHEWABLE_TABLET | Freq: Four times a day (QID) | ORAL | Status: DC | PRN
  Administered 2021-06-12 – 2021-06-13 (×2): 40 mg via ORAL
  Filled 2021-06-12 (×2): qty 1

## 2021-06-12 MED ORDER — ALUM & MAG HYDROXIDE-SIMETH 200-200-20 MG/5ML PO SUSP: 30.0000 mL | Freq: Four times a day (QID) | ORAL | Status: DC | PRN

## 2021-06-12 MED ORDER — ESCITALOPRAM OXALATE 20 MG PO TABS
20.0000 mg | ORAL_TABLET | Freq: Every day | ORAL | Status: DC
  Administered 2021-06-13 – 2021-06-15 (×3): 20 mg via ORAL
  Filled 2021-06-12 (×3): qty 1

## 2021-06-12 MED ORDER — FENTANYL CITRATE (PF) 100 MCG/2ML IJ SOLN
INTRAMUSCULAR | Status: AC
  Filled 2021-06-12: qty 2

## 2021-06-12 MED ORDER — HEPARIN SODIUM (PORCINE) 5000 UNIT/ML IJ SOLN
5000.0000 [IU] | Freq: Three times a day (TID) | INTRAMUSCULAR | Status: DC
  Administered 2021-06-13 – 2021-06-15 (×7): 5000 [IU] via SUBCUTANEOUS
  Filled 2021-06-12 (×7): qty 1

## 2021-06-12 MED ORDER — AMLODIPINE BESYLATE 5 MG PO TABS
2.5000 mg | ORAL_TABLET | Freq: Every day | ORAL | Status: DC
  Administered 2021-06-13 – 2021-06-15 (×2): 2.5 mg via ORAL
  Filled 2021-06-12 (×3): qty 1

## 2021-06-12 SURGICAL SUPPLY — 57 items
APPLIER CLIP ROT 10 11.4 M/L (STAPLE)
BAG COUNTER SPONGE SURGICOUNT (BAG) IMPLANT
BLADE CLIPPER SURG (BLADE) ×2 IMPLANT
CABLE HIGH FREQUENCY MONO STRZ (ELECTRODE) ×2 IMPLANT
CELLS DAT CNTRL 66122 CELL SVR (MISCELLANEOUS) IMPLANT
CHLORAPREP W/TINT 26 (MISCELLANEOUS) IMPLANT
CLIP APPLIE ROT 10 11.4 M/L (STAPLE) IMPLANT
DECANTER SPIKE VIAL GLASS SM (MISCELLANEOUS) ×2 IMPLANT
DERMABOND ADVANCED (GAUZE/BANDAGES/DRESSINGS) ×1
DERMABOND ADVANCED .7 DNX12 (GAUZE/BANDAGES/DRESSINGS) ×1 IMPLANT
DISSECTOR BLUNT TIP ENDO 5MM (MISCELLANEOUS) IMPLANT
DRSG OPSITE POSTOP 4X6 (GAUZE/BANDAGES/DRESSINGS) ×2 IMPLANT
ELECT REM PT RETURN 15FT ADLT (MISCELLANEOUS) ×2 IMPLANT
GAUZE SPONGE 4X4 12PLY STRL (GAUZE/BANDAGES/DRESSINGS) ×2 IMPLANT
GOWN STRL REUS W/TWL XL LVL3 (GOWN DISPOSABLE) ×8 IMPLANT
KIT TURNOVER KIT A (KITS) ×2 IMPLANT
LIGASURE IMPACT 36 18CM CVD LR (INSTRUMENTS) IMPLANT
NS IRRIG 1000ML POUR BTL (IV SOLUTION) ×2 IMPLANT
PACK COLON (CUSTOM PROCEDURE TRAY) ×2 IMPLANT
PAD POSITIONING PINK XL (MISCELLANEOUS) ×2 IMPLANT
PENCIL SMOKE EVACUATOR (MISCELLANEOUS) IMPLANT
PROTECTOR NERVE ULNAR (MISCELLANEOUS) IMPLANT
RELOAD PROXIMATE 75MM BLUE (ENDOMECHANICALS) ×4 IMPLANT
RTRCTR WOUND ALEXIS 18CM MED (MISCELLANEOUS)
SCISSORS LAP 5X35 DISP (ENDOMECHANICALS) ×2 IMPLANT
SEALER TISSUE G2 STRG ARTC 35C (ENDOMECHANICALS) ×2 IMPLANT
SET IRRIG TUBING LAPAROSCOPIC (IRRIGATION / IRRIGATOR) IMPLANT
SET TUBE SMOKE EVAC HIGH FLOW (TUBING) ×2 IMPLANT
SHEARS HARMONIC ACE PLUS 36CM (ENDOMECHANICALS) IMPLANT
SLEEVE ADV FIXATION 5X100MM (TROCAR) ×4 IMPLANT
SLEEVE ENDOPATH XCEL 5M (ENDOMECHANICALS) IMPLANT
SPONGE T-LAP 18X18 ~~LOC~~+RFID (SPONGE) ×4 IMPLANT
STAPLER 90 3.5 STAND SLIM (STAPLE) ×2
STAPLER 90 3.5 STD SLIM (STAPLE) ×1 IMPLANT
STAPLER PROXIMATE 75MM BLUE (STAPLE) ×2 IMPLANT
STAPLER VISISTAT 35W (STAPLE) IMPLANT
SUT MNCRL AB 4-0 PS2 18 (SUTURE) ×4 IMPLANT
SUT PDS AB 1 CT1 27 (SUTURE) ×4 IMPLANT
SUT PROLENE 2 0 CT2 30 (SUTURE) IMPLANT
SUT PROLENE 2 0 KS (SUTURE) IMPLANT
SUT SILK 2 0 (SUTURE) ×2
SUT SILK 2 0 SH CR/8 (SUTURE) ×2 IMPLANT
SUT SILK 2-0 18XBRD TIE 12 (SUTURE) ×1 IMPLANT
SUT SILK 3 0 (SUTURE)
SUT SILK 3 0 SH CR/8 (SUTURE) ×2 IMPLANT
SUT SILK 3-0 18XBRD TIE 12 (SUTURE) IMPLANT
SYS LAPSCP GELPORT 120MM (MISCELLANEOUS)
SYSTEM LAPSCP GELPORT 120MM (MISCELLANEOUS) IMPLANT
TAPE CLOTH 4X10 WHT NS (GAUZE/BANDAGES/DRESSINGS) IMPLANT
TOWEL OR 17X26 10 PK STRL BLUE (TOWEL DISPOSABLE) IMPLANT
TRAY FOL W/BAG SLVR 16FR STRL (SET/KITS/TRAYS/PACK) ×1 IMPLANT
TRAY FOLEY W/BAG SLVR 16FR LF (SET/KITS/TRAYS/PACK) ×2
TROCAR ADV FIXATION 5X100MM (TROCAR) ×2 IMPLANT
TROCAR BALLN 12MMX100 BLUNT (TROCAR) IMPLANT
TROCAR XCEL NON-BLD 11X100MML (ENDOMECHANICALS) IMPLANT
TUBING CONNECTING 10 (TUBING) ×4 IMPLANT
YANKAUER SUCT BULB TIP NO VENT (SUCTIONS) IMPLANT

## 2021-06-12 NOTE — H&P (Signed)
CC: Here today for surgery  HPI: Alison Brooks is a 65 y.o. 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 She was seen in the office for consultation at the request of Dr. Henrene Pastor for evaluation of hepatic flexure polyp. She had a colonoscopy performed back in 2020 where she was found to have a large polypoid lesion at the hepatic flexure that was removed in a piecemeal manner. The area was tattooed. Pathology showed tubular adenoma. She had a repeat colonoscopy completed 04/07/2021 that demonstrates a large lateral spreading tumor in the region of the right colon/hepatic flexure at the level of the tattoo where her previous polypectomy had been. There is a large bulky multilobulated mass involving 40% of the circumference of the colon. This spreads proximal to the tattoo. Central portion is umbilicated. Lesion does not lift. Biopsies were taken and returned tubular adenoma. She was referred to [place] for further evaluation.  She denies any complaints today. She denies any abdominal pain, nausea/vomiting, bloating. She does note occasional blood in her stool but also reports history of hemorrhoids.  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  PSH: Laparoscopic cholecystectomy; partial hysterectomy via Pfannenstiel incision. Multiple spinal cord implant devices which were placed in the subcutaneous tissue on the anterior abdominal wall.  FHx: She reports her mother had both colon and breast cancer. Denies any known family history of endometrial or ovarian cancer  Social Hx: Denies use of tobacco/drugs; rare social EtOH use. 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  Past Medical History:  Diagnosis Date   Anxiety    Arthritis    back, neck    Chronic back pain    COPD (chronic obstructive pulmonary disease) (HCC)    Depression    Dyspnea    Since covid , NOW ON INHALERS   Fatty liver    Fibromyalgia    GERD (gastroesophageal reflux disease)    Heart murmur    Hematuria    History of adenomatous polyp of colon    History of COVID-19 10/2020   History of kidney stones    History of seizure    x1 2007 post op (per pt neurologist work-up done ?mixture of anesthesia medication and prozac that pt had taken)  and has not any issues since    History of squamous cell carcinoma excision    Hyperlipidemia    Hypertension    Hypothyroidism    IC (interstitial cystitis)    Long COVID    Migraine    Osteoporosis    Pneumonia    history of   Pulmonary nodules    multiple per ct -- monitored by pcp   Right ureteral stone    RLS (restless legs syndrome)     Past Surgical History:  Procedure Laterality Date   CARDIOVASCULAR STRESS TEST  09/02/2004   normal perfusion study/  normal LV function and wall motion, ef 64%   CARPAL TUNNEL RELEASE Right    CERVICAL FUSION  1992   CHOLECYSTECTOMY  1995   COLONOSCOPY     CYSTO/  BOTOX INJECTION  12-27-2008  &  04-01-2006   CYSTO/  HYDRODISTENTION/  INSTILLATION THERAPY  03/04/2006   CYSTOSCOPY W/ URETERAL STENT PLACEMENT Right 04/02/2016   Procedure: CYSTOSCOPY WITH RIGHT RETROGRADE,  URETEROSCOPY PYELOGRAM LASER LITHOTRIPSY,/URETERAL STENT PLACEMENT;  Surgeon: Kathie Rhodes, MD;  Location: [facility];  Service: Urology;  Laterality: Right;   HEMORRHOID SURGERY  01/03/2004   HOLMIUM LASER APPLICATION Right [id number]   Procedure: HOLMIUM LASER APPLICATION;  Surgeon: Kathie Rhodes, MD;  Location: [facility];  Service: Urology;  Laterality: Right;   LUMBAR DISC SURGERY     LUMBAR FUSION  1996   L4 --S1   ROTATOR CUFF REPAIR Left 2004   SHOULDER ARTHROSCOPY WITH BICEPS TENDON REPAIR Left 12/2015   SPINAL CORD STIMULATOR IMPLANT  x4  last one 2013   STONE  EXTRACTION WITH BASKET Right 04/02/2016   Procedure: STONE EXTRACTION WITH BASKET;  Surgeon: Kathie Rhodes, MD;  [facility];  Service: Urology;  Laterality: Right;   TONSILLECTOMY  1964   TRANSTHORACIC ECHOCARDIOGRAM  04/17/2014   EF 60-65%/  trivial AR, MR, and TR   UPPER GASTROINTESTINAL ENDOSCOPY     VAGINAL HYSTERECTOMY  1984   WRIST GANGLION EXCISION Right 02/05/2004    Family History  Problem Relation Age of Onset   Heart attack Mother        Mom 10, Dad 53   Hypertension Mother    Heart disease Mother    Heart disease Father    Diabetes Father    Colon cancer Maternal Grandmother        not sure age of onset   Esophageal cancer Neg Hx    Rectal cancer Neg Hx    Stomach cancer Neg Hx     Social:  reports that she quit smoking about 13 years ago. Her smoking use included cigarettes. She has a 50.00 pack-year smoking history. She has never used smokeless tobacco. She reports current alcohol use. She reports that she does not use drugs.  Allergies:  Allergies  Allergen Reactions   Latex Other (See Comments)    blisters   Oxycodone Nausea And Vomiting    And sweating   Zofran [Ondansetron Hcl] Other (See Comments)    headache    Medications: I have reviewed the patient's current medications.  No results found for this or any previous visit (from the past 48 hour(s)).  No results found.  ROS - all of the below systems have been reviewed with the patient and positives are indicated with bold text General: chills, fever or night sweats Eyes: blurry vision or double vision ENT: epistaxis or sore throat Allergy/Immunology: itchy/watery eyes or nasal congestion Hematologic/Lymphatic: bleeding problems, blood clots or swollen lymph nodes Endocrine: temperature intolerance or unexpected weight changes Breast: new or changing breast lumps or nipple discharge Resp: cough, shortness of breath, or wheezing CV: chest pain or dyspnea on exertion GI:  as per HPI GU: dysuria, trouble voiding, or hematuria MSK: joint pain or joint stiffness Neuro: TIA or stroke symptoms Derm: pruritus and skin lesion changes Psych: anxiety and depression  PE Blood pressure (!) 142/78, pulse (!) 58, temperature 97.9 F (36.6 C), temperature source Oral, resp. rate 18, weight 71.2 kg, SpO2 100 %. Constitutional: NAD; conversant Eyes: Moist conjunctiva; no lid lag; anicteric Lungs: Normal respiratory effort CV: RRR GI: Abd soft, NT/ND; no palpable hepatosplenomegaly MSK: Normal range of motion of extremities; no clubbing/cyanosis Psychiatric: Appropriate affect; alert and oriented x3  No results found for this or any previous visit (from the past 48 hour(s)).  No results found.   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 Harrington Challenger -The anatomy and physiology of the GI tract was reviewed with the patient again today. The pathophysiology of colon polyps and cancer was discussed as well. -We have reviewed various different treatment options going forward including surgery (the most definitive) to address this -laparoscopic right hemicolectomy -The planned procedure, material risks (including, but not limited to, pain, bleeding, infection, scarring, need for blood transfusion, damage to surrounding structures- blood vessels/nerves/viscus/organs, damage to ureter, leak from anastomosis, need for additional procedures, potential need for stoma which may be permanent, worsening of pre-existing medical conditions, hernia, recurrence, pneumonia, heart attack, stroke, death) benefits and alternatives to surgery were discussed at length. The patient's questions were answered  to her satisfaction again today, she voiced understanding and elected to proceed with surgery. Additionally, we discussed typical postoperative expectations and the recovery process.  Nadeen Landau, MD [facility] Use [email or web address] to contact on call provider

## 2021-06-12 NOTE — Transfer of Care (Signed)
Immediate Anesthesia Transfer of Care Note  Patient: Alison Brooks  Procedure(s) Performed: LAPAROSCOPIC RIGHT HEMI COLECTOMY  Patient Location: PACU  Anesthesia Type:General  Level of Consciousness: awake, alert , oriented and patient cooperative  Airway & Oxygen Therapy: Patient Spontanous Breathing and Patient connected to face mask oxygen  Post-op Assessment: Report given to RN, Post -op Vital signs reviewed and stable and Patient moving all extremities  Post vital signs: Reviewed and stable  Last Vitals:  Vitals Value Taken Time  BP 141/92 06/12/21 1545  Temp 36.6 C 06/12/21 1545  Pulse 76 06/12/21 1559  Resp 15 06/12/21 1559  SpO2 100 % 06/12/21 1559  Vitals shown include unvalidated device data.  Last Pain:  Vitals:   06/12/21 1545  TempSrc:   PainSc: 4          Complications: No notable events documented.

## 2021-06-12 NOTE — Anesthesia Postprocedure Evaluation (Signed)
Anesthesia Post Note  Patient: Alison Brooks  Procedure(s) Performed: LAPAROSCOPIC RIGHT HEMI COLECTOMY     Patient location during evaluation: PACU Anesthesia Type: General Level of consciousness: awake and alert and oriented Pain management: pain level controlled Vital Signs Assessment: post-procedure vital signs reviewed and stable Respiratory status: spontaneous breathing, nonlabored ventilation and respiratory function stable Cardiovascular status: blood pressure returned to baseline Postop Assessment: no apparent nausea or vomiting Anesthetic complications: no   No notable events documented.  Last Vitals:  Vitals:   06/12/21 1645 06/12/21 1712  BP: 128/68 132/63  Pulse: 64 63  Resp: 18 16  Temp:    SpO2: 99% 98%                  Marthenia Rolling

## 2021-06-12 NOTE — Op Note (Signed)
PATIENT: Alison Brooks  65 y.o. female  Patient Care Team: [name], MD as PCP - General (Family Medicine) Madelin Rear, [facility] as Pharmacist (Pharmacist)  PREOP DIAGNOSIS: Endoscopically unresectable polyp/mass of hepatic flexure  POSTOP DIAGNOSIS: Same  PROCEDURE:  Laparoscopic right hemicolectomy Partial omentectomy  SURGEON: Sharon Mt. Nayelie Gionfriddo, MD  ASSISTANT: Leighton Ruff, MD  ANESTHESIA: General endotracheal  EBL: 100 mL Total I/O In: 100 [IV Piggyback:100] Out: 100 [Blood:100]  DRAINS: None  SPECIMEN: Right colon (with appendix, terminal ileum en bloc), omentum  COUNTS: Sponge, needle and instrument counts were reported correct x2  FINDINGS:  Omental adhesions to both lateral abdominal walls lysed. Tattoo evident in the hepatic flexure. Laparoscopic right hemicolectomy carried out. Oozing from multiple omental surfaces related to prior adhesions - partial omentectomy carried out to ensure hemostasis.   NARRATIVE:  The patient was identified & brought into the operating room, placed supine on the operating table and SCDs were applied to the lower extremities. General endotracheal anesthesia was induced. The patient was positioned supine with arms tucked. Antibiotics were administered. A foley catheter was placed under sterile conditions. Hair in the region of planned surgery was clipped. The abdomen was prepped and draped in a sterile fashion. A timeout was performed confirming our patient and plan.   Beginning with the extraction port, a supraumbilical incision was made and carried down to the midline fascia. This was then incised with electrocautery. The peritoneum was identified and elevated between clamps and carefully opened sharply. A small Alexis wound protector with a cap and associated port was then placed. The abdomen was insufflated to 15 mmHg with CO2. A laparoscope was placed and camera inspection revealed no evidence of injury. Bilateral TAP blocks  were then performed under laparoscopic visualization using a mixture of 0.25% marcaine with epinepherine + Exparel. 3 additional ports were then placed under direct laparoscopic visualization - two in the left hemiabdomen and one in the right abdomen. The abdomen was surveyed. There are omental adhesions in the lateral abdomen on both sides. This is taken down using the Enseal device to facilitate the procedure. The liver and peritoneum appeared normal.  There were no signs of metastatic disease.  The patient was positioned in trendelenburg with left side down. The cecum was elevated. Attachments of the cecum and terminal ileum to the lateral abdominal wall and portion of the retroperitoneum are taken down. The ileocolic pedicle was identified and overlying peritoneum incised. Gentle blunt dissection commenced around the pedicle. The duodenum is identified superior and medial to this pedicle. The duodenum was freed from the surrounding structures, sweeping this 'down.'  This plane was developed cephalad up to the level of the hepatic flexure.  The ileocolic pedicle had been circumferentially dissected.  The duodenum was again confirmed be well away from our plane of dissection.  The ileocolic pedicle was then sealed/ligated and divided using the Enseal device.  The pedicle was inspected and noted to be hemostatic  The cecum and ascending colon were then mobilized medially by incising the Keishaun Hazel line of Toldt up to the level of the hepatic flexure.  The associated mesocolon has been mobilized.  The cecum easily reaches into the left upper quadrant without difficulty.  She is then repositioned in reverse Trendelenburg.  The omentum was elevated anteriorly and the colon retracted inferiorly.  The lesser sac is entered at the level of the midportion of the transverse colon.  The omentum was then mobilized fully off the colon.  The hepatic flexure  was then fully mobilized from this approach.  The tattoo is at the  level of the hepatic flexure.  At this point, the abdomen was desufflated and the terminal ileum and right colon delivered through the wound protector. The terminal ileum was then transected using a GIA blue load stapler.  A Babcock clamp was placed on this to assist in maintaining orientation.  The colon is eviscerated.  There is a palpable lesion at the level of the tattoo.  Going at least 5 cm distal to this, a distal point of transection is identified that includes the right branch of the middle colic artery.  This leaves the middle colic pedicle supplying the transverse colon.  A window was created the mesentery and the colon was divided using a GIA blue load stapler.  The intervening mesentery including the right branch of middle colic artery was ligated using the vessel sealer.  The cut edge of the mesentery is inspected noted to be hemostatic.  The specimen is passed off.  Attention was turned to creating the ileocolic anastomosis.  The ileum was inspected for orientation to ensure no twisting.  An enterotomy was created and a colotomy created on the transverse colon.  An anastomosis was created using a 75 mm GIA blue load stapler along the tinea of the transverse colon.  A 3-0 silk stitch was placed at the apex of the anastomosis.  The common enterotomy is then closed using a TA 90 blue load stapler.  Contents were milked to and fro and there is no extravasation of succus.  The corners of the TA staple line were then dunked using 3-0 silk suture.  The staple lines were inspected and hemostatic.  There was some oozing omentum that despite attempts at suture repair appeared to be more raw surface.  Therefore, a partial omentectomy was carried out using Enseal device.  The omentum was now hemostatic.  The removed omentum was passed off with the right colon specimen.  The anastomosis was palpated and noted to be widely patent. This was then placed back into the abdomen. The abdomen was then irrigated  with sterile saline and hemostasis verified. The omentum was then brought down over the anastomosis. The wound protector cap was replaced and CO2 reinsufflated. The laparoscopic ports were removed under direct visualization and the sites noted to be hemostatic. The Alexis wound protector was removed, counts were reported correct, and we switched to clean instruments, gowns and drapes.   The fascia was then closed using two running #1 PDS sutures.  The skin of all incision sites was closed with 4-0 monocryl subcuticular suture. Dermabond was placed on the port sites and a sterile dressing was placed over the abdominal incision. Sponge, needle and instrument counts were reported correct x2. The patient was then awakened from anesthesia, extubated and sent to the post anesthesia care unit in stable condition.   DISPOSITION: PACU in satisfactory condition

## 2021-06-12 NOTE — Anesthesia Procedure Notes (Signed)
Procedure Name: Intubation Date/Time: 06/12/2021 1:32 PM Performed by: Genelle Bal, CRNA Pre-anesthesia Checklist: Patient identified, Emergency Drugs available, Suction available and Patient being monitored Patient Re-evaluated:Patient Re-evaluated prior to induction Oxygen Delivery Method: Circle system utilized Preoxygenation: Pre-oxygenation with 100% oxygen Induction Type: IV induction Ventilation: Mask ventilation without difficulty Laryngoscope Size: Miller and 2 Grade View: Grade I Tube type: Oral Tube size: 7.0 mm Number of attempts: 1 Airway Equipment and Method: Stylet and Oral airway Placement Confirmation: ETT inserted through vocal cords under direct vision, positive ETCO2 and breath sounds checked- equal and bilateral Secured at: 20 cm Tube secured with: Tape Dental Injury: Teeth and Oropharynx as per pre-operative assessment

## 2021-06-13 ENCOUNTER — Encounter (HOSPITAL_COMMUNITY): Payer: Self-pay | Admitting: Surgery

## 2021-06-13 ENCOUNTER — Other Ambulatory Visit: Payer: Self-pay

## 2021-06-13 LAB — CBC
HCT: 36.1 % (ref 36.0–46.0)
Hemoglobin: 11.5 g/dL — ABNORMAL LOW (ref 12.0–15.0)
MCH: 31.7 pg (ref 26.0–34.0)
MCHC: 31.9 g/dL (ref 30.0–36.0)
MCV: 99.4 fL (ref 80.0–100.0)
Platelets: 151 10*3/uL (ref 150–400)
RBC: 3.63 MIL/uL — ABNORMAL LOW (ref 3.87–5.11)
RDW: 14 % (ref 11.5–15.5)
WBC: 6.8 10*3/uL (ref 4.0–10.5)
nRBC: 0 % (ref 0.0–0.2)

## 2021-06-13 LAB — BASIC METABOLIC PANEL
Anion gap: 6 (ref 5–15)
BUN: 14 mg/dL (ref 8–23)
CO2: 22 mmol/L (ref 22–32)
Calcium: 8.6 mg/dL — ABNORMAL LOW (ref 8.9–10.3)
Chloride: 109 mmol/L (ref 98–111)
Creatinine, Ser: 0.84 mg/dL (ref 0.44–1.00)
GFR, Estimated: >60 mL/min (ref >60)
Glucose, Bld: 112 mg/dL — ABNORMAL HIGH (ref 70–99)
Potassium: 3.6 mmol/L (ref 3.5–5.1)
Sodium: 137 mmol/L (ref 135–145)

## 2021-06-13 MED ORDER — TRAMADOL HCL 50 MG PO TABS: 50.0000 mg | ORAL_TABLET | Freq: Four times a day (QID) | ORAL | 0 refills | Status: AC | PRN

## 2021-06-13 NOTE — Plan of Care (Signed)
  Problem: Pain Managment: Goal: General experience of comfort will improve Outcome: Progressing   Problem: Safety: Goal: Ability to remain free from injury will improve Outcome: Progressing   Problem: Coping: Goal: Level of anxiety will decrease Outcome: Progressing   

## 2021-06-13 NOTE — Progress Notes (Signed)
Subjective No acute events. Feeling well. No nausea/vomiting. Tolerating clears and some grits without any issues. No flatus/bm yet.  Objective: Vital signs in last 24 hours: Temp:  [97.8 F (36.6 C)-98.3 F (36.8 C)] 98.3 F (36.8 C) (08/20 0914) Pulse Rate:  [58-77] 63 (08/20 0914) Resp:  [11-18] 16 (08/20 0542) BP: (96-142)/(48-93) 105/62 (08/20 0914) SpO2:  [95 %-100 %] 96 % (08/20 0914) Weight:  [71.2 kg] 71.2 kg (08/19 2300) Last BM Date: 06/11/21  Intake/Output from previous day: 08/19 0701 - 08/20 0700 In: 2515.1 [P.O.:480; I.V.:1935.1; IV Piggyback:100] Out: 850 [Urine:750; Blood:100] Intake/Output this shift: Total I/O In: 360 [P.O.:360] Out: 200 [Urine:200]  Gen: NAD, comfortable CV: RRR Pulm: Normal work of breathing Abd: Soft, appropriate incisional tenderness, nondistended. Incisions c/d/I without erythema or drainage Ext: SCDs in place  Lab Results: CBC  Recent Labs    06/13/21 0430  WBC 6.8  HGB 11.5*  HCT 36.1  PLT 151   BMET Recent Labs    06/13/21 0430  NA 137  K 3.6  CL 109  CO2 22  GLUCOSE 112*  BUN 14  CREATININE 0.84  CALCIUM 8.6*   PT/INR No results for input(s): LABPROT, INR in the last 72 hours. ABG No results for input(s): PHART, HCO3 in the last 72 hours.  Invalid input(s): PCO2, PO2  Studies/Results:  Anti-infectives: Anti-infectives (From admission, onward)    Start     Dose/Rate Route Frequency Ordered Stop   06/12/21 1400  neomycin (MYCIFRADIN) tablet 1,000 mg  Status:  Discontinued       See Hyperspace for full Linked Orders Report.   1,000 mg Oral 3 times per day 06/12/21 1154 06/12/21 1211   06/12/21 1400  metroNIDAZOLE (FLAGYL) tablet 1,000 mg  Status:  Discontinued       See Hyperspace for full Linked Orders Report.   1,000 mg Oral 3 times per day 06/12/21 1154 06/12/21 1211   06/12/21 1200  cefoTEtan (CEFOTAN) 2 g in sodium chloride 0.9 % 100 mL IVPB        2 g 200 mL/hr over 30 Minutes Intravenous On  call to O.R. 06/12/21 1154 06/12/21 1411        Assessment/Plan: Patient Active Problem List   Diagnosis Date Noted   S/P right hemicolectomy 06/12/2021   Aortic atherosclerosis ([place]) 03/31/2021   COPD (chronic obstructive pulmonary disease) ([place]) 11/24/2020   History of total hysterectomy 07/17/2019   Hypothyroidism 07/17/2019   Vertigo 11/06/2016   Dyspnea 11/02/2014   Upper airway cough syndrome 11/02/2014   Gout 06/28/2014   Vitamin B12 deficiency 06/28/2014   Multiple pulmonary nodules 06/28/2014   Essential hypertension 05/17/2014   Obesity (BMI 30-39.9) 01/14/2014   Lateral epicondylitis (tennis elbow) 01/02/2014   GERD (gastroesophageal reflux disease) 03/26/2013   LEG CRAMPS 05/16/2009   RESTLESS LEG SYNDROME, SEVERE 03/14/2009   Chronic interstitial cystitis 01/20/2009   Depression, major, recurrent ([place]) 03/19/2008   TUBULOVILLOUS ADENOMA, COLON 02/17/2008   Hyperlipidemia 02/17/2008   NEPHROLITHIASIS 02/17/2008   Former smoker 12/21/2007   MIGRAINE, CLASSICAL W/O INTRACTABLE MIGRAINE 06/09/2007   SYMPTOM, MEMORY LOSS 06/09/2007   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 We have reviewed her procedure, findings, and plans. All questions answered -Full liquids, advancing as tolerated; D/C IVF -Ppx: SQH, SCDs Path pending   LOS: 1 day   Nadeen Landau, MD [facility]  Surgery Use [email or web address] to contact on call provider

## 2021-06-13 NOTE — Evaluation (Signed)
Physical Therapy Evaluation Patient Details Name: Alison Brooks MRN: [id number] DOB: Jul 23, 1956 Today's Date: 06/13/2021   History of Present Illness  65 yo with RCR,  lumbar surgery, HTN, HLD, skin cancer, long covid, COPD,  gout, spinal cord stimulator which is currently not working; previously on methadone but is no longer taking any narcotics - here for surgery regarding a recurrent endoscopically unresectable polypoid lesion at hepatic flexure.   s/p Laparoscopic right hemicolectomy,  Partial omentectomy on 06/12/21  Clinical Impression  Pt admitted with above diagnosis.  Pt with some nausea but agreeable to amb. Reviewed log roll to decr abd pain with bed mobility. Will benefit from continued mobility, do no think she will need f/u   Pt currently with functional limitations due to the deficits listed below (see PT Problem List). Pt will benefit from skilled PT to increase their independence and safety with mobility to allow discharge to the venue listed below.       Follow Up Recommendations No PT follow up    Equipment Recommendations  None recommended by PT    Recommendations for Other Services       Precautions / Restrictions Precautions Precautions: None Restrictions Weight Bearing Restrictions: No      Mobility  Bed Mobility Overal bed mobility: Needs Assistance Bed Mobility: Rolling;Sidelying to Sit;Sit to Sidelying Rolling: Supervision Sidelying to sit: Min guard     Sit to sidelying: Min assist General bed mobility comments: cues to log roll, assist to lift bil LEs on to bed d/t abd pain    Transfers Overall transfer level: Needs assistance   Transfers: Sit to/from Stand Sit to Stand: Supervision         General transfer comment: for safety, no dizziness, mild nausea  Ambulation/Gait Ambulation/Gait assistance: Min guard Gait Distance (Feet): 140 Feet Assistive device: None Gait Pattern/deviations: Step-through pattern;Drifts right/left      General Gait Details: drifting and initially unsteady however without overt LOB  Stairs            Wheelchair Mobility    Modified Rankin (Stroke Patients Only)       Balance Overall balance assessment: Mild deficits observed, not formally tested                                           Pertinent Vitals/Pain Pain Assessment: Faces Faces Pain Scale: Hurts little more Pain Location: abd with activity Pain Intervention(s): Limited activity within patient's tolerance;Monitored during session    Home Living Family/patient expects to be discharged to:: Private residence Living Arrangements: Spouse/significant other Available Help at Discharge: Available PRN/intermittently Type of Home: House         Home Equipment: None      Prior Function Level of Independence: Independent               Hand Dominance        Extremity/Trunk Assessment   Upper Extremity Assessment Upper Extremity Assessment: Overall WFL for tasks assessed    Lower Extremity Assessment Lower Extremity Assessment: Overall WFL for tasks assessed       Communication   Communication: No difficulties  Cognition Arousal/Alertness: Awake/alert Behavior During Therapy: WFL for tasks assessed/performed Overall Cognitive Status: Within Functional Limits for tasks assessed  General Comments      Exercises     Assessment/Plan    PT Assessment Patient needs continued PT services  PT Problem List Decreased mobility;Decreased activity tolerance       PT Treatment Interventions Gait training;Therapeutic activities;Functional mobility training;Therapeutic exercise;Patient/family education    PT Goals (Current goals can be found in the Care Plan section)  Acute Rehab PT Goals Patient Stated Goal: feel better PT Goal Formulation: With patient Time For Goal Achievement: 06/27/21 Potential to Achieve Goals:  Good    Frequency Min 2X/week   Barriers to discharge        Co-evaluation               AM-PAC PT "6 Clicks" Mobility  Outcome Measure Help needed turning from your back to your side while in a flat bed without using bedrails?: None Help needed moving from lying on your back to sitting on the side of a flat bed without using bedrails?: A Little Help needed moving to and from a bed to a chair (including a wheelchair)?: A Little Help needed standing up from a chair using your arms (e.g., wheelchair or bedside chair)?: A Little Help needed to walk in hospital room?: A Little Help needed climbing 3-5 steps with a railing? : A Little 6 Click Score: 19    End of Session   Activity Tolerance: Patient tolerated treatment well;Other (comment) (distance limted by nausea) Patient left: in bed;with call bell/phone within reach;with bed alarm set   PT Visit Diagnosis: Other abnormalities of gait and mobility (R26.89)    Time: [id number] PT Time Calculation (min) (ACUTE ONLY): 16 min   Charges:   PT Evaluation $PT Eval Low Complexity: [place], PT  Acute Rehab Dept ([place]) [phone] Pager [phone]  06/13/2021   [facility] 06/13/2021, 3:17 PM

## 2021-06-13 NOTE — Discharge Instructions (Signed)
POST OP INSTRUCTIONS AFTER COLON SURGERY  DIET: Be sure to include lots of fluids daily to stay hydrated - 64oz of water per day (8, 8 oz glasses).  Avoid fast food or heavy meals for the first couple of weeks as your are more likely to get nauseated. Avoid raw/uncooked fruits or vegetables for the first 4 weeks (its ok to have these if they are blended into smoothie form). If you have fruits/vegetables, make sure they are cooked until soft enough to mash on the roof of your mouth and chew your food well. Otherwise, diet as tolerated.  Take your usually prescribed home medications unless otherwise directed.  PAIN CONTROL: Pain is best controlled by a usual combination of three different methods TOGETHER: Ice/Heat Over the counter pain medication Prescription pain medication Most patients will experience some swelling and bruising around the surgical site.  Ice packs or heating pads (30-60 minutes up to 6 times a day) will help. Some people prefer to use ice alone, heat alone, alternating between ice & heat.  Experiment to what works for you.  Swelling and bruising can take several weeks to resolve.   It is helpful to take an over-the-counter pain medication regularly for the first few weeks: Ibuprofen (Motrin/Advil) - 200mg tabs - take 3 tabs (600mg) every 6 hours as needed for pain (unless you have been directed previously to avoid NSAIDs/ibuprofen) Acetaminophen (Tylenol) - you may take 650mg every 6 hours as needed. You can take this with motrin as they act differently on the body. If you are taking a narcotic pain medication that has acetaminophen in it, do not take over the counter tylenol at the same time. NOTE: You may take both of these medications together - most patients  find it most helpful when alternating between the two (i.e. Ibuprofen at 6am, tylenol at 9am, ibuprofen at 12pm ...) A  prescription for pain medication should be given to you upon discharge.  Take your pain medication as  prescribed if your pain is not adequatly controlled with the over-the-counter pain reliefs mentioned above.  Avoid getting constipated.  Between the surgery and the pain medications, it is common to experience some constipation.  Increasing fluid intake and taking a fiber supplement (such as Metamucil, Citrucel, FiberCon, MiraLax, etc) 1-2 times a day regularly will usually help prevent this problem from occurring.  A mild laxative (prune juice, Milk of Magnesia, MiraLax, etc) should be taken according to package directions if there are no bowel movements after 48 hours.    Dressing: Your incisions are covered in Dermabond which is like sterile superglue for the skin. This will come off on it's own in a couple weeks. It is waterproof and you may bathe normally starting the day after your surgery in a shower. Avoid baths/pools/lakes/oceans until your wounds have fully healed.  ACTIVITIES as tolerated:   Avoid heavy lifting (>10lbs or 1 gallon of milk) for the next 6 weeks. You may resume regular daily activities as tolerated--such as daily self-care, walking, climbing stairs--gradually increasing activities as tolerated.  If you can walk 30 minutes without difficulty, it is safe to try more intense activity such as jogging, treadmill, bicycling, low-impact aerobics.  DO NOT PUSH THROUGH PAIN.  Let pain be your guide: If it hurts to do something, don't do it. You may drive when you are no longer taking prescription pain medication, you can comfortably wear a seatbelt, and you can safely maneuver your car and apply brakes.  FOLLOW UP in our   office Please call [facility] at [phone] to set up an appointment to see your surgeon in the office for a follow-up appointment approximately 2 weeks after your surgery. Make sure that you call for this appointment the day you arrive home to insure a convenient appointment time.  9. If you have disability or family leave forms that need to be completed, you may have  them completed by your primary care physician's office; for return to work instructions, please ask our office staff and they will be happy to assist you in obtaining this documentation   When to call us [phone]: Poor pain control Reactions / problems with new medications (rash/itching, etc)  Fever over 101.5 F (38.5 C) Inability to urinate Nausea/vomiting Worsening swelling or bruising Continued bleeding from incision. Increased pain, redness, or drainage from the incision  The clinic staff is available to answer your questions during regular business hours (8:30am-5pm).  Please don't hesitate to call and ask to speak to one of our nurses for clinical concerns.   A surgeon from [facility] is always on call at the hospitals   If you have a medical emergency, go to the nearest emergency room or call 911.  [facility] [address], [address], [place], [place]  [address] MAIN: [phone] FAX: [phone] [email or web address]  

## 2021-06-14 NOTE — Progress Notes (Signed)
  Subjective No acute events. Feeling well. No nausea/vomiting. Tolerating full liquids without any issues. No flatus/bm yet. Pain much better today  Objective: Vital signs in last 24 hours: Temp:  [98.3 F (36.8 C)-98.9 F (37.2 C)] 98.7 F (37.1 C) (08/21 0635) Pulse Rate:  [63-67] 67 (08/21 0635) Resp:  [18] 18 (08/21 0635) BP: (97-113)/(51-64) 97/51 (08/21 0635) SpO2:  [94 %-96 %] 95 % (08/21 0635) Last BM Date: 06/11/21  Intake/Output from previous day: 08/20 0701 - 08/21 0700 In: 740 [P.O.:740] Out: 1700 [Urine:1700] Intake/Output this shift: No intake/output data recorded.  Gen: NAD, comfortable CV: RRR Pulm: Normal work of breathing Abd: Soft, appropriate minimal incisional tenderness, nondistended. Incisions c/d/I without erythema or drainage Ext: SCDs in place  Lab Results: CBC  Recent Labs    06/13/21 0430  WBC 6.8  HGB 11.5*  HCT 36.1  PLT 151   BMET Recent Labs    06/13/21 0430  NA 137  K 3.6  CL 109  CO2 22  GLUCOSE 112*  BUN 14  CREATININE 0.84  CALCIUM 8.6*   PT/INR No results for input(s): LABPROT, INR in the last 72 hours. ABG No results for input(s): PHART, HCO3 in the last 72 hours.  Invalid input(s): PCO2, PO2  Studies/Results:  Anti-infectives: Anti-infectives (From admission, onward)    Start     Dose/Rate Route Frequency Ordered Stop   06/12/21 1400  neomycin (MYCIFRADIN) tablet 1,000 mg  Status:  Discontinued       See Hyperspace for full Linked Orders Report.   1,000 mg Oral 3 times per day 06/12/21 1154 06/12/21 1211   06/12/21 1400  metroNIDAZOLE (FLAGYL) tablet 1,000 mg  Status:  Discontinued       See Hyperspace for full Linked Orders Report.   1,000 mg Oral 3 times per day 06/12/21 1154 06/12/21 1211   06/12/21 1200  cefoTEtan (CEFOTAN) 2 g in sodium chloride 0.9 % 100 mL IVPB        2 g 200 mL/hr over 30 Minutes Intravenous On call to O.R. 06/12/21 1154 06/12/21 1411        Assessment/Plan: Patient Active  Problem List   Diagnosis Date Noted   S/P right hemicolectomy 06/12/2021   Aortic atherosclerosis ([place]) 03/31/2021   COPD (chronic obstructive pulmonary disease) ([place]) 11/24/2020   History of total hysterectomy 07/17/2019   Hypothyroidism 07/17/2019   Vertigo 11/06/2016   Dyspnea 11/02/2014   Upper airway cough syndrome 11/02/2014   Gout 06/28/2014   Vitamin B12 deficiency 06/28/2014   Multiple pulmonary nodules 06/28/2014   Essential hypertension 05/17/2014   Obesity (BMI 30-39.9) 01/14/2014   Lateral epicondylitis (tennis elbow) 01/02/2014   GERD (gastroesophageal reflux disease) 03/26/2013   LEG CRAMPS 05/16/2009   RESTLESS LEG SYNDROME, SEVERE 03/14/2009   Chronic interstitial cystitis 01/20/2009   Depression, major, recurrent ([place]) 03/19/2008   TUBULOVILLOUS ADENOMA, COLON 02/17/2008   Hyperlipidemia 02/17/2008   NEPHROLITHIASIS 02/17/2008   Former smoker 12/21/2007   MIGRAINE, CLASSICAL W/O INTRACTABLE MIGRAINE 06/09/2007   SYMPTOM, MEMORY LOSS 06/09/2007   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

## 2021-06-15 ENCOUNTER — Telehealth: Payer: Self-pay | Admitting: Pharmacist

## 2021-06-15 LAB — CBC WITH DIFFERENTIAL/PLATELET
Abs Immature Granulocytes: 0.05 10*3/uL (ref 0.00–0.07)
Basophils Absolute: 0.1 10*3/uL (ref 0.0–0.1)
Basophils Relative: 1 %
Eosinophils Absolute: 0.2 10*3/uL (ref 0.0–0.5)
Eosinophils Relative: 2 %
HCT: 38.5 % (ref 36.0–46.0)
Hemoglobin: 12.2 g/dL (ref 12.0–15.0)
Immature Granulocytes: 1 %
Lymphocytes Relative: 31 %
Lymphs Abs: 2.8 10*3/uL (ref 0.7–4.0)
MCH: 31.4 pg (ref 26.0–34.0)
MCHC: 31.7 g/dL (ref 30.0–36.0)
MCV: 99 fL (ref 80.0–100.0)
Monocytes Absolute: 0.8 10*3/uL (ref 0.1–1.0)
Monocytes Relative: 8 %
Neutro Abs: 5.2 10*3/uL (ref 1.7–7.7)
Neutrophils Relative %: 57 %
Platelets: 174 10*3/uL (ref 150–400)
RBC: 3.89 MIL/uL (ref 3.87–5.11)
RDW: 14.1 % (ref 11.5–15.5)
WBC: 9 10*3/uL (ref 4.0–10.5)
nRBC: 0 % (ref 0.0–0.2)

## 2021-06-15 LAB — SURGICAL PATHOLOGY

## 2021-06-15 NOTE — Progress Notes (Signed)
Subjective No acute events. Feeling well. No n/v. Has had 2 bowel movements, passing gas. Tolerating soft diet. Pain well controlled. Mobilizing well on her own  Objective: Vital signs in last 24 hours: Temp:  [98.1 F (36.7 C)-98.7 F (37.1 C)] 98.1 F (36.7 C) (08/22 0602) Pulse Rate:  [56-69] 56 (08/22 0602) Resp:  [16-18] 18 (08/22 0602) BP: (103-121)/(54-65) 120/57 (08/22 0602) SpO2:  [95 %-99 %] 95 % (08/22 0716) Last BM Date: 06/14/21  Intake/Output from previous day: 08/21 0701 - 08/22 0700 In: 720 [P.O.:720] Out: 0  Intake/Output this shift: Total I/O In: 120 [P.O.:120] Out: -   Gen: NAD, comfortable CV: RRR Pulm: Normal work of breathing Abd: Soft, appropriate minimal incisional tenderness, nondistended. Incisions c/d/I without erythema or drainage Ext: SCDs in place  Lab Results: CBC  Recent Labs    06/13/21 0430 06/15/21 0400  WBC 6.8 9.0  HGB 11.5* 12.2  HCT 36.1 38.5  PLT 151 174   BMET Recent Labs    06/13/21 0430  NA 137  K 3.6  CL 109  CO2 22  GLUCOSE 112*  BUN 14  CREATININE 0.84  CALCIUM 8.6*   PT/INR No results for input(s): LABPROT, INR in the last 72 hours. ABG No results for input(s): PHART, HCO3 in the last 72 hours.  Invalid input(s): PCO2, PO2  Studies/Results:  Anti-infectives: Anti-infectives (From admission, onward)    Start     Dose/Rate Route Frequency Ordered Stop   06/12/21 1400  neomycin (MYCIFRADIN) tablet 1,000 mg  Status:  Discontinued       See Hyperspace for full Linked Orders Report.   1,000 mg Oral 3 times per day 06/12/21 1154 06/12/21 1211   06/12/21 1400  metroNIDAZOLE (FLAGYL) tablet 1,000 mg  Status:  Discontinued       See Hyperspace for full Linked Orders Report.   1,000 mg Oral 3 times per day 06/12/21 1154 06/12/21 1211   06/12/21 1200  cefoTEtan (CEFOTAN) 2 g in sodium chloride 0.9 % 100 mL IVPB        2 g 200 mL/hr over 30 Minutes Intravenous On call to O.R. 06/12/21 1154 06/12/21 1411         Assessment/Plan: Patient Active Problem List   Diagnosis Date Noted   S/P right hemicolectomy 06/12/2021   Aortic atherosclerosis ([place]) 03/31/2021   COPD (chronic obstructive pulmonary disease) ([place]) 11/24/2020   History of total hysterectomy 07/17/2019   Hypothyroidism 07/17/2019   Vertigo 11/06/2016   Dyspnea 11/02/2014   Upper airway cough syndrome 11/02/2014   Gout 06/28/2014   Vitamin B12 deficiency 06/28/2014   Multiple pulmonary nodules 06/28/2014   Essential hypertension 05/17/2014   Obesity (BMI 30-39.9) 01/14/2014   Lateral epicondylitis (tennis elbow) 01/02/2014   GERD (gastroesophageal reflux disease) 03/26/2013   LEG CRAMPS 05/16/2009   RESTLESS LEG SYNDROME, SEVERE 03/14/2009   Chronic interstitial cystitis 01/20/2009   Depression, major, recurrent ([place]) 03/19/2008   TUBULOVILLOUS ADENOMA, COLON 02/17/2008   Hyperlipidemia 02/17/2008   NEPHROLITHIASIS 02/17/2008   Former smoker 12/21/2007   MIGRAINE, CLASSICAL W/O INTRACTABLE MIGRAINE 06/09/2007   SYMPTOM, MEMORY LOSS 06/09/2007   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

## 2021-06-15 NOTE — Chronic Care Management (AMB) (Addendum)
Chronic Care Management Pharmacy Assistant   Name: Alison Brooks  MRN: [id number] DOB: 02-Nov-1955  Reason for Encounter: General Adherence Call    Recent office visits:  None  Recent consult visits:  06/03/2021 OV (cardiology) Fay Records, MD; visit for dyspnea, set up for a lexiscan, no medication changes indicated.  Hospital visits:  06/12/2021 Admission for Laparoscopic Right Hemi Colectomy  Medications: Facility-Administered Encounter Medications as of 06/15/2021  Medication   acetaminophen (TYLENOL) tablet 1,000 mg   albuterol (PROVENTIL) (2.5 MG/3ML) 0.083% nebulizer solution 3 mL   allopurinol (ZYLOPRIM) tablet 100 mg   alum & mag hydroxide-simeth (MAALOX/MYLANTA) 200-200-20 MG/5ML suspension 30 mL   alvimopan (ENTEREG) capsule 12 mg   amLODipine (NORVASC) tablet 2.5 mg   baclofen (LIORESAL) tablet 5 mg   diclofenac sodium (VOLTAREN) 1 % transdermal gel 4 g   diphenhydrAMINE (BENADRYL) 12.5 MG/5ML elixir 12.5 mg   Or   diphenhydrAMINE (BENADRYL) injection 12.5 mg   escitalopram (LEXAPRO) tablet 20 mg   ezetimibe (ZETIA) tablet 10 mg   feeding supplement (ENSURE SURGERY) liquid 237 mL   heparin injection 5,000 Units   hydrALAZINE (APRESOLINE) injection 10 mg   HYDROmorphone (DILAUDID) injection 0.5 mg   ibuprofen (ADVIL) tablet 600 mg   levothyroxine (SYNTHROID) tablet 50 mcg   oxybutynin (DITROPAN) tablet 5 mg   pantoprazole (PROTONIX) EC tablet 40 mg   prochlorperazine (COMPAZINE) tablet 10 mg   Or   prochlorperazine (COMPAZINE) injection 5-10 mg   rosuvastatin (CRESTOR) tablet 40 mg   simethicone (MYLICON) chewable tablet 40 mg   topiramate (TOPAMAX) tablet 200 mg   traMADol (ULTRAM) tablet 50 mg   umeclidinium bromide (INCRUSE ELLIPTA) 62.5 MCG/INH 1 puff   Outpatient Encounter Medications as of 06/15/2021  Medication Sig   albuterol (VENTOLIN HFA) 108 (90 Base) MCG/ACT inhaler INHALE 2 PUFFS EVERY 6 HOURS AS NEEDED.   allopurinol (ZYLOPRIM) 100  MG tablet TAKE 1 TABLET BY MOUTH  DAILY   amLODipine (NORVASC) 2.5 MG tablet TAKE 1 TABLET BY MOUTH  DAILY   aspirin EC 81 MG tablet Take 81 mg by mouth daily. Swallow whole.   baclofen (LIORESAL) 10 MG tablet TAKE 1/2 TO 1 TABLET BY  MOUTH 3 TIMES DAILY   cyanocobalamin (,VITAMIN B-12,) 1000 MCG/ML injection 1000 mcg (1 mg) injection once per per month or as directed   diclofenac sodium (VOLTAREN) 1 % GEL Apply 4 g topically 4 (four) times daily.   ELDERBERRY PO Take by mouth.   escitalopram (LEXAPRO) 20 MG tablet TAKE 1 TABLET BY MOUTH  DAILY   ezetimibe (ZETIA) 10 MG tablet TAKE 1 TABLET BY MOUTH  DAILY   famotidine (PEPCID) 20 MG tablet Take 1 tablet (20 mg total) by mouth at bedtime.   levothyroxine (SYNTHROID, LEVOTHROID) 50 MCG tablet Take 50 mcg by mouth daily before breakfast.   oxybutynin (DITROPAN) 5 MG tablet TAKE 1 TABLET BY MOUTH 2  TIMES DAILY AS NEEDED FOR  BLADDER SPASMS. (Patient taking differently: TAKE 1 TABLET BY MOUTH 2  TIMES DAILY AS NEEDED FOR  BLADDER SPASMS.)   pantoprazole (PROTONIX) 40 MG tablet TAKE 1 TABLET BY MOUTH  TWICE DAILY 30 TO 60  MINUTES BEFORE MEALS (Patient taking differently: TAKE 1 TABLET BY MOUTH  TWICE DAILY 30 TO 60  MINUTES BEFORE MEALS)   rosuvastatin (CRESTOR) 40 MG tablet TAKE 1 TABLET BY MOUTH  DAILY   Tiotropium Bromide Monohydrate (SPIRIVA RESPIMAT) 2.5 MCG/ACT AERS Inhale 2 puffs into the lungs daily.  topiramate (TOPAMAX) 200 MG tablet TAKE 1 TABLET BY MOUTH  DAILY   traMADol (ULTRAM) 50 MG tablet Take 1 tablet (50 mg total) by mouth every 6 (six) hours as needed for up to 5 days (postop pain not controlled with tylenol and ibuprofen first).   [DISCONTINUED] orlistat (XENICAL) 120 MG capsule Take 1 capsule (120 mg total) by mouth 3 (three) times daily with meals.   Patient Questions: Have you had any problems recently with your health? Patient states she just had surgery. She states she is going home today. She states she is feeling good  to just have surgery.  Have you had any problems with your pharmacy? Patient states she has not had any problems recently with her pharmacy.  What issues or side effects are you having with your medications? Patient denies having any issues or side effects with any of her medications.  What would you like me to pass along to Leata Mouse, CPP for him to help you with?  Patient states she does not have anything to pass along at this time.  What can we do to take care of you better? Patient did not have any suggestions.  Star Rating Drugs: Rosuvastatin 40 mg last filled 04/23/2021 90 DS  April D Calhoun, [place] Pharmacist Assistant [phone]   10 minutes spent in review, coordination, and documentation.  Reviewed by: Beverly Milch, [name] Clinical Pharmacist [phone]

## 2021-06-15 NOTE — Progress Notes (Signed)
Discharge instructions given to patient and all questions were answered.  

## 2021-06-15 NOTE — Discharge Summary (Signed)
Patient ID: Alison Brooks MRN: [id number] DOB/AGE: 1956-04-08 65 y.o.  Admit date: 06/12/2021 Discharge date: 06/15/2021  Discharge Diagnoses Patient Active Problem List   Diagnosis Date Noted   S/P right hemicolectomy 06/12/2021   Aortic atherosclerosis ([place]) 03/31/2021   COPD (chronic obstructive pulmonary disease) ([place]) 11/24/2020   History of total hysterectomy 07/17/2019   Hypothyroidism 07/17/2019   Vertigo 11/06/2016   Dyspnea 11/02/2014   Upper airway cough syndrome 11/02/2014   Gout 06/28/2014   Vitamin B12 deficiency 06/28/2014   Multiple pulmonary nodules 06/28/2014   Essential hypertension 05/17/2014   Obesity (BMI 30-39.9) 01/14/2014   Lateral epicondylitis (tennis elbow) 01/02/2014   GERD (gastroesophageal reflux disease) 03/26/2013   LEG CRAMPS 05/16/2009   RESTLESS LEG SYNDROME, SEVERE 03/14/2009   Chronic interstitial cystitis 01/20/2009   Depression, major, recurrent ([place]) 03/19/2008   TUBULOVILLOUS ADENOMA, COLON 02/17/2008   Hyperlipidemia 02/17/2008   NEPHROLITHIASIS 02/17/2008   Former smoker 12/21/2007   MIGRAINE, CLASSICAL W/O INTRACTABLE MIGRAINE 06/09/2007   SYMPTOM, MEMORY LOSS 06/09/2007   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  Her diet was gradually advanced.  On postop day #2, she began having spontaneous bowel function.  She was ambulating well on her own.  Her pain is well controlled on oral analgesics.  She had no nausea or vomiting with a soft diet.  On postop day #3, she was comfortable with and sable for discharge home.  Pathology is currently pending.    Allergies as of 06/15/2021       Reactions   Latex Other (See Comments)   blisters   Oxycodone Nausea And Vomiting   And sweating   Zofran [ondansetron Hcl]  Other (See Comments)   headache        Medication List     TAKE these medications    albuterol 108 (90 Base) MCG/ACT inhaler Commonly known as: VENTOLIN HFA INHALE 2 PUFFS EVERY 6 HOURS AS NEEDED.   allopurinol 100 MG tablet Commonly known as: ZYLOPRIM TAKE 1 TABLET BY MOUTH  DAILY   amLODipine 2.5 MG tablet Commonly known as: NORVASC TAKE 1 TABLET BY MOUTH  DAILY   aspirin EC 81 MG tablet Take 81 mg by mouth daily. Swallow whole.   baclofen 10 MG tablet Commonly known as: LIORESAL TAKE 1/2 TO 1 TABLET BY  MOUTH 3 TIMES DAILY   cyanocobalamin 1000 MCG/ML injection Commonly known as: (VITAMIN B-12) 1000 mcg (1 mg) injection once per per month or as directed   diclofenac sodium 1 % Gel Commonly known as: VOLTAREN Apply 4 g topically 4 (four) times daily.   ELDERBERRY PO Take by mouth.   escitalopram 20 MG tablet Commonly known as: LEXAPRO TAKE 1 TABLET BY MOUTH  DAILY   ezetimibe 10 MG tablet Commonly known as: ZETIA TAKE 1 TABLET BY MOUTH  DAILY   famotidine 20 MG tablet Commonly known as: Pepcid Take 1 tablet (20 mg total) by mouth at bedtime.   levothyroxine 50 MCG tablet Commonly known as: SYNTHROID Take 50 mcg by mouth daily before breakfast.   oxybutynin 5 MG tablet Commonly known as: DITROPAN TAKE 1 TABLET BY MOUTH 2  TIMES DAILY AS NEEDED FOR  BLADDER SPASMS.   pantoprazole 40 MG tablet Commonly known as: PROTONIX TAKE 1 TABLET BY MOUTH  TWICE DAILY 30 TO 60  MINUTES BEFORE MEALS   rosuvastatin 40 MG tablet Commonly known as: CRESTOR TAKE 1 TABLET BY MOUTH  DAILY   Spiriva Respimat 2.5 MCG/ACT Aers Generic drug: Tiotropium Bromide Monohydrate Inhale 2 puffs into the lungs daily.   topiramate 200 MG tablet Commonly known as: TOPAMAX TAKE 1 TABLET BY MOUTH  DAILY   traMADol 50 MG tablet Commonly known as: Ultram Take 1 tablet (50 mg total) by mouth every 6 (six) hours as needed for up to 5 days (postop pain not controlled with tylenol  and ibuprofen first).          Follow-up Information     Ileana Roup, MD Follow up.   Specialties: General Surgery, Colon and Rectal Surgery Contact information: [place] [address] Dema Severin, M.D. [facility]

## 2021-09-24 ENCOUNTER — Other Ambulatory Visit: Payer: Self-pay

## 2021-09-24 ENCOUNTER — Ambulatory Visit: Payer: Medicare Other | Attending: Surgery | Admitting: Physical Therapy

## 2021-09-24 ENCOUNTER — Encounter: Payer: Self-pay | Admitting: Physical Therapy

## 2021-09-24 DIAGNOSIS — M6281 Muscle weakness (generalized): Secondary | ICD-10-CM | POA: Insufficient documentation

## 2021-09-24 DIAGNOSIS — R279 Unspecified lack of coordination: Secondary | ICD-10-CM | POA: Diagnosis not present

## 2021-09-24 NOTE — Therapy (Signed)
[facility] @ [address] [place], [place], [address] Phone: [phone]   Fax:  [phone]  Physical Therapy Evaluation  Patient Details  Name: Alison Brooks MRN: [id number] Date of Birth: [birth year] Referring Provider (PT): Ileana Roup, MD   Encounter Date: 09/24/2021   PT End of Session - 09/24/21 1400     Visit Number 1    Date for PT Re-Evaluation 12/17/21    Authorization Type U[employer] medicare    PT Start Time 1400    PT Stop Time 7078    PT Time Calculation (min) 38 min    Activity Tolerance Patient tolerated treatment well    Behavior During Therapy [facility] for tasks assessed/performed             Past Medical History:  Diagnosis Date   Anxiety    Arthritis    back, neck   Chronic back pain    COPD (chronic obstructive pulmonary disease) ([place])    Depression    Dyspnea    Since covid , NOW ON INHALERS   Fatty liver    Fibromyalgia    GERD (gastroesophageal reflux disease)    Heart murmur    Hematuria    History of adenomatous polyp of colon    History of COVID-19 10/2020   History of kidney stones    History of seizure    x1 2007 post op (per pt neurologist work-up done ?mixture of anesthesia medication and prozac that pt had taken)  and has not any issues since    History of squamous cell carcinoma excision    Hyperlipidemia    Hypertension    Hypothyroidism    IC (interstitial cystitis)    Long COVID    Migraine    Osteoporosis    Pneumonia    history of   Pulmonary nodules    multiple per ct -- monitored by pcp   Right ureteral stone    RLS (restless legs syndrome)     Past Surgical History:  Procedure Laterality Date   CARDIOVASCULAR STRESS TEST  09/02/2004   normal perfusion study/  normal LV function and wall motion, ef 64%   CARPAL TUNNEL RELEASE Right    CERVICAL FUSION  1992   CHOLECYSTECTOMY  1995   COLONOSCOPY     CYSTO/  BOTOX INJECTION  12-27-2008  &  04-01-2006    CYSTO/  HYDRODISTENTION/  INSTILLATION THERAPY  03/04/2006   CYSTOSCOPY W/ URETERAL STENT PLACEMENT Right 04/02/2016   Procedure: CYSTOSCOPY WITH RIGHT RETROGRADE, URETEROSCOPY PYELOGRAM LASER [place];  Surgeon: Kathie Rhodes, MD;  Location: [facility];  Service: Urology;  Laterality: Right;   HEMORRHOID SURGERY  01/03/2004   HOLMIUM LASER APPLICATION Right 67/54/4920   Procedure: HOLMIUM LASER APPLICATION;  Surgeon: Kathie Rhodes, MD;  [facility];  Service: Urology;  Laterality: Right;   LAPAROSCOPIC RIGHT HEMI COLECTOMY N/A 06/12/2021   Procedure: LAPAROSCOPIC RIGHT HEMI COLECTOMY;  Surgeon: Ileana Roup, MD;  Location: [facility];  Service: General;  Laterality: N/A;   LUMBAR [place]   L4 --S1   ROTATOR CUFF REPAIR Left 2004   SHOULDER ARTHROSCOPY WITH BICEPS TENDON REPAIR Left 12/2015   SPINAL CORD STIMULATOR IMPLANT  x4  last one 2013   STONE EXTRACTION WITH BASKET Right 04/02/2016   Procedure: STONE EXTRACTION WITH BASKET;  Surgeon: Kathie Rhodes, MD;  Location: [facility];  Service: Urology;  Laterality: Right;   TONSILLECTOMY  1964   TRANSTHORACIC ECHOCARDIOGRAM  04/17/2014   EF 60-65%/  trivial AR, MR, and TR   UPPER GASTROINTESTINAL ENDOSCOPY     VAGINAL HYSTERECTOMY  1984   WRIST GANGLION EXCISION Right 02/05/2004    There were no vitals filed for this visit.    Subjective Assessment - 09/24/21 1404     Subjective Pt is having diarrhea and not completely formed stool.  Now having leakage at night and it is like water and that is happening about 4x/week.  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  Pt has leakage during the day also and not sure when it happens and just feels the wetness. Upper Rt abdomen still hurts some and Rt groin has felt sore ever since the surgery.  Diarrhea will cause upper abdominal soreness.     Patient Stated Goals not have the leakage    Currently in Pain? No/denies                [facility] PT Assessment - 09/25/21 0001       Assessment   Medical Diagnosis Z90.49 (ICD-10-CM) - Acquired absence of other specified parts of digestive tract    Referring Provider (PT) Ileana Roup, MD    Onset Date/Surgical Date 06/12/21      Precautions   Precautions None      Balance Screen   Has the patient fallen in the past 6 months No      [place] residence    Living Arrangements Spouse/significant other      Prior Function   Level of Independence Independent    Vocation Part time employment      Cognition   Overall Cognitive Status Within Functional Limits for tasks assessed      Functional Tests   Functional tests Single leg stance      Single Leg Stance   Comments trendelenburg      Posture/Postural Control   Posture/Postural Control Postural limitations    Postural Limitations Rounded Shoulders;Anterior pelvic tilt      ROM / Strength   AROM / PROM / Strength Strength;PROM;AROM      AROM   Overall AROM Comments lumbar 60% throughout      PROM   Overall PROM Comments hip Rt IR and flexion 50% +pain; Lt WFL      Strength   Overall Strength Comments Rt abdcution 4/5      Flexibility   Soft Tissue Assessment /Muscle Length yes    Hamstrings 80%      Palpation   Palpation comment tight gluteals and lumbar extensors; TTP and restriction around incisions; bloated and TTP ascending colon      Ambulation/Gait   Gait Pattern Within Functional Limits                         Objective measurements completed on examination: See above findings.     Pelvic Floor Special Questions - 09/25/21 0001     Prior Pregnancies No    Currently Sexually Active Yes    Marinoff Scale pain prevents any attempts at intercourse   due to hip pain in Rt side   Urinary Leakage No    Urinary frequency normal    Fecal  incontinence Yes   currently has BM 2-6x/day   Skin Integrity Erthema   around anus  Exam Type Rectal    Sensation TTP levators and anal sphincters Lt>Rt    Palpation not relaxing all the way    Strength weak squeeze, no lift    Strength # of reps 1    Strength # of seconds 10    Tone high             No emotional/communication barriers or cognitive limitation. Patient is motivated to learn. Patient understands and agrees with treatment goals and plan. PT explains patient will be examined in standing, sitting, and lying down to see how their muscles and joints work. When they are ready, they will be asked to remove their underwear so PT can examine their perineum. The patient is also given the option of providing their own chaperone as one is not provided in our facility. The patient also has the right and is explained the right to defer or refuse any part of the evaluation or treatment including the internal exam. With the patient's consent, PT will use one gloved finger to gently assess the muscles of the pelvic floor, seeing how well it contracts and relaxes and if there is muscle symmetry. After, the patient will get dressed and PT and patient will discuss exam findings and plan of care. PT and patient discuss plan of care, schedule, attendance policy and HEP activities.                 PT Long Term Goals - 09/24/21 1412       PT LONG TERM GOAL #1   Title Pt will feel she can go out without fear of diarrhea and leakage    Time 12    Period Weeks    Status New    Target Date 12/17/21      PT LONG TERM GOAL #2   Title Pt will be able to get through the week without fecal leakage    Baseline 4x/week    Time 12    Period Weeks    Status New    Target Date 12/17/21      PT LONG TERM GOAL #3   Title Pt will be ind with advanced HEP    Time 12    Period Weeks    Status New    Target Date 12/17/21                    Plan - 09/24/21 1445     Clinical  Impression Statement Pt presents to clinic due to fecal incontinence s/p removal of mass in large intestine and partial omentectomy. Surgery on 06/12/21 was a R hemicolectomy, partial omentectomy.  Pt has difficutly fully contracting and relaxing pelvic floor.  Pt is staying tigh throughout the anal sphincter muscles.  There is a lot of tenderness in abdomen at incision sites and lower Rt groin.  Pt has decreased IR Rt hip.  She has had back surgeries and implant of device in lumbar region and has a lot of stiffness and decreased ROM in lumbar region.  Pt will benefit from skilled PT to address the impairments listed above and reduce leakage.    Personal Factors and Comorbidities Comorbidity 3+    Comorbidities 06/12/21: R hemicolectomy, partial omentectomy, other hx: anterior fusion lumbar, cervical repair, hysterectomy,galbladder removal    Examination-Activity Limitations Toileting;Continence    Stability/Clinical Decision Making Evolving/Moderate complexity    Clinical Decision Making Moderate    Rehab Potential Excellent    PT Frequency 1x / week  PT Duration 12 weeks    PT Treatment/Interventions ADLs/Self Care Home Management;Biofeedback;Cryotherapy;Electrical Stimulation;Moist Heat;Therapeutic activities;Therapeutic exercise;Neuromuscular re-education;Patient/family education;Taping;Passive range of motion;Dry needling;Manual techniques    PT Next Visit Plan abdominal fascial release and work on relax and bulge pelvic floor, ask if she has toileting techniques and review if needed    Consulted and Agree with Plan of Care Patient             Patient will benefit from skilled therapeutic intervention in order to improve the following deficits and impairments:  Pain, Impaired sensation, Increased fascial restricitons, Decreased strength, Decreased endurance, Increased muscle spasms, Decreased coordination  Visit Diagnosis: Muscle weakness (generalized)  Unspecified lack of  coordination     Problem List Patient Active Problem List   Diagnosis Date Noted   S/P right hemicolectomy 06/12/2021   Aortic atherosclerosis ([place]) 03/31/2021   COPD (chronic obstructive pulmonary disease) ([place]) 11/24/2020   History of total hysterectomy 07/17/2019   Hypothyroidism 07/17/2019   Vertigo 11/06/2016   Dyspnea 11/02/2014   Upper airway cough syndrome 11/02/2014   Gout 06/28/2014   Vitamin B12 deficiency 06/28/2014   Multiple pulmonary nodules 06/28/2014   Essential hypertension 05/17/2014   Obesity (BMI 30-39.9) 01/14/2014   Lateral epicondylitis (tennis elbow) 01/02/2014   GERD (gastroesophageal reflux disease) 03/26/2013   LEG CRAMPS 05/16/2009   RESTLESS LEG SYNDROME, SEVERE 03/14/2009   Chronic interstitial cystitis 01/20/2009   Depression, major, recurrent ([place]) 03/19/2008   TUBULOVILLOUS ADENOMA, COLON 02/17/2008   Hyperlipidemia 02/17/2008   NEPHROLITHIASIS 02/17/2008   Former smoker 12/21/2007   MIGRAINE, CLASSICAL W/O INTRACTABLE MIGRAINE 06/09/2007   SYMPTOM, MEMORY LOSS 06/09/2007   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

## 2021-09-25 NOTE — Progress Notes (Signed)
Phone [phone] In person visit   Subjective:   Alison Brooks is a 65 y.o. year old very pleasant female patient who presents for/with See problem oriented charting Chief Complaint  Patient presents with   Follow-up    Pt had recent appendix, right colon, lymph nodes removed. Now experiencing bowel leakage.  Pt wanting flu and pneumonia vaccines today   COPD   Gastroesophageal Reflux   Groin Pain    Pt has been experiencing right groin pain, it is affecting sleep. Her RLE has became weakened as well. Exercise has seem to make that worse.    This visit occurred during the SARS-CoV-2 public health emergency.  Safety protocols were in place, including screening questions prior to the visit, additional usage of staff PPE, and extensive cleaning of exam room while observing appropriate contact time as indicated for disinfecting solutions.   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 04/21/2007    Priority: High   Aortic atherosclerosis ([place]) 03/31/2021    Priority: Medium    Gout 06/28/2014    Priority: Medium    Vitamin B12 deficiency 06/28/2014    Priority: Medium    Multiple pulmonary nodules 06/28/2014    Priority: Medium    Essential hypertension 05/17/2014    Priority: Medium    RESTLESS LEG SYNDROME, SEVERE 03/14/2009    Priority: Medium    Chronic interstitial cystitis 01/20/2009    Priority: Medium    Depression, major, recurrent ([place]) 03/19/2008    Priority: Medium    Hyperlipidemia 02/17/2008    Priority: Medium    Former smoker 12/21/2007    Priority: Medium    FIBROMYALGIA 04/21/2007    Priority: Medium    Obesity (BMI 30-39.9) 01/14/2014    Priority: Low   Lateral epicondylitis (tennis elbow) 01/02/2014    Priority: Low   GERD (gastroesophageal reflux disease) 03/26/2013     Priority: Low   LEG CRAMPS 05/16/2009    Priority: Low   TUBULOVILLOUS ADENOMA, COLON 02/17/2008    Priority: Low   NEPHROLITHIASIS 02/17/2008    Priority: Low   MIGRAINE, CLASSICAL W/O INTRACTABLE MIGRAINE 06/09/2007    Priority: Low   SYMPTOM, MEMORY LOSS 06/09/2007    Priority: Low   S/P right hemicolectomy 06/12/2021   History of total hysterectomy 07/17/2019   Hypothyroidism 07/17/2019   Vertigo 11/06/2016   Dyspnea 11/02/2014   Upper airway cough syndrome 11/02/2014    Medications- reviewed and updated Current Outpatient Medications  Medication Sig Dispense Refill   albuterol (VENTOLIN HFA) 108 (90 Base) MCG/ACT inhaler INHALE 2 PUFFS EVERY 6 HOURS AS NEEDED. 8.5 g 0   allopurinol (ZYLOPRIM) 100 MG tablet TAKE 1 TABLET BY MOUTH  DAILY 90 tablet 3   amLODipine (NORVASC) 2.5 MG tablet TAKE 1 TABLET BY MOUTH  DAILY 90 tablet 3   aspirin EC 81 MG tablet Take 81 mg by mouth daily. Swallow whole.     baclofen (LIORESAL) 10 MG tablet TAKE 1/2 TO 1 TABLET BY  MOUTH 3 TIMES DAILY 180 tablet 0   cyanocobalamin (,VITAMIN B-12,) 1000 MCG/ML injection 1000 mcg (1 mg) injection once per per month or as directed 30 mL 1   diclofenac sodium (VOLTAREN) 1 % GEL Apply 4 g topically 4 (four) times daily. 100 g 1   ELDERBERRY PO Take  by mouth.     escitalopram (LEXAPRO) 20 MG tablet TAKE 1 TABLET BY MOUTH  DAILY 90 tablet 3   ezetimibe (ZETIA) 10 MG tablet TAKE 1 TABLET BY MOUTH  DAILY 90 tablet 3   famotidine (PEPCID) 20 MG tablet Take 1 tablet (20 mg total) by mouth at bedtime. 90 tablet 2   levothyroxine (SYNTHROID, LEVOTHROID) 50 MCG tablet Take 50 mcg by mouth daily before breakfast.     oxybutynin (DITROPAN) 5 MG tablet TAKE 1 TABLET BY MOUTH 2  TIMES DAILY AS NEEDED FOR  BLADDER SPASMS. (Patient taking differently: TAKE 1 TABLET BY MOUTH 2  TIMES DAILY AS NEEDED FOR  BLADDER SPASMS.) 180 tablet 3   pantoprazole (PROTONIX) 40 MG tablet TAKE 1 TABLET BY MOUTH  TWICE DAILY 30 TO 60   MINUTES BEFORE MEALS (Patient taking differently: TAKE 1 TABLET BY MOUTH  TWICE DAILY 30 TO 60  MINUTES BEFORE MEALS) 180 tablet 3   rosuvastatin (CRESTOR) 40 MG tablet TAKE 1 TABLET BY MOUTH  DAILY 90 tablet 3   Tiotropium Bromide Monohydrate (SPIRIVA RESPIMAT) 2.5 MCG/ACT AERS Inhale 2 puffs into the lungs daily. 4 g 5   topiramate (TOPAMAX) 200 MG tablet TAKE 1 TABLET BY MOUTH  DAILY 90 tablet 3   No current facility-administered medications for this visit.     Objective:  BP 120/80 (BP Location: Right Arm) Comment: last home readings.  Pulse 68   Temp (!) 97.5 F (36.4 C) (Temporal)   Ht 5\' 2"  (1.575 m)   Wt 152 lb 12.8 oz (69.3 kg)   SpO2 100%   BMI 27.95 kg/m  Gen: NAD, resting comfortably CV: RRR no murmurs rubs or gallops Lungs: CTAB no crackles, wheeze, rhonchi Ext: no edema Skin: warm, dry Neuro: grossly normal, moves all extremities MSK: IR and ER with pain on right leg but not on left. Flexion equal both sides without pain.   No obvious hernia noted    Assessment and Plan   #Status post right hemicolectomy and partial omentectomy with [facility] Dr. Dema Severin on 06/12/21- tubular adenoma 4 cm in right distal colon- no dysplasia at margin- 2 separate tubular adenomas without high grade dysplasia and benign omentum - ongoing loose stools since surgery unfortunately- apparently not uncommon per surgeon -working with pelvic floor therapists for some incontinence after surgery - also having some right groin pain - comes and goes at first- has worsened slightly- found some exercises online that help sometimes and other times hurt- doing oce or heat mainly. Started back at work 2 weeks ago and feeling intense pain so bad can make her fall. Has had 4 falls but able to catch herself most of the time. Has considered cane and advised her to stop. Hearing a popping sensation.  - will refer to orthopedics (no obvious hernia on exam)   # COPD S: Noted on chest x-ray 2016. Patient is used  albuterol as needed for wheezing or shortness of breath. In 2022 after COVID ongoing cough/shortness of breath and we opted to try Spiriva- is helpful- less cough with this but still has some.  -had lung cancer screening 03/10/21 A/P: overall stable- continue to monitor  #Hypertension S: Compliant with amlodipine 2.5 mg daily Home readings #s: 120s/80s last reading 120/80 BP Readings from Last 3 Encounters:  10/02/21 120/80  06/15/21 (!) 120/57  06/03/21 110/60  A/P: Controlled on home readings- white coat element. Continue current medications.   # Hyperlipidemia S: compliant with rosuvastatin 40mg  daily- changed to  this on 12/05/2018 given elevated LDL. Prefers to take aspirin Lab Results  Component Value Date   CHOL 130 03/31/2021   HDL 50.70 03/31/2021   LDLCALC 64 03/31/2021   LDLDIRECT 170.0 07/17/2019   TRIG 78.0 03/31/2021   CHOLHDL 3 03/31/2021   A/P: Controlled. Continue current medications. At ideal goal LDL under 70  #Gout S: Compliant with allopurinol 100 mg. No gout flares recently- with uric acid under 6 on last check Lab Results  Component Value Date   LABURIC 3.1 03/31/2021   A/P: no recent flares- continue current meds  #Depression S: compliant with lexapro 20mg  daily.  No anhedonia or depressed mood reported today A/P:  full remission even despite 2 recent losses in the family- continue current meds.   #vitamin b12 deficiency S: compliant with b12 1039mcg injections monthly Lab Results  Component Value Date   VITAMINB12 266 03/31/2021  A/P: Has been controlled but low normal -continue monthly injections   #overactive bladder S: OAB using oxybutynin sparingly- helpful when used.  Warned of potential confusion or bladder retention risk- shes tolerated plus using sparingly  A/P: overall stable with sparing use- continue current meds  #Gerd/history of esophageal stricture S: doing well on protonix 40mg  twice daily plus pepcid per Dr. Henrene Pastor listed- but only  taking once a day protonix plus pepcid A/P: doing well with just once a day protonix- continue current meds  # Hyperglycemia/insulin resistance/prediabetes S:  Medication: none Exercise and diet- has been limited by her hip. Still has lost some weight by improving diet Lab Results  Component Value Date   HGBA1C 5.7 (H) 06/02/2021    A/P: some home sugars even fasting abov e125- she will keep a log and we will check a1c next visit (too soon for medicare recheck now)   #prior possible hematuria but had UTI- will check urine microscopic today  -patient states related to interstitial cystitis-prior urine microscopic tests have been negative for blood as recently as 2019    Health Maintenance Due  Topic Date Due   Zoster Vaccines- Shingrix (1 of 2)- advised at pharmacy Never done   MAMMOGRAM - plans [facility] 07/27/2018   DEXA SCAN - ordered with [facility] previously told osteoporosis in her 5s- will update now Never done   COVID-19 Vaccine (4 - Booster for Moderna series)- plans to do bivalent 10/23/2020   Recommended follow up: Return in about 6 months (around 04/02/2022) for physical or sooner if needed. Future Appointments  Date Time Provider [place]  10/28/2021  2:45 PM Desenglau, Tommy Rainwater, PT [facility] None  11/04/2021  2:45 PM Desenglau, Tommy Rainwater, PT [facility] None  11/11/2021  2:45 PM Desenglau, Tommy Rainwater, PT [facility] None  11/18/2021  2:45 PM Desenglau, Tommy Rainwater, PT [facility] None  11/25/2021  2:45 PM Desenglau, Tommy Rainwater, PT [facility] None  12/02/2021  2:45 PM Desenglau, Tommy Rainwater, PT [facility] None  06/28/2022 10:15 AM [facility] HEALTH COACH [facility]    Lab/Order associations:   ICD-10-CM   1. Hyperlipidemia, unspecified hyperlipidemia type  E78.5     2. Chronic obstructive pulmonary disease with acute exacerbation (HCC)  J44.1     3. Idiopathic gout of left foot, unspecified chronicity  M10.072     4. Recurrent major depressive disorder,  in full remission ([place])  F33.42     5. Gastroesophageal reflux disease without esophagitis  K21.9     6. Essential hypertension  I10     7. Need for immunization against influenza  Z23 Flu Vaccine QUAD High Dose(Fluad)    8. Immunization due  Z23 Pneumococcal conjugate vaccine 20-valent (Prevnar 20)    9. Right groin pain  R10.31 Ambulatory referral to Orthopedic Surgery    10. Osteoporosis, unspecified osteoporosis type, unspecified pathological fracture presence  M81.0 DG Bone Density     No orders of the defined types were placed in this encounter.  I,Jada Bradford,acting as a scribe for Garret Reddish, MD.,have documented all relevant documentation on the behalf of Garret Reddish, MD,as directed by  Garret Reddish, MD while in the presence of Garret Reddish, MD.  I, Garret Reddish, MD, have reviewed all documentation for this visit. The documentation on 10/02/21 for the exam, diagnosis, procedures, and orders are all accurate and complete.  Return precautions advised.  Garret Reddish, MD

## 2021-09-30 ENCOUNTER — Encounter: Payer: Medicare Other | Admitting: Physical Therapy

## 2021-10-02 ENCOUNTER — Encounter: Payer: Self-pay | Admitting: Family Medicine

## 2021-10-02 ENCOUNTER — Other Ambulatory Visit: Payer: Self-pay

## 2021-10-02 ENCOUNTER — Ambulatory Visit (INDEPENDENT_AMBULATORY_CARE_PROVIDER_SITE_OTHER): Payer: Medicare Other | Admitting: Family Medicine

## 2021-10-02 VITALS — BP 120/80 | HR 68 | Temp 97.5°F | Ht 62.0 in | Wt 152.8 lb

## 2021-10-02 DIAGNOSIS — R319 Hematuria, unspecified: Secondary | ICD-10-CM | POA: Diagnosis not present

## 2021-10-02 DIAGNOSIS — E785 Hyperlipidemia, unspecified: Secondary | ICD-10-CM | POA: Diagnosis not present

## 2021-10-02 DIAGNOSIS — Z23 Encounter for immunization: Secondary | ICD-10-CM

## 2021-10-02 DIAGNOSIS — K219 Gastro-esophageal reflux disease without esophagitis: Secondary | ICD-10-CM

## 2021-10-02 DIAGNOSIS — R1031 Right lower quadrant pain: Secondary | ICD-10-CM

## 2021-10-02 DIAGNOSIS — F3342 Major depressive disorder, recurrent, in full remission: Secondary | ICD-10-CM | POA: Diagnosis not present

## 2021-10-02 DIAGNOSIS — M10072 Idiopathic gout, left ankle and foot: Secondary | ICD-10-CM | POA: Diagnosis not present

## 2021-10-02 DIAGNOSIS — I10 Essential (primary) hypertension: Secondary | ICD-10-CM | POA: Diagnosis not present

## 2021-10-02 DIAGNOSIS — M810 Age-related osteoporosis without current pathological fracture: Secondary | ICD-10-CM | POA: Diagnosis not present

## 2021-10-02 DIAGNOSIS — J441 Chronic obstructive pulmonary disease with (acute) exacerbation: Secondary | ICD-10-CM | POA: Diagnosis not present

## 2021-10-02 LAB — URINALYSIS, MICROSCOPIC ONLY

## 2021-10-02 NOTE — Patient Instructions (Addendum)
Health Maintenance Due  Topic Date Due   Zoster Vaccines- Shingrix (1 of 2)- Please check with your pharmacy to see if they have the shingrix vaccine. If they do- please get this immunization and update [place] by phone call or mychart with dates you receive the vaccine. Never done   MAMMOGRAM -  Please schedule your mammogram by [facility]  07/27/2018   DEXA SCAN -  Please schedule your bone density test at check out desk.  - located [address] across the [address] from [place] - in the basement - you DO NEED an appointment for the bone density tests.   Never done   Pneumonia Vaccine 14+ Years old (2 - PCV)-  -consider at future visit  07/16/2020   COVID-19 Vaccine (4 - Booster for Moderna series)- -Please consider getting bivalent booster shot at your local pharmacy. If received, please let us know.   10/23/2020   INFLUENZA VACCINE - High dose flu shot done today in office.  05/25/2021   Team please take her to the lab and make sure they release urine Microscopic from June visit-reorder under hematuria if needed  Team she needs a full PHQ-9-please let me know if any suicidal thoughts noted injections  We will call you within two weeks about your referral to Sports Medicine. If you do not hear within 2 weeks, give [place] a call.   Recommended follow up: Return in about 6 months (around 04/02/2022) for physical or sooner if needed.  Happy Holidays!

## 2021-10-05 ENCOUNTER — Other Ambulatory Visit: Payer: Self-pay

## 2021-10-05 DIAGNOSIS — R319 Hematuria, unspecified: Secondary | ICD-10-CM

## 2021-10-12 ENCOUNTER — Other Ambulatory Visit: Payer: Medicare Other

## 2021-10-12 ENCOUNTER — Other Ambulatory Visit: Payer: Self-pay

## 2021-10-12 DIAGNOSIS — R319 Hematuria, unspecified: Secondary | ICD-10-CM | POA: Diagnosis not present

## 2021-10-13 ENCOUNTER — Telehealth: Payer: Self-pay

## 2021-10-13 LAB — URINE CULTURE
MICRO NUMBER:: 12774295
SPECIMEN QUALITY:: ADEQUATE

## 2021-10-13 NOTE — Telephone Encounter (Signed)
Please advise message below no open appointment until Thursday.

## 2021-10-13 NOTE — Telephone Encounter (Signed)
Wife tested positive for COVID. He would like to know if she can get anything called in. She has fever 103, chills, cough. Advil not working to reduce fever.

## 2021-10-13 NOTE — Telephone Encounter (Signed)
I can work her in 1140 on Wednesday if she has virtual visit capacity (I may be running slightly late but this should not be a problem for a virtual visit) and cancel visit on 12/21 with Dr. Dellia Cloud

## 2021-10-14 ENCOUNTER — Telehealth (INDEPENDENT_AMBULATORY_CARE_PROVIDER_SITE_OTHER): Payer: Medicare Other | Admitting: Family Medicine

## 2021-10-14 ENCOUNTER — Encounter: Payer: Self-pay | Admitting: Family Medicine

## 2021-10-14 ENCOUNTER — Other Ambulatory Visit: Payer: Self-pay

## 2021-10-14 VITALS — BP 180/106 | HR 87 | Temp 101.5°F | Ht 62.0 in | Wt 152.8 lb

## 2021-10-14 DIAGNOSIS — U071 COVID-19: Secondary | ICD-10-CM | POA: Diagnosis not present

## 2021-10-14 MED ORDER — MOLNUPIRAVIR EUA 200MG CAPSULE: 4.0000 | ORAL_CAPSULE | Freq: Two times a day (BID) | ORAL | 0 refills | Status: AC

## 2021-10-14 MED ORDER — BENZONATATE 100 MG PO CAPS: 100.0000 mg | ORAL_CAPSULE | Freq: Three times a day (TID) | ORAL | 0 refills | Status: DC | PRN

## 2021-10-14 NOTE — Patient Instructions (Addendum)
Advised of CDC guidelines for self isolation/ ending isolation.  Advised of safe practice guidelines. Symptom Tier reviewed.  Encouraged to monitor for any worsening symptoms; watch for increased shortness of breath, weakness, and signs of dehydration. Advised when to seek emergency care.  Instructed to rest and hydrate well.  Advised to leave the house during recommended isolation period, only if it is necessary to seek medical care   Meds have been sent the the pharmacy You can take tylenol for pain/fevers If worsening symptoms, let us know or go to the Emergency room   Drink plenty of fluids

## 2021-10-14 NOTE — Progress Notes (Signed)
Virtual telephone visit    Virtual Visit via Telephone Note   This visit type was conducted due to national recommendations for restrictions regarding the COVID-19 Pandemic (e.g. social distancing) in an effort to limit this patient's exposure and mitigate transmission in our community. Due to her co-morbid illnesses, this patient is at least at moderate risk for complications without adequate follow up. This format is felt to be most appropriate for this patient at this time. The patient did not have access to video technology or had technical difficulties with video requiring transitioning to audio format only (telephone). Physical exam was limited to content and character of the telephone converstion. i was able to get the patient set up on a telephone visit.   Patient location: home Patient and provider in visit Provider location: Office  I discussed the limitations of evaluation and management by telemedicine and the availability of in person appointments. The patient expressed understanding and agreed to proceed.   Visit Date: 10/14/2021  Today's healthcare provider: Wellington Hampshire., MD     Subjective:    Patient ID: Alison Brooks, female    DOB: [birth year], 65 y.o.   MRN: [id number]  Chief Complaint  Patient presents with   Cough    Non productive cough Sx started Sunday   Nasal Congestion   Generalized Body Aches   Chills   leg cramps   Fever    Taking tylenol and ibuprofen    Headache   Diarrhea   Emesis    HPI[place] to video [place]'t work well Patient is in today for cough/congestion, myalgias, chills,ha,V/D since 12/19 On 12/18-cramps in legs and did covid test and neg.  And then + on 12/19.   Worst is HA.  Fever to 103, but down to 100 now.   Taking advil/cough syrup. Occ SOB.  Using inhalers.  Pulse ox 97%  Didn't get bivalent covid booster.  This is second time for covid   Past Medical History:  Diagnosis Date    Anxiety    Arthritis    back, neck   Chronic back pain    COPD (chronic obstructive pulmonary disease) (HCC)    Depression    Dyspnea    Since covid , NOW ON INHALERS   Fatty liver    Fibromyalgia    GERD (gastroesophageal reflux disease)    Heart murmur    Hematuria    History of adenomatous polyp of colon    History of COVID-19 10/2020   History of kidney stones    History of seizure    x1 2007 post op (per pt neurologist work-up done ?mixture of anesthesia medication and prozac that pt had taken)  and has not any issues since    History of squamous cell carcinoma excision    Hyperlipidemia    Hypertension    Hypothyroidism    IC (interstitial cystitis)    Long COVID    Migraine    Osteoporosis    Pneumonia    history of   Pulmonary nodules    multiple per ct -- monitored by pcp   Right ureteral stone    RLS (restless legs syndrome)     Past Surgical History:  Procedure Laterality Date   CARDIOVASCULAR STRESS TEST  09/02/2004   normal perfusion study/  normal LV function and wall motion, ef 64%   CARPAL TUNNEL RELEASE Right    [place]  COLONOSCOPY     CYSTO/  BOTOX INJECTION  12-27-2008  &  04-01-2006   CYSTO/  HYDRODISTENTION/  INSTILLATION THERAPY  03/04/2006   CYSTOSCOPY W/ URETERAL STENT PLACEMENT Right 04/02/2016   Procedure: CYSTOSCOPY WITH RIGHT RETROGRADE, URETEROSCOPY PYELOGRAM LASER LITHOTRIPSY,/URETERAL STENT PLACEMENT;  Surgeon: Kathie Rhodes, MD;  Location: [facility];  Service: Urology;  Laterality: Right;   HEMORRHOID SURGERY  01/03/2004   HOLMIUM LASER APPLICATION Right 13/24/4010   Procedure: HOLMIUM LASER APPLICATION;  Surgeon: Kathie Rhodes, MD;  Location: [facility];  Service: Urology;  Laterality: Right;   LAPAROSCOPIC RIGHT HEMI COLECTOMY N/A 06/12/2021   Procedure: LAPAROSCOPIC RIGHT HEMI COLECTOMY;  Surgeon: Ileana Roup, MD;  Location[facility];  Service: General;   Laterality: N/A;   LUMBAR [place]   L4 --S1   ROTATOR CUFF REPAIR Left 2004   SHOULDER ARTHROSCOPY WITH BICEPS TENDON REPAIR Left 12/2015   SPINAL CORD STIMULATOR IMPLANT  x4  last one 2013   STONE EXTRACTION WITH BASKET Right 04/02/2016   Procedure: STONE EXTRACTION WITH BASKET;  Surgeon: Kathie Rhodes, MD;  Location: [facility];  Service: Urology;  Laterality: Right;   TONSILLECTOMY  1964   TRANSTHORACIC ECHOCARDIOGRAM  04/17/2014   EF 60-65%/  trivial AR, MR, and TR   UPPER GASTROINTESTINAL ENDOSCOPY     VAGINAL HYSTERECTOMY  1984   WRIST GANGLION EXCISION Right 02/05/2004    Family History  Problem Relation Age of Onset   Heart attack Mother        Mom 60, Dad 54   Hypertension Mother    Heart disease Mother    Heart disease Father    Diabetes Father    Colon cancer Maternal Grandmother        not sure age of onset   Esophageal cancer Neg Hx    Rectal cancer Neg Hx    Stomach cancer Neg Hx     Social History   Socioeconomic History   Marital status: Married    Spouse name: Antony Haste   Number of children: 1   Years of education: College   Highest education level: Not on file  Occupational History   Occupation: Retired Marine scientist  Tobacco Use   Smoking status: Former    Packs/day: 1.25    Years: 40.00    Pack years: 50.00    Types: Cigarettes    Quit date: 06/07/2008    Years since quitting: 13.3   Smokeless tobacco: Never  Vaping Use   Vaping Use: Never used  Substance and Sexual Activity   Alcohol use: Yes    Alcohol/week: 0.0 standard drinks    Comment: VERY RARE   Drug use: No   Sexual activity: Not on file  Other Topics Concern   Not on file  Social History Narrative   Lives with spouse, Antony Haste.    Caffeine use: 1-2 cups coffee per day   Married 47 years in December 2020. 1 daughter. 2 grandkids (boy and girl). Lives 10 miles away.       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       Hobbies: previously  enjoyed dancing, old car shoes (51 chevy), reading   Social Determinants of Radio broadcast assistant Strain: Low Risk    Difficulty of Paying Living Expenses: Not hard at all  Food Insecurity: No Food Insecurity   Worried About Charity fundraiser in the Last Year: Never  true   Ran Out of Food in the Last Year: Never true  Transportation Needs: No Transportation Needs   Lack of Transportation (Medical): No   Lack of Transportation (Non-Medical): No  Physical Activity: Inactive   Days of Exercise per Week: 0 days   Minutes of Exercise per Session: 0 min  Stress: No Stress Concern Present   Feeling of Stress : Not at all  Social Connections: Moderately Isolated   Frequency of Communication with Friends and Family: More than three times a week   Frequency of Social Gatherings with Friends and Family: More than three times a week   Attends Religious Services: Never   Marine scientist or Organizations: No   Attends Music therapist: Never   Marital Status: Married  Human resources officer Violence: Not At Risk   Fear of Current or Ex-Partner: No   Emotionally Abused: No   Physically Abused: No   Sexually Abused: No    Outpatient Medications Prior to Visit  Medication Sig Dispense Refill   albuterol (VENTOLIN HFA) 108 (90 Base) MCG/ACT inhaler INHALE 2 PUFFS EVERY 6 HOURS AS NEEDED. 8.5 g 0   allopurinol (ZYLOPRIM) 100 MG tablet TAKE 1 TABLET BY MOUTH  DAILY 90 tablet 3   amLODipine (NORVASC) 2.5 MG tablet TAKE 1 TABLET BY MOUTH  DAILY 90 tablet 3   aspirin EC 81 MG tablet Take 81 mg by mouth daily. Swallow whole.     baclofen (LIORESAL) 10 MG tablet TAKE 1/2 TO 1 TABLET BY  MOUTH 3 TIMES DAILY 180 tablet 0   cyanocobalamin (,VITAMIN B-12,) 1000 MCG/ML injection 1000 mcg (1 mg) injection once per per month or as directed 30 mL 1   diclofenac sodium (VOLTAREN) 1 % GEL Apply 4 g topically 4 (four) times daily. 100 g 1   ELDERBERRY PO Take by mouth.     escitalopram  (LEXAPRO) 20 MG tablet TAKE 1 TABLET BY MOUTH  DAILY 90 tablet 3   ezetimibe (ZETIA) 10 MG tablet TAKE 1 TABLET BY MOUTH  DAILY 90 tablet 3   famotidine (PEPCID) 20 MG tablet Take 1 tablet (20 mg total) by mouth at bedtime. 90 tablet 2   levothyroxine (SYNTHROID, LEVOTHROID) 50 MCG tablet Take 50 mcg by mouth daily before breakfast.     oxybutynin (DITROPAN) 5 MG tablet TAKE 1 TABLET BY MOUTH 2  TIMES DAILY AS NEEDED FOR  BLADDER SPASMS. (Patient taking differently: TAKE 1 TABLET BY MOUTH 2  TIMES DAILY AS NEEDED FOR  BLADDER SPASMS.) 180 tablet 3   pantoprazole (PROTONIX) 40 MG tablet TAKE 1 TABLET BY MOUTH  TWICE DAILY 30 TO 60  MINUTES BEFORE MEALS (Patient taking differently: TAKE 1 TABLET BY MOUTH  TWICE DAILY 30 TO 60  MINUTES BEFORE MEALS) 180 tablet 3   promethazine (PHENERGAN) 12.5 MG tablet Take 12.5 mg by mouth every 6 (six) hours as needed.     rosuvastatin (CRESTOR) 40 MG tablet TAKE 1 TABLET BY MOUTH  DAILY 90 tablet 3   Tiotropium Bromide Monohydrate (SPIRIVA RESPIMAT) 2.5 MCG/ACT AERS Inhale 2 puffs into the lungs daily. 4 g 5   topiramate (TOPAMAX) 200 MG tablet TAKE 1 TABLET BY MOUTH  DAILY 90 tablet 3   No facility-administered medications prior to visit.    Allergies  Allergen Reactions   Latex Other (See Comments)    blisters   Oxycodone Nausea And Vomiting    And sweating   Zofran [Ondansetron Hcl] Other (See Comments)  headache    HYI:FOYDX w/stool leakage/diarrhea since hemicolectomy in august.       Objective:    Physical Exam-pt congested.  Doesn't sound like any respiratory distress.   BP (!) 180/106    Pulse 87    Temp (!) 101.5 F (38.6 C) (Oral)    Ht 5\' 2"  (1.575 m)    Wt 152 lb 12.8 oz (69.3 kg)    BMI 27.95 kg/m  Wt Readings from Last 3 Encounters:  10/14/21 152 lb 12.8 oz (69.3 kg)  10/02/21 152 lb 12.8 oz (69.3 kg)  06/12/21 156 lb 15.5 oz (71.2 kg)    Lab Results  Component Value Date   WBC 9.0 06/15/2021   HGB 12.2 06/15/2021   HCT  38.5 06/15/2021   PLT 174 06/15/2021   GLUCOSE 112 (H) 06/13/2021   CHOL 130 03/31/2021   TRIG 78.0 03/31/2021   HDL 50.70 03/31/2021   LDLDIRECT 170.0 07/17/2019   LDLCALC 64 03/31/2021   ALT 35 06/02/2021   AST 27 06/02/2021   NA 137 06/13/2021   K 3.6 06/13/2021   CL 109 06/13/2021   CREATININE 0.84 06/13/2021   BUN 14 06/13/2021   CO2 22 06/13/2021   TSH 1.76 03/31/2021   INR 1.0 06/02/2021   HGBA1C 5.7 (H) 06/02/2021    Lab Results  Component Value Date   TSH 1.76 03/31/2021   Lab Results  Component Value Date   WBC 9.0 06/15/2021   HGB 12.2 06/15/2021   HCT 38.5 06/15/2021   MCV 99.0 06/15/2021   PLT 174 06/15/2021   Lab Results  Component Value Date   NA 137 06/13/2021   K 3.6 06/13/2021   CO2 22 06/13/2021   GLUCOSE 112 (H) 06/13/2021   BUN 14 06/13/2021   CREATININE 0.84 06/13/2021   BILITOT 0.6 06/02/2021   ALKPHOS 82 06/02/2021   AST 27 06/02/2021   ALT 35 06/02/2021   PROT 6.9 06/02/2021   ALBUMIN 4.2 06/02/2021   CALCIUM 8.6 (L) 06/13/2021   ANIONGAP 6 06/13/2021   GFR 69.98 03/31/2021   Lab Results  Component Value Date   CHOL 130 03/31/2021   Lab Results  Component Value Date   HDL 50.70 03/31/2021   Lab Results  Component Value Date   LDLCALC 64 03/31/2021   Lab Results  Component Value Date   TRIG 78.0 03/31/2021   Lab Results  Component Value Date   CHOLHDL 3 03/31/2021   Lab Results  Component Value Date   HGBA1C 5.7 (H) 06/02/2021       Assessment & Plan:   Problem List Items Addressed This Visit   None Visit Diagnoses     COVID-19    -  Primary   Relevant Medications   molnupiravir EUA (LAGEVRIO) 200 mg CAPS capsule      Covid 19-will tx as high risk.  Discussed going to ER if sob, sats low, getting dehydrated.  Drink plenty of fluids Elevated bp-suspect from illness/meds.  Was fine 2 wks ago.  Monitor closely  I am having Alison Brooks "[name]" start on molnupiravir EUA and benzonatate. I am also  having her maintain her oxybutynin, diclofenac sodium, levothyroxine, ELDERBERRY PO, cyanocobalamin, rosuvastatin, allopurinol, escitalopram, Spiriva Respimat, topiramate, albuterol, aspirin EC, pantoprazole, famotidine, ezetimibe, amLODipine, baclofen, and promethazine.  Meds ordered this encounter  Medications   molnupiravir EUA (LAGEVRIO) 200 mg CAPS capsule    Sig: Take 4 capsules (800 mg total) by mouth 2 (two) times daily for 5 days.  Dispense:  40 capsule    Refill:  0   benzonatate (TESSALON PERLES) 100 MG capsule    Sig: Take 1 capsule (100 mg total) by mouth 3 (three) times daily as needed for cough.    Dispense:  20 capsule    Refill:  0     I discussed the assessment and treatment plan with the patient. The patient was provided an opportunity to ask questions and all were answered. The patient agreed with the plan and demonstrated an understanding of the instructions.   The patient was advised to call back or seek an in-person evaluation if the symptoms worsen or if the condition fails to improve as anticipated.  I provided 15 minutes of non-face-to-face time during this encounter.   Wellington Hampshire., MD [place] [phone] (phone) [phone] (fax)  [place]

## 2021-10-14 NOTE — Telephone Encounter (Signed)
Patient scheduled with Esther Hardy, MD at 2 pm today.

## 2021-10-28 ENCOUNTER — Encounter: Payer: Medicare Other | Admitting: Physical Therapy

## 2021-11-04 ENCOUNTER — Encounter: Payer: Medicare Other | Admitting: Physical Therapy

## 2021-11-11 ENCOUNTER — Encounter: Payer: Self-pay | Admitting: Physical Therapy

## 2021-11-11 ENCOUNTER — Other Ambulatory Visit: Payer: Self-pay

## 2021-11-11 ENCOUNTER — Ambulatory Visit: Payer: Medicare Other | Attending: Surgery | Admitting: Physical Therapy

## 2021-11-11 DIAGNOSIS — M6281 Muscle weakness (generalized): Secondary | ICD-10-CM | POA: Diagnosis not present

## 2021-11-11 DIAGNOSIS — R279 Unspecified lack of coordination: Secondary | ICD-10-CM | POA: Insufficient documentation

## 2021-11-11 NOTE — Therapy (Addendum)
[facility] @ [address] [place], [place], [address] Phone: [phone]   Fax:  [phone]  Physical Therapy Treatment  Patient Details  Name: Alison Brooks MRN: [id number] Date of Birth: [birth year] Referring Provider (PT): Ileana Roup, MD   Encounter Date: 11/11/2021   PT End of Session - 11/11/21 1449     Visit Number 2    Date for PT Re-Evaluation 12/17/21    Authorization Type [employer] medicare    PT Start Time 1445    PT Stop Time 1523    PT Time Calculation (min) 38 min    Activity Tolerance Patient tolerated treatment well    Behavior During Therapy WFL for tasks assessed/performed             Past Medical History:  Diagnosis Date   Anxiety    Arthritis    back, neck   Chronic back pain    COPD (chronic obstructive pulmonary disease) ([place])    Depression    Dyspnea    Since covid , NOW ON INHALERS   Fatty liver    Fibromyalgia    GERD (gastroesophageal reflux disease)    Heart murmur    Hematuria    History of adenomatous polyp of colon    History of COVID-19 10/2020   History of kidney stones    History of seizure    x1 2007 post op (per pt neurologist work-up done ?mixture of anesthesia medication and prozac that pt had taken)  and has not any issues since    History of squamous cell carcinoma excision    Hyperlipidemia    Hypertension    Hypothyroidism    IC (interstitial cystitis)    Long COVID    Migraine    Osteoporosis    Pneumonia    history of   Pulmonary nodules    multiple per ct -- monitored by pcp   Right ureteral stone    RLS (restless legs syndrome)     Past Surgical History:  Procedure Laterality Date   CARDIOVASCULAR STRESS TEST  09/02/2004   normal perfusion study/  normal LV function and wall motion, ef 64%   CARPAL TUNNEL RELEASE Right    CERVICAL FUSION  1992   CHOLECYSTECTOMY  1995   COLONOSCOPY     CYSTO/  BOTOX INJECTION  12-27-2008  &  04-01-2006    CYSTO/  HYDRODISTENTION/  INSTILLATION THERAPY  03/04/2006   CYSTOSCOPY W/ URETERAL STENT PLACEMENT Right 04/02/2016   Procedure: CYSTOSCOPY WITH RIGHT RETROGRADE, URETEROSCOPY PYELOGRAM LASER [place];  Surgeon: Kathie Rhodes, MD;  Location: [facility];  Service: Urology;  Laterality: Right;   HEMORRHOID SURGERY  01/03/2004   HOLMIUM LASER APPLICATION Right 33/35/4562   Procedure: HOLMIUM LASER APPLICATION;  Surgeon: Kathie Rhodes, MD;  Location: [facility];  Service: Urology;  Laterality: Right;   LAPAROSCOPIC RIGHT HEMI COLECTOMY N/A 06/12/2021   Procedure: LAPAROSCOPIC RIGHT HEMI COLECTOMY;  Surgeon: Ileana Roup, MD;  [facility];  Service: General;  Laterality: N/A;   LUMBAR [place]   L4 --S1   ROTATOR CUFF REPAIR Left 2004   SHOULDER ARTHROSCOPY WITH BICEPS TENDON REPAIR Left 12/2015   SPINAL CORD STIMULATOR IMPLANT  x4  last one 2013   STONE EXTRACTION WITH BASKET Right 04/02/2016   Procedure: STONE EXTRACTION WITH BASKET;  Surgeon: Kathie Rhodes, MD;  Location: [facility];  Service: Urology;  Laterality: Right;   TONSILLECTOMY  1964   TRANSTHORACIC ECHOCARDIOGRAM  04/17/2014   EF 60-65%/  trivial AR, MR, and TR   UPPER GASTROINTESTINAL ENDOSCOPY     VAGINAL HYSTERECTOMY  1984   WRIST GANGLION EXCISION Right 02/05/2004    There were no vitals filed for this visit.   Subjective Assessment - 11/11/21 1556     Subjective My stool is still loose but more formed.  Still have leakage and do not know when it will happen. Rt groin is sore but not flared up.    Patient Stated Goals not have the leakage    Currently in Pain? No/denies                               [facility] Adult PT Treatment/Exercise - 11/11/21 0001       Exercises   Exercises Lumbar      Lumbar Exercises: Stretches   Single Knee to Chest Stretch 2 reps;10 seconds    Lower Trunk  Rotation 10 seconds    Lower Trunk Rotation Limitations 10 x each side    Prone on Elbows Stretch Limitations leaning over table doing cat cow    Piriformis Stretch Limitations bil 2 x 20sec figure 4 and knee to opposite shoulder    Other Lumbar Stretch Exercise thoracic rotations at the wall      Manual Therapy   Manual Therapy Myofascial release    Myofascial Release abdomen throughout large intestine                     PT Education - 11/11/21 1550     Education Details Access Code: [email or web address]  URL: [email or web address]  Date: 11/11/2021  Prepared by: Jari Favre    Exercises  Cat Cow - 1 x daily - 7 x weekly - 3 sets - 10 reps  Supine Lower Trunk Rotation - 1 x daily - 7 x weekly - 1 sets - 10 reps - 5 sec hold  Standing with Forearms Thoracic Rotation - 1 x daily - 7 x weekly - 3 sets - 10 reps  Supine Piriformis Stretch with Foot on Ground - 1 x daily - 7 x weekly - 1 sets - 3 reps - 30 sec hold  Hooklying Single Knee to Chest Stretch - 1 x daily - 7 x weekly - 1 sets - 5 reps - 20 sec hold  Diaphragmatic Breathing at 90/90 Supported - 1 x daily - 7 x weekly - 3 sets - 10 reps    Person(s) Educated Patient    Methods Explanation;Demonstration;Tactile cues;Verbal cues;Handout    Comprehension Verbalized understanding;Returned demonstration                 PT Long Term Goals - 09/24/21 1412       PT LONG TERM GOAL #1   Title Pt will feel she can go out without fear of diarrhea and leakage    Time 12    Period Weeks    Status New    Target Date 12/17/21      PT LONG TERM GOAL #2   Title Pt will be able to get through the week without fecal leakage    Baseline 4x/week    Time 12    Period Weeks    Status New    Target Date 12/17/21      PT LONG TERM GOAL #3   Title  Pt will be ind with advanced HEP    Time 12    Period Weeks    Status New    Target Date 12/17/21                   Plan - 11/11/21 1545     Clinical  Impression Statement Pt had some release around abdomen at descending colon.  She had no pain or tenderness to palpation.  Pt was educated on stretches and diaphragmatic breathing to work on bulging pelvic floor.  Pt given very easy contract and relax exercise to work on sensation of taking muscles through the full range of motion.  Pt will benefit from skilled PT to continue to progress muscle coordination for improved toileting.    PT Treatment/Interventions ADLs/Self Care Home Management;Biofeedback;Cryotherapy;Electrical Stimulation;Moist Heat;Therapeutic activities;Therapeutic exercise;Neuromuscular re-education;Patient/family education;Taping;Passive range of motion;Dry needling;Manual techniques    PT Next Visit Plan f/u on intial HEP and bulging pelvic floor in sitting; add core strength and exhale with exertions; cont MFR to abdomen if needed             Patient will benefit from skilled therapeutic intervention in order to improve the following deficits and impairments:  Pain, Impaired sensation, Increased fascial restricitons, Decreased strength, Decreased endurance, Increased muscle spasms, Decreased coordination  Visit Diagnosis: Muscle weakness (generalized)  Unspecified lack of coordination     Problem List Patient Active Problem List   Diagnosis Date Noted   S/P right hemicolectomy 06/12/2021   Aortic atherosclerosis ([place]) 03/31/2021   COPD (chronic obstructive pulmonary disease) ([place]) 11/24/2020   History of total hysterectomy 07/17/2019   Hypothyroidism 07/17/2019   Vertigo 11/06/2016   Dyspnea 11/02/2014   Upper airway cough syndrome 11/02/2014   Gout 06/28/2014   Vitamin B12 deficiency 06/28/2014   Multiple pulmonary nodules 06/28/2014   Essential hypertension 05/17/2014   Obesity (BMI 30-39.9) 01/14/2014   Lateral epicondylitis (tennis elbow) 01/02/2014   GERD (gastroesophageal reflux disease) 03/26/2013   LEG CRAMPS 05/16/2009   RESTLESS LEG SYNDROME,  SEVERE 03/14/2009   Chronic interstitial cystitis 01/20/2009   Depression, major, recurrent ([place]) 03/19/2008   TUBULOVILLOUS ADENOMA, COLON 02/17/2008   Hyperlipidemia 02/17/2008   NEPHROLITHIASIS 02/17/2008   Former smoker 12/21/2007   MIGRAINE, CLASSICAL W/O INTRACTABLE MIGRAINE 06/09/2007   SYMPTOM, MEMORY LOSS 06/09/2007   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 Patient goals were  maybe partially met but pt called and reports she is feeling better . Patient is being discharged due to being pleased with the current functional level. Per pt phone call  Gustavus Bryant, PT 11/25/21 11:15 AM

## 2021-11-11 NOTE — Patient Instructions (Addendum)
°  URL: [email or web address] Date: 11/11/2021 Prepared by: Jari Favre  Exercises Cat Cow - 1 x daily - 7 x weekly - 3 sets - 10 reps Supine Lower Trunk Rotation - 1 x daily - 7 x weekly - 1 sets - 10 reps - 5 sec hold Standing with Forearms Thoracic Rotation - 1 x daily - 7 x weekly - 3 sets - 10 reps Supine Piriformis Stretch with Foot on Ground - 1 x daily - 7 x weekly - 1 sets - 3 reps - 30 sec hold Hooklying Single Knee to Chest Stretch - 1 x daily - 7 x weekly - 1 sets - 5 reps - 20 sec hold Diaphragmatic Breathing at 90/90 Supported - 1 x daily - 7 x weekly - 3 sets - 10 reps

## 2021-11-16 DIAGNOSIS — M25551 Pain in right hip: Secondary | ICD-10-CM | POA: Diagnosis not present

## 2021-11-16 DIAGNOSIS — M545 Low back pain, unspecified: Secondary | ICD-10-CM | POA: Diagnosis not present

## 2021-11-18 ENCOUNTER — Encounter: Payer: Medicare Other | Admitting: Physical Therapy

## 2021-11-21 ENCOUNTER — Other Ambulatory Visit: Payer: Self-pay | Admitting: Family Medicine

## 2021-11-25 ENCOUNTER — Encounter: Payer: Medicare Other | Admitting: Physical Therapy

## 2021-11-26 ENCOUNTER — Other Ambulatory Visit: Payer: Self-pay | Admitting: Internal Medicine

## 2021-12-02 ENCOUNTER — Encounter: Payer: Medicare Other | Admitting: Physical Therapy

## 2021-12-07 DIAGNOSIS — M25551 Pain in right hip: Secondary | ICD-10-CM | POA: Diagnosis not present

## 2022-01-12 ENCOUNTER — Other Ambulatory Visit: Payer: Self-pay | Admitting: Family Medicine

## 2022-01-20 ENCOUNTER — Telehealth: Payer: Self-pay | Admitting: Pharmacist

## 2022-01-20 NOTE — Progress Notes (Signed)
? ? ?Chronic Care Management ?Pharmacy Assistant  ? ?Name: CATRENA VARI  MRN: [id number] DOB: [birth year] ? ? ?Reason for Encounter: General Adherence Call ?  ? ?Recent office visits:  ?10/14/2021 VV (Family Medicine) Tawnya Crook, MD; molnupiravir 200 mg BID x 5 days, Benzonatate 100 mg TID PRN cough. ? ?10/02/2021 OV (PCP) [name], MD; no medication changes indicated. ? ?Recent consult visits:  ?08/19/2021 [facility]) Tami Lin, MD; -She will take Imodium a few times per week. We discussed taking it more proactively than reactively. We discussed 1 or 2 tablets a day to start. ? ?Hospital visits:  ?None in previous 6 months ? ?Medications: ?Outpatient Encounter Medications as of 01/20/2022  ?Medication Sig  ? albuterol (VENTOLIN HFA) 108 (90 Base) MCG/ACT inhaler INHALE 2 PUFFS EVERY 6 HOURS AS NEEDED.  ? allopurinol (ZYLOPRIM) 100 MG tablet TAKE 1 TABLET BY MOUTH  DAILY  ? amLODipine (NORVASC) 2.5 MG tablet TAKE 1 TABLET BY MOUTH  DAILY  ? aspirin EC 81 MG tablet Take 81 mg by mouth daily. Swallow whole.  ? baclofen (LIORESAL) 10 MG tablet TAKE 1/2 TO 1 TABLET BY  MOUTH 3 TIMES DAILY  ? benzonatate (TESSALON PERLES) 100 MG capsule Take 1 capsule (100 mg total) by mouth 3 (three) times daily as needed for cough.  ? cyanocobalamin (,VITAMIN B-12,) 1000 MCG/ML injection 1000 mcg (1 mg) injection once per per month or as directed  ? diclofenac sodium (VOLTAREN) 1 % GEL Apply 4 g topically 4 (four) times daily.  ? ELDERBERRY PO Take by mouth.  ? escitalopram (LEXAPRO) 20 MG tablet TAKE 1 TABLET BY MOUTH  DAILY  ? ezetimibe (ZETIA) 10 MG tablet TAKE 1 TABLET BY MOUTH  DAILY  ? famotidine (PEPCID) 20 MG tablet TAKE 1 TABLET BY MOUTH AT  BEDTIME  ? levothyroxine (SYNTHROID, LEVOTHROID) 50 MCG tablet Take 50 mcg by mouth daily before breakfast.  ? oxybutynin (DITROPAN) 5 MG tablet TAKE 1 TABLET BY MOUTH 2  TIMES DAILY AS NEEDED FOR  BLADDER SPASMS. (Patient taking differently:  TAKE 1 TABLET BY MOUTH 2  TIMES DAILY AS NEEDED FOR  BLADDER SPASMS.)  ? pantoprazole (PROTONIX) 40 MG tablet TAKE 1 TABLET BY MOUTH  TWICE DAILY 30 TO 60  MINUTES BEFORE MEALS (Patient taking differently: TAKE 1 TABLET BY MOUTH  TWICE DAILY 30 TO 60  MINUTES BEFORE MEALS)  ? promethazine (PHENERGAN) 12.5 MG tablet Take 12.5 mg by mouth every 6 (six) hours as needed.  ? rosuvastatin (CRESTOR) 40 MG tablet TAKE 1 TABLET BY MOUTH  DAILY  ? Tiotropium Bromide Monohydrate (SPIRIVA RESPIMAT) 2.5 MCG/ACT AERS Inhale 2 puffs into the lungs daily.  ? topiramate (TOPAMAX) 200 MG tablet TAKE 1 TABLET BY MOUTH  DAILY  ? [DISCONTINUED] orlistat (XENICAL) 120 MG capsule Take 1 capsule (120 mg total) by mouth 3 (three) times daily with meals.  ? ?No facility-administered encounter medications on file as of 01/20/2022.  ? ?Patient Questions: ?Have you had any problems recently with your health? ?Patient states she has not had any problems recently with her health. ? ?Have you had any problems with your pharmacy? ?Patient states she has not had any problems with her pharmacy. ? ?What issues or side effects are you having with your medications? ?Patient states she has not had any issues or side effects with any of her medications. ? ?What would you like me to pass along to Leata Mouse, CPP for him to help you with?  ?  Patient states she does not have anything for me to pass along at this time. ? ?What can we do to take care of you better? ?Patient did not have any suggestions. ? ?Care Gaps: ?Medicare Annual Wellness: Completed 06/11/2021 ?Hemoglobin A1C: 5.7% on 06/02/2021 ?Colonoscopy: Next due on 04/07/2022 ?Dexa Scan: Ordered on 10/02/2021 ?Mammogram: Overdue since 07/27/2018 ? ?Future Appointments  ?Date Time Provider [place]  ?04/02/2022  1:20 PM [name], MD [facility]  ?06/28/2022 10:15 AM [facility] HEALTH COACH [facility]  ? ?Star Rating Drugs: ?Rosuvastatin 40 mg last filled 12/03/2021 90 DS ? ?April D Calhoun,  [place] ?Clinical Pharmacist Assistant ?[phone]  ?

## 2022-01-27 ENCOUNTER — Other Ambulatory Visit: Payer: Self-pay | Admitting: Internal Medicine

## 2022-01-27 DIAGNOSIS — K219 Gastro-esophageal reflux disease without esophagitis: Secondary | ICD-10-CM

## 2022-01-29 ENCOUNTER — Other Ambulatory Visit: Payer: Self-pay | Admitting: Family Medicine

## 2022-02-10 ENCOUNTER — Other Ambulatory Visit: Payer: Self-pay | Admitting: Orthopedic Surgery

## 2022-02-10 DIAGNOSIS — S73192A Other sprain of left hip, initial encounter: Secondary | ICD-10-CM

## 2022-02-10 DIAGNOSIS — M25551 Pain in right hip: Secondary | ICD-10-CM | POA: Diagnosis not present

## 2022-02-25 ENCOUNTER — Other Ambulatory Visit: Payer: Medicare Other

## 2022-03-01 ENCOUNTER — Ambulatory Visit
Admission: RE | Admit: 2022-03-01 | Discharge: 2022-03-01 | Disposition: A | Discharge status: Home / Self Care | Payer: Medicare Other | Source: Ambulatory Visit | Attending: Orthopedic Surgery | Admitting: Orthopedic Surgery

## 2022-03-01 DIAGNOSIS — S73192A Other sprain of left hip, initial encounter: Secondary | ICD-10-CM

## 2022-03-01 DIAGNOSIS — M25511 Pain in right shoulder: Secondary | ICD-10-CM | POA: Diagnosis not present

## 2022-03-01 DIAGNOSIS — M25551 Pain in right hip: Secondary | ICD-10-CM | POA: Diagnosis not present

## 2022-03-01 MED ORDER — IOPAMIDOL (ISOVUE-M 200) INJECTION 41%
10.0000 mL | Freq: Once | INTRAMUSCULAR | Status: AC
Start: 2022-03-01 — End: 2022-03-01
  Administered 2022-03-01: 10 mL via INTRA_ARTICULAR

## 2022-03-05 DIAGNOSIS — M25551 Pain in right hip: Secondary | ICD-10-CM | POA: Diagnosis not present

## 2022-03-09 ENCOUNTER — Other Ambulatory Visit: Payer: Self-pay | Admitting: Family Medicine

## 2022-03-10 ENCOUNTER — Ambulatory Visit (HOSPITAL_COMMUNITY)
Admission: RE | Admit: 2022-03-10 | Discharge: 2022-03-10 | Disposition: A | Discharge status: Home / Self Care | Payer: Medicare Other | Source: Ambulatory Visit | Attending: Acute Care | Admitting: Acute Care

## 2022-03-10 DIAGNOSIS — J439 Emphysema, unspecified: Secondary | ICD-10-CM | POA: Diagnosis not present

## 2022-03-10 DIAGNOSIS — Z87891 Personal history of nicotine dependence: Secondary | ICD-10-CM | POA: Diagnosis not present

## 2022-03-10 DIAGNOSIS — I700 Atherosclerosis of aorta: Secondary | ICD-10-CM | POA: Diagnosis not present

## 2022-03-10 DIAGNOSIS — Z122 Encounter for screening for malignant neoplasm of respiratory organs: Secondary | ICD-10-CM | POA: Insufficient documentation

## 2022-03-12 ENCOUNTER — Other Ambulatory Visit: Payer: Self-pay | Admitting: Acute Care

## 2022-03-12 ENCOUNTER — Telehealth: Payer: Self-pay

## 2022-03-12 DIAGNOSIS — Z87891 Personal history of nicotine dependence: Secondary | ICD-10-CM

## 2022-03-12 DIAGNOSIS — Z122 Encounter for screening for malignant neoplasm of respiratory organs: Secondary | ICD-10-CM

## 2022-03-12 NOTE — Telephone Encounter (Signed)
Please schedule pt for surgery clearance OV.

## 2022-03-12 NOTE — Telephone Encounter (Signed)
Patient scheduled 5/23.

## 2022-03-16 ENCOUNTER — Ambulatory Visit (INDEPENDENT_AMBULATORY_CARE_PROVIDER_SITE_OTHER): Payer: Medicare Other | Admitting: Family Medicine

## 2022-03-16 ENCOUNTER — Encounter: Payer: Self-pay | Admitting: Family Medicine

## 2022-03-16 VITALS — BP 122/70 | HR 58 | Temp 98.0°F | Ht 62.0 in | Wt 154.4 lb

## 2022-03-16 DIAGNOSIS — Z0000 Encounter for general adult medical examination without abnormal findings: Secondary | ICD-10-CM

## 2022-03-16 DIAGNOSIS — J449 Chronic obstructive pulmonary disease, unspecified: Secondary | ICD-10-CM

## 2022-03-16 DIAGNOSIS — I10 Essential (primary) hypertension: Secondary | ICD-10-CM | POA: Diagnosis not present

## 2022-03-16 DIAGNOSIS — R319 Hematuria, unspecified: Secondary | ICD-10-CM | POA: Diagnosis not present

## 2022-03-16 DIAGNOSIS — E538 Deficiency of other specified B group vitamins: Secondary | ICD-10-CM

## 2022-03-16 DIAGNOSIS — E785 Hyperlipidemia, unspecified: Secondary | ICD-10-CM | POA: Diagnosis not present

## 2022-03-16 DIAGNOSIS — R739 Hyperglycemia, unspecified: Secondary | ICD-10-CM

## 2022-03-16 DIAGNOSIS — F3341 Major depressive disorder, recurrent, in partial remission: Secondary | ICD-10-CM

## 2022-03-16 DIAGNOSIS — I700 Atherosclerosis of aorta: Secondary | ICD-10-CM | POA: Diagnosis not present

## 2022-03-16 LAB — LIPID PANEL
Cholesterol: 216 mg/dL — ABNORMAL HIGH (ref 0–200)
HDL: 62.2 mg/dL (ref >39.00)
LDL Cholesterol: 137 mg/dL — ABNORMAL HIGH (ref 0–99)
NonHDL: 153.99
Total CHOL/HDL Ratio: 3
Triglycerides: 87 mg/dL (ref 0.0–149.0)
VLDL: 17.4 mg/dL (ref 0.0–40.0)

## 2022-03-16 LAB — CBC WITH DIFFERENTIAL/PLATELET
Basophils Absolute: 0.1 10*3/uL (ref 0.0–0.1)
Basophils Relative: 0.9 % (ref 0.0–3.0)
Eosinophils Absolute: 0.1 10*3/uL (ref 0.0–0.7)
Eosinophils Relative: 1.1 % (ref 0.0–5.0)
HCT: 43.6 % (ref 36.0–46.0)
Hemoglobin: 14.6 g/dL (ref 12.0–15.0)
Lymphocytes Relative: 26.8 % (ref 12.0–46.0)
Lymphs Abs: 2.5 10*3/uL (ref 0.7–4.0)
MCHC: 33.4 g/dL (ref 30.0–36.0)
MCV: 95.5 fl (ref 78.0–100.0)
Monocytes Absolute: 0.4 10*3/uL (ref 0.1–1.0)
Monocytes Relative: 4.8 % (ref 3.0–12.0)
Neutro Abs: 6.1 10*3/uL (ref 1.4–7.7)
Neutrophils Relative %: 66.4 % (ref 43.0–77.0)
Platelets: 227 10*3/uL (ref 150.0–400.0)
RBC: 4.57 Mil/uL (ref 3.87–5.11)
RDW: 15 % (ref 11.5–15.5)
WBC: 9.2 10*3/uL (ref 4.0–10.5)

## 2022-03-16 LAB — HEMOGLOBIN A1C: Hgb A1c MFr Bld: 5.8 % (ref 4.6–6.5)

## 2022-03-16 LAB — COMPREHENSIVE METABOLIC PANEL
ALT: 16 U/L (ref 0–35)
AST: 16 U/L (ref 0–37)
Albumin: 4.3 g/dL (ref 3.5–5.2)
Alkaline Phosphatase: 73 U/L (ref 39–117)
BUN: 12 mg/dL (ref 6–23)
CO2: 25 mEq/L (ref 19–32)
Calcium: 9.2 mg/dL (ref 8.4–10.5)
Chloride: 108 mEq/L (ref 96–112)
Creatinine, Ser: 0.88 mg/dL (ref 0.40–1.20)
GFR: 67.62 mL/min (ref >60.00)
Glucose, Bld: 112 mg/dL — ABNORMAL HIGH (ref 70–99)
Potassium: 3.8 mEq/L (ref 3.5–5.1)
Sodium: 140 mEq/L (ref 135–145)
Total Bilirubin: 0.5 mg/dL (ref 0.2–1.2)
Total Protein: 6.8 g/dL (ref 6.0–8.3)

## 2022-03-16 LAB — URINALYSIS, MICROSCOPIC ONLY: RBC / HPF: NONE SEEN (ref 0–?)

## 2022-03-16 LAB — VITAMIN B12: Vitamin B-12: 208 pg/mL — ABNORMAL LOW (ref 211–911)

## 2022-03-16 NOTE — Patient Instructions (Addendum)
Please check with your pharmacy to see if they have the shingrix vaccine. If they do- please get this immunization and update [place] by phone call or mychart with dates you receive the vaccine  Please call [facility] to schedule mammogram pretty please  Schedule your bone density test at check out desk.  - located [address] across the street from [place] - in the basement - you DO NEED an appointment for the bone density tests.   Any worsening depression or thoughts of self-harm contact us immediately  Recommended follow up: can cancel June physical and reschedule a follow up in 6 months or sooner if needed

## 2022-03-16 NOTE — Progress Notes (Signed)
Phone [phone]   Subjective:  Patient presents today for their annual physical and for preoperative evaluation. Chief complaint-noted.   See problem oriented charting- Review of Systems  Constitutional:  Negative for chills and fever.  HENT:  Negative for hearing loss and nosebleeds.   Eyes:  Negative for blurred vision and double vision.  Respiratory:  Positive for cough (stable) and shortness of breath.   Cardiovascular:  Negative for chest pain and palpitations.  Gastrointestinal:  Positive for diarrhea (long term issues- no acute change). Negative for abdominal pain, constipation, heartburn, nausea and vomiting.  Genitourinary:  Negative for dysuria and frequency.  Musculoskeletal:  Positive for back pain and joint pain. Negative for myalgias and neck pain.  Skin:  Negative for itching and rash.  Neurological:  Negative for dizziness and headaches.  Endo/Heme/Allergies:  Negative for polydipsia. Does not bruise/bleed easily.  Psychiatric/Behavioral:  Negative for depression and suicidal ideas.    The following were reviewed and entered/updated in epic: Past Medical History:  Diagnosis Date   Anxiety    Arthritis    back, neck   Chronic back pain    COPD (chronic obstructive pulmonary disease) (HCC)    Depression    Dyspnea    Since covid , NOW ON INHALERS   Fatty liver    Fibromyalgia    GERD (gastroesophageal reflux disease)    Heart murmur    Hematuria    History of adenomatous polyp of colon    History of COVID-19 10/2020   History of kidney stones    History of seizure    x1 2007 post op (per pt neurologist work-up done ?mixture of anesthesia medication and prozac that pt had taken)  and has not any issues since    History of squamous cell carcinoma excision    Hyperlipidemia    Hypertension    Hypothyroidism    IC (interstitial cystitis)    Long COVID    Migraine    Osteoporosis    Pneumonia    history of   Pulmonary nodules    multiple per ct --  monitored by pcp   Right ureteral stone    RLS (restless legs syndrome)    Patient Active Problem List   Diagnosis Date Noted   COPD (chronic obstructive pulmonary disease) ([place]) 11/24/2020    Priority: High   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 04/21/2007    Priority: High   Aortic atherosclerosis ([place]) 03/31/2021    Priority: Medium    Gout 06/28/2014    Priority: Medium    Vitamin B12 deficiency 06/28/2014    Priority: Medium    Multiple pulmonary nodules 06/28/2014    Priority: Medium    Essential hypertension 05/17/2014    Priority: Medium    RESTLESS LEG SYNDROME, SEVERE 03/14/2009    Priority: Medium    Chronic interstitial cystitis 01/20/2009    Priority: Medium    Depression, major, recurrent ([place]) 03/19/2008    Priority: Medium    Hyperlipidemia 02/17/2008    Priority: Medium    Former smoker 12/21/2007    Priority: Medium    FIBROMYALGIA 04/21/2007    Priority: Medium    Obesity (BMI 30-39.9) 01/14/2014    Priority: Low   Lateral epicondylitis (tennis elbow) 01/02/2014    Priority: Low   GERD (gastroesophageal reflux disease) 03/26/2013    Priority: Low   LEG CRAMPS 05/16/2009    Priority: Low   TUBULOVILLOUS ADENOMA, COLON 02/17/2008    Priority:  Low   NEPHROLITHIASIS 02/17/2008    Priority: Low   MIGRAINE, CLASSICAL W/O INTRACTABLE MIGRAINE 06/09/2007    Priority: Low   SYMPTOM, MEMORY LOSS 06/09/2007    Priority: Low   S/P right hemicolectomy 06/12/2021   History of total hysterectomy 07/17/2019   Hypothyroidism 07/17/2019   Vertigo 11/06/2016   Dyspnea 11/02/2014   Upper airway cough syndrome 11/02/2014   Past Surgical History:  Procedure Laterality Date   CARDIOVASCULAR STRESS TEST  09/02/2004   normal perfusion study/  normal LV function and wall motion, ef 64%   CARPAL TUNNEL RELEASE Right    CERVICAL FUSION  1992   CHOLECYSTECTOMY  1995   COLONOSCOPY     CYSTO/  BOTOX INJECTION  12-27-2008  &  04-01-2006    CYSTO/  HYDRODISTENTION/  INSTILLATION THERAPY  03/04/2006   CYSTOSCOPY W/ URETERAL STENT PLACEMENT Right 04/02/2016   Procedure: CYSTOSCOPY WITH RIGHT RETROGRADE, URETEROSCOPY PYELOGRAM LASER [place];  Surgeon: Kathie Rhodes, MD;  Location: [place];  Service: Urology;  Laterality: Right;   HEMORRHOID SURGERY  01/03/2004   HOLMIUM LASER APPLICATION Right 08/65/7846   Procedure: HOLMIUM LASER APPLICATION;  Surgeon: Kathie Rhodes, MD;  Location: [facility];  Service: Urology;  Laterality: Right;   LAPAROSCOPIC RIGHT HEMI COLECTOMY N/A 06/12/2021   Procedure: LAPAROSCOPIC RIGHT HEMI COLECTOMY;  Surgeon: Ileana Roup, MD;  Location: [facility];  Service: General;  Laterality: N/A;   LUMBAR [place]   L4 --S1   ROTATOR CUFF REPAIR Left 2004   SHOULDER ARTHROSCOPY WITH BICEPS TENDON REPAIR Left 12/2015   SPINAL CORD STIMULATOR IMPLANT  x4  last one 2013   STONE EXTRACTION WITH BASKET Right 04/02/2016   Procedure: STONE EXTRACTION WITH BASKET;  Surgeon: Kathie Rhodes, MD;  Location[facility];  Service: Urology;  Laterality: Right;   TONSILLECTOMY  1964   TRANSTHORACIC ECHOCARDIOGRAM  04/17/2014   EF 60-65%/  trivial AR, MR, and TR   UPPER GASTROINTESTINAL ENDOSCOPY     VAGINAL HYSTERECTOMY  1984   WRIST GANGLION EXCISION Right 02/05/2004    Family History  Problem Relation Age of Onset   Heart attack Mother        Mom 25, Dad 83   Hypertension Mother    Heart disease Mother    Heart disease Father    Diabetes Father    Colon cancer Maternal Grandmother        not sure age of onset   Esophageal cancer Neg Hx    Rectal cancer Neg Hx    Stomach cancer Neg Hx     Medications- reviewed and updated Current Outpatient Medications  Medication Sig Dispense Refill   albuterol (VENTOLIN HFA) 108 (90 Base) MCG/ACT inhaler INHALE 2 PUFFS EVERY 6 HOURS AS NEEDED. 8.5 g 0   allopurinol  (ZYLOPRIM) 100 MG tablet TAKE 1 TABLET BY MOUTH  DAILY 90 tablet 3   amLODipine (NORVASC) 2.5 MG tablet TAKE 1 TABLET BY MOUTH  DAILY 90 tablet 3   baclofen (LIORESAL) 10 MG tablet TAKE 1/2 TO 1 TABLET BY  MOUTH 3 TIMES DAILY (Patient taking differently: TAKE 1/2 TO 1 TABLET AS NEEDED) 180 tablet 0   cyanocobalamin (,VITAMIN B-12,) 1000 MCG/ML injection 1000 mcg (1 mg) injection once per per month or as directed 30 mL 1   diclofenac sodium (VOLTAREN) 1 % GEL Apply 4 g topically 4 (four) times daily. 100 g 1  escitalopram (LEXAPRO) 20 MG tablet TAKE 1 TABLET BY MOUTH  DAILY 90 tablet 3   ezetimibe (ZETIA) 10 MG tablet TAKE 1 TABLET BY MOUTH  DAILY 90 tablet 3   famotidine (PEPCID) 20 MG tablet TAKE 1 TABLET BY MOUTH AT  BEDTIME 90 tablet 1   levothyroxine (SYNTHROID, LEVOTHROID) 50 MCG tablet Take 50 mcg by mouth daily before breakfast.     oxybutynin (DITROPAN) 5 MG tablet TAKE 1 TABLET BY MOUTH 2  TIMES DAILY AS NEEDED FOR  BLADDER SPASMS. (Patient taking differently: TAKE 1 TABLET BY MOUTH 2  TIMES DAILY AS NEEDED FOR  BLADDER SPASMS.) 180 tablet 3   pantoprazole (PROTONIX) 40 MG tablet TAKE 1 TABLET BY MOUTH  TWICE DAILY 30 TO 60  MINUTES BEFORE MEALS 180 tablet 3   promethazine (PHENERGAN) 12.5 MG tablet Take 12.5 mg by mouth every 6 (six) hours as needed.     rosuvastatin (CRESTOR) 40 MG tablet TAKE 1 TABLET BY MOUTH  DAILY 90 tablet 3   SPIRIVA RESPIMAT 2.5 MCG/ACT AERS INHALE 2 PUFFS INTO THE LUNGS ONCE DAILY. 4 g 0   topiramate (TOPAMAX) 200 MG tablet TAKE 1 TABLET BY MOUTH  DAILY 90 tablet 3   No current facility-administered medications for this visit.    Allergies-reviewed and updated Allergies  Allergen Reactions   Latex Other (See Comments)    blisters   Oxycodone Nausea And Vomiting    And sweating   Zofran [Ondansetron Hcl] Other (See Comments)    headache    Social History   Social History Narrative   Lives with spouse, Antony Haste.    Caffeine use: 1-2 cups coffee per day    Married 47 years in December 2020. 1 daughter. 2 grandkids (boy and girl). Lives 10 miles away.       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       Hobbies: previously enjoyed dancing, old car shoes (57 chevy), reading   Objective  Objective:  BP 122/70 (BP Location: Left Arm, Patient Position: Sitting, Cuff Size: Large)   Pulse (!) 58   Temp 98 F (36.7 C) (Temporal)   Ht '5\' 2"'$  (1.575 m)   Wt 154 lb 6.1 oz (70 kg)   SpO2 97%   BMI 28.24 kg/m  Gen: NAD, resting comfortably HEENT: Mucous membranes are moist. Oropharynx normal Neck: no thyromegaly CV: RRR no murmurs rubs or gallops Lungs: CTAB no crackles, wheeze, rhonchi Abdomen: soft/nontender/nondistended/normal bowel sounds. No rebound or guarding.  Ext: no edema Skin: warm, dry Neuro: grossly normal, moves all extremities, PERRLA   Assessment and Plan   66 y.o. female presenting for annual physical.  Health Maintenance counseling: 1. Anticipatory guidance: Patient counseled regarding regular dental exams -q6 months, eye exams - ideally yearly- plans to schedule,  avoiding smoking and second hand smoke- occasional second hand encoruaged to avoid , limiting alcohol to 1 beverage per day- occasional wine , no illicit drugs .   2. Risk factor reduction:  Advised patient of need for regular exercise and diet rich and fruits and vegetables to reduce risk of heart attack and stroke.  Exercise- limited by her hip recently Diet/weight management-weight overall stable-mild weight loss suggested  Wt Readings from Last 3 Encounters:  03/16/22 154 lb 6.1 oz (70 kg)  10/14/21 152 lb 12.8 oz (69.3 kg)  10/02/21 152 lb 12.8 oz (69.3 kg)   3. Immunizations/screenings/ancillary studies- wants to hold off on further covid shots. Still plans on shingrix at pharmacy Immunization  History  Administered Date(s) Administered   Fluad Quad(high Dose 65+) 07/17/2019, 10/02/2021   Influenza Whole 08/11/1999, 08/11/2007, 07/25/2009    Influenza,inj,Quad PF,6+ Mos 09/03/2013, 07/12/2014, 08/04/2015   Influenza-Unspecified 07/25/2017   Moderna Sars-Covid-2 Vaccination 11/17/2019, 12/17/2019, 08/28/2020   PNEUMOCOCCAL CONJUGATE-20 10/02/2021   Pneumococcal Polysaccharide-23 07/17/2019   Td 10/25/1998, 10/25/2006   Tdap 04/20/2018  4. Cervical cancer screening- past age based screening requirements. Previous hysterectomy for benign reasons as well and was told did not need further pap smears- sounds like had cervix removed 5. Breast cancer screening-  declines breast exam.  [facility]- she plans to call to schedule ASAP 6. Colon cancer screening - 04/07/21 with 1 year follow up  due again due to polyp history- pushed back with surgeyr most likely 7. Skin cancer screening- sees dermatology yearly. advised regular sunscreen use. Denies worrisome, changing, or new skin lesions- possible SK on right arm but plans to schedule visit 8. Birth control/STD check- only active with husband- but not currently 5. Osteoporosis screening at 30- due for this- can schedule at desk 10. Smoking associated screening - former smoker quit 2009 and enrolled in lung cancer screening pgroam- done within a week  Status of chronic or acute concerns    # presurgical evaluation S:has upcoming right total hip replacement with murphy/wainer likely next month . Received preoperative risk assessment forms. Will be done at [facility] long.   Able to climb 2 flights of stairs without chest pain. Stable shortness of breath with known COPD.  Had echocardiogram 06/09/2021 through cardiology with normal ejection fraction, mild aortic regurgitation potentially moderate.  Also at that time had myocardial perfusion scan with Dr. Harrington Challenger which was low risk and no ischemia was noted.  Shortness of breath has not thought to be cardiac and at that time was considered low cardiac risk from Dr. Harrington Challenger. A/P: With prior excellent evaluation by Dr. Harrington Challenger within a year and no change  in her symptoms-no further cardiac work-up is required at this time-we will update blood work to make sure this is stable but if it is we will submit paperwork for surgical clearance  # COPD S: Noted on chest x-ray 2016.  Patient is used albuterol as needed for wheezing or shortness of breath.  In 2022 after COVID ongoing cough/shortness of breath and we opted to try Spiriva A/P:improved overall- rare albuterol- continue current meds  -occasionally pollen will bother her  #Hypertension S: Compliant with amlodipine 2.5 mg A/P: Controlled. Continue current medications.    # Hyperlipidemia with aortic atherosclerosis S: compliant with rosuvastatin '40mg'$ - changed to this on 12/05/2018 given elevated LDL.  Also on Zetia 10 mg now Lab Results  Component Value Date   CHOL 130 03/31/2021   HDL 50.70 03/31/2021   LDLCALC 64 03/31/2021   LDLDIRECT 170.0 07/17/2019   TRIG 78.0 03/31/2021   CHOLHDL 3 03/31/2021   A/P: Excellent control most recent check-continue current medications  LDL goal is under 70 with aortic atherosclerosis-has been at goal and update with labs today  #hypothyroidism S: compliant On thyroid medication-levothyroxine 50 mcg follows with endocrine Lab Results  Component Value Date   TSH 1.76 03/31/2021   A/P:wants to hold off on TSH and has follow up in september   # Hyperglycemia/insulin resistance/prediabetes S:  Medication: none Lab Results  Component Value Date   HGBA1C 5.7 (H) 06/02/2021   A/P: hopefully stable- update a1c today. Continue current meds for now. Work on healthy eating and mild weight loss   #  Gout S: Compliant with allopurinol 100 mg. No gout flares recently- with uric acid under 6 on last check A/P: with no flares and excellent uric acid opted to hold off until next year  #Depression S: compliant with lexapro '20mg'$ .     03/16/2022   12:37 PM 10/02/2021    2:10 PM 10/02/2021    1:18 PM  Depression screen PHQ 2/9  Decreased Interest 1 0 0   Down, Depressed, Hopeless 1 0 0  PHQ - 2 Score 2 0 0  Altered sleeping 1 0   Tired, decreased energy 1 1   Change in appetite 1 0   Feeling bad or failure about yourself  0 0   Trouble concentrating 1 0   Moving slowly or fidgety/restless 0 0   Suicidal thoughts 0 0   PHQ-9 Score 6 1   Difficult doing work/chores Somewhat difficult Not difficult at all   A/P:  PHQ9 of 6- feel slike situational related to illness such as hip surgery being needed- wants to monitor and continue current meds for now- will reach out if worsening particularly any thoughts of self harm.   #vitamin b12 deficiency S: compliant with b12 1011mg injections monthly Lab Results  Component Value Date   VITAMINB12 266 03/31/2021  A/P: update b12- hopefully stabe  #overactive bladder S: OAB using oxybutynin sparingly- helpful when used A/P: working well when needed- monitor  #Gerd/history of esophageal stricture S: doing well on protonix '40mg'$  BID A/P: .shstabe  #Hematuria-at time of last UTI.  Patient reports has had intermittent issues related to interstitial cystitis (perhaps mild twinges of pain) but prior urines have been negative for blood-we will check urine microscopic at this time-not symptomatic at present   Recommended follow up: Return in about 6 months (around 09/16/2022) for followup or sooner if needed.Schedule b4 you leave. Future Appointments  Date Time Provider [place] 04/02/2022  1:20 PM HMarin Olp MD [facility]  06/28/2022 10:15 AM [facility] HEALTH COACH [facility]   Lab/Order associations: FASTING   ICD-10-CM   1. Preventative health care  Z00.00 Urine Microscopic    CBC with Differential/Platelet    Comprehensive metabolic panel    Lipid panel    Hemoglobin A1c    Vitamin B12    2. Hematuria, unspecified type  R31.9 Urine Microscopic    3. Essential hypertension  I10     4. Hyperlipidemia, unspecified hyperlipidemia type  E78.5 CBC with Differential/Platelet     Comprehensive metabolic panel    Lipid panel    5. Aortic atherosclerosis (HCC)  I70.0     6. Chronic obstructive pulmonary disease, unspecified COPD type (HCC) Chronic J44.9     7. Recurrent major depressive disorder, in partial remission (HCC) Chronic F33.41     8. B12 deficiency  E53.8 Vitamin B12    9. Hyperglycemia  R73.9 Hemoglobin A1c     No orders of the defined types were placed in this encounter.   Return precautions advised.  SGarret Reddish MD

## 2022-03-29 DIAGNOSIS — Z1231 Encounter for screening mammogram for malignant neoplasm of breast: Secondary | ICD-10-CM | POA: Diagnosis not present

## 2022-03-29 LAB — HM MAMMOGRAPHY

## 2022-04-02 ENCOUNTER — Encounter: Payer: Medicare Other | Admitting: Family Medicine

## 2022-04-22 ENCOUNTER — Other Ambulatory Visit: Payer: Self-pay | Admitting: Internal Medicine

## 2022-04-26 DIAGNOSIS — M25551 Pain in right hip: Secondary | ICD-10-CM | POA: Diagnosis not present

## 2022-05-03 NOTE — Patient Instructions (Signed)
DUE TO COVID-19 ONLY TWO VISITORS  (aged 68 and older)  ARE ALLOWED TO COME WITH YOU AND STAY IN THE WAITING ROOM ONLY DURING PRE OP AND PROCEDURE.   **NO VISITORS ARE ALLOWED IN THE SHORT STAY AREA OR RECOVERY ROOM!!**  IF YOU WILL BE ADMITTED INTO THE HOSPITAL YOU ARE ALLOWED ONLY FOUR SUPPORT PEOPLE DURING VISITATION HOURS ONLY (7 AM -8PM)   The support person(s) must pass our screening, gel in and out, and wear a mask at all times, including in the patient's room. Patients must also wear a mask when staff or their support person are in the room. Visitors GUEST BADGE MUST BE WORN VISIBLY  One adult visitor may remain with you overnight and MUST be in the room by 8 P.M.     Your procedure is scheduled on: 05/18/22   Report to [facility] Main Entrance    Report to admitting at  7:35 AM   Call this number if you have problems the morning of surgery [phone]   Do not eat food :After Midnight.   After Midnight you may have the following liquids until __7:00____ AM/ DAY OF SURGERY  Water Black Coffee (sugar ok, NO MILK/CREAM OR CREAMERS)  Tea (sugar ok, NO MILK/CREAM OR CREAMERS) regular and decaf                             Plain Jell-O (NO RED)                                           Fruit ices (not with fruit pulp, NO RED)                                     Popsicles (NO RED)                                                                  Juice: apple, WHITE grape, WHITE cranberry Sports drinks like Gatorade (NO RED) Clear broth(vegetable,chicken,beef)                    The day of surgery:  Drink ONE (1) Pre-Surgery Clear Ensure at  6:45AM the morning of surgery. Drink in one sitting. Do not sip.  This drink was given to you during your hospital  pre-op appointment visit. Nothing else to drink after completing the  Pre-Surgery Clear Ensure 7:00          If you have questions, please contact your surgeon's office.     Oral Hygiene is also important to  reduce your risk of infection.                                    Remember - BRUSH YOUR TEETH THE MORNING OF SURGERY WITH YOUR REGULAR TOOTHPASTE    Take these medicines the morning of surgery with A SIP OF WATER: Topamax,Lexapro, Amlodipine, Levothyroxine, Pantoprazole  Use your inhalers and bring them with you   Bring CPAP mask and tubing day of surgery.                              You may not have any metal on your body including hair pins, jewelry, and body piercing             Do not wear make-up, lotions, powders, perfumes/cologne, or deodorant  Do not wear nail polish including gel and S&S, artificial/acrylic nails, or any other type of covering on natural nails including finger and toenails. If you have artificial nails, gel coating, etc. that needs to be removed by a nail salon please have this removed prior to surgery or surgery may need to be canceled/ delayed if the surgeon/ anesthesia feels like they are unable to be safely monitored.   Do not shave  48 hours prior to surgery.     Do not bring valuables to the hospital. [place].   Contacts, dentures or bridgework may not be worn into surgery.   Bring small overnight bag day of surgery.   DO NOT [place]. PHARMACY WILL DISPENSE MEDICATIONS LISTED ON YOUR MEDICATION LIST TO YOU DURING YOUR ADMISSION [place]!       Special Instructions: Bring a copy of your healthcare power of attorney and living will documents the day of surgery if you haven't scanned them before.              Please read over the following fact sheets you were given: IF YOU HAVE QUESTIONS ABOUT YOUR PRE-OP INSTRUCTIONS PLEASE CALL [phone]     [facility] - Preparing for Surgery Before surgery, you can play an important role.  Because skin is not sterile, your skin needs to be as free of germs as possible.  You can reduce the number of germs  on your skin by washing with CHG (chlorahexidine gluconate) soap before surgery.  CHG is an antiseptic cleaner which kills germs and bonds with the skin to continue killing germs even after washing. Please DO NOT use if you have an allergy to CHG or antibacterial soaps.  If your skin becomes reddened/irritated stop using the CHG and inform your nurse when you arrive at Short Stay. Do not shave (including legs and underarms) for at least 48 hours prior to the first CHG shower.  Please follow these instructions carefully:  1.  Shower with CHG Soap the night before surgery and the  morning of Surgery.  2.  If you choose to wash your hair, wash your hair first as usual with your  normal  shampoo.  3.  After you shampoo, rinse your hair and body thoroughly to remove the  shampoo.                            4.  Use CHG as you would any other liquid soap.  You can apply chg directly  to the skin and wash                       Gently with a scrungie or clean washcloth.  5.  Apply the CHG Soap to your body ONLY FROM THE NECK DOWN.   Do not  use on face/ open                           Wound or open sores. Avoid contact with eyes, ears mouth and genitals (private parts).                       Wash face,  Genitals (private parts) with your normal soap.             6.  Wash thoroughly, paying special attention to the area where your surgery  will be performed.  7.  Thoroughly rinse your body with warm water from the neck down.  8.  DO NOT shower/wash with your normal soap after using and rinsing off  the CHG Soap.             9.  Pat yourself dry with a clean towel.            10.  Wear clean pajamas.            11.  Place clean sheets on your bed the night of your first shower and do not  sleep with pets. Day of Surgery : Do not apply any lotions/deodorants the morning of surgery.  Please wear clean clothes to the hospital/surgery center.  FAILURE TO FOLLOW THESE INSTRUCTIONS MAY RESULT IN THE CANCELLATION  OF YOUR SURGERY   ________________________________________________________________________   Incentive Spirometer  An incentive spirometer is a tool that can help keep your lungs clear and active. This tool measures how well you are filling your lungs with each breath. Taking long deep breaths may help reverse or decrease the chance of developing breathing (pulmonary) problems (especially infection) following: A long period of time when you are unable to move or be active. BEFORE THE PROCEDURE  If the spirometer includes an indicator to show your best effort, your nurse or respiratory therapist will set it to a desired goal. If possible, sit up straight or lean slightly forward. Try not to slouch. Hold the incentive spirometer in an upright position. INSTRUCTIONS FOR USE  Sit on the edge of your bed if possible, or sit up as far as you can in bed or on a chair. Hold the incentive spirometer in an upright position. Breathe out normally. Place the mouthpiece in your mouth and seal your lips tightly around it. Breathe in slowly and as deeply as possible, raising the piston or the ball toward the top of the column. Hold your breath for 3-5 seconds or for as long as possible. Allow the piston or ball to fall to the bottom of the column. Remove the mouthpiece from your mouth and breathe out normally. Rest for a few seconds and repeat Steps 1 through 7 at least 10 times every 1-2 hours when you are awake. Take your time and take a few normal breaths between deep breaths. The spirometer may include an indicator to show your best effort. Use the indicator as a goal to work toward during each repetition. After each set of 10 deep breaths, practice coughing to be sure your lungs are clear. If you have an incision (the cut made at the time of surgery), support your incision when coughing by placing a pillow or rolled up towels firmly against it. Once you are able to get out of bed, walk around indoors  and cough well. You may stop using the incentive spirometer when instructed by your caregiver.  RISKS AND  COMPLICATIONS Take your time so you do not get dizzy or light-headed. If you are in pain, you may need to take or ask for pain medication before doing incentive spirometry. It is harder to take a deep breath if you are having pain. AFTER USE Rest and breathe slowly and easily. It can be helpful to keep track of a log of your progress. Your caregiver can provide you with a simple table to help with this. If you are using the spirometer at home, follow these instructions: [place] IF:  You are having difficultly using the spirometer. You have trouble using the spirometer as often as instructed. Your pain medication is not giving enough relief while using the spirometer. You develop fever of 100.5 F (38.1 C) or higher. SEEK IMMEDIATE MEDICAL CARE IF:  You cough up bloody sputum that had not been present before. You develop fever of 102 F (38.9 C) or greater. You develop worsening pain at or near the incision site. MAKE SURE YOU:  Understand these instructions. Will watch your condition. Will get help right away if you are not doing well or get worse. Document Released: 02/21/2007 Document Revised: 01/03/2012 Document Reviewed: 04/24/2007 [facility] Patient Information 2014 [place], [place].   ________________________________________________________________________

## 2022-05-03 NOTE — Care Plan (Signed)
Ortho Bundle Case Management Note  Patient Details  Name: Alison Brooks MRN: [id number] Date of Birth: March 27, 1956   Met with patient in the office prior to surgery. She will discharge to home with family to assist. Rolling walker ordered for home use. OPPT set up with [facility]- [facility]. Patient and MD in agreement with plan. Choice offered                   DME Arranged:  Walker rolling DME Agency:  [employer]  HH Arranged:    [place] Agency:     Additional Comments: Please contact me with any questions of if this plan should need to change.  Ladell Heads,  [place] Specialist  [phone] 05/03/2022, 11:19 AM

## 2022-05-03 NOTE — H&P (Signed)
HIP ARTHROPLASTY ADMISSION H&P  Patient ID: Alison Brooks MRN: [id number] DOB/AGE: [birth year] 66 y.o.  Chief Complaint: right hip pain.  Planned Procedure Date: 05/18/22 Medical and Cardiac Clearance by Dr. Rushie Chestnut     HPI: Alison Brooks is a 66 y.o. female who presents for evaluation of OA RIGHT HIP. The patient has a history of pain and functional disability in the right hip due to arthritis and has failed non-surgical conservative treatments for greater than 12 weeks to include NSAID's and/or analgesics, corticosteriod injections, supervised PT with diminished ADL's post treatment, use of assistive devices, and activity modification.  Onset of symptoms was gradual, starting 1 year ago with gradually worsening course since that time. The patient noted no past surgery on the right hip.  Patient currently rates pain at 9 out of 10 with activity. Patient has night pain, worsening of pain with activity and weight bearing, and pain that interferes with activities of daily living.  Patient has evidence of subchondral sclerosis, periarticular osteophytes, and joint space narrowing by imaging studies.  There is no active infection.  Past Medical History:  Diagnosis Date   Anxiety    Arthritis    back, neck   Chronic back pain    COPD (chronic obstructive pulmonary disease) (HCC)    Depression    Dyspnea    Since covid , NOW ON INHALERS   Fatty liver    Fibromyalgia    GERD (gastroesophageal reflux disease)    Heart murmur    Hematuria    History of adenomatous polyp of colon    History of COVID-19 10/2020   History of kidney stones    History of seizure    x1 2007 post op (per pt neurologist work-up done ?mixture of anesthesia medication and prozac that pt had taken)  and has not any issues since    History of squamous cell carcinoma excision    Hyperlipidemia    Hypertension    Hypothyroidism    IC (interstitial cystitis)    Long COVID    Migraine    Osteoporosis     Pneumonia    history of   Pulmonary nodules    multiple per ct -- monitored by pcp   Right ureteral stone    RLS (restless legs syndrome)    Past Surgical History:  Procedure Laterality Date   CARDIOVASCULAR STRESS TEST  09/02/2004   normal perfusion study/  normal LV function and wall motion, ef 64%   CARPAL TUNNEL RELEASE Right    CERVICAL FUSION  1992   CHOLECYSTECTOMY  1995   COLONOSCOPY     CYSTO/  BOTOX INJECTION  12-27-2008  &  04-01-2006   CYSTO/  HYDRODISTENTION/  INSTILLATION THERAPY  03/04/2006   CYSTOSCOPY W/ URETERAL STENT PLACEMENT Right 04/02/2016   Procedure: CYSTOSCOPY WITH RIGHT RETROGRADE, URETEROSCOPY PYELOGRAM LASER [place];  Surgeon: Kathie Rhodes, MD;  Location: [place];  Service: Urology;  Laterality: Right;   HEMORRHOID SURGERY  01/03/2004   HOLMIUM LASER APPLICATION Right 67/89/3810   Procedure: HOLMIUM LASER APPLICATION;  Surgeon: Kathie Rhodes, MD;  Location: [facility];  Service: Urology;  Laterality: Right;   LAPAROSCOPIC RIGHT HEMI COLECTOMY N/A 06/12/2021   Procedure: LAPAROSCOPIC RIGHT HEMI COLECTOMY;  Surgeon: Ileana Roup, MD;  Location: [facility];  Service: General;  Laterality: N/A;   LUMBAR [place]   L4 --S1   ROTATOR CUFF REPAIR Left 2004  SHOULDER ARTHROSCOPY WITH BICEPS TENDON REPAIR Left 12/2015   SPINAL CORD STIMULATOR IMPLANT  x4  last one 2013   STONE EXTRACTION WITH BASKET Right 04/02/2016   Procedure: STONE EXTRACTION WITH BASKET;  Surgeon: Kathie Rhodes, MD;  Location[facility];  Service: Urology;  Laterality: Right;   TONSILLECTOMY  1964   TRANSTHORACIC ECHOCARDIOGRAM  04/17/2014   EF 60-65%/  trivial AR, MR, and TR   UPPER GASTROINTESTINAL ENDOSCOPY     VAGINAL HYSTERECTOMY  1984   WRIST GANGLION EXCISION Right 02/05/2004   Allergies  Allergen Reactions   Latex Other (See Comments)    blisters   Zofran  [Ondansetron Hcl] Nausea And Vomiting and Other (See Comments)    headache   Prior to Admission medications   Medication Sig Start Date End Date Taking? Authorizing Provider  acetaminophen (TYLENOL) 500 MG tablet Take 500-1,000 mg by mouth every 8 (eight) hours as needed for moderate pain.   Yes [provider]  albuterol (VENTOLIN HFA) 108 (90 Base) MCG/ACT inhaler INHALE 2 PUFFS EVERY 6 HOURS AS NEEDED. 02/01/22  Yes [name], MD  allopurinol (ZYLOPRIM) 100 MG tablet TAKE 1 TABLET BY MOUTH  DAILY Patient taking differently: Take 100 mg by mouth daily as needed (gout pain). 11/23/21  Yes [name], MD  amLODipine (NORVASC) 2.5 MG tablet TAKE 1 TABLET BY MOUTH  DAILY 06/03/21  Yes [name], MD  baclofen (LIORESAL) 10 MG tablet TAKE 1/2 TO 1 TABLET BY  MOUTH 3 TIMES DAILY Patient taking differently: Take 5-10 mg by mouth 3 (three) times daily as needed for muscle spasms. TAKE 1/2 TO 1 TABLET AS NEEDED 06/03/21  Yes [name], MD  cyanocobalamin (,VITAMIN B-12,) 1000 MCG/ML injection 1000 mcg (1 mg) injection once per per month or as directed 11/24/20  Yes [name], MD  diclofenac sodium (VOLTAREN) 1 % GEL Apply 4 g topically 4 (four) times daily. Patient taking differently: Apply 4 g topically 4 (four) times daily as needed (pain). 04/20/18  Yes Vivi Barrack, MD  escitalopram (LEXAPRO) 20 MG tablet TAKE 1 TABLET BY MOUTH  DAILY 11/23/21  Yes [name], MD  ezetimibe (ZETIA) 10 MG tablet TAKE 1 TABLET BY MOUTH  DAILY 03/10/22  Yes [name], MD  famotidine (PEPCID) 20 MG tablet TAKE 1 TABLET BY MOUTH AT  BEDTIME 04/23/22  Yes Irene Shipper, MD  ibuprofen (ADVIL) 200 MG tablet Take 400 mg by mouth every 8 (eight) hours as needed for moderate pain.   Yes [provider]  levothyroxine (SYNTHROID, LEVOTHROID) 50 MCG tablet Take 50 mcg by mouth daily before breakfast. 11/22/18  Yes [provider]  oxybutynin (DITROPAN) 5 MG  tablet TAKE 1 TABLET BY MOUTH 2  TIMES DAILY AS NEEDED FOR  BLADDER SPASMS. Patient taking differently: TAKE 1 TABLET BY MOUTH 2  TIMES DAILY AS NEEDED FOR  BLADDER SPASMS. 10/11/17  Yes [name], MD  pantoprazole (PROTONIX) 40 MG tablet TAKE 1 TABLET BY MOUTH  TWICE DAILY 30 TO 60  MINUTES BEFORE MEALS 01/28/22  Yes Irene Shipper, MD  rosuvastatin (CRESTOR) 40 MG tablet TAKE 1 TABLET BY MOUTH  DAILY 11/23/21  Yes [name], MD  SPIRIVA RESPIMAT 2.5 MCG/ACT AERS INHALE 2 PUFFS INTO THE LUNGS ONCE DAILY. 02/01/22  Yes [name], MD  topiramate (TOPAMAX) 200 MG tablet TAKE 1 TABLET BY MOUTH  DAILY 01/12/22  Yes [name], MD  orlistat (XENICAL) 120 MG capsule Take 1 capsule (120 mg total) by mouth 3 (three) times daily with meals. 05/30/15 02/29/16  [name], MD   Social History   Socioeconomic History   Marital status: Married    Spouse name: Antony Haste   Number of children: 1   Years of education: [employer] education level: Not on file  Occupational History   Occupation: Retired Marine scientist  Tobacco Use   Smoking status: Former    Packs/day: 1.25    Years: 40.00    Total pack years: 50.00    Types: Cigarettes    Quit date: 06/07/2008    Years since quitting: 13.9   Smokeless tobacco: Never  Vaping Use   Vaping Use: Never used  Substance and Sexual Activity   Alcohol use: Yes    Alcohol/week: 0.0 standard drinks of alcohol    Comment: VERY RARE   Drug use: No   Sexual activity: Not on file  Other Topics Concern   Not on file  Social History Narrative   Lives with spouse, Antony Haste.    Caffeine use: 1-2 cups coffee per day   Married 47 years in December 2020. 1 daughter. 2 grandkids (boy and girl). Lives 10 miles away.       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       Hobbies: previously enjoyed dancing, old car shoes (23 chevy), reading   Social Determinants of Health   Financial Resource Strain: Low Risk  (06/11/2021)   Overall Financial  Resource Strain (CARDIA)    Difficulty of Paying Living Expenses: Not hard at all  Food Insecurity: No Food Insecurity (06/11/2021)   Hunger Vital Sign    Worried About Running Out of Food in the Last Year: Never true    Ran Out of Food in the Last Year: Never true  Transportation Needs: No Transportation Needs (06/11/2021)   PRAPARE - Hydrologist (Medical): No    Lack of Transportation (Non-Medical): No  Physical Activity: Inactive (06/11/2021)   Exercise Vital Sign    Days of Exercise per Week: 0 days    Minutes of Exercise per Session: 0 min  Stress: No Stress Concern Present (06/11/2021)   [place]    Feeling of Stress : Not at all  Social Connections: Moderately Isolated (06/11/2021)   Social Connection and Isolation Panel [NHANES]    Frequency of Communication with Friends and Family: More than three times a week    Frequency of Social Gatherings with Friends and Family: More than three times a week    Attends Religious Services: Never    Marine scientist or Organizations: No    Attends Music therapist: Never    Marital Status: Married   Family History  Problem Relation Age of Onset   Heart attack Mother        Mom 58, Dad 67   Hypertension Mother    Heart disease Mother    Heart disease Father    Diabetes Father    Colon cancer Maternal Grandmother        not sure age of onset   Esophageal cancer Neg Hx    Rectal cancer Neg Hx    Stomach cancer Neg Hx     ROS: Currently denies lightheadedness, dizziness, Fever, chills, CP, SOB.   No personal history of DVT, PE, MI, or CVA. No loose teeth or dentures All  other systems have been reviewed and were otherwise currently negative with the exception of those mentioned in the HPI and as above.  Objective: Vitals: Ht: 5'2" Wt: 153.4 lbs Temp: 97.8 BP: 147/82 Pulse: 64 O2 98% on room air.   Physical  Exam: General: Alert, NAD. Trendelenberg Gait  HEENT: EOMI, Good Neck Extension  Pulm: No increased work of breathing.  Clear B/L A/P w/o crackle or wheeze.  CV: RRR, No g/r appreciated. High pitched decrescendo diastolic blowing murmur best heard along the left sternal border GI: soft, NT, ND. BS x 4 quadrants Neuro: CN II-XII grossly intact without focal deficit.  Sensation intact distally Skin: No lesions in the area of chief complaint MSK/Surgical Site: + TTP. Decreased hip and knee ROM d/t pain. + [place]inchfield. + SLR. + FABER/FADIR. Decreased strength.  NVI.    Imaging Review Plain radiographs demonstrate moderate degenerative joint disease of the right hip.   The bone quality appears to be fair for age and reported activity level.  Preoperative templating of the joint replacement has been completed, documented, and submitted to the Operating Room personnel in order to optimize intra-operative equipment management.  Assessment: OA RIGHT HIP Active Problems:   * No active hospital problems. *   Plan: Plan for Procedure(s): TOTAL HIP ARTHROPLASTY ANTERIOR APPROACH  The patient history, physical exam, clinical judgement of the provider and imaging are consistent with end stage degenerative joint disease and total joint arthroplasty is deemed medically necessary. The treatment options including medical management, injection therapy, and arthroplasty were discussed at length. The risks and benefits of Procedure(s): TOTAL HIP ARTHROPLASTY ANTERIOR APPROACH were presented and reviewed.  The risks of nonoperative treatment, versus surgical intervention including but not limited to continued pain, aseptic loosening, stiffness, dislocation/subluxation, infection, bleeding, nerve injury, blood clots, cardiopulmonary complications, morbidity, mortality, among others were discussed. The patient verbalizes understanding and wishes to proceed with the plan.  Patient is being admitted for surgery,  pain control, PT, prophylactic antibiotics, VTE prophylaxis, progressive ambulation, ADL's and discharge planning. She will spend the night in observation.  Dental prophylaxis discussed and recommended for 2 years postoperatively.  The patient does meet the criteria for TXA which will be used perioperatively.   ASA 81 mg BID will be used postoperatively for DVT prophylaxis in addition to SCDs, and early ambulation. Plan for Oxycodone, Mobic, Tylenol for pain.   Robaxin for muscle spasm.  Phenergan for nausea and vomiting. Cannot tolerate Zofran! Colace for constipation prevention which she already has at [place] The patient is planning to be discharged home with OPPT and into the care of her husband Antony Haste who can be reached at [phone] Follow up appt 06-02-22 at 4:15pm    Alisa Graff Office [phone] 05/03/2022 3:49 PM

## 2022-05-06 ENCOUNTER — Other Ambulatory Visit: Payer: Self-pay

## 2022-05-06 ENCOUNTER — Encounter (HOSPITAL_COMMUNITY)
Admission: RE | Admit: 2022-05-06 | Discharge: 2022-05-06 | Disposition: A | Discharge status: Home / Self Care | Payer: Medicare Other | Source: Ambulatory Visit | Attending: Orthopedic Surgery | Admitting: Orthopedic Surgery

## 2022-05-06 ENCOUNTER — Encounter (HOSPITAL_COMMUNITY): Payer: Self-pay

## 2022-05-06 DIAGNOSIS — I10 Essential (primary) hypertension: Secondary | ICD-10-CM | POA: Diagnosis not present

## 2022-05-06 DIAGNOSIS — Z01818 Encounter for other preprocedural examination: Secondary | ICD-10-CM | POA: Insufficient documentation

## 2022-05-06 LAB — CBC
HCT: 44.3 % (ref 36.0–46.0)
Hemoglobin: 14.4 g/dL (ref 12.0–15.0)
MCH: 31.9 pg (ref 26.0–34.0)
MCHC: 32.5 g/dL (ref 30.0–36.0)
MCV: 98 fL (ref 80.0–100.0)
Platelets: 206 10*3/uL (ref 150–400)
RBC: 4.52 MIL/uL (ref 3.87–5.11)
RDW: 14.1 % (ref 11.5–15.5)
WBC: 7.5 10*3/uL (ref 4.0–10.5)
nRBC: 0 % (ref 0.0–0.2)

## 2022-05-06 LAB — BASIC METABOLIC PANEL
Anion gap: 5 (ref 5–15)
BUN: 19 mg/dL (ref 8–23)
CO2: 22 mmol/L (ref 22–32)
Calcium: 8.9 mg/dL (ref 8.9–10.3)
Chloride: 112 mmol/L — ABNORMAL HIGH (ref 98–111)
Creatinine, Ser: 0.79 mg/dL (ref 0.44–1.00)
GFR, Estimated: >60 mL/min (ref >60)
Glucose, Bld: 92 mg/dL (ref 70–99)
Potassium: 4 mmol/L (ref 3.5–5.1)
Sodium: 139 mmol/L (ref 135–145)

## 2022-05-06 LAB — TYPE AND SCREEN
ABO/RH(D): O POS
Antibody Screen: NEGATIVE

## 2022-05-06 LAB — SURGICAL PCR SCREEN
MRSA, PCR: NEGATIVE
Staphylococcus aureus: NEGATIVE

## 2022-05-06 NOTE — Progress Notes (Signed)
Anesthesia note:  Bowel prep reminder:NA  PCP - Dr. Ansel Bong Cardiologist -none Other-   Chest x-ray - no EKG - 05/06/22-epic Stress Test - 2022 ECHO - 06/09/21-epic Cardiac Cath - NA  Pacemaker/ICD device last checked:NA  Sleep Study - no CPAP -   Pt is pre diabetic-NA Fasting Blood Sugar -  Checks Blood Sugar _____  Blood Thinner:NA Blood Thinner Instructions: Aspirin Instructions: Last Dose:  Anesthesia review: yes  Patient denies shortness of breath, fever, cough and chest pain at PAT appointment Pt has long Covid and developed COPD. She uses her inhaler everyday.She reports no SOB with activities at this time  Patient verbalized understanding of instructions that were given to them at the PAT appointment. Patient was also instructed that they will need to review over the PAT instructions again at home before surgery. yes

## 2022-05-13 ENCOUNTER — Encounter: Payer: Self-pay | Admitting: Internal Medicine

## 2022-05-18 ENCOUNTER — Other Ambulatory Visit: Payer: Self-pay

## 2022-05-18 ENCOUNTER — Encounter (HOSPITAL_COMMUNITY)
Admission: RE | Disposition: A | Discharge status: Home / Self Care | Payer: Self-pay | Source: Ambulatory Visit | Attending: Orthopedic Surgery

## 2022-05-18 ENCOUNTER — Ambulatory Visit (HOSPITAL_COMMUNITY): Payer: Medicare Other

## 2022-05-18 ENCOUNTER — Ambulatory Visit (HOSPITAL_COMMUNITY): Payer: Medicare Other | Admitting: Physician Assistant

## 2022-05-18 ENCOUNTER — Observation Stay (HOSPITAL_COMMUNITY): Payer: Medicare Other

## 2022-05-18 ENCOUNTER — Ambulatory Visit (HOSPITAL_BASED_OUTPATIENT_CLINIC_OR_DEPARTMENT_OTHER): Payer: Medicare Other | Admitting: Anesthesiology

## 2022-05-18 ENCOUNTER — Encounter (HOSPITAL_COMMUNITY): Payer: Self-pay | Admitting: Orthopedic Surgery

## 2022-05-18 ENCOUNTER — Observation Stay (HOSPITAL_COMMUNITY)
Admission: RE | Admit: 2022-05-18 | Discharge: 2022-05-19 | Disposition: A | Discharge status: Home / Self Care | Payer: Medicare Other | Source: Ambulatory Visit | Attending: Orthopedic Surgery | Admitting: Orthopedic Surgery

## 2022-05-18 DIAGNOSIS — Z87891 Personal history of nicotine dependence: Secondary | ICD-10-CM | POA: Insufficient documentation

## 2022-05-18 DIAGNOSIS — E039 Hypothyroidism, unspecified: Secondary | ICD-10-CM | POA: Diagnosis not present

## 2022-05-18 DIAGNOSIS — Z85828 Personal history of other malignant neoplasm of skin: Secondary | ICD-10-CM | POA: Diagnosis not present

## 2022-05-18 DIAGNOSIS — Z79899 Other long term (current) drug therapy: Secondary | ICD-10-CM | POA: Diagnosis not present

## 2022-05-18 DIAGNOSIS — M1611 Unilateral primary osteoarthritis, right hip: Secondary | ICD-10-CM | POA: Diagnosis not present

## 2022-05-18 DIAGNOSIS — I10 Essential (primary) hypertension: Secondary | ICD-10-CM | POA: Insufficient documentation

## 2022-05-18 DIAGNOSIS — Z91040 Latex allergy status: Secondary | ICD-10-CM | POA: Diagnosis not present

## 2022-05-18 DIAGNOSIS — Z01818 Encounter for other preprocedural examination: Secondary | ICD-10-CM

## 2022-05-18 DIAGNOSIS — J449 Chronic obstructive pulmonary disease, unspecified: Secondary | ICD-10-CM | POA: Diagnosis not present

## 2022-05-18 DIAGNOSIS — Z471 Aftercare following joint replacement surgery: Secondary | ICD-10-CM | POA: Diagnosis not present

## 2022-05-18 DIAGNOSIS — Z8616 Personal history of COVID-19: Secondary | ICD-10-CM | POA: Diagnosis not present

## 2022-05-18 DIAGNOSIS — Z96651 Presence of right artificial knee joint: Secondary | ICD-10-CM | POA: Diagnosis not present

## 2022-05-18 DIAGNOSIS — Z96641 Presence of right artificial hip joint: Secondary | ICD-10-CM

## 2022-05-18 DIAGNOSIS — Z9889 Other specified postprocedural states: Secondary | ICD-10-CM | POA: Diagnosis not present

## 2022-05-18 HISTORY — PX: TOTAL HIP ARTHROPLASTY: SHX124

## 2022-05-18 SURGERY — ARTHROPLASTY, HIP, TOTAL, ANTERIOR APPROACH
Anesthesia: Spinal | Site: Hip | Laterality: Right

## 2022-05-18 MED ORDER — OXYCODONE HCL 5 MG/5ML PO SOLN: 5.0000 mg | Freq: Once | ORAL | Status: AC | PRN

## 2022-05-18 MED ORDER — FENTANYL CITRATE (PF) 100 MCG/2ML IJ SOLN
INTRAMUSCULAR | Status: DC | PRN
  Administered 2022-05-18 (×2): 50 ug via INTRAVENOUS

## 2022-05-18 MED ORDER — ASPIRIN 81 MG PO CHEW
81.0000 mg | CHEWABLE_TABLET | Freq: Two times a day (BID) | ORAL | Status: DC
  Administered 2022-05-18 – 2022-05-19 (×2): 81 mg via ORAL
  Filled 2022-05-18 (×2): qty 1

## 2022-05-18 MED ORDER — DEXAMETHASONE SODIUM PHOSPHATE 10 MG/ML IJ SOLN
10.0000 mg | Freq: Once | INTRAMUSCULAR | Status: AC
  Administered 2022-05-19: 10 mg via INTRAVENOUS
  Filled 2022-05-18: qty 1

## 2022-05-18 MED ORDER — HYDROMORPHONE HCL 1 MG/ML IJ SOLN
INTRAMUSCULAR | Status: AC
  Filled 2022-05-18: qty 1

## 2022-05-18 MED ORDER — ORAL CARE MOUTH RINSE: 15.0000 mL | Freq: Once | OROMUCOSAL | Status: AC

## 2022-05-18 MED ORDER — HYDROMORPHONE HCL 1 MG/ML IJ SOLN
0.2500 mg | INTRAMUSCULAR | Status: DC | PRN
  Administered 2022-05-18: 0.25 mg via INTRAVENOUS

## 2022-05-18 MED ORDER — ALBUTEROL SULFATE (2.5 MG/3ML) 0.083% IN NEBU
3.0000 mL | INHALATION_SOLUTION | Freq: Four times a day (QID) | RESPIRATORY_TRACT | Status: DC | PRN
Start: 2022-05-18 — End: 2022-05-19

## 2022-05-18 MED ORDER — LACTATED RINGERS IV SOLN: INTRAVENOUS | Status: DC

## 2022-05-18 MED ORDER — PANTOPRAZOLE SODIUM 40 MG PO TBEC
40.0000 mg | DELAYED_RELEASE_TABLET | Freq: Every day | ORAL | Status: DC
  Administered 2022-05-18 – 2022-05-19 (×2): 40 mg via ORAL
  Filled 2022-05-18 (×2): qty 1

## 2022-05-18 MED ORDER — PROMETHAZINE HCL 25 MG PO TABS
12.5000 mg | ORAL_TABLET | Freq: Four times a day (QID) | ORAL | Status: DC | PRN
Start: 2022-05-18 — End: 2022-05-19

## 2022-05-18 MED ORDER — MENTHOL 3 MG MT LOZG: 1.0000 | LOZENGE | OROMUCOSAL | Status: DC | PRN

## 2022-05-18 MED ORDER — OXYCODONE HCL 5 MG PO TABS
5.0000 mg | ORAL_TABLET | ORAL | Status: DC | PRN
  Administered 2022-05-18: 5 mg via ORAL
  Administered 2022-05-19 (×2): 10 mg via ORAL
  Filled 2022-05-18: qty 1
  Filled 2022-05-18 (×2): qty 2

## 2022-05-18 MED ORDER — PHENYLEPHRINE HCL-NACL 20-0.9 MG/250ML-% IV SOLN
INTRAVENOUS | Status: AC
  Filled 2022-05-18: qty 250

## 2022-05-18 MED ORDER — TRANEXAMIC ACID-NACL 1000-0.7 MG/100ML-% IV SOLN
1000.0000 mg | INTRAVENOUS | Status: AC
  Administered 2022-05-18: 1000 mg via INTRAVENOUS
  Filled 2022-05-18: qty 100

## 2022-05-18 MED ORDER — CHLORHEXIDINE GLUCONATE 0.12 % MT SOLN
15.0000 mL | Freq: Once | OROMUCOSAL | Status: AC
  Administered 2022-05-18: 15 mL via OROMUCOSAL

## 2022-05-18 MED ORDER — PROMETHAZINE HCL 25 MG/ML IJ SOLN
INTRAMUSCULAR | Status: AC
  Filled 2022-05-18: qty 1

## 2022-05-18 MED ORDER — PROMETHAZINE HCL 25 MG/ML IJ SOLN: 12.5000 mg | Freq: Once | INTRAMUSCULAR | Status: DC | PRN

## 2022-05-18 MED ORDER — TOPIRAMATE 100 MG PO TABS
200.0000 mg | ORAL_TABLET | Freq: Every day | ORAL | Status: DC
  Administered 2022-05-19: 200 mg via ORAL
  Filled 2022-05-18: qty 2

## 2022-05-18 MED ORDER — DOCUSATE SODIUM 100 MG PO CAPS
100.0000 mg | ORAL_CAPSULE | Freq: Two times a day (BID) | ORAL | Status: DC
  Administered 2022-05-18 – 2022-05-19 (×2): 100 mg via ORAL
  Filled 2022-05-18 (×2): qty 1

## 2022-05-18 MED ORDER — MIDAZOLAM HCL 2 MG/2ML IJ SOLN
INTRAMUSCULAR | Status: AC
  Filled 2022-05-18: qty 2

## 2022-05-18 MED ORDER — POVIDONE-IODINE 10 % EX SWAB: 2.0000 "application " | Freq: Once | CUTANEOUS | Status: DC

## 2022-05-18 MED ORDER — BISACODYL 10 MG RE SUPP: 10.0000 mg | Freq: Every day | RECTAL | Status: DC | PRN

## 2022-05-18 MED ORDER — HYDROMORPHONE HCL 1 MG/ML IJ SOLN
0.5000 mg | INTRAMUSCULAR | Status: DC | PRN
  Administered 2022-05-18: 1 mg via INTRAVENOUS
  Filled 2022-05-18: qty 1

## 2022-05-18 MED ORDER — ROSUVASTATIN CALCIUM 20 MG PO TABS
40.0000 mg | ORAL_TABLET | Freq: Every day | ORAL | Status: DC
  Administered 2022-05-19: 40 mg via ORAL
  Filled 2022-05-18: qty 2

## 2022-05-18 MED ORDER — CEFAZOLIN SODIUM-DEXTROSE 2-4 GM/100ML-% IV SOLN
2.0000 g | INTRAVENOUS | Status: AC
Start: 2022-05-18 — End: 2022-05-18
  Administered 2022-05-18: 2 g via INTRAVENOUS
  Filled 2022-05-18: qty 100

## 2022-05-18 MED ORDER — EZETIMIBE 10 MG PO TABS
10.0000 mg | ORAL_TABLET | Freq: Every day | ORAL | Status: DC
  Administered 2022-05-19: 10 mg via ORAL
  Filled 2022-05-18: qty 1

## 2022-05-18 MED ORDER — PROPOFOL 1000 MG/100ML IV EMUL
INTRAVENOUS | Status: AC
  Filled 2022-05-18: qty 100

## 2022-05-18 MED ORDER — DEXAMETHASONE SODIUM PHOSPHATE 10 MG/ML IJ SOLN
INTRAMUSCULAR | Status: AC
  Filled 2022-05-18: qty 1

## 2022-05-18 MED ORDER — CEFAZOLIN SODIUM-DEXTROSE 1-4 GM/50ML-% IV SOLN
1.0000 g | Freq: Four times a day (QID) | INTRAVENOUS | Status: AC
  Administered 2022-05-18 (×2): 1 g via INTRAVENOUS
  Filled 2022-05-18 (×2): qty 50

## 2022-05-18 MED ORDER — METHOCARBAMOL 500 MG IVPB - SIMPLE MED
INTRAVENOUS | Status: AC
  Filled 2022-05-18: qty 55

## 2022-05-18 MED ORDER — UMECLIDINIUM BROMIDE 62.5 MCG/ACT IN AEPB
1.0000 | INHALATION_SPRAY | Freq: Every day | RESPIRATORY_TRACT | Status: DC
  Filled 2022-05-18: qty 7

## 2022-05-18 MED ORDER — BUPIVACAINE LIPOSOME 1.3 % IJ SUSP: 10.0000 mL | Freq: Once | INTRAMUSCULAR | Status: DC

## 2022-05-18 MED ORDER — SODIUM CHLORIDE FLUSH 0.9 % IV SOLN
INTRAVENOUS | Status: DC | PRN
  Administered 2022-05-18: 20 mL

## 2022-05-18 MED ORDER — PROMETHAZINE HCL 25 MG/ML IJ SOLN
6.2500 mg | Freq: Two times a day (BID) | INTRAMUSCULAR | Status: DC | PRN
Start: 2022-05-18 — End: 2022-05-19

## 2022-05-18 MED ORDER — SODIUM CHLORIDE (PF) 0.9 % IJ SOLN
INTRAMUSCULAR | Status: AC
  Filled 2022-05-18: qty 20

## 2022-05-18 MED ORDER — PHENYLEPHRINE 80 MCG/ML (10ML) SYRINGE FOR IV PUSH (FOR BLOOD PRESSURE SUPPORT)
PREFILLED_SYRINGE | INTRAVENOUS | Status: AC
  Filled 2022-05-18: qty 10

## 2022-05-18 MED ORDER — TRANEXAMIC ACID-NACL 1000-0.7 MG/100ML-% IV SOLN
1000.0000 mg | Freq: Once | INTRAVENOUS | Status: AC
  Administered 2022-05-18: 1000 mg via INTRAVENOUS
  Filled 2022-05-18: qty 100

## 2022-05-18 MED ORDER — OXYBUTYNIN CHLORIDE 5 MG PO TABS: 5.0000 mg | ORAL_TABLET | Freq: Two times a day (BID) | ORAL | Status: DC | PRN

## 2022-05-18 MED ORDER — DIPHENHYDRAMINE HCL 12.5 MG/5ML PO ELIX: 12.5000 mg | ORAL_SOLUTION | ORAL | Status: DC | PRN

## 2022-05-18 MED ORDER — PROMETHAZINE HCL 25 MG/ML IJ SOLN
6.2500 mg | INTRAMUSCULAR | Status: DC | PRN
  Administered 2022-05-18: 6.25 mg via INTRAVENOUS

## 2022-05-18 MED ORDER — ACETAMINOPHEN 325 MG PO TABS: 325.0000 mg | ORAL_TABLET | Freq: Four times a day (QID) | ORAL | Status: DC | PRN

## 2022-05-18 MED ORDER — DEXAMETHASONE SODIUM PHOSPHATE 10 MG/ML IJ SOLN
8.0000 mg | Freq: Once | INTRAMUSCULAR | Status: AC
  Administered 2022-05-18: 10 mg via INTRAVENOUS

## 2022-05-18 MED ORDER — METOCLOPRAMIDE HCL 5 MG PO TABS: 5.0000 mg | ORAL_TABLET | Freq: Three times a day (TID) | ORAL | Status: DC | PRN

## 2022-05-18 MED ORDER — 0.9 % SODIUM CHLORIDE (POUR BTL) OPTIME
TOPICAL | Status: DC | PRN
  Administered 2022-05-18: 1000 mL

## 2022-05-18 MED ORDER — METOCLOPRAMIDE HCL 5 MG/ML IJ SOLN: 5.0000 mg | Freq: Three times a day (TID) | INTRAMUSCULAR | Status: DC | PRN

## 2022-05-18 MED ORDER — FENTANYL CITRATE (PF) 100 MCG/2ML IJ SOLN
INTRAMUSCULAR | Status: AC
  Filled 2022-05-18: qty 2

## 2022-05-18 MED ORDER — FAMOTIDINE 20 MG PO TABS
20.0000 mg | ORAL_TABLET | Freq: Every day | ORAL | Status: DC
Start: 2022-05-18 — End: 2022-05-19
  Administered 2022-05-18: 20 mg via ORAL
  Filled 2022-05-18: qty 1

## 2022-05-18 MED ORDER — METHOCARBAMOL 500 MG PO TABS
500.0000 mg | ORAL_TABLET | Freq: Four times a day (QID) | ORAL | Status: DC | PRN
  Administered 2022-05-18: 500 mg via ORAL
  Filled 2022-05-18: qty 1

## 2022-05-18 MED ORDER — METHOCARBAMOL 500 MG IVPB - SIMPLE MED
500.0000 mg | Freq: Four times a day (QID) | INTRAVENOUS | Status: DC | PRN
  Administered 2022-05-18: 500 mg via INTRAVENOUS

## 2022-05-18 MED ORDER — ALUM & MAG HYDROXIDE-SIMETH 200-200-20 MG/5ML PO SUSP
30.0000 mL | ORAL | Status: DC | PRN
Start: 2022-05-18 — End: 2022-05-19

## 2022-05-18 MED ORDER — BUPIVACAINE LIPOSOME 1.3 % IJ SUSP
INTRAMUSCULAR | Status: DC | PRN
  Administered 2022-05-18: 10 mL

## 2022-05-18 MED ORDER — ACETAMINOPHEN 500 MG PO TABS
1000.0000 mg | ORAL_TABLET | Freq: Once | ORAL | Status: AC
  Administered 2022-05-18: 1000 mg via ORAL
  Filled 2022-05-18: qty 2

## 2022-05-18 MED ORDER — OXYCODONE HCL 5 MG PO TABS
5.0000 mg | ORAL_TABLET | Freq: Once | ORAL | Status: AC | PRN
  Administered 2022-05-18: 5 mg via ORAL

## 2022-05-18 MED ORDER — AMLODIPINE BESYLATE 5 MG PO TABS
2.5000 mg | ORAL_TABLET | Freq: Every day | ORAL | Status: DC
  Administered 2022-05-19: 2.5 mg via ORAL
  Filled 2022-05-18: qty 1

## 2022-05-18 MED ORDER — OXYCODONE HCL 5 MG PO TABS
ORAL_TABLET | ORAL | Status: AC
  Filled 2022-05-18: qty 1

## 2022-05-18 MED ORDER — PROPOFOL 500 MG/50ML IV EMUL
INTRAVENOUS | Status: DC | PRN
  Administered 2022-05-18: 50 ug/kg/min via INTRAVENOUS
  Administered 2022-05-18: 20 mg via INTRAVENOUS

## 2022-05-18 MED ORDER — OXYCODONE HCL 5 MG PO TABS
10.0000 mg | ORAL_TABLET | ORAL | Status: DC | PRN
  Administered 2022-05-18 (×2): 15 mg via ORAL
  Administered 2022-05-19: 10 mg via ORAL
  Filled 2022-05-18: qty 3
  Filled 2022-05-18: qty 2
  Filled 2022-05-18: qty 3

## 2022-05-18 MED ORDER — POLYETHYLENE GLYCOL 3350 17 G PO PACK: 17.0000 g | PACK | Freq: Every day | ORAL | Status: DC | PRN

## 2022-05-18 MED ORDER — LEVOTHYROXINE SODIUM 50 MCG PO TABS
50.0000 ug | ORAL_TABLET | Freq: Every day | ORAL | Status: DC
  Administered 2022-05-19: 50 ug via ORAL
  Filled 2022-05-18: qty 1

## 2022-05-18 MED ORDER — PHENYLEPHRINE HCL-NACL 20-0.9 MG/250ML-% IV SOLN
INTRAVENOUS | Status: DC | PRN
  Administered 2022-05-18: 40 ug/min via INTRAVENOUS

## 2022-05-18 MED ORDER — ACETAMINOPHEN 500 MG PO TABS
1000.0000 mg | ORAL_TABLET | Freq: Four times a day (QID) | ORAL | Status: AC
  Administered 2022-05-18 – 2022-05-19 (×3): 1000 mg via ORAL
  Filled 2022-05-18 (×3): qty 2

## 2022-05-18 MED ORDER — MIDAZOLAM HCL 5 MG/5ML IJ SOLN
INTRAMUSCULAR | Status: DC | PRN
  Administered 2022-05-18: 2 mg via INTRAVENOUS

## 2022-05-18 MED ORDER — TIOTROPIUM BROMIDE MONOHYDRATE 2.5 MCG/ACT IN AERS
5.0000 ug | INHALATION_SPRAY | Freq: Once | RESPIRATORY_TRACT | Status: DC
Start: 2022-05-18 — End: 2022-05-18

## 2022-05-18 MED ORDER — ESCITALOPRAM OXALATE 20 MG PO TABS
20.0000 mg | ORAL_TABLET | Freq: Every day | ORAL | Status: DC
  Administered 2022-05-19: 20 mg via ORAL
  Filled 2022-05-18: qty 1

## 2022-05-18 MED ORDER — POVIDONE-IODINE 10 % EX SWAB
Freq: Once | CUTANEOUS | Status: AC
  Administered 2022-05-18: 2 via TOPICAL

## 2022-05-18 MED ORDER — TRAMADOL HCL 50 MG PO TABS
50.0000 mg | ORAL_TABLET | Freq: Four times a day (QID) | ORAL | Status: DC
  Administered 2022-05-19 (×3): 50 mg via ORAL
  Filled 2022-05-18 (×3): qty 1

## 2022-05-18 MED ORDER — PHENYLEPHRINE 80 MCG/ML (10ML) SYRINGE FOR IV PUSH (FOR BLOOD PRESSURE SUPPORT)
PREFILLED_SYRINGE | INTRAVENOUS | Status: DC | PRN
  Administered 2022-05-18 (×4): 80 ug via INTRAVENOUS

## 2022-05-18 MED ORDER — PHENOL 1.4 % MT LIQD: 1.0000 | OROMUCOSAL | Status: DC | PRN

## 2022-05-18 SURGICAL SUPPLY — 47 items
BAG COUNTER SPONGE SURGICOUNT (BAG) ×1 IMPLANT
BAG ZIPLOCK 12X15 (MISCELLANEOUS) ×1 IMPLANT
BLADE SAG 18X100X1.27 (BLADE) ×2 IMPLANT
BLADE SURG SZ10 CARB STEEL (BLADE) ×2 IMPLANT
CHLORAPREP W/TINT 26 (MISCELLANEOUS) ×2 IMPLANT
CLSR STERI-STRIP ANTIMIC 1/2X4 (GAUZE/BANDAGES/DRESSINGS) ×2 IMPLANT
COVER PERINEAL POST (MISCELLANEOUS) ×2 IMPLANT
COVER SURGICAL LIGHT HANDLE (MISCELLANEOUS) ×2 IMPLANT
DRAPE IMP U-DRAPE 54X76 (DRAPES) ×3 IMPLANT
DRAPE STERI IOBAN 125X83 (DRAPES) ×2 IMPLANT
DRAPE U-SHAPE 47X51 STRL (DRAPES) ×4 IMPLANT
DRSG MEPILEX BORDER 4X8 (GAUZE/BANDAGES/DRESSINGS) ×2 IMPLANT
DRSG MEPILEX POST OP 4X8 (GAUZE/BANDAGES/DRESSINGS) ×1 IMPLANT
ELECT REM PT RETURN 15FT ADLT (MISCELLANEOUS) ×2 IMPLANT
GLOVE BIO SURGEON STRL SZ7.5 (GLOVE) ×2 IMPLANT
GLOVE BIOGEL PI IND STRL 7.5 (GLOVE) ×1 IMPLANT
GLOVE BIOGEL PI IND STRL 8 (GLOVE) ×1 IMPLANT
GLOVE BIOGEL PI INDICATOR 7.5 (GLOVE) ×1
GLOVE BIOGEL PI INDICATOR 8 (GLOVE) ×1
GLOVE SURG SYN 7.5  E (GLOVE) ×2
GLOVE SURG SYN 7.5 E (GLOVE) ×1 IMPLANT
GLOVE SURG SYN 7.5 PF PI (GLOVE) ×1 IMPLANT
GOWN SPEC L4 XLG W/TWL (GOWN DISPOSABLE) ×2 IMPLANT
GOWN STRL REUS W/ TWL LRG LVL3 (GOWN DISPOSABLE) ×1 IMPLANT
GOWN STRL REUS W/TWL LRG LVL3 (GOWN DISPOSABLE) ×2
HEAD BIOLOX HIP 36/-2.5 (Joint) IMPLANT
HIP BIOLOX HD 36/-2.5 (Joint) ×2 IMPLANT
HOLDER FOLEY CATH W/STRAP (MISCELLANEOUS) IMPLANT
INSERT 0 DEGREE 36 (Miscellaneous) ×1 IMPLANT
KIT TURNOVER KIT A (KITS) IMPLANT
MANIFOLD NEPTUNE II (INSTRUMENTS) ×2 IMPLANT
NS IRRIG 1000ML POUR BTL (IV SOLUTION) ×2 IMPLANT
PACK ANTERIOR HIP CUSTOM (KITS) ×2 IMPLANT
PROTECTOR NERVE ULNAR (MISCELLANEOUS) ×2 IMPLANT
SCREW HEX LP 6.5X20 (Screw) ×1 IMPLANT
SHELL TRIDENT II CLUST 50 (Shell) ×1 IMPLANT
SPIKE FLUID TRANSFER (MISCELLANEOUS) ×2 IMPLANT
STEM HIP 127 DEG (Stem) ×1 IMPLANT
SUT MNCRL AB 3-0 PS2 18 (SUTURE) ×2 IMPLANT
SUT VIC AB 0 CT1 36 (SUTURE) ×2 IMPLANT
SUT VIC AB 1 CT1 36 (SUTURE) ×2 IMPLANT
SUT VIC AB 2-0 CT1 27 (SUTURE) ×4
SUT VIC AB 2-0 CT1 TAPERPNT 27 (SUTURE) ×2 IMPLANT
TRAY FOLEY MTR SLVR 14FR STAT (SET/KITS/TRAYS/PACK) ×1 IMPLANT
TRAY FOLEY MTR SLVR 16FR STAT (SET/KITS/TRAYS/PACK) IMPLANT
TUBE SUCTION HIGH CAP CLEAR NV (SUCTIONS) ×2 IMPLANT
WATER STERILE IRR 1000ML POUR (IV SOLUTION) ×4 IMPLANT

## 2022-05-18 NOTE — Discharge Instructions (Signed)

## 2022-05-18 NOTE — Anesthesia Postprocedure Evaluation (Signed)
Anesthesia Post Note  Patient: Alison Brooks  Procedure(s) Performed: TOTAL HIP ARTHROPLASTY ANTERIOR APPROACH (Right: Hip)     Patient location during evaluation: PACU Anesthesia Type: Spinal Level of consciousness: oriented and awake and alert Pain management: pain level controlled Vital Signs Assessment: post-procedure vital signs reviewed and stable Respiratory status: spontaneous breathing, respiratory function stable and nonlabored ventilation Cardiovascular status: blood pressure returned to baseline and stable Postop Assessment: no headache, no backache, no apparent nausea or vomiting, spinal receding and patient able to bend at knees Anesthetic complications: no   No notable events documented.  Last Vitals:  Vitals:   05/18/22 1215 05/18/22 1230  BP: (!) 109/58 (!) 127/56  Pulse: 64 61  Resp: (!) 21 (!) 22  Temp:    SpO2: 96% 95%    Last Pain:  Vitals:   05/18/22 1235  TempSrc:   PainSc: 5                  Nathanie Ottley A.

## 2022-05-18 NOTE — Evaluation (Signed)
Physical Therapy Evaluation Patient Details Name: Alison Brooks MRN: [id number] DOB: [birth year] Today's Date: 05/18/2022  History of Present Illness  66 yo female s/p R THA-DA 7/25. Hx of chronic pain, back sg, gout, spinal cord stimulator  Clinical Impression  On eval POD 0, pt was Min A for mobility. She walked ~75 feet with a RW. Moderate pian with activity-made RN aware pt would like meds for pain. Anticipate pt will progress well. Plan is for potential d/c home if pt continues to perform well and she is cleared by surgeon/PA.      Recommendations for follow up therapy are one component of a multi-disciplinary discharge planning process, led by the attending physician.  Recommendations may be updated based on patient status, additional functional criteria and insurance authorization.  Follow Up Recommendations Follow physician's recommendations for discharge plan and follow up therapies      Assistance Recommended at Discharge PRN  Patient can return home with the following  A little help with walking and/or transfers;A little help with bathing/dressing/bathroom;Assistance with cooking/housework;Assist for transportation;Help with stairs or ramp for entrance    Equipment Recommendations Rolling walker (2 wheels)  Recommendations for Other Services       Functional Status Assessment Patient has had a recent decline in their functional status and demonstrates the ability to make significant improvements in function in a reasonable and predictable amount of time.     Precautions / Restrictions Precautions Precautions: Fall Restrictions Weight Bearing Restrictions: No RLE Weight Bearing: Weight bearing as tolerated      Mobility  Bed Mobility Overal bed mobility: Needs Assistance Bed Mobility: Supine to Sit     Supine to sit: Min assist     General bed mobility comments: Assist for R LE. Increased time.    Transfers Overall transfer level: Needs  assistance Equipment used: Rolling walker (2 wheels) Transfers: Sit to/from Stand Sit to Stand: Min guard           General transfer comment: Min guard for safety. Cues for safety, hand placement    Ambulation/Gait Ambulation/Gait assistance: Min guard Gait Distance (Feet): 75 Feet Assistive device: Rolling walker (2 wheels) Gait Pattern/deviations: Step-through pattern, Decreased stride length       General Gait Details: Min guard for safety. Pt denied dizziness.  Stairs            Wheelchair Mobility    Modified Rankin (Stroke Patients Only)       Balance Overall balance assessment: Needs assistance         Standing balance support: Bilateral upper extremity supported, During functional activity, Reliant on assistive device for balance Standing balance-Leahy Scale: Good                               Pertinent Vitals/Pain Pain Assessment Pain Assessment: 0-10 Pain Score: 7  Pain Location: R hip/thigh Pain Descriptors / Indicators: Discomfort, Sore Pain Intervention(s): Limited activity within patient's tolerance, Monitored during session, Repositioned    Home Living Family/patient expects to be discharged to:: Private residence Living Arrangements: Spouse/significant other Available Help at Discharge: Available PRN/intermittently;Family Type of Home: House Home Access: Stairs to enter   Entrance Stairs-Number of Steps: 2 Alternate Level Stairs-Number of Steps: 1 flight Home Layout: Able to live on main level with bedroom/bathroom;Two level Home Equipment: BSC/3in1;Cane - single point      Prior Function Prior Level of Function : Independent/Modified Independent  Hand Dominance        Extremity/Trunk Assessment   Upper Extremity Assessment Upper Extremity Assessment: Overall WFL for tasks assessed    Lower Extremity Assessment Lower Extremity Assessment: Generalized weakness    Cervical / Trunk  Assessment Cervical / Trunk Assessment: Normal  Communication   Communication: No difficulties  Cognition Arousal/Alertness: Awake/alert Behavior During Therapy: WFL for tasks assessed/performed Overall Cognitive Status: Within Functional Limits for tasks assessed                                          General Comments      Exercises Total Joint Exercises Ankle Circles/Pumps: AROM, Both, 10 reps Quad Sets: AROM, Both, 10 reps   Assessment/Plan    PT Assessment Patient needs continued PT services  PT Problem List Decreased strength;Decreased balance;Decreased range of motion;Decreased activity tolerance;Decreased mobility;Decreased knowledge of use of DME       PT Treatment Interventions DME instruction;Gait training;Functional mobility training;Therapeutic activities;Balance training;Patient/family education;Therapeutic exercise    PT Goals (Current goals can be found in the Care Plan section)  Acute Rehab PT Goals Patient Stated Goal: less pain PT Goal Formulation: With patient Time For Goal Achievement: 06/01/22 Potential to Achieve Goals: Good    Frequency 7X/week     Co-evaluation               AM-PAC PT "6 Clicks" Mobility  Outcome Measure Help needed turning from your back to your side while in a flat bed without using bedrails?: A Little Help needed moving from lying on your back to sitting on the side of a flat bed without using bedrails?: A Little Help needed moving to and from a bed to a chair (including a wheelchair)?: A Little Help needed standing up from a chair using your arms (e.g., wheelchair or bedside chair)?: A Little Help needed to walk in hospital room?: A Little Help needed climbing 3-5 steps with a railing? : A Little 6 Click Score: 18    End of Session Equipment Utilized During Treatment: Gait belt Activity Tolerance: Patient tolerated treatment well Patient left: in chair;with call bell/phone within reach Nurse  Communication: Patient requests pain meds      Time: 1610-9604 PT Time Calculation (min) (ACUTE ONLY): 23 min   Charges:   PT Evaluation $PT Eval Low Complexity: 1 Low PT Treatments $Gait Training: 8-22 mins          Doreatha Massed, PT Acute Rehabilitation  Office: [phone] Pager: [phone]

## 2022-05-18 NOTE — Anesthesia Procedure Notes (Addendum)
Spinal  Patient location during procedure: OR Start time: 05/18/2022 9:59 AM End time: 05/18/2022 10:01 AM Reason for block: surgical anesthesia Staffing Performed: anesthesiologist  Anesthesiologist: Josephine Igo, MD Performed by: Josephine Igo, MD Authorized by: Josephine Igo, MD   Preanesthetic Checklist Completed: patient identified, IV checked, site marked, risks and benefits discussed, surgical consent, monitors and equipment checked, pre-op evaluation and timeout performed Spinal Block Patient position: sitting Prep: DuraPrep and site prepped and draped Patient monitoring: heart rate, cardiac monitor, continuous pulse ox and blood pressure Approach: midline Location: L3-4 Injection technique: single-shot Needle Needle type: Pencan  Needle gauge: 24 G Needle length: 9 cm Needle insertion depth: 5 cm Assessment Sensory level: T4 Events: CSF return Additional Notes Patient tolerated procedure well. Adequate sensory level.

## 2022-05-18 NOTE — Interval H&P Note (Signed)
History and Physical Interval Note:  05/18/2022 7:08 AM  Alison Brooks  has presented today for surgery, with the diagnosis of OA RIGHT HIP.  The various methods of treatment have been discussed with the patient and family. After consideration of risks, benefits and other options for treatment, the patient has consented to  Procedure(s): TOTAL HIP ARTHROPLASTY ANTERIOR APPROACH (Right) as a surgical intervention.  The patient's history has been reviewed, patient examined, no change in status, stable for surgery.  I have reviewed the patient's chart and labs.  Questions were answered to the patient's satisfaction.     Renette Butters

## 2022-05-18 NOTE — Anesthesia Preprocedure Evaluation (Addendum)
Anesthesia Evaluation  Patient identified by MRN, date of birth, ID band Patient awake    Reviewed: Allergy & Precautions, NPO status , Patient's Chart, lab work & pertinent test results, reviewed documented beta blocker date and time   History of Anesthesia Complications Negative for: history of anesthetic complications  Airway Mallampati: II  TM Distance: >3 FB Neck ROM: Full    Dental no notable dental hx. (+) Teeth Intact, Dental Advisory Given, Caps   Pulmonary shortness of breath and with exertion, COPD,  COPD inhaler, former smoker,  Hx/o Long Covid Hx/o multiple pulmonary nodules being monitored by pulmonology   Pulmonary exam normal breath sounds clear to auscultation       Cardiovascular hypertension, Pt. 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  Reproductive/Obstetrics negative OB ROS                            Anesthesia Physical  Anesthesia Plan  ASA: 2  Anesthesia Plan: Spinal   Post-op Pain Management: Regional block*, Precedex and Dilaudid IV   Induction:  Intravenous  PONV Risk Score and Plan: 3 and Treatment may vary due to age or medical condition, Dexamethasone, Midazolam, Propofol infusion and Ondansetron  Airway Management Planned: Natural Airway and Simple Face Mask  Additional Equipment: None  Intra-op Plan:   Post-operative Plan:   Informed Consent: I have reviewed the patients History and Physical, chart, labs and discussed the procedure including the risks, benefits and alternatives for the proposed anesthesia with the patient or authorized representative who has indicated his/her understanding and acceptance.     Dental advisory given  Plan Discussed with: CRNA  Anesthesia Plan Comments:         Anesthesia Quick Evaluation

## 2022-05-18 NOTE — Interval H&P Note (Signed)
History and Physical Interval Note:  05/18/2022 9:06 AM  Alison Brooks  has presented today for surgery, with the diagnosis of OA RIGHT HIP.  The various methods of treatment have been discussed with the patient and family. After consideration of risks, benefits and other options for treatment, the patient has consented to  Procedure(s): TOTAL HIP ARTHROPLASTY ANTERIOR APPROACH (Right) as a surgical intervention.  The patient's history has been reviewed, patient examined, no change in status, stable for surgery.  I have reviewed the patient's chart and labs.  Questions were answered to the patient's satisfaction.     Renette Butters

## 2022-05-18 NOTE — Transfer of Care (Signed)
Immediate Anesthesia Transfer of Care Note  Patient: Alison Brooks  Procedure(s) Performed: TOTAL HIP ARTHROPLASTY ANTERIOR APPROACH (Right: Hip)  Patient Location: PACU  Anesthesia Type:Spinal  Level of Consciousness: awake  Airway & Oxygen Therapy: Patient Spontanous Breathing  Post-op Assessment: Report given to RN  Post vital signs: stable  Last Vitals:  Vitals Value Taken Time  BP 101/54 05/18/22 1151  Temp    Pulse 59 05/18/22 1153  Resp 13 05/18/22 1153  SpO2 97 % 05/18/22 1153  Vitals shown include unvalidated device data.  Last Pain:  Vitals:   05/18/22 0753  TempSrc:   PainSc: 5       Patients Stated Pain Goal: 3 (61/48/30 7354)  Complications: No notable events documented.

## 2022-05-19 ENCOUNTER — Encounter (HOSPITAL_COMMUNITY): Payer: Self-pay | Admitting: Orthopedic Surgery

## 2022-05-19 DIAGNOSIS — I10 Essential (primary) hypertension: Secondary | ICD-10-CM | POA: Diagnosis not present

## 2022-05-19 DIAGNOSIS — M1611 Unilateral primary osteoarthritis, right hip: Secondary | ICD-10-CM | POA: Diagnosis not present

## 2022-05-19 DIAGNOSIS — J449 Chronic obstructive pulmonary disease, unspecified: Secondary | ICD-10-CM | POA: Diagnosis not present

## 2022-05-19 DIAGNOSIS — Z8616 Personal history of COVID-19: Secondary | ICD-10-CM | POA: Diagnosis not present

## 2022-05-19 DIAGNOSIS — Z79899 Other long term (current) drug therapy: Secondary | ICD-10-CM | POA: Diagnosis not present

## 2022-05-19 DIAGNOSIS — E039 Hypothyroidism, unspecified: Secondary | ICD-10-CM | POA: Diagnosis not present

## 2022-05-19 DIAGNOSIS — Z96641 Presence of right artificial hip joint: Secondary | ICD-10-CM | POA: Diagnosis not present

## 2022-05-19 DIAGNOSIS — Z91040 Latex allergy status: Secondary | ICD-10-CM | POA: Diagnosis not present

## 2022-05-19 DIAGNOSIS — Z85828 Personal history of other malignant neoplasm of skin: Secondary | ICD-10-CM | POA: Diagnosis not present

## 2022-05-19 DIAGNOSIS — Z87891 Personal history of nicotine dependence: Secondary | ICD-10-CM | POA: Diagnosis not present

## 2022-05-19 MED ORDER — PROMETHAZINE HCL 12.5 MG PO TABS: 12.5000 mg | ORAL_TABLET | Freq: Four times a day (QID) | ORAL | 0 refills | Status: DC | PRN

## 2022-05-19 MED ORDER — ACETAMINOPHEN 500 MG PO TABS: 1000.0000 mg | ORAL_TABLET | Freq: Four times a day (QID) | ORAL | 0 refills | Status: AC | PRN

## 2022-05-19 MED ORDER — MELOXICAM 15 MG PO TABS: 15.0000 mg | ORAL_TABLET | Freq: Every day | ORAL | 0 refills | Status: DC | PRN

## 2022-05-19 MED ORDER — METHOCARBAMOL 750 MG PO TABS: 750.0000 mg | ORAL_TABLET | Freq: Three times a day (TID) | ORAL | 0 refills | Status: DC | PRN

## 2022-05-19 MED ORDER — OXYCODONE HCL 10 MG PO TABS: 5.0000 mg | ORAL_TABLET | Freq: Three times a day (TID) | ORAL | 0 refills | Status: DC | PRN

## 2022-05-19 MED ORDER — ASPIRIN 81 MG PO TBEC: 81.0000 mg | DELAYED_RELEASE_TABLET | Freq: Two times a day (BID) | ORAL | 0 refills | Status: DC

## 2022-05-19 NOTE — Progress Notes (Signed)
Physical Therapy Treatment Patient Details Name: Alison Brooks MRN: [id number] DOB: Jul 28, 1956 Today's Date: 05/19/2022   History of Present Illness 66 yo female s/p R THA-DA 7/25. Hx of chronic pain, back sg, gout, spinal cord stimulator    PT Comments    Progressing with mobility. All education completed.    Recommendations for follow up therapy are one component of a multi-disciplinary discharge planning process, led by the attending physician.  Recommendations may be updated based on patient status, additional functional criteria and insurance authorization.  Follow Up Recommendations  Follow physician's recommendations for discharge plan and follow up therapies     Assistance Recommended at Discharge PRN  Patient can return home with the following A little help with walking and/or transfers;A little help with bathing/dressing/bathroom;Assistance with cooking/housework;Assist for transportation;Help with stairs or ramp for entrance   Equipment Recommendations  Rolling walker (2 wheels)    Recommendations for Other Services       Precautions / Restrictions Precautions Precautions: Fall Restrictions Weight Bearing Restrictions: No RLE Weight Bearing: Weight bearing as tolerated     Mobility  Bed Mobility               General bed mobility comments: oob in recliner    Transfers Overall transfer level: Needs assistance Equipment used: Rolling walker (2 wheels) Transfers: Sit to/from Stand Sit to Stand: Supervision           General transfer comment: Supv for safety. Cues for safety, hand placement    Ambulation/Gait Ambulation/Gait assistance: Min guard Gait Distance (Feet): 150 Feet Assistive device: Rolling walker (2 wheels) Gait Pattern/deviations: Step-through pattern, Decreased stride length       General Gait Details: Min guard for safety. Pt denied dizziness. Tolerated distance well.   Stairs Stairs: Yes Stairs assistance: Min  guard Stair Management: Step to pattern, With walker Number of Stairs: 1 General stair comments: Cues safety, seqeunce, technique. Min guard.   Wheelchair Mobility    Modified Rankin (Stroke Patients Only)       Balance Overall balance assessment: Needs assistance         Standing balance support: Bilateral upper extremity supported, During functional activity, Reliant on assistive device for balance Standing balance-Leahy Scale: Fair                              Cognition Arousal/Alertness: Awake/alert Behavior During Therapy: WFL for tasks assessed/performed Overall Cognitive Status: Within Functional Limits for tasks assessed                                          Exercises     General Comments        Pertinent Vitals/Pain Pain Assessment Pain Assessment: 0-10 Pain Score: 7  Pain Location: R hip/thigh Pain Descriptors / Indicators: Discomfort, Sore Pain Intervention(s): Monitored during session    Home Living                          Prior Function            PT Goals (current goals can now be found in the care plan section) Progress towards PT goals: Progressing toward goals    Frequency    7X/week      PT Plan Current plan remains appropriate    Co-evaluation  AM-PAC PT "6 Clicks" Mobility   Outcome Measure  Help needed turning from your back to your side while in a flat bed without using bedrails?: A Little Help needed moving from lying on your back to sitting on the side of a flat bed without using bedrails?: A Little Help needed moving to and from a bed to a chair (including a wheelchair)?: A Little Help needed standing up from a chair using your arms (e.g., wheelchair or bedside chair)?: A Little Help needed to walk in hospital room?: A Little Help needed climbing 3-5 steps with a railing? : A Little 6 Click Score: 18    End of Session Equipment Utilized During Treatment:  Gait belt Activity Tolerance: Patient tolerated treatment well Patient left: in chair;with call bell/phone within reach   PT Visit Diagnosis: Pain;Other abnormalities of gait and mobility (R26.89) Pain - Right/Left: Right Pain - part of body: Hip     Time: 1325-1340 PT Time Calculation (min) (ACUTE ONLY): 15 min  Charges:  $Gait Training: 8-22 mins                        Doreatha Massed, PT Acute Rehabilitation  Office: [phone] Pager: [phone]

## 2022-05-19 NOTE — Op Note (Signed)
05/18/2022  7:17 AM  PATIENT:  Alison Brooks   MRN: [id number]  PRE-OPERATIVE DIAGNOSIS:  OA RIGHT HIP  POST-OPERATIVE DIAGNOSIS:  OA RIGHT HIP  PROCEDURE:  Procedure(s): TOTAL HIP ARTHROPLASTY ANTERIOR APPROACH  PREOPERATIVE INDICATIONS:    Alison Brooks is an 66 y.o. female who has a diagnosis of S/P total right hip arthroplasty and elected for surgical management after failing conservative treatment.  The risks benefits and alternatives were discussed with the patient including but not limited to the risks of nonoperative treatment, versus surgical intervention including infection, bleeding, nerve injury, periprosthetic fracture, the need for revision surgery, dislocation, leg length discrepancy, blood clots, cardiopulmonary complications, morbidity, mortality, among others, and they were willing to proceed.     OPERATIVE REPORT     SURGEON:   Renette Butters, MD    ASSISTANT:  Aggie Moats, PA-C, he was present and scrubbed throughout the case, critical for completion in a timely fashion, and for retraction, instrumentation, and closure.     ANESTHESIA:  General    COMPLICATIONS:  None.     COMPONENTS:  [employer] acolade fit femur size 5 with a 36 mm -2.5 head ball and an acetabular shell size 50 with a  polyethylene liner    PROCEDURE IN DETAIL:   The patient was met in the holding area and  identified.  The appropriate hip was identified and marked at the operative site.  The patient was then transported to the OR  and  placed under anesthesia per that record.  At that point, the patient was  placed in the supine position and  secured to the operating room table and all bony prominences padded. He received pre-operative antibiotics    The operative lower extremity was prepped from the iliac crest to the distal leg.  Sterile draping was performed.  Time out was performed prior to incision.      Skin incision was made just 2 cm lateral to the ASIS  extending in line with  the tensor fascia lata. Electrocautery was used to control all bleeders. I dissected down sharply to the fascia of the tensor fascia lata was confirmed that the muscle fibers beneath were running posteriorly. I then incised the fascia over the superficial tensor fascia lata in line with the incision. The fascia was elevated off the anterior aspect of the muscle the muscle was retracted posteriorly and protected throughout the case. I then used electrocautery to incise the tensor fascia lata fascia control and all bleeders. Immediately visible was the fat over top of the anterior neck and capsule.  I removed the anterior fat from the capsule and elevated the rectus muscle off of the anterior capsule. I then removed a large time of capsule. The retractors were then placed over the anterior acetabulum as well as around the superior and inferior neck.  I then made a femoral neck cut. Then used the power corkscrew to remove the femoral head from the acetabulum and thoroughly irrigated the acetabulum. I sized the femoral head.    I then exposed the deep acetabulum, cleared out any tissue including the ligamentum teres.   After adequate visualization, I excised the labrum, and then sequentially reamed.  I then impacted the acetabular implant into place using fluoroscopy for guidance.  Appropriate version and inclination was confirmed clinically matching their bony anatomy, and with fluoroscopy.  I placed a 20 mm screw in the posterior/superio position with an excellent bite.    I then placed the polyethylene liner  in place  I then adducted the leg and released the external rotators from the posterior femur allowing it to be easily delivered up lateral and anterior to the acetabulum for preparation of the femoral canal.    I then prepared the proximal femur using the cookie-cutter and then sequentially reamed and broached.  A trial broach, neck, and head was utilized, and I reduced the hip and used  floroscopy to assess the neck length and femoral implant.  I then impacted the femoral prosthesis into place into the appropriate version. The hip was then reduced and fluoroscopy confirmed appropriate position. Leg lengths were restored.  I then irrigated the hip copiously again with, and repaired the fascia with Vicryl, followed by monocryl for the subcutaneous tissue, Monocryl for the skin, Steri-Strips and sterile gauze. The patient was then awakened and returned to PACU in stable and satisfactory condition. There were no complications.  POST OPERATIVE PLAN: WBAT, DVT px: SCD's/TED, ambulation and chemical dvt px  Alison Lynch, MD Orthopedic Surgeon [phone]

## 2022-05-19 NOTE — Progress Notes (Signed)
Physical Therapy Treatment Patient Details Name: Alison Brooks MRN: [id number] DOB: 05/27/1956 Today's Date: 05/19/2022   History of Present Illness 66 yo female s/p R THA-DA 7/25. Hx of chronic pain, back sg, gout, spinal cord stimulator    PT Comments    Progressing with mobility. Pt reports she had a rough night and that she is still experiencing significant pain. Will plan to have one more session prior to d/c home later today if okay with surgeon/PA.   Recommendations for follow up therapy are one component of a multi-disciplinary discharge planning process, led by the attending physician.  Recommendations may be updated based on patient status, additional functional criteria and insurance authorization.  Follow Up Recommendations  Follow physician's recommendations for discharge plan and follow up therapies     Assistance Recommended at Discharge PRN  Patient can return home with the following A little help with walking and/or transfers;A little help with bathing/dressing/bathroom;Assistance with cooking/housework;Assist for transportation;Help with stairs or ramp for entrance   Equipment Recommendations  Rolling walker (2 wheels)    Recommendations for Other Services       Precautions / Restrictions Precautions Precautions: Fall Restrictions Weight Bearing Restrictions: No RLE Weight Bearing: Weight bearing as tolerated     Mobility  Bed Mobility               General bed mobility comments: oob in recliner    Transfers Overall transfer level: Needs assistance Equipment used: Rolling walker (2 wheels) Transfers: Sit to/from Stand Sit to Stand: Min guard           General transfer comment: Min guard for safety. Cues for safety, hand placement    Ambulation/Gait Ambulation/Gait assistance: Min guard Gait Distance (Feet): 125 Feet Assistive device: Rolling walker (2 wheels) Gait Pattern/deviations: Step-through pattern, Decreased stride length        General Gait Details: Min guard for safety. Pt denied dizziness.   Stairs             Wheelchair Mobility    Modified Rankin (Stroke Patients Only)       Balance Overall balance assessment: Needs assistance         Standing balance support: Bilateral upper extremity supported, During functional activity, Reliant on assistive device for balance Standing balance-Leahy Scale: Fair                              Cognition Arousal/Alertness: Awake/alert Behavior During Therapy: WFL for tasks assessed/performed Overall Cognitive Status: Within Functional Limits for tasks assessed                                          Exercises Total Joint Exercises Ankle Circles/Pumps: AROM, Both, 10 reps Quad Sets: AROM, Both, 10 reps Heel Slides: AAROM, Right, 10 reps Hip ABduction/ADduction: AAROM, Right, 10 reps    General Comments        Pertinent Vitals/Pain Pain Assessment Pain Assessment: 0-10 Pain Score: 7  Pain Location: R hip/thigh Pain Descriptors / Indicators: Discomfort, Sore Pain Intervention(s): Monitored during session, Repositioned    Home Living                          Prior Function            PT Goals (current goals can now be  found in the care plan section) Progress towards PT goals: Progressing toward goals    Frequency    7X/week      PT Plan Current plan remains appropriate    Co-evaluation              AM-PAC PT "6 Clicks" Mobility   Outcome Measure  Help needed turning from your back to your side while in a flat bed without using bedrails?: A Little Help needed moving from lying on your back to sitting on the side of a flat bed without using bedrails?: A Little Help needed moving to and from a bed to a chair (including a wheelchair)?: A Little Help needed standing up from a chair using your arms (e.g., wheelchair or bedside chair)?: A Little Help needed to walk in hospital room?:  A Little Help needed climbing 3-5 steps with a railing? : A Little 6 Click Score: 18    End of Session Equipment Utilized During Treatment: Gait belt Activity Tolerance: Patient tolerated treatment well Patient left: in chair;with call bell/phone within reach   PT Visit Diagnosis: Pain;Other abnormalities of gait and mobility (R26.89) Pain - Right/Left: Right Pain - part of body: Hip     Time: 3612-2449 PT Time Calculation (min) (ACUTE ONLY): 14 min  Charges:  $Gait Training: 8-22 mins                         Doreatha Massed, PT Acute Rehabilitation  Office: [phone] Pager: [phone]

## 2022-05-19 NOTE — Progress Notes (Signed)
    Subjective: Patient reports pain as marked. Oxycodone making her feel wired. Tolerating diet.  Urinating.   No CP, SOB.  Has mobilized OOB with PT.   Objective:   VITALS:   Vitals:   05/18/22 2126 05/19/22 0230 05/19/22 0613 05/19/22 0948  BP: 114/74 119/67 105/64 110/87  Pulse: 69 70 61 67  Resp: '17 17 17 18  '$ Temp: 97.6 F (36.4 C) 98.2 F (36.8 C) 98.2 F (36.8 C) 99 F (37.2 C)  TempSrc: Oral Oral  Oral  SpO2: 97% 93% 100% 96%  Weight:      Height:          Latest Ref Rng & Units 05/06/2022    9:37 AM 03/16/2022    2:32 PM 06/15/2021    4:00 AM  CBC  WBC 4.0 - 10.5 K/uL 7.5  9.2  9.0   Hemoglobin 12.0 - 15.0 g/dL 14.4  14.6  12.2   Hematocrit 36.0 - 46.0 % 44.3  43.6  38.5   Platelets 150 - 400 K/uL 206  227.0  174       Latest Ref Rng & Units 05/06/2022    9:37 AM 03/16/2022    2:32 PM 06/13/2021    4:30 AM  BMP  Glucose 70 - 99 mg/dL 92  112  112   BUN 8 - 23 mg/dL '19  12  14   '$ Creatinine 0.44 - 1.00 mg/dL 0.79  0.88  0.84   Sodium 135 - 145 mmol/L 139  140  137   Potassium 3.5 - 5.1 mmol/L 4.0  3.8  3.6   Chloride 98 - 111 mmol/L 112  108  109   CO2 22 - 32 mmol/L '22  25  22   '$ Calcium 8.9 - 10.3 mg/dL 8.9  9.2  8.6    Intake/Output      07/25 0701 07/26 0700 07/26 0701 07/27 0700   P.O.  240   I.V. (mL/kg) 1000 (14.7)    IV Piggyback 350    Total Intake(mL/kg) 1350 (19.9) 240 (3.5)   Urine (mL/kg/hr) 2000 (1.2) 800 (2)   Blood 250    Total Output 2250 800   Net -900 -560        Urine Occurrence  1 x      Physical Exam: General: NAD.  Sitting up in bedside chair. Calm, comfortable Resp: No increased wob Cardio: regular rate and rhythm ABD soft Neurologically intact MSK Neurovascularly intact Sensation intact distally Intact pulses distally Dorsiflexion/Plantar flexion intact Incision: dressing C/D/I   Assessment: 1 Day Post-Op  S/P Procedure(s) (LRB): TOTAL HIP ARTHROPLASTY ANTERIOR APPROACH (Right) by Dr. Ernesta Amble. Murphy on  05/18/22  Principal Problem:   S/P total right hip arthroplasty   Plan:  Advance diet Up with therapy Incentive Spirometry Elevate and Apply ice  Weightbearing: WBAT RLE Insicional and dressing care: Dressings left intact until follow-up and Reinforce dressings as needed Orthopedic device(s): None Showering: Keep dressing dry VTE prophylaxis: Aspirin '81mg'$  BID  x 30 days , SCDs, ambulation Pain control: continue current regimen Follow - up plan: 2 weeks Contact information:  Edmonia Lynch MD, Aggie Moats PA-C  Dispo: Home today     Britt Bottom, [place] Office [phone] 05/19/2022, 12:47 PM

## 2022-05-19 NOTE — Discharge Summary (Signed)
Physician Discharge Summary  Patient ID: Alison Brooks MRN: [id number] DOB/AGE: 68/06/1956 68 y.o.  Admit date: 05/18/2022 Discharge date: 05/19/2022  Admission Diagnoses: right hip OA  Discharge Diagnoses:  Principal Problem:   S/P total right hip arthroplasty   Discharged Condition: fair  Hospital Course: Patient underwent a right THA by Dr. Percell Miller on 3/66/29 without complications. She spent the night in observation for pain control and mobilization. She has passed her PT evaluation. She is ready for discharge home with OPPT already set up.   Consults: None  Significant Diagnostic Studies: n/a  Treatments: IV hydration, antibiotics: Ancef, analgesia: acetaminophen, Dilaudid, Tramadol, and Oxycodone, anticoagulation: ASA, therapies: PT and OT, and surgery: right THA  Discharge Exam: Blood pressure 121/61, pulse 65, temperature 98.6 F (37 C), resp. rate 20, height 5' 1.81" (1.57 m), weight 68 kg, SpO2 94 %. General appearance: alert, cooperative, and no distress Head: Normocephalic, without obvious abnormality, atraumatic Resp: clear to auscultation bilaterally Cardio: regular rate and rhythm, S1, S2 normal, no murmur, click, rub or gallop GI: soft, non-tender; bowel sounds normal; no masses,  no organomegaly Extremities: extremities normal, atraumatic, no cyanosis or edema Pulses:  L brachial 2+ R brachial 2+  L radial 2+ R radial 2+  L inguinal 2+ R inguinal 2+  L popliteal 2+ R popliteal 2+  L posterior tibial 2+ R posterior tibial 2+  L dorsalis pedis 2+ R dorsalis pedis 2+   Neurologic: Grossly normal Incision/Wound: c/d/i  Disposition: Discharge disposition: 01-Home or Self Care       Discharge Instructions     Call MD / Call 911   Complete by: As directed    If you experience chest pain or shortness of breath, CALL 911 and be transported to the hospital emergency room.  If you develope a fever above 101 F, pus (white drainage) or increased drainage or  redness at the wound, or calf pain, call your surgeon's office.   Diet - low sodium heart healthy   Complete by: As directed    Discharge instructions   Complete by: As directed    You may bear weight as tolerated. Keep your dressing on and dry until follow up. Take medicine to prevent blood clots as directed. Take pain medicine as needed with the goal of transitioning to over the counter medicines.    INSTRUCTIONS AFTER JOINT REPLACEMENT   Remove items at home which could result in a fall. This includes throw rugs or furniture in walking pathways ICE to the affected joint every three hours while awake for 30 minutes at a time, for at least the first 3-5 days, and then as needed for pain and swelling.  Continue to use ice for pain and swelling. You may notice swelling that will progress down to the foot and ankle.  This is normal after surgery.  Elevate your leg when you are not up walking on it.   Continue to use the breathing machine you got in the hospital (incentive spirometer) which will help keep your temperature down.  It is common for your temperature to cycle up and down following surgery, especially at night when you are not up moving around and exerting yourself.  The breathing machine keeps your lungs expanded and your temperature down.   DIET:  As you were doing prior to hospitalization, we recommend a well-balanced diet.  DRESSING / WOUND CARE / SHOWERING  You may shower 3 days after surgery, but keep the wounds dry during showering.  You may  use an occlusive plastic wrap (Press'n Seal for example) with blue painter's tape at edges, NO SOAKING/SUBMERGING IN THE BATHTUB.  If the bandage gets wet, call the office.   ACTIVITY  Increase activity slowly as tolerated, but follow the weight bearing instructions below.   No driving for 6 weeks or until further direction given by your physician.  You cannot drive while taking narcotics.  No lifting or carrying greater than 10 lbs.  until further directed by your surgeon. Avoid periods of inactivity such as sitting longer than an hour when not asleep. This helps prevent blood clots.  You may return to work once you are authorized by your doctor.    WEIGHT BEARING   Weight bearing as tolerated with assist device (walker, cane, etc) as directed, use it as long as suggested by your surgeon or therapist, typically at least 4-6 weeks.   EXERCISES  Results after joint replacement surgery are often greatly improved when you follow the exercise, range of motion and muscle strengthening exercises prescribed by your doctor. Safety measures are also important to protect the joint from further injury. Any time any of these exercises cause you to have increased pain or swelling, decrease what you are doing until you are comfortable again and then slowly increase them. If you have problems or questions, call your caregiver or physical therapist for advice.   Rehabilitation is important following a joint replacement. After just a few days of immobilization, the muscles of the leg can become weakened and shrink (atrophy).  These exercises are designed to build up the tone and strength of the thigh and leg muscles and to improve motion. Often times heat used for twenty to thirty minutes before working out will loosen up your tissues and help with improving the range of motion but do not use heat for the first two weeks following surgery (sometimes heat can increase post-operative swelling).   These exercises can be done on a training (exercise) mat, on the floor, on a table or on a bed. Use whatever works the best and is most comfortable for you.    Use music or television while you are exercising so that the exercises are a pleasant break in your day. This will make your life better with the exercises acting as a break in your routine that you can look forward to.   Perform all exercises about fifteen times, three times per day or as directed.   You should exercise both the operative leg and the other leg as well.  Exercises include:   Quad Sets - Tighten up the muscle on the front of the thigh (Quad) and hold for 5-10 seconds.   Straight Leg Raises - With your knee straight (if you were given a brace, keep it on), lift the leg to 60 degrees, hold for 3 seconds, and slowly lower the leg.  Perform this exercise against resistance later as your leg gets stronger.  Leg Slides: Lying on your back, slowly slide your foot toward your buttocks, bending your knee up off the floor (only go as far as is comfortable). Then slowly slide your foot back down until your leg is flat on the floor again.  Angel Wings: Lying on your back spread your legs to the side as far apart as you can without causing discomfort.  Hamstring Strength:  Lying on your back, push your heel against the floor with your leg straight by tightening up the muscles of your buttocks.  Repeat, but this time  bend your knee to a comfortable angle, and push your heel against the floor.  You may put a pillow under the heel to make it more comfortable if necessary.   A rehabilitation program following joint replacement surgery can speed recovery and prevent re-injury in the future due to weakened muscles. Contact your doctor or a physical therapist for more information on knee rehabilitation.    CONSTIPATION  Constipation is defined medically as fewer than three stools per week and severe constipation as less than one stool per week.  Even if you have a regular bowel pattern at home, your normal regimen is likely to be disrupted due to multiple reasons following surgery.  Combination of anesthesia, postoperative narcotics, change in appetite and fluid intake all can affect your bowels.   YOU MUST use at least one of the following options; they are listed in order of increasing strength to get the job done.  They are all available over the counter, and you may need to use some, POSSIBLY  even all of these options:    Drink plenty of fluids (prune juice may be helpful) and high fiber foods Colace 100 mg by mouth twice a day  Senokot for constipation as directed and as needed Dulcolax (bisacodyl), take with full glass of water  Miralax (polyethylene glycol) once or twice a day as needed.  If you have tried all these things and are unable to have a bowel movement in the first 3-4 days after surgery call either your surgeon or your primary doctor.    If you experience loose stools or diarrhea, hold the medications until you stool forms back up.  If your symptoms do not get better within 1 week or if they get worse, check with your doctor.  If you experience "the worst abdominal pain ever" or develop nausea or vomiting, please contact the office immediately for further recommendations for treatment.   ITCHING:  If you experience itching with your medications, try taking only a single pain pill, or even half a pain pill at a time.  You can also use Benadryl over the counter for itching or also to help with sleep.   TED HOSE STOCKINGS:  Use stockings on both legs until for at least 2 weeks or as directed by physician office. They may be removed at night for sleeping.  MEDICATIONS:  See your medication summary on the "After Visit Summary" that nursing will review with you.  You may have some home medications which will be placed on hold until you complete the course of blood thinner medication.  It is important for you to complete the blood thinner medication as prescribed.  Take medicines as prescribed.   You have several different medicines that work in different ways. - Tylenol is for mild to moderate pain. Try to take this medicine before turning to your narcotic medicines.  - Meloxicam is to reduce pain / inflammation - Robaxin is for muscle spasms. This medicine can make you drowsy. - Oxycodone is a narcotic pain medicine.  Take this for severe pain. This medicine can be  dehydrating / constipating. - Phenergan is for nausea and vomiting. - Omeprazole is for gastric protection while taking pain medicine.  - Aspirin is to prevent blood clots after surgery.   PRECAUTIONS:  If you experience chest pain or shortness of breath - call 911 immediately for transfer to the hospital emergency department.   If you develop a fever greater that 101 F, purulent drainage from wound, increased  redness or drainage from wound, foul odor from the wound/dressing, or calf pain - CONTACT YOUR SURGEON.                                                   FOLLOW-UP APPOINTMENTS:  If you do not already have a post-op appointment, please call the office [phone] for an appointment to be seen by Dr. Percell Miller in 2 weeks.   OTHER INSTRUCTIONS:   MAKE SURE YOU:  Understand these instructions.  Get help right away if you are not doing well or get worse.    Thank you for letting us be a part of your medical care team.  It is a privilege we respect greatly.  We hope these instructions will help you stay on track for a fast and full recovery!   Driving restrictions   Complete by: As directed    No driving for 2-4 weeks   Post-operative opioid taper instructions:   Complete by: As directed    POST-OPERATIVE OPIOID TAPER INSTRUCTIONS: It is important to wean off of your opioid medication as soon as possible. If you do not need pain medication after your surgery it is ok to stop day one. Opioids include: Codeine, Hydrocodone(Norco, Vicodin), Oxycodone(Percocet, oxycontin) and hydromorphone amongst others.  Long term and even short term use of opiods can cause: Increased pain response Dependence Constipation Depression Respiratory depression And more.  Withdrawal symptoms can include Flu like symptoms Nausea, vomiting And more Techniques to manage these symptoms Hydrate well Eat regular healthy meals Stay active Use relaxation techniques(deep breathing, meditating, yoga) Do Not  substitute Alcohol to help with tapering If you have been on opioids for less than two weeks and do not have pain than it is ok to stop all together.  Plan to wean off of opioids This plan should start within one week post op of your joint replacement. Maintain the same interval or time between taking each dose and first decrease the dose.  Cut the total daily intake of opioids by one tablet each day Next start to increase the time between doses. The last dose that should be eliminated is the evening dose.      TED hose   Complete by: As directed    Use stockings (TED hose) for 2 weeks on right leg(s).  You may remove them at night for sleeping.   Weight bearing as tolerated   Complete by: As directed       Allergies as of 05/19/2022       Reactions   Latex Other (See Comments)   blisters   Zofran [ondansetron Hcl] Nausea And Vomiting, Other (See Comments)   headache        Medication List     STOP taking these medications    baclofen 10 MG tablet Commonly known as: LIORESAL   ibuprofen 200 MG tablet Commonly known as: ADVIL       TAKE these medications    acetaminophen 500 MG tablet Commonly known as: TYLENOL Take 2 tablets (1,000 mg total) by mouth every 6 (six) hours as needed for moderate pain or mild pain. What changed:  how much to take when to take this reasons to take this   albuterol 108 (90 Base) MCG/ACT inhaler Commonly known as: VENTOLIN HFA INHALE 2 PUFFS EVERY 6 HOURS AS NEEDED.   allopurinol  100 MG tablet Commonly known as: ZYLOPRIM TAKE 1 TABLET BY MOUTH  DAILY What changed:  when to take this reasons to take this   amLODipine 2.5 MG tablet Commonly known as: NORVASC TAKE 1 TABLET BY MOUTH  DAILY   aspirin EC 81 MG tablet Take 1 tablet (81 mg total) by mouth 2 (two) times daily. To prevent blood clots for 30 days after surgery.   cyanocobalamin 1000 MCG/ML injection Commonly known as: VITAMIN B12 1000 mcg (1 mg) injection once  per per month or as directed   diclofenac sodium 1 % Gel Commonly known as: VOLTAREN Apply 4 g topically 4 (four) times daily. What changed:  when to take this reasons to take this   escitalopram 20 MG tablet Commonly known as: LEXAPRO TAKE 1 TABLET BY MOUTH  DAILY   ezetimibe 10 MG tablet Commonly known as: ZETIA TAKE 1 TABLET BY MOUTH  DAILY   famotidine 20 MG tablet Commonly known as: PEPCID TAKE 1 TABLET BY MOUTH AT  BEDTIME   levothyroxine 50 MCG tablet Commonly known as: SYNTHROID Take 50 mcg by mouth daily before breakfast.   meloxicam 15 MG tablet Commonly known as: MOBIC Take 1 tablet (15 mg total) by mouth daily as needed for pain (and inflammation).   methocarbamol 750 MG tablet Commonly known as: Robaxin-750 Take 1 tablet (750 mg total) by mouth every 8 (eight) hours as needed for muscle spasms.   oxybutynin 5 MG tablet Commonly known as: DITROPAN TAKE 1 TABLET BY MOUTH 2  TIMES DAILY AS NEEDED FOR  BLADDER SPASMS.   Oxycodone HCl 10 MG Tabs Take 0.5-1 tablets (5-10 mg total) by mouth every 8 (eight) hours as needed (for severe pain after surgery).   pantoprazole 40 MG tablet Commonly known as: PROTONIX TAKE 1 TABLET BY MOUTH  TWICE DAILY 30 TO 60  MINUTES BEFORE MEALS   promethazine 12.5 MG tablet Commonly known as: PHENERGAN Take 1 tablet (12.5 mg total) by mouth every 6 (six) hours as needed for nausea or vomiting.   rosuvastatin 40 MG tablet Commonly known as: CRESTOR TAKE 1 TABLET BY MOUTH  DAILY   Spiriva Respimat 2.5 MCG/ACT Aers Generic drug: Tiotropium Bromide Monohydrate INHALE 2 PUFFS INTO THE LUNGS ONCE DAILY.   topiramate 200 MG tablet Commonly known as: TOPAMAX TAKE 1 TABLET BY MOUTH  DAILY               Durable Medical Equipment  (From admission, onward)           Start     Ordered   05/19/22 0811  For home use only DME Walker rolling  Once       Question Answer Comment  Walker: With 5 Inch Wheels   Patient  needs a walker to treat with the following condition Status post hip surgery      05/19/22 0810              Discharge Care Instructions  (From admission, onward)           Start     Ordered   05/19/22 0000  Weight bearing as tolerated        05/19/22 1411            Follow-up Information     Renette Butters, MD. Go on 06/02/2022.   Specialty: Orthopedic Surgery Why: Your appointment is scheduled for 4:15. Contact information: [address] [place] [address] [phone]  [facility]- [facility]. Go on 05/20/2022.   Why: Your outpatient physical therapy has been ordered and the facility should contact you with a time Contact information: [address]  [place], [place]                Signed: Britt Bottom PA-C 05/19/2022, 2:11 PM

## 2022-05-19 NOTE — Plan of Care (Signed)
  Problem: Education: Goal: Knowledge of General Education information will improve Description: Including pain rating scale, medication(s)/side effects and non-pharmacologic comfort measures Outcome: Progressing   Problem: Activity: Goal: Risk for activity intolerance will decrease Outcome: Progressing   Problem: Pain Managment: Goal: General experience of comfort will improve Outcome: Progressing   

## 2022-05-19 NOTE — TOC Transition Note (Signed)
Transition of Care [facility]) - CM/SW Discharge Note  Patient Details  Name: AFTEN LIPSEY MRN: [id number] Date of Birth: 03/27/1956  Transition of Care [facility] CM/SW Contact:  Sherie Don, LCSW Phone Number: 05/19/2022, 10:47 AM  Clinical Narrative: Patient is expected to discharge home after working with PT. CSW met with patient to confirm discharge plan and needs. Patient will go home with OPPT at [facility]. Patient will need a youth rolling walker, which was delivered to patient's room by [employer]. TOC signing off.    Final next level of care: OP Rehab Barriers to Discharge: No Barriers Identified  Patient Goals and CMS Choice Patient states their goals for this hospitalization and ongoing recovery are:: Discharge home with OPPT at [facility] CMS Medicare.gov Compare Post Acute Care list provided to:: Patient Choice offered to / list presented to : Patient  Discharge Plan and Services       DME Arranged: Gil[facility] Rile youth DME Agency: [employer] Representative spoke with at DME Agency: Prearranged in orthopedist's office  Readmission Risk Interventions     No data to display

## 2022-05-20 ENCOUNTER — Encounter (HOSPITAL_COMMUNITY): Payer: Self-pay | Admitting: Physical Therapy

## 2022-05-20 ENCOUNTER — Ambulatory Visit (HOSPITAL_COMMUNITY): Payer: Medicare Other | Attending: Orthopedic Surgery | Admitting: Physical Therapy

## 2022-05-20 DIAGNOSIS — M25551 Pain in right hip: Secondary | ICD-10-CM

## 2022-05-20 DIAGNOSIS — R2689 Other abnormalities of gait and mobility: Secondary | ICD-10-CM

## 2022-05-20 DIAGNOSIS — M6281 Muscle weakness (generalized): Secondary | ICD-10-CM | POA: Diagnosis not present

## 2022-05-20 DIAGNOSIS — R29898 Other symptoms and signs involving the musculoskeletal system: Secondary | ICD-10-CM | POA: Diagnosis not present

## 2022-05-20 NOTE — Therapy (Signed)
OUTPATIENT PHYSICAL THERAPY LOWER EXTREMITY EVALUATION   Patient Name: Alison Brooks MRN: [id number] DOB:1955/12/18, 66 y.o., female Today's Date: 05/20/2022   PT End of Session - 05/20/22 1111     Visit Number 1    Number of Visits 12    Date for PT Re-Evaluation 07/01/22    Authorization Type U[employer] Medicare (no visit limit, no pre auth required)    Progress Note Due on Visit 10    PT Start Time 1115    PT Stop Time 1156    PT Time Calculation (min) 41 min    Activity Tolerance Patient tolerated treatment well    Behavior During Therapy WFL for tasks assessed/performed             Past Medical History:  Diagnosis Date   Anxiety    Arthritis    back, neck   Chronic back pain    COPD (chronic obstructive pulmonary disease) ([place]) 2021   after Covid   Depression    Dyspnea    Since covid , NOW ON INHALERS   Fatty liver    Fibromyalgia    GERD (gastroesophageal reflux disease)    Heart murmur    Hematuria    History of adenomatous polyp of colon    History of COVID-19 10/2020   History of kidney stones    History of seizure    x1 2007 post op (per pt neurologist work-up done ?mixture of anesthesia medication and prozac that pt had taken)  and has not any issues since    History of squamous cell carcinoma excision    Hyperlipidemia    Hypertension    Hypothyroidism    IC (interstitial cystitis)    Long COVID    Migraine    Osteoporosis    Pulmonary nodules    multiple per ct -- monitored by pcp   Right ureteral stone    RLS (restless legs syndrome)    Past Surgical History:  Procedure Laterality Date   CARDIOVASCULAR STRESS TEST  09/02/2004   normal perfusion study/  normal LV function and wall motion, ef 64%   CARPAL TUNNEL RELEASE Right 1998   CERVICAL FUSION  1992   CHOLECYSTECTOMY  1995   COLONOSCOPY     CYSTO/  BOTOX INJECTION  12-27-2008  &  04-01-2006   CYSTO/  HYDRODISTENTION/  INSTILLATION THERAPY  03/04/2006   CYSTOSCOPY W/ URETERAL STENT  PLACEMENT Right 04/02/2016   Procedure: CYSTOSCOPY WITH RIGHT RETROGRADE, URETEROSCOPY PYELOGRAM LASER [place];  Surgeon: Kathie Rhodes, MD;  Location: [place];  Service: Urology;  Laterality: Right;   HEMORRHOID SURGERY  01/03/2004   HOLMIUM LASER APPLICATION Right 32/99/2426   Procedure: HOLMIUM LASER APPLICATION;  Surgeon: Kathie Rhodes, MD;  Location[facility];  Service: Urology;  Laterality: Right;   LAPAROSCOPIC RIGHT HEMI COLECTOMY N/A 06/12/2021   Procedure: LAPAROSCOPIC RIGHT HEMI COLECTOMY;  Surgeon: Ileana Roup, MD;  Location: [facility];  Service: General;  Laterality: N/A;   LUMBAR FUSION  1996   L4 --S1   ROTATOR CUFF REPAIR Left 2004   SHOULDER ARTHROSCOPY WITH BICEPS TENDON REPAIR Left 12/2015   SPINAL CORD STIMULATOR IMPLANT  x4  last one 2013   STONE EXTRACTION WITH BASKET Right 04/02/2016   Procedure: STONE EXTRACTION WITH BASKET;  Surgeon: Kathie Rhodes, MD;  [facility];  Service: Urology;  Laterality: Right;   TONSILLECTOMY  1964   TOTAL HIP ARTHROPLASTY Right 05/18/2022   Procedure:  TOTAL HIP ARTHROPLASTY ANTERIOR APPROACH;  Surgeon: Renette Butters, MD;  Location: [facility];  Service: Orthopedics;  Laterality: Right;   TRANSTHORACIC ECHOCARDIOGRAM  04/17/2014   EF 60-65%/  trivial AR, MR, and TR   UPPER GASTROINTESTINAL ENDOSCOPY     VAGINAL HYSTERECTOMY  1984   WRIST GANGLION EXCISION Right 02/05/2004   Patient Active Problem List   Diagnosis Date Noted   S/P total right hip arthroplasty 05/18/2022   S/P right hemicolectomy 06/12/2021   Aortic atherosclerosis ([place]) 03/31/2021   COPD (chronic obstructive pulmonary disease) ([place]) 11/24/2020   History of total hysterectomy 07/17/2019   Hypothyroidism 07/17/2019   Vertigo 11/06/2016   Dyspnea 11/02/2014   Upper airway cough syndrome 11/02/2014   Gout 06/28/2014   Vitamin B12 deficiency 06/28/2014   Multiple pulmonary  nodules 06/28/2014   Essential hypertension 05/17/2014   Obesity (BMI 30-39.9) 01/14/2014   Lateral epicondylitis (tennis elbow) 01/02/2014   GERD (gastroesophageal reflux disease) 03/26/2013   LEG CRAMPS 05/16/2009   RESTLESS LEG SYNDROME, SEVERE 03/14/2009   Chronic interstitial cystitis 01/20/2009   Depression, major, recurrent ([place]) 03/19/2008   TUBULOVILLOUS ADENOMA, COLON 02/17/2008   Hyperlipidemia 02/17/2008   NEPHROLITHIASIS 02/17/2008   Former smoker 12/21/2007   MIGRAINE, CLASSICAL W/O INTRACTABLE MIGRAINE 06/09/2007   SYMPTOM, MEMORY LOSS 06/09/2007   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 She did some therapy at the hospital. She has been having a lot of pain. She has been doing ankle pumps, a few hip abduction.   PERTINENT HISTORY: COPD, Fibromyalgia, Anxiety, depression, hx long covid, osteoporosis, HTN, HLD, hx lumbar fusion, spinal cord stimulator  PAIN:  Are you having pain? Yes: NPRS scale: 6/10 Pain location: R hip  Pain description: "all of the above" Aggravating factors: movement, walking Relieving factors: Meds  PRECAUTIONS: None  WEIGHT BEARING RESTRICTIONS No  FALLS:  Has patient fallen in last 6 months? Yes. Number of falls 4-5  LIVING ENVIRONMENT: Lives with: lives with their spouse Lives in: House/apartment Stairs: Yes: External: 2 steps; on right going up Has following equipment at home: Single point cane, Walker  - 2 wheeled, and shower chair  OCCUPATION: part time caregiver  PLOF: Independent  PATIENT GOALS Get better   OBJECTIVE:   PATIENT SURVEYS:  FOTO 15% function  COGNITION:  Overall cognitive status: Within functional limits for tasks assessed     SENSATION: WFL   POSTURE: rounded shoulders and forward head  PALPATION: Grossly TTP R hip/quads  LOWER EXTREMITY ROM: WFL for tasks assessed  Active ROM Right eval Left eval  Hip flexion    Hip extension    Hip abduction    Hip adduction    Hip internal rotation    Hip external rotation    Knee flexion    Knee extension    Ankle dorsiflexion    Ankle plantarflexion    Ankle inversion    Ankle eversion     (Blank rows = not tested)  LOWER EXTREMITY MMT:  MMT Right eval Left eval  Hip flexion 3- * 4  Hip extension    Hip abduction    Hip adduction    Hip internal rotation    Hip external rotation    Knee flexion 4 * 5  Knee extension 4 * 5  Ankle dorsiflexion 4 5  Ankle plantarflexion    Ankle inversion    Ankle eversion     (Blank rows = not tested) *=pain   FUNCTIONAL TESTS:  5 times sit to stand: n/t 2 minute walk test: 200 feet with RW Stairs: able to complete 4 inch stairs with alternating pattern with UE support, greatest difficulty with R hip flexion Transfers: requires UE support, labored  GAIT: Distance walked: 200 feet Assistive device utilized: Environmental consultant - 2 wheeled Level of assistance: Modified independence Comments: 2MWT, with RW, antalgic, gait improves with time; able to ambulate without AD with antalgic gait without UE support and requires increased time    TODAY'S TREATMENT: 05/20/22 Ankle pumps x 20  Supine glute sets 10 x 5-10 second holds Supine heel slides with strap 2x 10 Bridge 2x 10    PATIENT EDUCATION:  Education details: Patient educated on exam findings, POC, scope of PT, HEP, and frequent mobility but not excessive activity. Person educated: Patient Education  method: Explanation, Demonstration, and Handouts Education comprehension: verbalized understanding, returned demonstration, verbal cues required, and tactile cues required    HOME EXERCISE PROGRAM: Access Code: [email or web address] Date: 05/20/2022 - Supine Ankle Pumps  - 3 x daily - 7 x weekly - 1 sets - 20 reps - Supine Gluteal Sets  - 3 x daily - 7 x weekly - 10 reps - 5-10 second hold - Bridge  - 3 x daily - 7 x weekly - 2 sets - 10 reps - Supine Heel Slide with Strap (Mirrored)  - 3 x daily - 7 x weekly - 2 sets - 10 reps  ASSESSMENT:  CLINICAL IMPRESSION: Patient a 66 y.o. y.o. female who was seen today for physical therapy evaluation and treatment for s/p R anterior THA on 05/18/22. Patient presents with pain limited deficits in R hip strength, ROM, endurance, activity tolerance, gait, balance and functional mobility with ADL. Patient is having to modify and restrict ADL as indicated by outcome measure score as well as subjective information and objective measures which is affecting overall participation. Patient will benefit from skilled physical therapy in order to improve function and reduce impairment.    OBJECTIVE IMPAIRMENTS Abnormal gait, decreased activity tolerance, decreased balance, decreased endurance, decreased mobility, difficulty walking, decreased ROM, decreased strength, increased muscle spasms, impaired flexibility, improper body mechanics, and pain.   ACTIVITY LIMITATIONS carrying, lifting, bending, standing, squatting, sleeping, stairs, transfers, dressing, locomotion level, and caring for others  PARTICIPATION LIMITATIONS: meal prep, cleaning, laundry, shopping, community activity, and yard work  PERSONAL FACTORS Time since onset of injury/illness/exacerbation and 3+ comorbidities: COPD, Fibromyalgia, Anxiety, depression, hx long covid, osteoporosis, HTN, HLD, hx lumbar fusion, spinal cord stimulator  are also affecting patient's functional outcome.   REHAB POTENTIAL:  Good  CLINICAL DECISION MAKING: Stable/uncomplicated  EVALUATION COMPLEXITY: Low   GOALS: Goals reviewed with patient? Yes  SHORT TERM GOALS: Target date: 06/10/2022  Patient will be independent with HEP in order to improve functional outcomes. Baseline:  Goal status: INITIAL  2.  Patient will report at least 25% improvement in symptoms for improved quality of life. Baseline:  Goal status: INITIAL   LONG TERM GOALS: Target date: 07/01/2022  Patient will report  at least 75% improvement in symptoms for improved quality of life. Baseline:  Goal status: INITIAL  2.  Patient will improve FOTO score by at least 37 points in order to indicate improved tolerance to activity. Baseline: 7/27 15% function Goal status: INITIAL  3.  Patient will be able to navigate standard stairs with reciprocal pattern without compensation in order to demonstrate improved LE strength. Baseline: 05/20/22: 4 inch stairs with UE support, alternating, with compensations Goal status: INITIAL  4.  Patient will be able to ambulate at least 400 feet in 2MWT in order to demonstrate improved tolerance to activity. Baseline: 200 with RW Goal status: INITIAL  5.  Patient will demonstrate grade of 5/5 MMT grade in all tested musculature as evidence of improved strength to assist with stair ambulation and gait.   Baseline: see MMT Goal status: INITIAL   PLAN: PT FREQUENCY: 2x/week  PT DURATION: 6 weeks  PLANNED INTERVENTIONS: Therapeutic exercises, Therapeutic activity, Neuromuscular re-education, Balance training, Gait training, Patient/Family education, Joint manipulation, Joint mobilization, Stair training, Orthotic/Fit training, DME instructions, Aquatic Therapy, Dry Needling, Electrical stimulation, Spinal manipulation, Spinal mobilization, Cryotherapy, Moist heat, Compression bandaging, scar mobilization, Splintting, Taping, Traction, Ultrasound, Ionotophoresis '4mg'$ /ml Dexamethasone, and Manual  therapy   PLAN FOR NEXT SESSION: f/u with HEP, trial SPC, L hip mobility and strength, progress as tolerated   Mearl Latin, PT 05/20/2022, 12:02 PM

## 2022-05-24 ENCOUNTER — Ambulatory Visit (HOSPITAL_COMMUNITY): Payer: Medicare Other | Admitting: Physical Therapy

## 2022-05-24 DIAGNOSIS — R29898 Other symptoms and signs involving the musculoskeletal system: Secondary | ICD-10-CM | POA: Diagnosis not present

## 2022-05-24 DIAGNOSIS — R2689 Other abnormalities of gait and mobility: Secondary | ICD-10-CM

## 2022-05-24 DIAGNOSIS — M25551 Pain in right hip: Secondary | ICD-10-CM | POA: Diagnosis not present

## 2022-05-24 DIAGNOSIS — M6281 Muscle weakness (generalized): Secondary | ICD-10-CM | POA: Diagnosis not present

## 2022-05-24 NOTE — Therapy (Signed)
OUTPATIENT PHYSICAL THERAPY TREATMENT   Patient Name: Alison Brooks MRN: [id number] DOB:10-Jan-1956, 66 y.o., female Today's Date: 05/24/2022   PT End of Session - 05/24/22 1120     Visit Number 2    Number of Visits 12    Date for PT Re-Evaluation 07/01/22    Authorization Type UHC Medicare (no visit limit, no pre auth required)    Progress Note Due on Visit 10    PT Start Time 1120    PT Stop Time 1158    PT Time Calculation (min) 38 min    Activity Tolerance Patient tolerated treatment well    Behavior During Therapy WFL for tasks assessed/performed             Past Medical History:  Diagnosis Date   Anxiety    Arthritis    back, neck   Chronic back pain    COPD (chronic obstructive pulmonary disease) ([place]) 2021   after Covid   Depression    Dyspnea    Since covid , NOW ON INHALERS   Fatty liver    Fibromyalgia    GERD (gastroesophageal reflux disease)    Heart murmur    Hematuria    History of adenomatous polyp of colon    History of COVID-19 10/2020   History of kidney stones    History of seizure    x1 2007 post op (per pt neurologist work-up done ?mixture of anesthesia medication and prozac that pt had taken)  and has not any issues since    History of squamous cell carcinoma excision    Hyperlipidemia    Hypertension    Hypothyroidism    IC (interstitial cystitis)    Long COVID    Migraine    Osteoporosis    Pulmonary nodules    multiple per ct -- monitored by pcp   Right ureteral stone    RLS (restless legs syndrome)    Past Surgical History:  Procedure Laterality Date   CARDIOVASCULAR STRESS TEST  09/02/2004   normal perfusion study/  normal LV function and wall motion, ef 64%   CARPAL TUNNEL RELEASE Right 1998   CERVICAL FUSION  1992   CHOLECYSTECTOMY  1995   COLONOSCOPY     CYSTO/  BOTOX INJECTION  12-27-2008  &  04-01-2006   CYSTO/  HYDRODISTENTION/  INSTILLATION THERAPY  03/04/2006   CYSTOSCOPY W/ URETERAL STENT PLACEMENT Right  04/02/2016   Procedure: CYSTOSCOPY WITH RIGHT RETROGRADE, URETEROSCOPY PYELOGRAM LASER [place];  Surgeon: Kathie Rhodes, MD;  Location: [facility];  Service: Urology;  Laterality: Right;   HEMORRHOID SURGERY  01/03/2004   HOLMIUM LASER APPLICATION Right 32/95/1884   Procedure: HOLMIUM LASER APPLICATION;  Surgeon: Kathie Rhodes, MD;  Location: [facility];  Service: Urology;  Laterality: Right;   LAPAROSCOPIC RIGHT HEMI COLECTOMY N/A 06/12/2021   Procedure: LAPAROSCOPIC RIGHT HEMI COLECTOMY;  Surgeon: Ileana Roup, MD;  Location: [facility];  Service: General;  Laterality: N/A;   LUMBAR FUSION  1996   L4 --S1   ROTATOR CUFF REPAIR Left 2004   SHOULDER ARTHROSCOPY WITH BICEPS TENDON REPAIR Left 12/2015   SPINAL CORD STIMULATOR IMPLANT  x4  last one 2013   STONE EXTRACTION WITH BASKET Right 04/02/2016   Procedure: STONE EXTRACTION WITH BASKET;  Surgeon: Kathie Rhodes, MD;  Location: [facility];  Service: Urology;  Laterality: Right;   TONSILLECTOMY  1964   TOTAL HIP ARTHROPLASTY Right 05/18/2022   Procedure: TOTAL HIP  ARTHROPLASTY ANTERIOR APPROACH;  Surgeon: Renette Butters, MD;  Location: [facility];  Service: Orthopedics;  Laterality: Right;   TRANSTHORACIC ECHOCARDIOGRAM  04/17/2014   EF 60-65%/  trivial AR, MR, and TR   UPPER GASTROINTESTINAL ENDOSCOPY     VAGINAL HYSTERECTOMY  1984   WRIST GANGLION EXCISION Right 02/05/2004   Patient Active Problem List   Diagnosis Date Noted   S/P total right hip arthroplasty 05/18/2022   S/P right hemicolectomy 06/12/2021   Aortic atherosclerosis ([place]) 03/31/2021   COPD (chronic obstructive pulmonary disease) ([place]) 11/24/2020   History of total hysterectomy 07/17/2019   Hypothyroidism 07/17/2019   Vertigo 11/06/2016   Dyspnea 11/02/2014   Upper airway cough syndrome 11/02/2014   Gout 06/28/2014   Vitamin B12 deficiency 06/28/2014   Multiple pulmonary nodules 06/28/2014    Essential hypertension 05/17/2014   Obesity (BMI 30-39.9) 01/14/2014   Lateral epicondylitis (tennis elbow) 01/02/2014   GERD (gastroesophageal reflux disease) 03/26/2013   LEG CRAMPS 05/16/2009   RESTLESS LEG SYNDROME, SEVERE 03/14/2009   Chronic interstitial cystitis 01/20/2009   Depression, major, recurrent ([place]) 03/19/2008   TUBULOVILLOUS ADENOMA, COLON 02/17/2008   Hyperlipidemia 02/17/2008   NEPHROLITHIASIS 02/17/2008   Former smoker 12/21/2007   MIGRAINE, CLASSICAL W/O INTRACTABLE MIGRAINE 06/09/2007   SYMPTOM, MEMORY LOSS 06/09/2007   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 Been working on [employer].   PERTINENT HISTORY: COPD, Fibromyalgia, Anxiety, depression, hx long covid, osteoporosis, HTN, HLD, hx lumbar fusion, spinal cord stimulator  PAIN:  Are you having pain? Yes: NPRS scale: 7-8/10 Pain location: R hip  Pain description: "all of the above" Aggravating factors: movement, walking Relieving factors: Meds  PRECAUTIONS: None  WEIGHT BEARING RESTRICTIONS No  FALLS:  Has patient fallen in last 6 months? Yes. Number of falls 4-5  LIVING ENVIRONMENT: Lives with: lives with their spouse Lives in: House/apartment Stairs: Yes: External: 2 steps; on right going up Has following equipment at home: Single point cane, Walker - 2 wheeled, and shower chair  OCCUPATION: part time caregiver  PLOF: Independent  PATIENT GOALS Get better   OBJECTIVE: (objective  measures from initial evaluation unless otherwise dated)  PATIENT SURVEYS:  FOTO 15% function  COGNITION:  Overall cognitive status: Within functional limits for tasks assessed     SENSATION: WFL   POSTURE: rounded shoulders and forward head  PALPATION: Grossly TTP R hip/quads  LOWER EXTREMITY ROM: WFL for tasks assessed  Active ROM Right eval Left eval  Hip flexion    Hip extension    Hip abduction    Hip adduction    Hip internal rotation    Hip external rotation    Knee flexion    Knee extension    Ankle dorsiflexion    Ankle plantarflexion    Ankle inversion    Ankle eversion     (Blank rows = not tested)  LOWER EXTREMITY MMT:  MMT Right eval Left eval  Hip flexion 3- * 4  Hip extension    Hip abduction  Hip adduction    Hip internal rotation    Hip external rotation    Knee flexion 4 * 5  Knee extension 4 * 5  Ankle dorsiflexion 4 5  Ankle plantarflexion    Ankle inversion    Ankle eversion     (Blank rows = not tested) *=pain   FUNCTIONAL TESTS:  5 times sit to stand: n/t 2 minute walk test: 200 feet with RW Stairs: able to complete 4 inch stairs with alternating pattern with UE support, greatest difficulty with R hip flexion Transfers: requires UE support, labored  GAIT: Distance walked: 200 feet Assistive device utilized: Environmental consultant - 2 wheeled Level of assistance: Modified independence Comments: 2MWT, with RW, antalgic, gait improves with time; able to ambulate without AD with antalgic gait without UE support and requires increased time    TODAY'S TREATMENT: 05/24/22 Supine glute sets 10 x 5-10 second holds Supine heel slides with strap 3x 10 Bridge 2x 10  Supine hip abduction 2x 10  Hooklying hip abduction isometric with belt 10 x 10 second holds Hooklying hip adduction isometric with ball 10 x 10 second holds LAQ 10 x 5 second holds RLE Standing hip abduction 1x 10 bilateral   05/20/22 Ankle pumps x 20  Supine glute sets 10 x  5-10 second holds Supine heel slides with strap 2x 10 Bridge 2x 10    PATIENT EDUCATION:  Education details:05/24/22 HEP;  Eval: Patient educated on exam findings, POC, scope of PT, HEP, and frequent mobility but not excessive activity. Person educated: Patient Education method: Explanation, Demonstration, and Handouts Education comprehension: verbalized understanding, returned demonstration, verbal cues required, and tactile cues required    HOME EXERCISE PROGRAM: Access Code: [email or web address] Date: 05/20/2022 - Supine Ankle Pumps  - 3 x daily - 7 x weekly - 1 sets - 20 reps - Supine Gluteal Sets  - 3 x daily - 7 x weekly - 10 reps - 5-10 second hold - Bridge  - 3 x daily - 7 x weekly - 2 sets - 10 reps - Supine Heel Slide with Strap (Mirrored)  - 3 x daily - 7 x weekly - 2 sets - 10 reps  ASSESSMENT:  CLINICAL IMPRESSION: Began session with previously completed exercises and she requires minimal cueing with good carry over. Continued with mostly table exercises which are tolerated well but she notes moderate to high fatigue at end of session. Patient will continue to benefit from physical therapy in order to improve function and reduce impairment.     OBJECTIVE IMPAIRMENTS Abnormal gait, decreased activity tolerance, decreased balance, decreased endurance, decreased mobility, difficulty walking, decreased ROM, decreased strength, increased muscle spasms, impaired flexibility, improper body mechanics, and pain.   ACTIVITY LIMITATIONS carrying, lifting, bending, standing, squatting, sleeping, stairs, transfers, dressing, locomotion level, and caring for others  PARTICIPATION LIMITATIONS: meal prep, cleaning, laundry, shopping, community activity, and yard work  PERSONAL FACTORS Time since onset of injury/illness/exacerbation and 3+ comorbidities: COPD, Fibromyalgia, Anxiety, depression, hx long covid, osteoporosis, HTN, HLD, hx lumbar fusion, spinal cord stimulator  are also affecting  patient's functional outcome.   REHAB POTENTIAL: Good  CLINICAL DECISION MAKING: Stable/uncomplicated  EVALUATION COMPLEXITY: Low   GOALS: Goals reviewed with patient? Yes  SHORT TERM GOALS: Target date: 06/10/2022  Patient will be independent with HEP in order to improve functional outcomes. Baseline:  Goal status: INITIAL  2.  Patient will report at least 25% improvement in symptoms for improved quality of life. Baseline:  Goal status: INITIAL  LONG TERM GOALS: Target date: 07/01/2022  Patient will report at least 75% improvement in symptoms for improved quality of life. Baseline:  Goal status: INITIAL  2.  Patient will improve FOTO score by at least 37 points in order to indicate improved tolerance to activity. Baseline: 7/27 15% function Goal status: INITIAL  3.  Patient will be able to navigate standard stairs with reciprocal pattern without compensation in order to demonstrate improved LE strength. Baseline: 05/20/22: 4 inch stairs with UE support, alternating, with compensations Goal status: INITIAL  4.  Patient will be able to ambulate at least 400 feet in 2MWT in order to demonstrate improved tolerance to activity. Baseline: 200 with RW Goal status: INITIAL  5.  Patient will demonstrate grade of 5/5 MMT grade in all tested musculature as evidence of improved strength to assist with stair ambulation and gait.   Baseline: see MMT Goal status: INITIAL   PLAN: PT FREQUENCY: 2x/week  PT DURATION: 6 weeks  PLANNED INTERVENTIONS: Therapeutic exercises, Therapeutic activity, Neuromuscular re-education, Balance training, Gait training, Patient/Family education, Joint manipulation, Joint mobilization, Stair training, Orthotic/Fit training, DME instructions, Aquatic Therapy, Dry Needling, Electrical stimulation, Spinal manipulation, Spinal mobilization, Cryotherapy, Moist heat, Compression bandaging, scar mobilization, Splintting, Taping, Traction, Ultrasound,  Ionotophoresis '4mg'$ /ml Dexamethasone, and Manual therapy   PLAN FOR NEXT SESSION: f/u with HEP, trial SPC when able , L hip mobility and strength, progress as tolerated   Vianne Bulls Janara Klett, PT 05/24/2022, 11:20 AM

## 2022-05-26 ENCOUNTER — Encounter (HOSPITAL_COMMUNITY): Payer: Medicare Other | Admitting: Physical Therapy

## 2022-05-28 ENCOUNTER — Encounter (HOSPITAL_COMMUNITY): Payer: Self-pay

## 2022-05-28 ENCOUNTER — Ambulatory Visit (HOSPITAL_COMMUNITY): Payer: Medicare Other | Attending: Orthopedic Surgery

## 2022-05-28 DIAGNOSIS — R2689 Other abnormalities of gait and mobility: Secondary | ICD-10-CM | POA: Insufficient documentation

## 2022-05-28 DIAGNOSIS — M25551 Pain in right hip: Secondary | ICD-10-CM | POA: Diagnosis not present

## 2022-05-28 DIAGNOSIS — M6281 Muscle weakness (generalized): Secondary | ICD-10-CM | POA: Insufficient documentation

## 2022-05-28 DIAGNOSIS — R29898 Other symptoms and signs involving the musculoskeletal system: Secondary | ICD-10-CM | POA: Insufficient documentation

## 2022-05-28 NOTE — Therapy (Signed)
OUTPATIENT PHYSICAL THERAPY TREATMENT   Patient Name: Alison Brooks MRN: [id number] DOB:April 08, 1956, 66 y.o., female Today's Date: 05/28/2022   PT End of Session - 05/28/22 1123     Visit Number 3    Number of Visits 12    Date for PT Re-Evaluation 07/01/22    Authorization Type UHC Medicare (no visit limit, no pre auth required)    Progress Note Due on Visit 10    PT Start Time 1117    PT Stop Time 1155    PT Time Calculation (min) 38 min    Activity Tolerance Patient tolerated treatment well    Behavior During Therapy WFL for tasks assessed/performed              Past Medical History:  Diagnosis Date   Anxiety    Arthritis    back, neck   Chronic back pain    COPD (chronic obstructive pulmonary disease) ([place]) 2021   after Covid   Depression    Dyspnea    Since covid , NOW ON INHALERS   Fatty liver    Fibromyalgia    GERD (gastroesophageal reflux disease)    Heart murmur    Hematuria    History of adenomatous polyp of colon    History of COVID-19 10/2020   History of kidney stones    History of seizure    x1 2007 post op (per pt neurologist work-up done ?mixture of anesthesia medication and prozac that pt had taken)  and has not any issues since    History of squamous cell carcinoma excision    Hyperlipidemia    Hypertension    Hypothyroidism    IC (interstitial cystitis)    Long COVID    Migraine    Osteoporosis    Pulmonary nodules    multiple per ct -- monitored by pcp   Right ureteral stone    RLS (restless legs syndrome)    Past Surgical History:  Procedure Laterality Date   CARDIOVASCULAR STRESS TEST  09/02/2004   normal perfusion study/  normal LV function and wall motion, ef 64%   CARPAL TUNNEL RELEASE Right 1998   CERVICAL FUSION  1992   CHOLECYSTECTOMY  1995   COLONOSCOPY     CYSTO/  BOTOX INJECTION  12-27-2008  &  04-01-2006   CYSTO/  HYDRODISTENTION/  INSTILLATION THERAPY  03/04/2006   CYSTOSCOPY W/ URETERAL STENT PLACEMENT Right  04/02/2016   Procedure: CYSTOSCOPY WITH RIGHT RETROGRADE, URETEROSCOPY PYELOGRAM LASER [place];  Surgeon: Kathie Rhodes, MD;  Location: [facility];  Service: Urology;  Laterality: Right;   HEMORRHOID SURGERY  01/03/2004   HOLMIUM LASER APPLICATION Right 72/06/4708   Procedure: HOLMIUM LASER APPLICATION;  Surgeon: Kathie Rhodes, MD;  Location: [name];  Service: Urology;  Laterality: Right;   LAPAROSCOPIC RIGHT HEMI COLECTOMY N/A 06/12/2021   Procedure: LAPAROSCOPIC RIGHT HEMI COLECTOMY;  Surgeon: Ileana Roup, MD;  [facility];  Service: General;  Laterality: N/A;   LUMBAR FUSION  1996   L4 --S1   ROTATOR CUFF REPAIR Left 2004   SHOULDER ARTHROSCOPY WITH BICEPS TENDON REPAIR Left 12/2015   SPINAL CORD STIMULATOR IMPLANT  x4  last one 2013   STONE EXTRACTION WITH BASKET Right 04/02/2016   Procedure: STONE EXTRACTION WITH BASKET;  Surgeon: Kathie Rhodes, MD;  [facility];  Service: Urology;  Laterality: Right;   TONSILLECTOMY  1964   TOTAL HIP ARTHROPLASTY Right 05/18/2022   Procedure: TOTAL  HIP ARTHROPLASTY ANTERIOR APPROACH;  Surgeon: Renette Butters, MD;  Location[facility];  Service: Orthopedics;  Laterality: Right;   TRANSTHORACIC ECHOCARDIOGRAM  04/17/2014   EF 60-65%/  trivial AR, MR, and TR   UPPER GASTROINTESTINAL ENDOSCOPY     VAGINAL HYSTERECTOMY  1984   WRIST GANGLION EXCISION Right 02/05/2004   Patient Active Problem List   Diagnosis Date Noted   S/P total right hip arthroplasty 05/18/2022   S/P right hemicolectomy 06/12/2021   Aortic atherosclerosis ([place]) 03/31/2021   COPD (chronic obstructive pulmonary disease) ([place]) 11/24/2020   History of total hysterectomy 07/17/2019   Hypothyroidism 07/17/2019   Vertigo 11/06/2016   Dyspnea 11/02/2014   Upper airway cough syndrome 11/02/2014   Gout 06/28/2014   Vitamin B12 deficiency 06/28/2014   Multiple pulmonary nodules 06/28/2014    Essential hypertension 05/17/2014   Obesity (BMI 30-39.9) 01/14/2014   Lateral epicondylitis (tennis elbow) 01/02/2014   GERD (gastroesophageal reflux disease) 03/26/2013   LEG CRAMPS 05/16/2009   RESTLESS LEG SYNDROME, SEVERE 03/14/2009   Chronic interstitial cystitis 01/20/2009   Depression, major, recurrent ([place]) 03/19/2008   TUBULOVILLOUS ADENOMA, COLON 02/17/2008   Hyperlipidemia 02/17/2008   NEPHROLITHIASIS 02/17/2008   Former smoker 12/21/2007   MIGRAINE, CLASSICAL W/O INTRACTABLE MIGRAINE 06/09/2007   SYMPTOM, MEMORY LOSS 06/09/2007   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  Reports compliance with HEP daily without question.  PERTINENT HISTORY: COPD, Fibromyalgia, Anxiety, depression, hx long covid, osteoporosis, HTN, HLD, hx lumbar fusion, spinal cord stimulator  PAIN:  Are you having pain? Yes: NPRS scale: 4/10 Pain location: R hip  Pain description: tight, sore, achey Aggravating factors: movement, walking Relieving factors: Meds  PRECAUTIONS: None  WEIGHT BEARING RESTRICTIONS No  FALLS:  Has patient fallen in last 6 months? Yes. Number of falls 4-5  LIVING ENVIRONMENT: Lives with: lives with their spouse Lives in: House/apartment Stairs: Yes: External: 2 steps; on right going up Has following equipment at home: Single point cane, Walker - 2 wheeled, and shower  chair  OCCUPATION: part time caregiver  PLOF: Independent  PATIENT GOALS Get better   OBJECTIVE: (objective measures from initial evaluation unless otherwise dated)  PATIENT SURVEYS:  FOTO 15% function  COGNITION:  Overall cognitive status: Within functional limits for tasks assessed     SENSATION: WFL   POSTURE: rounded shoulders and forward head  PALPATION: Grossly TTP R hip/quads  LOWER EXTREMITY ROM: WFL for tasks assessed  Active ROM Right eval Left eval  Hip flexion    Hip extension    Hip abduction    Hip adduction    Hip internal rotation    Hip external rotation    Knee flexion    Knee extension    Ankle dorsiflexion    Ankle plantarflexion    Ankle inversion    Ankle eversion     (Blank rows = not tested)  LOWER EXTREMITY MMT:  MMT Right eval  Left eval  Hip flexion 3- * 4  Hip extension    Hip abduction    Hip adduction    Hip internal rotation    Hip external rotation    Knee flexion 4 * 5  Knee extension 4 * 5  Ankle dorsiflexion 4 5  Ankle plantarflexion    Ankle inversion    Ankle eversion     (Blank rows = not tested) *=pain   FUNCTIONAL TESTS:  5 times sit to stand: n/t 2 minute walk test: 200 feet with RW Stairs: able to complete 4 inch stairs with alternating pattern with UE support, greatest difficulty with R hip flexion Transfers: requires UE support, labored  GAIT: Distance walked: 200 feet Assistive device utilized: Environmental consultant - 2 wheeled Level of assistance: Modified independence Comments: 2MWT, with RW, antalgic, gait improves with time; able to ambulate without AD with antalgic gait without UE support and requires increased time    TODAY'S TREATMENT: 05/28/22 2MWT 223f with RW Rockerboard lateral 2' Heel/toe raise 15x 3D hip excursion (weight shift, rotate, STS standard height no HHA, extension) Toe tapping 6in step height alternating (1 HHA required with Rt LE weight bearing)  Supine:  Hooklying hip abduction  isometric with belt 10 x 10 second holds Hooklying hip adduction isometric with ball 10 x 10 second holds    Bridge 2x 10      Sidelying: clam 2 sets 5 reps 5" holds   05/24/22 Supine glute sets 10 x 5-10 second holds Supine heel slides with strap 3x 10 Bridge 2x 10  Supine hip abduction 2x 10  Hooklying hip abduction isometric with belt 10 x 10 second holds Hooklying hip adduction isometric with ball 10 x 10 second holds LAQ 10 x 5 second holds RLE Standing hip abduction 1x 10 bilateral   05/20/22 Ankle pumps x 20  Supine glute sets 10 x 5-10 second holds Supine heel slides with strap 2x 10 Bridge 2x 10    PATIENT EDUCATION:  Education details:05/24/22 HEP;  Eval: Patient educated on exam findings, POC, scope of PT, HEP, and frequent mobility but not excessive activity. Person educated: Patient Education method: Explanation, Demonstration, and Handouts Education comprehension: verbalized understanding, returned demonstration, verbal cues required, and tactile cues required    HOME EXERCISE PROGRAM: Access Code: [email or web address] Date: 05/20/2022 - Supine Ankle Pumps  - 3 x daily - 7 x weekly - 1 sets - 20 reps - Supine Gluteal Sets  - 3 x daily - 7 x weekly - 10 reps - 5-10 second hold - Bridge  - 3 x daily - 7 x weekly - 2 sets - 10 reps - Supine Heel Slide with Strap (Mirrored)  - 3 x daily - 7 x weekly - 2 sets - 10 reps  ASSESSMENT:  CLINICAL IMPRESSION: Added rockerboard to improve weight distribution with gait and 3D hip excursion for mobility, pt tolerated well to these new exercise.  Reports of increased fatigue and pain following marching to improve hip flexion and balance.  Also added STS and sidelying clam for gluteal strengthening.  Pt tolerated well to new exercises.  Reviewed RICE techniques for pain and edema control following session.    OBJECTIVE IMPAIRMENTS Abnormal gait, decreased activity tolerance, decreased balance, decreased endurance, decreased mobility,  difficulty walking, decreased ROM, decreased strength, increased muscle spasms, impaired flexibility, improper body mechanics, and pain.   ACTIVITY LIMITATIONS carrying, lifting, bending, standing, squatting, sleeping, stairs, transfers, dressing, locomotion level, and caring for others  PARTICIPATION LIMITATIONS: meal  prep, cleaning, laundry, shopping, community activity, and yard work  PERSONAL FACTORS Time since onset of injury/illness/exacerbation and 3+ comorbidities: COPD, Fibromyalgia, Anxiety, depression, hx long covid, osteoporosis, HTN, HLD, hx lumbar fusion, spinal cord stimulator  are also affecting patient's functional outcome.   REHAB POTENTIAL: Good  CLINICAL DECISION MAKING: Stable/uncomplicated  EVALUATION COMPLEXITY: Low   GOALS: Goals reviewed with patient? Yes  SHORT TERM GOALS: Target date: 06/10/2022  Patient will be independent with HEP in order to improve functional outcomes. Baseline:  Goal status: IN PROGRESS  2.  Patient will report at least 25% improvement in symptoms for improved quality of life. Baseline:  Goal status: IN PROGRESS   LONG TERM GOALS: Target date: 07/01/2022  Patient will report at least 75% improvement in symptoms for improved quality of life. Baseline:  Goal status: IN PROGRESS  2.  Patient will improve FOTO score by at least 37 points in order to indicate improved tolerance to activity. Baseline: 7/27 15% function Goal status: IN PROGRESS  3.  Patient will be able to navigate standard stairs with reciprocal pattern without compensation in order to demonstrate improved LE strength. Baseline: 05/20/22: 4 inch stairs with UE support, alternating, with compensations Goal status: IN PROGRESS  4.  Patient will be able to ambulate at least 400 feet in 2MWT in order to demonstrate improved tolerance to activity. Baseline: 200 with RW Goal status: IN PROGRESS  5.  Patient will demonstrate grade of 5/5 MMT grade in all tested  musculature as evidence of improved strength to assist with stair ambulation and gait.   Baseline: see MMT Goal status: IN PROGRESS   PLAN: PT FREQUENCY: 2x/week  PT DURATION: 6 weeks  PLANNED INTERVENTIONS: Therapeutic exercises, Therapeutic activity, Neuromuscular re-education, Balance training, Gait training, Patient/Family education, Joint manipulation, Joint mobilization, Stair training, Orthotic/Fit training, DME instructions, Aquatic Therapy, Dry Needling, Electrical stimulation, Spinal manipulation, Spinal mobilization, Cryotherapy, Moist heat, Compression bandaging, scar mobilization, Splintting, Taping, Traction, Ultrasound, Ionotophoresis '4mg'$ /ml Dexamethasone, and Manual therapy   PLAN FOR NEXT SESSION: f/u with HEP, trial SPC when able , L hip mobility and strength, progress as tolerated.  Progress note 05/31/22 prior MD apt on 06/02/22.  Ihor Austin, LPTA/CLT; CBIS [phone]  Aldona Lento, PTA 05/28/2022, 12:49 PM

## 2022-05-31 ENCOUNTER — Encounter (HOSPITAL_COMMUNITY): Payer: Self-pay | Admitting: Physical Therapy

## 2022-05-31 ENCOUNTER — Ambulatory Visit (HOSPITAL_COMMUNITY): Payer: Medicare Other | Admitting: Physical Therapy

## 2022-05-31 DIAGNOSIS — M6281 Muscle weakness (generalized): Secondary | ICD-10-CM | POA: Diagnosis not present

## 2022-05-31 DIAGNOSIS — R2689 Other abnormalities of gait and mobility: Secondary | ICD-10-CM | POA: Diagnosis not present

## 2022-05-31 DIAGNOSIS — M25551 Pain in right hip: Secondary | ICD-10-CM

## 2022-05-31 DIAGNOSIS — R29898 Other symptoms and signs involving the musculoskeletal system: Secondary | ICD-10-CM

## 2022-05-31 NOTE — Therapy (Signed)
OUTPATIENT PHYSICAL THERAPY TREATMENT   Patient Name: Alison Brooks MRN: [id number] DOB:10/27/1955, 65 y.o., female Today's Date: 05/31/2022  Progress Note   Reporting Period 05/20/22 to 05/31/22   See note below for Objective Data and Assessment of Progress/Goals    PT End of Session - 05/31/22 1123     Visit Number 4    Number of Visits 12    Date for PT Re-Evaluation 07/01/22    Authorization Type [employer] Medicare (no visit limit, no pre auth required)    Progress Note Due on Visit 14    PT Start Time 1122    PT Stop Time 1200    PT Time Calculation (min) 38 min    Activity Tolerance Patient tolerated treatment well    Behavior During Therapy WFL for tasks assessed/performed              Past Medical History:  Diagnosis Date   Anxiety    Arthritis    back, neck   Chronic back pain    COPD (chronic obstructive pulmonary disease) ([place]) 2021   after Covid   Depression    Dyspnea    Since covid , NOW ON INHALERS   Fatty liver    Fibromyalgia    GERD (gastroesophageal reflux disease)    Heart murmur    Hematuria    History of adenomatous polyp of colon    History of COVID-19 10/2020   History of kidney stones    History of seizure    x1 2007 post op (per pt neurologist work-up done ?mixture of anesthesia medication and prozac that pt had taken)  and has not any issues since    History of squamous cell carcinoma excision    Hyperlipidemia    Hypertension    Hypothyroidism    IC (interstitial cystitis)    Long COVID    Migraine    Osteoporosis    Pulmonary nodules    multiple per ct -- monitored by pcp   Right ureteral stone    RLS (restless legs syndrome)    Past Surgical History:  Procedure Laterality Date   CARDIOVASCULAR STRESS TEST  09/02/2004   normal perfusion study/  normal LV function and wall motion, ef 64%   CARPAL TUNNEL RELEASE Right 1998   CERVICAL FUSION  1992   CHOLECYSTECTOMY  1995   COLONOSCOPY     CYSTO/  BOTOX INJECTION  12-27-2008   &  04-01-2006   CYSTO/  HYDRODISTENTION/  INSTILLATION THERAPY  03/04/2006   CYSTOSCOPY W/ URETERAL STENT PLACEMENT Right 04/02/2016   Procedure: CYSTOSCOPY WITH RIGHT RETROGRADE, URETEROSCOPY PYELOGRAM LASER [employer];  Surgeon: Kathie Rhodes, MD;  Location: [place];  Service: Urology;  Laterality: Right;   HEMORRHOID SURGERY  01/03/2004   HOLMIUM LASER APPLICATION Right 16/96/7893   Procedure: HOLMIUM LASER APPLICATION;  Surgeon: Kathie Rhodes, MD;  Location: [facility];  Service: Urology;  Laterality: Right;   LAPAROSCOPIC RIGHT HEMI COLECTOMY N/A 06/12/2021   Procedure: LAPAROSCOPIC RIGHT HEMI COLECTOMY;  Surgeon: Ileana Roup, MD;  Location: [facility];  Service: General;  Laterality: N/A;   LUMBAR FUSION  1996   L4 --S1   ROTATOR CUFF REPAIR Left 2004   SHOULDER ARTHROSCOPY WITH BICEPS TENDON REPAIR Left 12/2015   SPINAL CORD STIMULATOR IMPLANT  x4  last one 2013   STONE EXTRACTION WITH BASKET Right 04/02/2016   Procedure: STONE EXTRACTION WITH BASKET;  Surgeon: Kathie Rhodes, MD;  Location: [facility];  Service: Urology;  Laterality: Right;   TONSILLECTOMY  1964   TOTAL HIP ARTHROPLASTY Right 05/18/2022   Procedure: TOTAL HIP ARTHROPLASTY ANTERIOR APPROACH;  Surgeon: Renette Butters, MD;  Location: [facility];  Service: Orthopedics;  Laterality: Right;   TRANSTHORACIC ECHOCARDIOGRAM  04/17/2014   EF 60-65%/  trivial AR, MR, and TR   UPPER GASTROINTESTINAL ENDOSCOPY     VAGINAL HYSTERECTOMY  1984   WRIST GANGLION EXCISION Right 02/05/2004   Patient Active Problem List   Diagnosis Date Noted   S/P total right hip arthroplasty 05/18/2022   S/P right hemicolectomy 06/12/2021   Aortic atherosclerosis ([place]) 03/31/2021   COPD (chronic obstructive pulmonary disease) ([place]) 11/24/2020   History of total hysterectomy 07/17/2019   Hypothyroidism 07/17/2019   Vertigo 11/06/2016   Dyspnea 11/02/2014   Upper  airway cough syndrome 11/02/2014   Gout 06/28/2014   Vitamin B12 deficiency 06/28/2014   Multiple pulmonary nodules 06/28/2014   Essential hypertension 05/17/2014   Obesity (BMI 30-39.9) 01/14/2014   Lateral epicondylitis (tennis elbow) 01/02/2014   GERD (gastroesophageal reflux disease) 03/26/2013   LEG CRAMPS 05/16/2009   RESTLESS LEG SYNDROME, SEVERE 03/14/2009   Chronic interstitial cystitis 01/20/2009   Depression, major, recurrent ([place]) 03/19/2008   TUBULOVILLOUS ADENOMA, COLON 02/17/2008   Hyperlipidemia 02/17/2008   NEPHROLITHIASIS 02/17/2008   Former smoker 12/21/2007   MIGRAINE, CLASSICAL W/O INTRACTABLE MIGRAINE 06/09/2007   SYMPTOM, MEMORY LOSS 06/09/2007   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 Most sore in morning. Patient states 50% improvement with PT intervention. Working on [employer].   PERTINENT HISTORY: COPD, Fibromyalgia, Anxiety, depression, hx long covid, osteoporosis, HTN, HLD, hx lumbar fusion, spinal cord stimulator  PAIN:  Are you having pain? Yes: NPRS scale: 4/10 Pain location: R hip  Pain description: tight, sore, achey Aggravating factors: movement, walking Relieving factors: Meds  PRECAUTIONS: None  WEIGHT BEARING RESTRICTIONS No  FALLS:  Has patient fallen in last 6 months? Yes. Number of falls 4-5  LIVING ENVIRONMENT: Lives with: lives with their spouse Lives in: House/apartment Stairs: Yes: External: 2 steps; on right  going up Has following equipment at home: Single point cane, Walker - 2 wheeled, and shower chair  OCCUPATION: part time caregiver  PLOF: Independent  PATIENT GOALS Get better   OBJECTIVE: (objective measures from initial evaluation unless otherwise dated)  PATIENT SURVEYS:  FOTO 15% function  COGNITION:  Overall cognitive status: Within functional limits for tasks assessed     SENSATION: WFL   POSTURE: rounded shoulders and forward head  PALPATION: Grossly TTP R hip/quads  LOWER EXTREMITY ROM: WFL for tasks assessed  Active ROM Right eval Left eval  Hip flexion    Hip extension    Hip abduction    Hip adduction    Hip internal rotation    Hip external rotation    Knee flexion    Knee extension    Ankle dorsiflexion    Ankle plantarflexion    Ankle inversion    Ankle  eversion     (Blank rows = not tested)  LOWER EXTREMITY MMT:  MMT Right eval Left eval Right 05/31/22 Left  05/31/22  Hip flexion 3- * 4 3* 4+  Hip extension   3* 4-  Hip abduction   3+* 4+  Hip adduction      Hip internal rotation      Hip external rotation      Knee flexion 4 * 5 4+ 5  Knee extension 4 * 5 4+ 5  Ankle dorsiflexion _0 Ankle plantarflexion      Ankle inversion      Ankle eversion       (Blank rows = not tested) *=pain   FUNCTIONAL TESTS:  5 times sit to stand: n/t 2 minute walk test: 200 feet with RW Stairs: able to complete 4 inch stairs with alternating pattern with UE support, greatest difficulty with R hip flexion Transfers: requires UE support, labored  GAIT: Distance walked: 200 feet Assistive device utilized: Walker - 2 wheeled Level of assistance: Modified independence Comments: 2MWT, with RW, antalgic, gait improves with time; able to ambulate without AD with antalgic gait without UE support and requires increased time  Reassessment 05/31/22 5 times sit to stand: 11.4 seconds, labored without UE use.  2 minute walk test: 300 feet with RW Stairs:  able to complete 4 inch stairs with alternating pattern with UE support, greatest difficulty with R hip flexion Transfers: able to complete without UE support, labored GAIT: Distance walked: 300 feet Assistive device utilized: Environmental consultant - 2 wheeled Level of assistance: Modified independence Comments: 2MWT, with RW, antalgic, gait improves with time  TODAY'S TREATMENT: 05/31/22 Reassessment/Progress note Bridge 3x 10  Sidelying hip abduction 2 x 10 RLE Hookying march 2x 10 bilateral  Standing alternating march 2x 10 bilateral  Step up 6 inch 2x 10  Lateral step up 6 inch 1x 10 STS 1x 10   05/28/22 2MWT 210f with RW Rockerboard lateral 2' Heel/toe raise 15x 3D hip excursion (weight shift, rotate, STS standard height no HHA, extension) Toe tapping 6in step height alternating (1 HHA required with Rt LE weight bearing)  Supine:  Hooklying hip abduction isometric with belt 10 x 10 second holds Hooklying hip adduction isometric with ball 10 x 10 second holds    Bridge 2x 10      Sidelying: clam 2 sets 5 reps 5" holds   05/24/22 Supine glute sets 10 x 5-10 second holds Supine heel slides with strap 3x 10 Bridge 2x 10  Supine hip abduction 2x 10  Hooklying hip abduction isometric with belt 10 x 10 second holds Hooklying hip adduction isometric with ball 10 x 10 second holds LAQ 10 x 5 second holds RLE Standing hip abduction 1x 10 bilateral     PATIENT EDUCATION:  Education details:05/31/22: Progress made, HEP; 05/24/22 HEP;  Eval: Patient educated on exam findings, POC, scope of PT, HEP, and frequent mobility but not excessive activity. Person educated: Patient Education method: Explanation, Demonstration, and Handouts Education comprehension: verbalized understanding, returned demonstration, verbal cues required, and tactile cues required    HOME EXERCISE PROGRAM: Access Code: [email or web address] 05/31/22 - Sidelying Hip Abduction  - 1 x daily - 7 x weekly - 2-3 sets - 10 reps - Prone Hip  Extension  - 1 x daily - 7 x weekly - 2-3 sets - 10 reps  Date: 05/20/2022 - Supine Ankle Pumps  - 3 x daily - 7 x weekly - 1 sets -  20 reps - Supine Gluteal Sets  - 3 x daily - 7 x weekly - 10 reps - 5-10 second hold - Bridge  - 3 x daily - 7 x weekly - 2 sets - 10 reps - Supine Heel Slide with Strap (Mirrored)  - 3 x daily - 7 x weekly - 2 sets - 10 reps  ASSESSMENT:  CLINICAL IMPRESSION: Patient has met 2/ 2 short term goals and 0/5 long term goals with ability to complete HEP and improvement in symptoms. Remaining goals not met due to continued symptoms and deficits in strength, activity tolerance, balance, gait, activity tolerance and functional mobility although good progress made in all aspects. Patient overall progressing very well s/p R TKA 05/18/22. Continued with glute strengthening today. Patient able to complete increased height step up today with improving RLE strength. Patient will continue to benefit from physical therapy in order to improve function and reduce impairment.   OBJECTIVE IMPAIRMENTS Abnormal gait, decreased activity tolerance, decreased balance, decreased endurance, decreased mobility, difficulty walking, decreased ROM, decreased strength, increased muscle spasms, impaired flexibility, improper body mechanics, and pain.   ACTIVITY LIMITATIONS carrying, lifting, bending, standing, squatting, sleeping, stairs, transfers, dressing, locomotion level, and caring for others  PARTICIPATION LIMITATIONS: meal prep, cleaning, laundry, shopping, community activity, and yard work  PERSONAL FACTORS Time since onset of injury/illness/exacerbation and 3+ comorbidities: COPD, Fibromyalgia, Anxiety, depression, hx long covid, osteoporosis, HTN, HLD, hx lumbar fusion, spinal cord stimulator  are also affecting patient's functional outcome.   REHAB POTENTIAL: Good  CLINICAL DECISION MAKING: Stable/uncomplicated  EVALUATION COMPLEXITY: Low   GOALS: Goals reviewed with patient?  Yes  SHORT TERM GOALS: Target date: 06/10/2022  Patient will be independent with HEP in order to improve functional outcomes. Baseline:  Goal status: MET  2.  Patient will report at least 25% improvement in symptoms for improved quality of life. Baseline:  Goal status: MET   LONG TERM GOALS: Target date: 07/01/2022  Patient will report at least 75% improvement in symptoms for improved quality of life. Baseline:  Goal status: IN PROGRESS  2.  Patient will improve FOTO score by at least 37 points in order to indicate improved tolerance to activity. Baseline: 7/27 15% function Goal status: IN PROGRESS  3.  Patient will be able to navigate standard stairs with reciprocal pattern without compensation in order to demonstrate improved LE strength. Baseline: 05/20/22: 4 inch stairs with UE support, alternating, with compensations Goal status: IN PROGRESS  4.  Patient will be able to ambulate at least 400 feet in 2MWT in order to demonstrate improved tolerance to activity. Baseline: 200 with RW 05/31/22 300 feet with RW Goal status: IN PROGRESS  5.  Patient will demonstrate grade of 5/5 MMT grade in all tested musculature as evidence of improved strength to assist with stair ambulation and gait.   Baseline: see MMT Goal status: IN PROGRESS   PLAN: PT FREQUENCY: 2x/week  PT DURATION: 6 weeks  PLANNED INTERVENTIONS: Therapeutic exercises, Therapeutic activity, Neuromuscular re-education, Balance training, Gait training, Patient/Family education, Joint manipulation, Joint mobilization, Stair training, Orthotic/Fit training, DME instructions, Aquatic Therapy, Dry Needling, Electrical stimulation, Spinal manipulation, Spinal mobilization, Cryotherapy, Moist heat, Compression bandaging, scar mobilization, Splintting, Taping, Traction, Ultrasound, Ionotophoresis 73m/ml Dexamethasone, and Manual therapy   PLAN FOR NEXT SESSION: f/u with HEP, trial SPC when able , L hip mobility and strength,  progress as tolerated.      AVianne BullsZaunegger, PT 05/31/2022, 12:01 PM

## 2022-06-02 ENCOUNTER — Encounter (HOSPITAL_COMMUNITY): Payer: Medicare Other | Admitting: Physical Therapy

## 2022-06-02 DIAGNOSIS — M1611 Unilateral primary osteoarthritis, right hip: Secondary | ICD-10-CM | POA: Diagnosis not present

## 2022-06-04 ENCOUNTER — Ambulatory Visit (HOSPITAL_COMMUNITY): Payer: Medicare Other

## 2022-06-04 ENCOUNTER — Encounter (HOSPITAL_COMMUNITY): Payer: Self-pay

## 2022-06-04 DIAGNOSIS — R29898 Other symptoms and signs involving the musculoskeletal system: Secondary | ICD-10-CM | POA: Diagnosis not present

## 2022-06-04 DIAGNOSIS — M6281 Muscle weakness (generalized): Secondary | ICD-10-CM | POA: Diagnosis not present

## 2022-06-04 DIAGNOSIS — M25551 Pain in right hip: Secondary | ICD-10-CM | POA: Diagnosis not present

## 2022-06-04 DIAGNOSIS — R2689 Other abnormalities of gait and mobility: Secondary | ICD-10-CM

## 2022-06-04 NOTE — Therapy (Signed)
OUTPATIENT PHYSICAL THERAPY TREATMENT   Patient Name: Alison Brooks MRN: [id number] DOB:02-19-1956, 66 y.o., female Today's Date: 06/04/2022  END OF SESSION:   PT End of Session - 06/04/22 1131     Visit Number 5    Number of Visits 12    Date for PT Re-Evaluation 07/01/22    Authorization Type [employer] Medicare (no visit limit, no pre auth required)    Progress Note Due on Visit 14    PT Start Time 1125    PT Stop Time 1205    PT Time Calculation (min) 40 min    Activity Tolerance Patient tolerated treatment well    Behavior During Therapy WFL for tasks assessed/performed                 Past Medical History:  Diagnosis Date   Anxiety    Arthritis    back, neck   Chronic back pain    COPD (chronic obstructive pulmonary disease) ([place]) 2021   after Covid   Depression    Dyspnea    Since covid , NOW ON INHALERS   Fatty liver    Fibromyalgia    GERD (gastroesophageal reflux disease)    Heart murmur    Hematuria    History of adenomatous polyp of colon    History of COVID-19 10/2020   History of kidney stones    History of seizure    x1 2007 post op (per pt neurologist work-up done ?mixture of anesthesia medication and prozac that pt had taken)  and has not any issues since    History of squamous cell carcinoma excision    Hyperlipidemia    Hypertension    Hypothyroidism    IC (interstitial cystitis)    Long COVID    Migraine    Osteoporosis    Pulmonary nodules    multiple per ct -- monitored by pcp   Right ureteral stone    RLS (restless legs syndrome)    Past Surgical History:  Procedure Laterality Date   CARDIOVASCULAR STRESS TEST  09/02/2004   normal perfusion study/  normal LV function and wall motion, ef 64%   CARPAL TUNNEL RELEASE Right 1998   CERVICAL FUSION  1992   CHOLECYSTECTOMY  1995   COLONOSCOPY     CYSTO/  BOTOX INJECTION  12-27-2008  &  04-01-2006   CYSTO/  HYDRODISTENTION/  INSTILLATION THERAPY  03/04/2006   CYSTOSCOPY W/ URETERAL  STENT PLACEMENT Right 04/02/2016   Procedure: CYSTOSCOPY WITH RIGHT RETROGRADE, URETEROSCOPY PYELOGRAM LASER [place];  Surgeon: Kathie Rhodes, MD;  Location: [facility];  Service: Urology;  Laterality: Right;   HEMORRHOID SURGERY  01/03/2004   HOLMIUM LASER APPLICATION Right 02/63/7858   Procedure: HOLMIUM LASER APPLICATION;  Surgeon: Kathie Rhodes, MD;  Location[facility];  Service: Urology;  Laterality: Right;   LAPAROSCOPIC RIGHT HEMI COLECTOMY N/A 06/12/2021   Procedure: LAPAROSCOPIC RIGHT HEMI COLECTOMY;  Surgeon: Ileana Roup, MD;  Location: [facility];  Service: General;  Laterality: N/A;   LUMBAR FUSION  1996   L4 --S1   ROTATOR CUFF REPAIR Left 2004   SHOULDER ARTHROSCOPY WITH BICEPS TENDON REPAIR Left 12/2015   SPINAL CORD STIMULATOR IMPLANT  x4  last one 2013   STONE EXTRACTION WITH BASKET Right 04/02/2016   Procedure: STONE EXTRACTION WITH BASKET;  Surgeon: Kathie Rhodes, MD;  Location: [facility];  Service: Urology;  Laterality: Right;   TONSILLECTOMY  1964   TOTAL HIP  ARTHROPLASTY Right 05/18/2022   Procedure: TOTAL HIP ARTHROPLASTY ANTERIOR APPROACH;  Surgeon: Renette Butters, MD;  Location: [facility];  Service: Orthopedics;  Laterality: Right;   TRANSTHORACIC ECHOCARDIOGRAM  04/17/2014   EF 60-65%/  trivial AR, MR, and TR   UPPER GASTROINTESTINAL ENDOSCOPY     VAGINAL HYSTERECTOMY  1984   WRIST GANGLION EXCISION Right 02/05/2004   Patient Active Problem List   Diagnosis Date Noted   S/P total right hip arthroplasty 05/18/2022   S/P right hemicolectomy 06/12/2021   Aortic atherosclerosis ([place]) 03/31/2021   COPD (chronic obstructive pulmonary disease) ([place]) 11/24/2020   History of total hysterectomy 07/17/2019   Hypothyroidism 07/17/2019   Vertigo 11/06/2016   Dyspnea 11/02/2014   Upper airway cough syndrome 11/02/2014   Gout 06/28/2014   Vitamin B12 deficiency 06/28/2014   Multiple  pulmonary nodules 06/28/2014   Essential hypertension 05/17/2014   Obesity (BMI 30-39.9) 01/14/2014   Lateral epicondylitis (tennis elbow) 01/02/2014   GERD (gastroesophageal reflux disease) 03/26/2013   LEG CRAMPS 05/16/2009   RESTLESS LEG SYNDROME, SEVERE 03/14/2009   Chronic interstitial cystitis 01/20/2009   Depression, major, recurrent ([place]) 03/19/2008   TUBULOVILLOUS ADENOMA, COLON 02/17/2008   Hyperlipidemia 02/17/2008   NEPHROLITHIASIS 02/17/2008   Former smoker 12/21/2007   MIGRAINE, CLASSICAL W/O INTRACTABLE MIGRAINE 06/09/2007   SYMPTOM, MEMORY LOSS 06/09/2007   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  HEP going well without questions.  Pain scale 4/10, mainly tightness.  Reports increased difficulty going up stairs.  PERTINENT HISTORY: COPD, Fibromyalgia, Anxiety, depression, hx long covid, osteoporosis, HTN, HLD, hx lumbar fusion, spinal cord stimulator  PAIN:  Are you having pain? Yes: NPRS scale: 4/10 Pain location: R hip  Pain description: tight, sore, achey Aggravating factors: movement, walking Relieving factors: Meds  PRECAUTIONS: None  WEIGHT BEARING RESTRICTIONS No  FALLS:  Has patient fallen in last 6 months? Yes. Number of falls 4-5  LIVING ENVIRONMENT: Lives with: lives with their spouse Lives in: House/apartment Stairs: Yes: External: 2 steps; on right going up Has following  equipment at home: Single point cane, Walker - 2 wheeled, and shower chair  OCCUPATION: part time caregiver  PLOF: Independent  PATIENT GOALS Get better   OBJECTIVE: (objective measures from initial evaluation unless otherwise dated)  PATIENT SURVEYS:  FOTO 15% function  COGNITION:  Overall cognitive status: Within functional limits for tasks assessed     SENSATION: WFL   POSTURE: rounded shoulders and forward head  PALPATION: Grossly TTP R hip/quads  LOWER EXTREMITY ROM: WFL for tasks assessed  Active ROM Right eval Left eval  Hip flexion    Hip extension    Hip abduction    Hip adduction    Hip internal rotation    Hip external rotation    Knee flexion    Knee extension    Ankle dorsiflexion    Ankle plantarflexion    Ankle inversion    Ankle eversion     (  Blank rows = not tested)  LOWER EXTREMITY MMT:  MMT Right eval Left eval Right 05/31/22 Left  05/31/22  Hip flexion 3- * 4 3* 4+  Hip extension   3* 4-  Hip abduction   3+* 4+  Hip adduction      Hip internal rotation      Hip external rotation      Knee flexion 4 * 5 4+ 5  Knee extension 4 * 5 4+ 5  Ankle dorsiflexion 4 5 5 5   Ankle plantarflexion      Ankle inversion      Ankle eversion       (Blank rows = not tested) *=pain   FUNCTIONAL TESTS:  5 times sit to stand: n/t 2 minute walk test: 200 feet with RW Stairs: able to complete 4 inch stairs with alternating pattern with UE support, greatest difficulty with R hip flexion Transfers: requires UE support, labored  GAIT: Distance walked: 200 feet Assistive device utilized: Walker - 2 wheeled Level of assistance: Modified independence Comments: 2MWT, with RW, antalgic, gait improves with time; able to ambulate without AD with antalgic gait without UE support and requires increased time  Reassessment 05/31/22 5 times sit to stand: 11.4 seconds, labored without UE use.  2 minute walk test: 300 feet with RW Stairs: able to complete 4 inch  stairs with alternating pattern with UE support, greatest difficulty with R hip flexion Transfers: able to complete without UE support, labored GAIT: Distance walked: 300 feet Assistive device utilized: Walker - 2 wheeled Level of assistance: Modified independence Comments: 2MWT, with RW, antalgic, gait improves with time  TODAY'S TREATMENT: 06/04/22 3D hip excursion (weight shift, rotate, controlled STS, extension) Walking inside // bars with LT UE  SPC 226 step through 2 point sequence Toe tapping alternating 6in step height no HHA 10 each Squat front of chair with initial cueing for mechanics 15x Tandem stance 1x 30" solid surface; 2 x30" (partial tandem on foam intermittent HHA) SLS Lt 19"Rt 18 Vector stance 2x 5" intermittent HHA Step up 6in 10x 2 sets 1HHA Step down 4in 10x  05/31/22 Reassessment/Progress note Bridge 3x 10  Sidelying hip abduction 2 x 10 RLE Hookying march 2x 10 bilateral  Standing alternating march 2x 10 bilateral  Step up 6 inch 2x 10  Lateral step up 6 inch 1x 10 STS 1x 10   05/28/22 2MWT 274f with RW Rockerboard lateral 2' Heel/toe raise 15x 3D hip excursion (weight shift, rotate, STS standard height no HHA, extension) Toe tapping 6in step height alternating (1 HHA required with Rt LE weight bearing)  Supine:  Hooklying hip abduction isometric with belt 10 x 10 second holds Hooklying hip adduction isometric with ball 10 x 10 second holds    Bridge 2x 10      Sidelying: clam 2 sets 5 reps 5" holds   05/24/22 Supine glute sets 10 x 5-10 second holds Supine heel slides with strap 3x 10 Bridge 2x 10  Supine hip abduction 2x 10  Hooklying hip abduction isometric with belt 10 x 10 second holds Hooklying hip adduction isometric with ball 10 x 10 second holds LAQ 10 x 5 second holds RLE Standing hip abduction 1x 10 bilateral     PATIENT EDUCATION:  Education details:05/31/22: Progress made, HEP; 05/24/22 HEP;  Eval: Patient educated on exam  findings, POC, scope of PT, HEP, and frequent mobility but not excessive activity. Person educated: Patient Education method: Explanation, Demonstration, and Handouts Education comprehension: verbalized understanding,  returned demonstration, verbal cues required, and tactile cues required    HOME EXERCISE PROGRAM: Access Code: [email or web address] 05/31/22 - Sidelying Hip Abduction  - 1 x daily - 7 x weekly - 2-3 sets - 10 reps - Prone Hip Extension  - 1 x daily - 7 x weekly - 2-3 sets - 10 reps  Date: 05/20/2022 - Supine Ankle Pumps  - 3 x daily - 7 x weekly - 1 sets - 20 reps - Supine Gluteal Sets  - 3 x daily - 7 x weekly - 10 reps - 5-10 second hold - Bridge  - 3 x daily - 7 x weekly - 2 sets - 10 reps - Supine Heel Slide with Strap (Mirrored)  - 3 x daily - 7 x weekly - 2 sets - 10 reps  ASSESSMENT:  CLINICAL IMPRESSION: Pt progressing well and tolerated well towards session.  Progressed to gait training with LRAD with SPC, pt able to demonstrate good sequence and no LOB.  Encouraged to practice at home and short duration but to continue with RW with dynamic surface and long duration.  Continued with gluteal strengthening exercises and additional balance/stability exercises.  No reports of pain through session.  Reviewed   OBJECTIVE IMPAIRMENTS Abnormal gait, decreased activity tolerance, decreased balance, decreased endurance, decreased mobility, difficulty walking, decreased ROM, decreased strength, increased muscle spasms, impaired flexibility, improper body mechanics, and pain.   ACTIVITY LIMITATIONS carrying, lifting, bending, standing, squatting, sleeping, stairs, transfers, dressing, locomotion level, and caring for others  PARTICIPATION LIMITATIONS: meal prep, cleaning, laundry, shopping, community activity, and yard work  PERSONAL FACTORS Time since onset of injury/illness/exacerbation and 3+ comorbidities: COPD, Fibromyalgia, Anxiety, depression, hx long covid, osteoporosis, HTN, HLD,  hx lumbar fusion, spinal cord stimulator  are also affecting patient's functional outcome.   REHAB POTENTIAL: Good  CLINICAL DECISION MAKING: Stable/uncomplicated  EVALUATION COMPLEXITY: Low   GOALS: Goals reviewed with patient? Yes  SHORT TERM GOALS: Target date: 06/10/2022  Patient will be independent with HEP in order to improve functional outcomes. Baseline:  Goal status: MET  2.  Patient will report at least 25% improvement in symptoms for improved quality of life. Baseline:  Goal status: MET   LONG TERM GOALS: Target date: 07/01/2022  Patient will report at least 75% improvement in symptoms for improved quality of life. Baseline:  Goal status: IN PROGRESS  2.  Patient will improve FOTO score by at least 37 points in order to indicate improved tolerance to activity. Baseline: 7/27 15% function Goal status: IN PROGRESS  3.  Patient will be able to navigate standard stairs with reciprocal pattern without compensation in order to demonstrate improved LE strength. Baseline: 05/20/22: 4 inch stairs with UE support, alternating, with compensations Goal status: IN PROGRESS  4.  Patient will be able to ambulate at least 400 feet in 2MWT in order to demonstrate improved tolerance to activity. Baseline: 200 with RW 05/31/22 300 feet with RW Goal status: IN PROGRESS  5.  Patient will demonstrate grade of 5/5 MMT grade in all tested musculature as evidence of improved strength to assist with stair ambulation and gait.   Baseline: see MMT Goal status: IN PROGRESS   PLAN: PT FREQUENCY: 2x/week  PT DURATION: 6 weeks  PLANNED INTERVENTIONS: Therapeutic exercises, Therapeutic activity, Neuromuscular re-education, Balance training, Gait training, Patient/Family education, Joint manipulation, Joint mobilization, Stair training, Orthotic/Fit training, DME instructions, Aquatic Therapy, Dry Needling, Electrical stimulation, Spinal manipulation, Spinal mobilization, Cryotherapy, Moist  heat, Compression bandaging, scar mobilization, Splintting, Taping, Traction,  Ultrasound, Ionotophoresis 89m/ml Dexamethasone, and Manual therapy   PLAN FOR NEXT SESSION: f/u with HEP, trial SPC when able , L hip mobility and strength, progress as tolerated.  Add sidestep and retro gait next session.   CIhor Austin LPTA/CLT; CBIS [phone] CAldona Lento PTA 06/04/2022, 2:32 PM

## 2022-06-07 ENCOUNTER — Encounter (HOSPITAL_COMMUNITY): Payer: Medicare Other | Admitting: Physical Therapy

## 2022-06-07 DIAGNOSIS — E038 Other specified hypothyroidism: Secondary | ICD-10-CM | POA: Diagnosis not present

## 2022-06-08 ENCOUNTER — Encounter (HOSPITAL_COMMUNITY): Payer: Medicare Other | Admitting: Physical Therapy

## 2022-06-09 ENCOUNTER — Encounter (HOSPITAL_COMMUNITY): Payer: Medicare Other | Admitting: Physical Therapy

## 2022-06-11 ENCOUNTER — Encounter (HOSPITAL_COMMUNITY): Payer: Medicare Other

## 2022-06-14 ENCOUNTER — Encounter (HOSPITAL_COMMUNITY): Payer: Medicare Other | Admitting: Physical Therapy

## 2022-06-16 ENCOUNTER — Encounter (HOSPITAL_COMMUNITY): Payer: Medicare Other

## 2022-06-18 ENCOUNTER — Ambulatory Visit (HOSPITAL_COMMUNITY): Payer: Medicare Other

## 2022-06-18 ENCOUNTER — Encounter (HOSPITAL_COMMUNITY): Payer: Self-pay

## 2022-06-18 DIAGNOSIS — M25551 Pain in right hip: Secondary | ICD-10-CM | POA: Diagnosis not present

## 2022-06-18 DIAGNOSIS — R29898 Other symptoms and signs involving the musculoskeletal system: Secondary | ICD-10-CM | POA: Diagnosis not present

## 2022-06-18 DIAGNOSIS — R2689 Other abnormalities of gait and mobility: Secondary | ICD-10-CM

## 2022-06-18 DIAGNOSIS — M6281 Muscle weakness (generalized): Secondary | ICD-10-CM

## 2022-06-18 NOTE — Therapy (Signed)
OUTPATIENT PHYSICAL THERAPY TREATMENT   Patient Name: Alison Brooks MRN: [id number] DOB:1956/05/16, 66 y.o., female Today's Date: 06/18/2022  END OF SESSION:   PT End of Session - 06/18/22 1205     Visit Number 6    Number of Visits 12    Date for PT Re-Evaluation 07/01/22    Authorization Type [employer] Medicare (no visit limit, no pre auth required)    Progress Note Due on Visit 14    PT Start Time 1120    PT Stop Time 1200    PT Time Calculation (min) 40 min    Activity Tolerance Patient tolerated treatment well    Behavior During Therapy WFL for tasks assessed/performed                  Past Medical History:  Diagnosis Date   Anxiety    Arthritis    back, neck   Chronic back pain    COPD (chronic obstructive pulmonary disease) ([place]) 2021   after Covid   Depression    Dyspnea    Since covid , NOW ON INHALERS   Fatty liver    Fibromyalgia    GERD (gastroesophageal reflux disease)    Heart murmur    Hematuria    History of adenomatous polyp of colon    History of COVID-19 10/2020   History of kidney stones    History of seizure    x1 2007 post op (per pt neurologist work-up done ?mixture of anesthesia medication and prozac that pt had taken)  and has not any issues since    History of squamous cell carcinoma excision    Hyperlipidemia    Hypertension    Hypothyroidism    IC (interstitial cystitis)    Long COVID    Migraine    Osteoporosis    Pulmonary nodules    multiple per ct -- monitored by pcp   Right ureteral stone    RLS (restless legs syndrome)    Past Surgical History:  Procedure Laterality Date   CARDIOVASCULAR STRESS TEST  09/02/2004   normal perfusion study/  normal LV function and wall motion, ef 64%   CARPAL TUNNEL RELEASE Right 1998   CERVICAL FUSION  1992   CHOLECYSTECTOMY  1995   COLONOSCOPY     CYSTO/  BOTOX INJECTION  12-27-2008  &  04-01-2006   CYSTO/  HYDRODISTENTION/  INSTILLATION THERAPY  03/04/2006   CYSTOSCOPY W/  URETERAL STENT PLACEMENT Right 04/02/2016   Procedure: CYSTOSCOPY WITH RIGHT RETROGRADE, URETEROSCOPY PYELOGRAM LASER [place];  Surgeon: Kathie Rhodes, MD;  Location: [facility];  Service: Urology;  Laterality: Right;   HEMORRHOID SURGERY  01/03/2004   HOLMIUM LASER APPLICATION Right 56/21/3086   Procedure: HOLMIUM LASER APPLICATION;  Surgeon: Kathie Rhodes, MD;  Location: [facility];  Service: Urology;  Laterality: Right;   LAPAROSCOPIC RIGHT HEMI COLECTOMY N/A 06/12/2021   Procedure: LAPAROSCOPIC RIGHT HEMI COLECTOMY;  Surgeon: Ileana Roup, MD;  Location: [facility];  Service: General;  Laterality: N/A;   LUMBAR FUSION  1996   L4 --S1   ROTATOR CUFF REPAIR Left 2004   SHOULDER ARTHROSCOPY WITH BICEPS TENDON REPAIR Left 12/2015   SPINAL CORD STIMULATOR IMPLANT  x4  last one 2013   STONE EXTRACTION WITH BASKET Right 04/02/2016   Procedure: STONE EXTRACTION WITH BASKET;  Surgeon: Kathie Rhodes, MD;  Location: [facility];  Service: Urology;  Laterality: Right;   TONSILLECTOMY  1964   TOTAL  HIP ARTHROPLASTY Right 05/18/2022   Procedure: TOTAL HIP ARTHROPLASTY ANTERIOR APPROACH;  Surgeon: Renette Butters, MD;  Location: [facility];  Service: Orthopedics;  Laterality: Right;   TRANSTHORACIC ECHOCARDIOGRAM  04/17/2014   EF 60-65%/  trivial AR, MR, and TR   UPPER GASTROINTESTINAL ENDOSCOPY     VAGINAL HYSTERECTOMY  1984   WRIST GANGLION EXCISION Right 02/05/2004   Patient Active Problem List   Diagnosis Date Noted   S/P total right hip arthroplasty 05/18/2022   S/P right hemicolectomy 06/12/2021   Aortic atherosclerosis ([place]) 03/31/2021   COPD (chronic obstructive pulmonary disease) ([place]) 11/24/2020   History of total hysterectomy 07/17/2019   Hypothyroidism 07/17/2019   Vertigo 11/06/2016   Dyspnea 11/02/2014   Upper airway cough syndrome 11/02/2014   Gout 06/28/2014   Vitamin B12 deficiency 06/28/2014    Multiple pulmonary nodules 06/28/2014   Essential hypertension 05/17/2014   Obesity (BMI 30-39.9) 01/14/2014   Lateral epicondylitis (tennis elbow) 01/02/2014   GERD (gastroesophageal reflux disease) 03/26/2013   LEG CRAMPS 05/16/2009   RESTLESS LEG SYNDROME, SEVERE 03/14/2009   Chronic interstitial cystitis 01/20/2009   Depression, major, recurrent ([place]) 03/19/2008   TUBULOVILLOUS ADENOMA, COLON 02/17/2008   Hyperlipidemia 02/17/2008   NEPHROLITHIASIS 02/17/2008   Former smoker 12/21/2007   MIGRAINE, CLASSICAL W/O INTRACTABLE MIGRAINE 06/09/2007   SYMPTOM, MEMORY LOSS 06/09/2007   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  Stated she has continued with exercises at home, has not practiced with cane at home.  Reports swelling in hip and knee.    PERTINENT HISTORY: COPD, Fibromyalgia, Anxiety, depression, hx long covid, osteoporosis, HTN, HLD, hx lumbar fusion, spinal cord stimulator  PAIN:  Are you having pain? Yes: NPRS scale: 3/10 Pain location: R hip  Pain description: tight, sore, achey Aggravating factors: movement, walking Relieving factors: Meds  PRECAUTIONS: None  WEIGHT BEARING RESTRICTIONS No  FALLS:  Has patient fallen in last 6 months? Yes. Number of falls 4-5  LIVING ENVIRONMENT: Lives with: lives with their spouse Lives in: House/apartment Stairs: Yes: External: 2 steps; on right going up Has following equipment at home:  Single point cane, Walker - 2 wheeled, and shower chair  OCCUPATION: part time caregiver  PLOF: Independent  PATIENT GOALS Get better   OBJECTIVE: (objective measures from initial evaluation unless otherwise dated)  PATIENT SURVEYS:  FOTO 15% function  COGNITION:  Overall cognitive status: Within functional limits for tasks assessed     SENSATION: WFL   POSTURE: rounded shoulders and forward head  PALPATION: Grossly TTP R hip/quads  LOWER EXTREMITY ROM: WFL for tasks assessed  Active ROM Right eval Left eval  Hip flexion    Hip extension    Hip abduction    Hip adduction    Hip internal rotation    Hip external rotation    Knee flexion    Knee extension    Ankle dorsiflexion    Ankle plantarflexion    Ankle inversion    Ankle eversion     (  Blank rows = not tested)  LOWER EXTREMITY MMT:  MMT Right eval Left eval Right 05/31/22 Left  05/31/22  Hip flexion 3- * 4 3* 4+  Hip extension   3* 4-  Hip abduction   3+* 4+  Hip adduction      Hip internal rotation      Hip external rotation      Knee flexion 4 * 5 4+ 5  Knee extension 4 * 5 4+ 5  Ankle dorsiflexion _0 Ankle plantarflexion      Ankle inversion      Ankle eversion       (Blank rows = not tested) *=pain   FUNCTIONAL TESTS:  5 times sit to stand: n/t 2 minute walk test: 200 feet with RW Stairs: able to complete 4 inch stairs with alternating pattern with UE support, greatest difficulty with R hip flexion Transfers: requires UE support, labored  GAIT: Distance walked: 200 feet Assistive device utilized: Walker - 2 wheeled Level of assistance: Modified independence Comments: 2MWT, with RW, antalgic, gait improves with time; able to ambulate without AD with antalgic gait without UE support and requires increased time  Reassessment 05/31/22 5 times sit to stand: 11.4 seconds, labored without UE use.  2 minute walk test: 300 feet with RW Stairs: able to complete 4 inch stairs with  alternating pattern with UE support, greatest difficulty with R hip flexion Transfers: able to complete without UE support, labored GAIT: Distance walked: 300 feet Assistive device utilized: Walker - 2 wheeled Level of assistance: Modified independence Comments: 2MWT, with RW, antalgic, gait improves with time  TODAY'S TREATMENT:  06/18/22 Nustep L1 resistance 2 x 5 min 2MWT with SPC 267f good mechanics Rockerboard lateral and DF/PF 1' each 3D hip excursin 10x  Squat 2x 10 (cueing for equal weight bearing Vector stance 3x 5" 1 HHA SLS Rt 20", Lt 33" Sidestep down blue line 1RT [employer] retro and sidestep (SBA for safety) 2PL 5RT Reciprocal pattern 7in ascend 4in descend 3RT    06/04/22 3D hip excursion (weight shift, rotate, controlled STS, extension) Walking inside // bars with LT UE  SPC 226 step through 2 point sequence Toe tapping alternating 6in step height no HHA 10 each Squat front of chair with initial cueing for mechanics 15x Tandem stance 1x 30" solid surface; 2 x30" (partial tandem on foam intermittent HHA) SLS Lt 19"Rt 18 Vector stance 2x 5" intermittent HHA Step up 6in 10x 2 sets 1HHA Step down 4in 10x  05/31/22 Reassessment/Progress note Bridge 3x 10  Sidelying hip abduction 2 x 10 RLE Hookying march 2x 10 bilateral  Standing alternating march 2x 10 bilateral  Step up 6 inch 2x 10  Lateral step up 6 inch 1x 10 STS 1x 10   05/28/22 2MWT 2726fwith RW Rockerboard lateral 2' Heel/toe raise 15x 3D hip excursion (weight shift, rotate, STS standard height no HHA, extension) Toe tapping 6in step height alternating (1 HHA required with Rt LE weight bearing)  Supine:  Hooklying hip abduction isometric with belt 10 x 10 second holds Hooklying hip adduction isometric with ball 10 x 10 second holds    Bridge 2x 10      Sidelying: clam 2 sets 5 reps 5" holds   05/24/22 Supine glute sets 10 x 5-10 second holds Supine heel slides with strap 3x 10 Bridge 2x 10   Supine hip abduction 2x 10  Hooklying hip abduction isometric with belt 10 x 10 second holds Hooklying  hip adduction isometric with ball 10 x 10 second holds LAQ 10 x 5 second holds RLE Standing hip abduction 1x 10 bilateral     PATIENT EDUCATION:  Education details:05/31/22: Progress made, HEP; 05/24/22 HEP;  Eval: Patient educated on exam findings, POC, scope of PT, HEP, and frequent mobility but not excessive activity. Person educated: Patient Education method: Explanation, Demonstration, and Handouts Education comprehension: verbalized understanding, returned demonstration, verbal cues required, and tactile cues required    HOME EXERCISE PROGRAM: Access Code: [email or web address] 05/31/22 - Sidelying Hip Abduction  - 1 x daily - 7 x weekly - 2-3 sets - 10 reps - Prone Hip Extension  - 1 x daily - 7 x weekly - 2-3 sets - 10 reps  Date: 05/20/2022 - Supine Ankle Pumps  - 3 x daily - 7 x weekly - 1 sets - 20 reps - Supine Gluteal Sets  - 3 x daily - 7 x weekly - 10 reps - 5-10 second hold - Bridge  - 3 x daily - 7 x weekly - 2 sets - 10 reps - Supine Heel Slide with Strap (Mirrored)  - 3 x daily - 7 x weekly - 2 sets - 10 reps  ASSESSMENT:  CLINICAL IMPRESSION: Pt progressing well towards POC.  Good gait mechanics with SPC and no LOB.  Improved BLE SLS.  Added [employer] retro and sidestep with SBA for balance and gluteal strengthening.  Began reciprocal pattern stairs for functional strengthening with good mechanics.  Pt reports pain reduced at EOS.  OBJECTIVE IMPAIRMENTS Abnormal gait, decreased activity tolerance, decreased balance, decreased endurance, decreased mobility, difficulty walking, decreased ROM, decreased strength, increased muscle spasms, impaired flexibility, improper body mechanics, and pain.   ACTIVITY LIMITATIONS carrying, lifting, bending, standing, squatting, sleeping, stairs, transfers, dressing, locomotion level, and caring for others  PARTICIPATION LIMITATIONS: meal  prep, cleaning, laundry, shopping, community activity, and yard work  PERSONAL FACTORS Time since onset of injury/illness/exacerbation and 3+ comorbidities: COPD, Fibromyalgia, Anxiety, depression, hx long covid, osteoporosis, HTN, HLD, hx lumbar fusion, spinal cord stimulator  are also affecting patient's functional outcome.   REHAB POTENTIAL: Good  CLINICAL DECISION MAKING: Stable/uncomplicated  EVALUATION COMPLEXITY: Low   GOALS: Goals reviewed with patient? Yes  SHORT TERM GOALS: Target date: 06/10/2022  Patient will be independent with HEP in order to improve functional outcomes. Baseline:  Goal status: MET  2.  Patient will report at least 25% improvement in symptoms for improved quality of life. Baseline:  Goal status: MET   LONG TERM GOALS: Target date: 07/01/2022  Patient will report at least 75% improvement in symptoms for improved quality of life. Baseline:  Goal status: IN PROGRESS  2.  Patient will improve FOTO score by at least 37 points in order to indicate improved tolerance to activity. Baseline: 7/27 15% function Goal status: IN PROGRESS  3.  Patient will be able to navigate standard stairs with reciprocal pattern without compensation in order to demonstrate improved LE strength. Baseline: 05/20/22: 4 inch stairs with UE support, alternating, with compensations Goal status: IN PROGRESS  4.  Patient will be able to ambulate at least 400 feet in 2MWT in order to demonstrate improved tolerance to activity. Baseline: 200 with RW 05/31/22 300 feet with RW Goal status: IN PROGRESS  5.  Patient will demonstrate grade of 5/5 MMT grade in all tested musculature as evidence of improved strength to assist with stair ambulation and gait.   Baseline: see MMT Goal status: IN PROGRESS   PLAN: PT FREQUENCY:  2x/week  PT DURATION: 6 weeks  PLANNED INTERVENTIONS: Therapeutic exercises, Therapeutic activity, Neuromuscular re-education, Balance training, Gait training,  Patient/Family education, Joint manipulation, Joint mobilization, Stair training, Orthotic/Fit training, DME instructions, Aquatic Therapy, Dry Needling, Electrical stimulation, Spinal manipulation, Spinal mobilization, Cryotherapy, Moist heat, Compression bandaging, scar mobilization, Splintting, Taping, Traction, Ultrasound, Ionotophoresis 5m/ml Dexamethasone, and Manual therapy   PLAN FOR NEXT SESSION: SPC  L hip mobility and strength, progress as tolerated.     CIhor Austin LPTA/CLT; CBIS [phone] CAldona Lento PTA 06/18/2022, 12:47 PM

## 2022-06-21 ENCOUNTER — Encounter (HOSPITAL_COMMUNITY): Payer: Self-pay | Admitting: Physical Therapy

## 2022-06-21 ENCOUNTER — Ambulatory Visit (HOSPITAL_COMMUNITY): Payer: Medicare Other | Admitting: Physical Therapy

## 2022-06-21 DIAGNOSIS — R29898 Other symptoms and signs involving the musculoskeletal system: Secondary | ICD-10-CM | POA: Diagnosis not present

## 2022-06-21 DIAGNOSIS — M6281 Muscle weakness (generalized): Secondary | ICD-10-CM

## 2022-06-21 DIAGNOSIS — M25551 Pain in right hip: Secondary | ICD-10-CM

## 2022-06-21 DIAGNOSIS — R2689 Other abnormalities of gait and mobility: Secondary | ICD-10-CM

## 2022-06-21 NOTE — Therapy (Signed)
OUTPATIENT PHYSICAL THERAPY TREATMENT   Patient Name: Alison Brooks MRN: [id number] DOB:11-26-1955, 66 y.o., female Today's Date: 06/21/2022  END OF SESSION:   PT End of Session - 06/21/22 1106     Visit Number 7    Number of Visits 12    Date for PT Re-Evaluation 07/01/22    Authorization Type [employer] Medicare (no visit limit, no pre auth required)    Progress Note Due on Visit 14    PT Start Time 1110    PT Stop Time 1150    PT Time Calculation (min) 40 min    Activity Tolerance Patient tolerated treatment well    Behavior During Therapy WFL for tasks assessed/performed                  Past Medical History:  Diagnosis Date   Anxiety    Arthritis    back, neck   Chronic back pain    COPD (chronic obstructive pulmonary disease) ([place]) 2021   after Covid   Depression    Dyspnea    Since covid , NOW ON INHALERS   Fatty liver    Fibromyalgia    GERD (gastroesophageal reflux disease)    Heart murmur    Hematuria    History of adenomatous polyp of colon    History of COVID-19 10/2020   History of kidney stones    History of seizure    x1 2007 post op (per pt neurologist work-up done ?mixture of anesthesia medication and prozac that pt had taken)  and has not any issues since    History of squamous cell carcinoma excision    Hyperlipidemia    Hypertension    Hypothyroidism    IC (interstitial cystitis)    Long COVID    Migraine    Osteoporosis    Pulmonary nodules    multiple per ct -- monitored by pcp   Right ureteral stone    RLS (restless legs syndrome)    Past Surgical History:  Procedure Laterality Date   CARDIOVASCULAR STRESS TEST  09/02/2004   normal perfusion study/  normal LV function and wall motion, ef 64%   CARPAL TUNNEL RELEASE Right 1998   CERVICAL FUSION  1992   CHOLECYSTECTOMY  1995   COLONOSCOPY     CYSTO/  BOTOX INJECTION  12-27-2008  &  04-01-2006   CYSTO/  HYDRODISTENTION/  INSTILLATION THERAPY  03/04/2006   CYSTOSCOPY W/  URETERAL STENT PLACEMENT Right 04/02/2016   Procedure: CYSTOSCOPY WITH RIGHT RETROGRADE, URETEROSCOPY PYELOGRAM LASER [place];  Surgeon: Kathie Rhodes, MD;  Location: [place];  Service: Urology;  Laterality: Right;   HEMORRHOID SURGERY  01/03/2004   HOLMIUM LASER APPLICATION Right 61/60/7371   Procedure: HOLMIUM LASER APPLICATION;  Surgeon: Kathie Rhodes, MD;  Location[facility];  Service: Urology;  Laterality: Right;   LAPAROSCOPIC RIGHT HEMI COLECTOMY N/A 06/12/2021   Procedure: LAPAROSCOPIC RIGHT HEMI COLECTOMY;  Surgeon: Ileana Roup, MD;  Location: [facility];  Service: General;  Laterality: N/A;   LUMBAR FUSION  1996   L4 --S1   ROTATOR CUFF REPAIR Left 2004   SHOULDER ARTHROSCOPY WITH BICEPS TENDON REPAIR Left 12/2015   SPINAL CORD STIMULATOR IMPLANT  x4  last one 2013   STONE EXTRACTION WITH BASKET Right 04/02/2016   Procedure: STONE EXTRACTION WITH BASKET;  Surgeon: Kathie Rhodes, MD;  Location: [facility];  Service: Urology;  Laterality: Right;   TONSILLECTOMY  1964   TOTAL  HIP ARTHROPLASTY Right 05/18/2022   Procedure: TOTAL HIP ARTHROPLASTY ANTERIOR APPROACH;  Surgeon: Renette Butters, MD;  Location: [facility];  Service: Orthopedics;  Laterality: Right;   TRANSTHORACIC ECHOCARDIOGRAM  04/17/2014   EF 60-65%/  trivial AR, MR, and TR   UPPER GASTROINTESTINAL ENDOSCOPY     VAGINAL HYSTERECTOMY  1984   WRIST GANGLION EXCISION Right 02/05/2004   Patient Active Problem List   Diagnosis Date Noted   S/P total right hip arthroplasty 05/18/2022   S/P right hemicolectomy 06/12/2021   Aortic atherosclerosis ([place]) 03/31/2021   COPD (chronic obstructive pulmonary disease) ([place]) 11/24/2020   History of total hysterectomy 07/17/2019   Hypothyroidism 07/17/2019   Vertigo 11/06/2016   Dyspnea 11/02/2014   Upper airway cough syndrome 11/02/2014   Gout 06/28/2014   Vitamin B12 deficiency 06/28/2014    Multiple pulmonary nodules 06/28/2014   Essential hypertension 05/17/2014   Obesity (BMI 30-39.9) 01/14/2014   Lateral epicondylitis (tennis elbow) 01/02/2014   GERD (gastroesophageal reflux disease) 03/26/2013   LEG CRAMPS 05/16/2009   RESTLESS LEG SYNDROME, SEVERE 03/14/2009   Chronic interstitial cystitis 01/20/2009   Depression, major, recurrent ([place]) 03/19/2008   TUBULOVILLOUS ADENOMA, COLON 02/17/2008   Hyperlipidemia 02/17/2008   NEPHROLITHIASIS 02/17/2008   Former smoker 12/21/2007   MIGRAINE, CLASSICAL W/O INTRACTABLE MIGRAINE 06/09/2007   SYMPTOM, MEMORY LOSS 06/09/2007   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 Her back back is bothering her.   PERTINENT HISTORY: COPD, Fibromyalgia, Anxiety, depression, hx long covid, osteoporosis, HTN, HLD, hx lumbar fusion, spinal cord stimulator  PAIN:  Are you having pain? Yes: NPRS scale: 2/10 Pain location: R hip  Pain description: tight, sore, achey Aggravating factors: movement, walking Relieving factors: Meds  PRECAUTIONS: None  WEIGHT BEARING RESTRICTIONS No  FALLS:  Has patient fallen in last 6 months? Yes. Number of falls 4-5  LIVING ENVIRONMENT: Lives with: lives with their spouse Lives in: House/apartment Stairs: Yes: External: 2 steps; on right going up Has following equipment at home: Single point cane, Walker - 2 wheeled, and shower chair  OCCUPATION: part time caregiver  PLOF:  Independent  PATIENT GOALS Get better   OBJECTIVE: (objective measures from initial evaluation unless otherwise dated)  PATIENT SURVEYS:  FOTO 15% function  COGNITION:  Overall cognitive status: Within functional limits for tasks assessed     SENSATION: WFL   POSTURE: rounded shoulders and forward head  PALPATION: Grossly TTP R hip/quads  LOWER EXTREMITY ROM: WFL for tasks assessed  Active ROM Right eval Left eval  Hip flexion    Hip extension    Hip abduction    Hip adduction    Hip internal rotation    Hip external rotation    Knee flexion    Knee extension    Ankle dorsiflexion    Ankle plantarflexion    Ankle inversion    Ankle eversion     (Blank rows = not tested)  LOWER EXTREMITY MMT:  MMT Right eval Left eval Right 05/31/22 Left  05/31/22  Hip flexion 3- * 4 3* 4+  Hip extension   3* 4-  Hip abduction   3+* 4+  Hip adduction      Hip internal rotation      Hip external rotation      Knee flexion 4 * 5 4+ 5  Knee extension 4 * 5 4+ 5  Ankle dorsiflexion 4 5 5 5   Ankle plantarflexion      Ankle inversion      Ankle eversion       (Blank rows = not tested) *=pain   FUNCTIONAL TESTS:  5 times sit to stand: n/t 2 minute walk test: 200 feet with RW Stairs: able to complete 4 inch stairs with alternating pattern with UE support, greatest difficulty with R hip flexion Transfers: requires UE support, labored  GAIT: Distance walked: 200 feet Assistive device utilized: Walker - 2 wheeled Level of assistance: Modified independence Comments: 2MWT, with RW, antalgic, gait improves with time; able to ambulate without AD with antalgic gait without UE support and requires increased time  Reassessment 05/31/22 5 times sit to stand: 11.4 seconds, labored without UE use.  2 minute walk test: 300 feet with RW Stairs: able to complete 4 inch stairs with alternating pattern with UE support, greatest difficulty with R hip flexion Transfers: able to complete  without UE support, labored GAIT: Distance walked: 300 feet Assistive device utilized: Walker - 2 wheeled Level of assistance: Modified independence Comments: 2MWT, with RW, antalgic, gait improves with time  TODAY'S TREATMENT: 06/21/22 Alternating march 2x 20 bilateral SLS with vectors 5 x 5 second holds bilateral  Standing hip abduction with GTB at knees 2x 10 bilateral Standing lumbar extension 1x 10 - no change in symptoms Supine LTR 2x 10 bilateral  Bridge 2x 10  Prone hip extension 2x 10 bilateral  Sidelying hip abduction 1 x15 RLE Step up 6 inch 2x 10 RLE Lateral step up 6 inch 2x 10 RLE Lateral stepping 6x 15 feet Nustep Level 1, seat 4, 5 minutes   06/18/22 Nustep L1 resistance 2 x 5 min 2MWT with SPC 253f good mechanics Rockerboard lateral and DF/PF 1' each 3D hip excursin 10x  Squat 2x 10 (cueing for equal weight bearing Vector stance 3x 5" 1 HHA SLS Rt 20", Lt 33" Sidestep down blue line 1RT [employer] retro and sidestep (SBA for safety) 2PL 5RT Reciprocal pattern 7in ascend 4in descend 3RT    06/04/22 3D hip excursion (weight shift, rotate, controlled STS, extension) Walking inside // bars with LT UE  SPC 226 step through 2 point sequence Toe tapping alternating 6in step height no HHA 10 each Squat front of chair with initial cueing for mechanics 15x Tandem stance 1x 30" solid surface; 2 x30" (partial tandem on foam intermittent HHA) SLS Lt 19"Rt 18 Vector stance 2x 5" intermittent HHA Step up 6in 10x 2 sets 1HHA Step down 4in 10x  05/31/22 Reassessment/Progress note Bridge 3x 10  Sidelying hip abduction 2 x 10 RLE Hookying march 2x 10 bilateral  Standing alternating march 2x 10 bilateral  Step up 6 inch 2x 10  Lateral step up 6 inch 1x 10 STS 1x 10   PATIENT EDUCATION:  Education details:06/21/22 HEP; 05/31/22: Progress made, HEP; 05/24/22 HEP;  Eval: Patient educated on exam findings, POC, scope of PT, HEP, and frequent mobility but not excessive  activity. Person educated: Patient Education method: Explanation, Demonstration, and Handouts Education comprehension: verbalized understanding, returned demonstration, verbal cues required, and tactile cues required  HOME EXERCISE PROGRAM: Access Code: [email or web address] 05/31/22 - Sidelying Hip Abduction  - 1 x daily - 7 x weekly - 2-3 sets - 10 reps - Prone Hip Extension  - 1 x daily - 7 x weekly - 2-3 sets - 10 reps  Date: 05/20/2022 - Supine Ankle Pumps  - 3 x daily - 7 x weekly - 1 sets - 20 reps - Supine Gluteal Sets  - 3 x daily - 7 x weekly - 10 reps - 5-10 second hold - Bridge  - 3 x daily - 7 x weekly - 2 sets - 10 reps - Supine Heel Slide with Strap (Mirrored)  - 3 x daily - 7 x weekly - 2 sets - 10 reps  ASSESSMENT:  CLINICAL IMPRESSION: Patient ambulating with SPC with continued antalgic gait on RLE. She demonstrates improving balance with vectors ability to perform with intermittent UE support only. Patient with increased LBP today which limites hip progressions. Attempted standing lumbar extension with no change in symptoms. Transitioned to supine stretches/exercises which are tolerated well with mild decrease in symptoms. Moderate fatigue at EOS. Patient will continue to benefit from physical therapy in order to improve function and reduce impairment.   OBJECTIVE IMPAIRMENTS Abnormal gait, decreased activity tolerance, decreased balance, decreased endurance, decreased mobility, difficulty walking, decreased ROM, decreased strength, increased muscle spasms, impaired flexibility, improper body mechanics, and pain.   ACTIVITY LIMITATIONS carrying, lifting, bending, standing, squatting, sleeping, stairs, transfers, dressing, locomotion level, and caring for others  PARTICIPATION LIMITATIONS: meal prep, cleaning, laundry, shopping, community activity, and yard work  PERSONAL FACTORS Time since onset of injury/illness/exacerbation and 3+ comorbidities: COPD, Fibromyalgia, Anxiety,  depression, hx long covid, osteoporosis, HTN, HLD, hx lumbar fusion, spinal cord stimulator  are also affecting patient's functional outcome.   REHAB POTENTIAL: Good  CLINICAL DECISION MAKING: Stable/uncomplicated  EVALUATION COMPLEXITY: Low   GOALS: Goals reviewed with patient? Yes  SHORT TERM GOALS: Target date: 06/10/2022  Patient will be independent with HEP in order to improve functional outcomes. Baseline:  Goal status: MET  2.  Patient will report at least 25% improvement in symptoms for improved quality of life. Baseline:  Goal status: MET   LONG TERM GOALS: Target date: 07/01/2022  Patient will report at least 75% improvement in symptoms for improved quality of life. Baseline:  Goal status: IN PROGRESS  2.  Patient will improve FOTO score by at least 37 points in order to indicate improved tolerance to activity. Baseline: 7/27 15% function Goal status: IN PROGRESS  3.  Patient will be able to navigate standard stairs with reciprocal pattern without compensation in order to demonstrate improved LE strength. Baseline: 05/20/22: 4 inch stairs with UE support, alternating, with compensations Goal status: IN PROGRESS  4.  Patient will be able to ambulate at least 400 feet in 2MWT in order to demonstrate improved tolerance to activity. Baseline: 200 with RW 05/31/22 300 feet with RW Goal status: IN PROGRESS  5.  Patient will demonstrate grade of 5/5 MMT grade in all tested musculature as evidence of improved strength to assist with stair ambulation and gait.   Baseline: see MMT Goal status: IN PROGRESS   PLAN: PT FREQUENCY: 2x/week  PT DURATION: 6 weeks  PLANNED INTERVENTIONS: Therapeutic exercises, Therapeutic activity, Neuromuscular re-education, Balance training, Gait training, Patient/Family education, Joint manipulation, Joint mobilization, Stair training, Orthotic/Fit training, DME instructions, Aquatic Therapy, Dry Needling, Electrical stimulation, Spinal  manipulation, Spinal mobilization, Cryotherapy, Moist heat, Compression bandaging, scar mobilization, Splintting, Taping, Traction, Ultrasound, Ionotophoresis  75m/ml Dexamethasone, and Manual therapy   PLAN FOR NEXT SESSION: SPC  L hip mobility and strength, progress as tolerated.       AVianne BullsZaunegger, PT 06/21/2022, 11:45 AM

## 2022-06-23 ENCOUNTER — Encounter (HOSPITAL_COMMUNITY): Payer: Medicare Other | Admitting: Physical Therapy

## 2022-06-25 ENCOUNTER — Ambulatory Visit (HOSPITAL_COMMUNITY): Payer: Medicare Other | Attending: Orthopedic Surgery

## 2022-06-25 DIAGNOSIS — R279 Unspecified lack of coordination: Secondary | ICD-10-CM | POA: Insufficient documentation

## 2022-06-25 DIAGNOSIS — M25551 Pain in right hip: Secondary | ICD-10-CM | POA: Insufficient documentation

## 2022-06-25 DIAGNOSIS — R29898 Other symptoms and signs involving the musculoskeletal system: Secondary | ICD-10-CM | POA: Insufficient documentation

## 2022-06-25 DIAGNOSIS — M6281 Muscle weakness (generalized): Secondary | ICD-10-CM | POA: Diagnosis not present

## 2022-06-25 DIAGNOSIS — R2689 Other abnormalities of gait and mobility: Secondary | ICD-10-CM | POA: Insufficient documentation

## 2022-06-25 NOTE — Therapy (Signed)
OUTPATIENT PHYSICAL THERAPY TREATMENT   Patient Name: Alison Brooks MRN: [id number] DOB:08-10-1956, 66 y.o., female Today's Date: 06/25/2022  END OF SESSION:   PT End of Session - 06/25/22 1118     Visit Number 8    Number of Visits 12    Date for PT Re-Evaluation 07/01/22    Authorization Type [employer] Medicare (no visit limit, no pre auth required)    Progress Note Due on Visit 14    PT Start Time 1118    PT Stop Time 1200    PT Time Calculation (min) 42 min    Activity Tolerance Patient tolerated treatment well    Behavior During Therapy WFL for tasks assessed/performed                   Past Medical History:  Diagnosis Date   Anxiety    Arthritis    back, neck   Chronic back pain    COPD (chronic obstructive pulmonary disease) ([place]) 2021   after Covid   Depression    Dyspnea    Since covid , NOW ON INHALERS   Fatty liver    Fibromyalgia    GERD (gastroesophageal reflux disease)    Heart murmur    Hematuria    History of adenomatous polyp of colon    History of COVID-19 10/2020   History of kidney stones    History of seizure    x1 2007 post op (per pt neurologist work-up done ?mixture of anesthesia medication and prozac that pt had taken)  and has not any issues since    History of squamous cell carcinoma excision    Hyperlipidemia    Hypertension    Hypothyroidism    IC (interstitial cystitis)    Long COVID    Migraine    Osteoporosis    Pulmonary nodules    multiple per ct -- monitored by pcp   Right ureteral stone    RLS (restless legs syndrome)    Past Surgical History:  Procedure Laterality Date   CARDIOVASCULAR STRESS TEST  09/02/2004   normal perfusion study/  normal LV function and wall motion, ef 64%   CARPAL TUNNEL RELEASE Right 1998   CERVICAL FUSION  1992   CHOLECYSTECTOMY  1995   COLONOSCOPY     CYSTO/  BOTOX INJECTION  12-27-2008  &  04-01-2006   CYSTO/  HYDRODISTENTION/  INSTILLATION THERAPY  03/04/2006   CYSTOSCOPY W/  URETERAL STENT PLACEMENT Right 04/02/2016   Procedure: CYSTOSCOPY WITH RIGHT RETROGRADE, URETEROSCOPY PYELOGRAM LASER [place];  Surgeon: Kathie Rhodes, MD;  Location: [facility];  Service: Urology;  Laterality: Right;   HEMORRHOID SURGERY  01/03/2004   HOLMIUM LASER APPLICATION Right 43/32/9518   Procedure: HOLMIUM LASER APPLICATION;  Surgeon: Kathie Rhodes, MD;  Location: [facility];  Service: Urology;  Laterality: Right;   LAPAROSCOPIC RIGHT HEMI COLECTOMY N/A 06/12/2021   Procedure: LAPAROSCOPIC RIGHT HEMI COLECTOMY;  Surgeon: Ileana Roup, MD;  Location: [facility];  Service: General;  Laterality: N/A;   LUMBAR FUSION  1996   L4 --S1   ROTATOR CUFF REPAIR Left 2004   SHOULDER ARTHROSCOPY WITH BICEPS TENDON REPAIR Left 12/2015   SPINAL CORD STIMULATOR IMPLANT  x4  last one 2013   STONE EXTRACTION WITH BASKET Right 04/02/2016   Procedure: STONE EXTRACTION WITH BASKET;  Surgeon: Kathie Rhodes, MD;  Location: [facility];  Service: Urology;  Laterality: Right;   TONSILLECTOMY  1964  TOTAL HIP ARTHROPLASTY Right 05/18/2022   Procedure: TOTAL HIP ARTHROPLASTY ANTERIOR APPROACH;  Surgeon: Renette Butters, MD;  Location: [facility];  Service: Orthopedics;  Laterality: Right;   TRANSTHORACIC ECHOCARDIOGRAM  04/17/2014   EF 60-65%/  trivial AR, MR, and TR   UPPER GASTROINTESTINAL ENDOSCOPY     VAGINAL HYSTERECTOMY  1984   WRIST GANGLION EXCISION Right 02/05/2004   Patient Active Problem List   Diagnosis Date Noted   S/P total right hip arthroplasty 05/18/2022   S/P right hemicolectomy 06/12/2021   Aortic atherosclerosis ([place]) 03/31/2021   COPD (chronic obstructive pulmonary disease) ([place]) 11/24/2020   History of total hysterectomy 07/17/2019   Hypothyroidism 07/17/2019   Vertigo 11/06/2016   Dyspnea 11/02/2014   Upper airway cough syndrome 11/02/2014   Gout 06/28/2014   Vitamin B12 deficiency 06/28/2014    Multiple pulmonary nodules 06/28/2014   Essential hypertension 05/17/2014   Obesity (BMI 30-39.9) 01/14/2014   Lateral epicondylitis (tennis elbow) 01/02/2014   GERD (gastroesophageal reflux disease) 03/26/2013   LEG CRAMPS 05/16/2009   RESTLESS LEG SYNDROME, SEVERE 03/14/2009   Chronic interstitial cystitis 01/20/2009   Depression, major, recurrent ([place]) 03/19/2008   TUBULOVILLOUS ADENOMA, COLON 02/17/2008   Hyperlipidemia 02/17/2008   NEPHROLITHIASIS 02/17/2008   Former smoker 12/21/2007   MIGRAINE, CLASSICAL W/O INTRACTABLE MIGRAINE 06/09/2007   SYMPTOM, MEMORY LOSS 06/09/2007   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 04/21/2007   FIBROMYALGIA 04/21/2007    PCP: Garret Reddish MD  REFERRING PROVIDER: Renette Butters, MD    REFERRING DIAG: s/p rt ant THR (DOS 05/18/2022)  THERAPY DIAG:  Pain in right hip  Other symptoms and signs involving the musculoskeletal system  Muscle weakness (generalized)  Unspecified lack of coordination  Other abnormalities of gait and mobility  Rationale for Evaluation and Treatment Rehabilitation  ONSET DATE: 05/18/22  SUBJECTIVE:   SUBJECTIVE STATEMENT: Soreness right hip and knee; " I have had a rough last 2 days; I feel like I have went backwards this week"  has a history of back issues.   PERTINENT HISTORY: COPD, Fibromyalgia, Anxiety, depression, hx long covid, osteoporosis, HTN, HLD, hx lumbar fusion, spinal cord stimulator  PAIN:  Are you having pain? Yes: NPRS scale: 4/10 Pain location: R hip  Pain description: tight, sore, achey Aggravating factors: movement, walking Relieving factors: Meds  PRECAUTIONS: None  WEIGHT BEARING RESTRICTIONS No  FALLS:  Has patient fallen in last 6 months? Yes. Number of falls 4-5  LIVING ENVIRONMENT: Lives with: lives with their spouse Lives in: House/apartment Stairs: Yes: External: 2 steps; on right going up Has following equipment at home: Single point  cane, Walker - 2 wheeled, and shower chair  OCCUPATION: part time caregiver  PLOF: Independent  PATIENT GOALS Get better   OBJECTIVE: (objective measures from initial evaluation unless otherwise dated)  PATIENT SURVEYS:  FOTO 15% function  COGNITION:  Overall cognitive status: Within functional limits for tasks assessed     SENSATION: WFL   POSTURE: rounded shoulders and forward head  PALPATION: Grossly TTP R hip/quads  LOWER EXTREMITY ROM: WFL for tasks assessed  Active ROM Right eval Left eval  Hip flexion    Hip extension    Hip abduction    Hip adduction    Hip internal rotation    Hip external rotation    Knee flexion    Knee extension    Ankle dorsiflexion    Ankle plantarflexion    Ankle inversion    Ankle eversion     (  Blank rows = not tested)  LOWER EXTREMITY MMT:  MMT Right eval Left eval Right 05/31/22 Left  05/31/22  Hip flexion 3- * 4 3* 4+  Hip extension   3* 4-  Hip abduction   3+* 4+  Hip adduction      Hip internal rotation      Hip external rotation      Knee flexion 4 * 5 4+ 5  Knee extension 4 * 5 4+ 5  Ankle dorsiflexion 4 5 5 5   Ankle plantarflexion      Ankle inversion      Ankle eversion       (Blank rows = not tested) *=pain   FUNCTIONAL TESTS:  5 times sit to stand: n/t 2 minute walk test: 200 feet with RW Stairs: able to complete 4 inch stairs with alternating pattern with UE support, greatest difficulty with R hip flexion Transfers: requires UE support, labored  GAIT: Distance walked: 200 feet Assistive device utilized: Walker - 2 wheeled Level of assistance: Modified independence Comments: 2MWT, with RW, antalgic, gait improves with time; able to ambulate without AD with antalgic gait without UE support and requires increased time  Reassessment 05/31/22 5 times sit to stand: 11.4 seconds, labored without UE use.  2 minute walk test: 300 feet with RW Stairs: able to complete 4 inch stairs with alternating pattern  with UE support, greatest difficulty with R hip flexion Transfers: able to complete without UE support, labored GAIT: Distance walked: 300 feet Assistive device utilized: Walker - 2 wheeled Level of assistance: Modified independence Comments: 2MWT, with RW, antalgic, gait improves with time  TODAY'S TREATMENT: 06/25/22 Nustep Level 1, seat 4, 5 minutes Heel/toe rocks x 20 Slant board 5 x 20"   Prone lying x 1' Prone on elbows x 1' Prone press ups x 10  Hip vectors 3" x 10 each 4" step ups 2 x 10 4" lateral step ups 2 x 10  06/21/22 Alternating march 2x 20 bilateral SLS with vectors 5 x 5 second holds bilateral  Standing hip abduction with GTB at knees 2x 10 bilateral Standing lumbar extension 1x 10 - no change in symptoms Supine LTR 2x 10 bilateral  Bridge 2x 10  Prone hip extension 2x 10 bilateral  Sidelying hip abduction 1 x15 RLE Step up 6 inch 2x 10 RLE Lateral step up 6 inch 2x 10 RLE Lateral stepping 6x 15 feet Nustep Level 1, seat 4, 5 minutes   06/18/22 Nustep L1 resistance 2 x 5 min 2MWT with SPC 278f good mechanics Rockerboard lateral and DF/PF 1' each 3D hip excursin 10x  Squat 2x 10 (cueing for equal weight bearing Vector stance 3x 5" 1 HHA SLS Rt 20", Lt 33" Sidestep down blue line 1RT [employer] retro and sidestep (SBA for safety) 2PL 5RT Reciprocal pattern 7in ascend 4in descend 3RT    06/04/22 3D hip excursion (weight shift, rotate, controlled STS, extension) Walking inside // bars with LT UE  SPC 226 step through 2 point sequence Toe tapping alternating 6in step height no HHA 10 each Squat front of chair with initial cueing for mechanics 15x Tandem stance 1x 30" solid surface; 2 x30" (partial tandem on foam intermittent HHA) SLS Lt 19"Rt 18 Vector stance 2x 5" intermittent HHA Step up 6in 10x 2 sets 1HHA Step down 4in 10x  05/31/22 Reassessment/Progress note Bridge 3x 10  Sidelying hip abduction 2 x 10 RLE Hookying march 2x 10 bilateral   Standing alternating march 2x 10 bilateral  Step up 6 inch 2x 10  Lateral step up 6 inch 1x 10 STS 1x 10   PATIENT EDUCATION:  Education details:06/21/22 HEP; 05/31/22: Progress made, HEP; 05/24/22 HEP;  Eval: Patient educated on exam findings, POC, scope of PT, HEP, and frequent mobility but not excessive activity. Person educated: Patient Education method: Explanation, Demonstration, and Handouts Education comprehension: verbalized understanding, returned demonstration, verbal cues required, and tactile cues required    HOME EXERCISE PROGRAM: Access Code: [email or web address] 05/31/22 - Sidelying Hip Abduction  - 1 x daily - 7 x weekly - 2-3 sets - 10 reps - Prone Hip Extension  - 1 x daily - 7 x weekly - 2-3 sets - 10 reps  Date: 05/20/2022 - Supine Ankle Pumps  - 3 x daily - 7 x weekly - 1 sets - 20 reps - Supine Gluteal Sets  - 3 x daily - 7 x weekly - 10 reps - 5-10 second hold - Bridge  - 3 x daily - 7 x weekly - 2 sets - 10 reps - Supine Heel Slide with Strap (Mirrored)  - 3 x daily - 7 x weekly - 2 sets - 10 reps  ASSESSMENT:  CLINICAL IMPRESSION: Increased antalgic gait today on arrival. Trial of prone lying, POE and press ups reduced lower extremity symptoms.  Patient needed re instruction on correct sequencing with cane today.  Continues with limp although better after treatment.  Encouraged patient to try back extensions when leg pain flares up.  Patient will continue to benefit from physical therapy in order to improve function and reduce impairment.   OBJECTIVE IMPAIRMENTS Abnormal gait, decreased activity tolerance, decreased balance, decreased endurance, decreased mobility, difficulty walking, decreased ROM, decreased strength, increased muscle spasms, impaired flexibility, improper body mechanics, and pain.   ACTIVITY LIMITATIONS carrying, lifting, bending, standing, squatting, sleeping, stairs, transfers, dressing, locomotion level, and caring for others  PARTICIPATION  LIMITATIONS: meal prep, cleaning, laundry, shopping, community activity, and yard work  PERSONAL FACTORS Time since onset of injury/illness/exacerbation and 3+ comorbidities: COPD, Fibromyalgia, Anxiety, depression, hx long covid, osteoporosis, HTN, HLD, hx lumbar fusion, spinal cord stimulator  are also affecting patient's functional outcome.   REHAB POTENTIAL: Good  CLINICAL DECISION MAKING: Stable/uncomplicated  EVALUATION COMPLEXITY: Low   GOALS: Goals reviewed with patient? Yes  SHORT TERM GOALS: Target date: 06/10/2022  Patient will be independent with HEP in order to improve functional outcomes. Baseline:  Goal status: MET  2.  Patient will report at least 25% improvement in symptoms for improved quality of life. Baseline:  Goal status: MET   LONG TERM GOALS: Target date: 07/01/2022  Patient will report at least 75% improvement in symptoms for improved quality of life. Baseline:  Goal status: IN PROGRESS  2.  Patient will improve FOTO score by at least 37 points in order to indicate improved tolerance to activity. Baseline: 7/27 15% function Goal status: IN PROGRESS  3.  Patient will be able to navigate standard stairs with reciprocal pattern without compensation in order to demonstrate improved LE strength. Baseline: 05/20/22: 4 inch stairs with UE support, alternating, with compensations Goal status: IN PROGRESS  4.  Patient will be able to ambulate at least 400 feet in 2MWT in order to demonstrate improved tolerance to activity. Baseline: 200 with RW 05/31/22 300 feet with RW Goal status: IN PROGRESS  5.  Patient will demonstrate grade of 5/5 MMT grade in all tested musculature as evidence of improved strength to assist with stair ambulation and gait.  Baseline: see MMT Goal status: IN PROGRESS   PLAN: PT FREQUENCY: 2x/week  PT DURATION: 6 weeks  PLANNED INTERVENTIONS: Therapeutic exercises, Therapeutic activity, Neuromuscular re-education, Balance training,  Gait training, Patient/Family education, Joint manipulation, Joint mobilization, Stair training, Orthotic/Fit training, DME instructions, Aquatic Therapy, Dry Needling, Electrical stimulation, Spinal manipulation, Spinal mobilization, Cryotherapy, Moist heat, Compression bandaging, scar mobilization, Splintting, Taping, Traction, Ultrasound, Ionotophoresis 55m/ml Dexamethasone, and Manual therapy   PLAN FOR NEXT SESSION: SPC  L hip mobility and strength, progress as tolerated.  Monitor possible lumbar involvement     12:07 PM, 06/25/22 Jushua Waltman Small Hermione Havlicek MPT [facility] [place] #[phone]

## 2022-06-28 ENCOUNTER — Ambulatory Visit: Payer: Medicare Other

## 2022-06-29 ENCOUNTER — Encounter (HOSPITAL_COMMUNITY): Payer: Self-pay | Admitting: Physical Therapy

## 2022-06-29 ENCOUNTER — Ambulatory Visit (HOSPITAL_COMMUNITY): Payer: Medicare Other | Admitting: Physical Therapy

## 2022-06-29 DIAGNOSIS — M6281 Muscle weakness (generalized): Secondary | ICD-10-CM | POA: Diagnosis not present

## 2022-06-29 DIAGNOSIS — M25551 Pain in right hip: Secondary | ICD-10-CM

## 2022-06-29 DIAGNOSIS — R2689 Other abnormalities of gait and mobility: Secondary | ICD-10-CM

## 2022-06-29 DIAGNOSIS — R29898 Other symptoms and signs involving the musculoskeletal system: Secondary | ICD-10-CM | POA: Diagnosis not present

## 2022-06-29 DIAGNOSIS — R279 Unspecified lack of coordination: Secondary | ICD-10-CM | POA: Diagnosis not present

## 2022-06-29 NOTE — Therapy (Signed)
OUTPATIENT PHYSICAL THERAPY TREATMENT   Patient Name: Alison Brooks MRN: [id number] DOB:31-Jan-1956, 66 y.o., female Today's Date: 06/29/2022  Progress Note   Reporting Period 05/31/22 to 06/29/22   See note below for Objective Data and Assessment of Progress/Goals   END OF SESSION:   PT End of Session - 06/29/22 1108     Visit Number 9    Number of Visits 24    Date for PT Re-Evaluation 08/10/22    Authorization Type [employer] Medicare (no visit limit, no pre auth required)    Progress Note Due on Visit 19    PT Start Time 1110    PT Stop Time 1150    PT Time Calculation (min) 40 min    Activity Tolerance Patient tolerated treatment well    Behavior During Therapy WFL for tasks assessed/performed                   Past Medical History:  Diagnosis Date   Anxiety    Arthritis    back, neck   Chronic back pain    COPD (chronic obstructive pulmonary disease) ([place]) 2021   after Covid   Depression    Dyspnea    Since covid , NOW ON INHALERS   Fatty liver    Fibromyalgia    GERD (gastroesophageal reflux disease)    Heart murmur    Hematuria    History of adenomatous polyp of colon    History of COVID-19 10/2020   History of kidney stones    History of seizure    x1 2007 post op (per pt neurologist work-up done ?mixture of anesthesia medication and prozac that pt had taken)  and has not any issues since    History of squamous cell carcinoma excision    Hyperlipidemia    Hypertension    Hypothyroidism    IC (interstitial cystitis)    Long COVID    Migraine    Osteoporosis    Pulmonary nodules    multiple per ct -- monitored by pcp   Right ureteral stone    RLS (restless legs syndrome)    Past Surgical History:  Procedure Laterality Date   CARDIOVASCULAR STRESS TEST  09/02/2004   normal perfusion study/  normal LV function and wall motion, ef 64%   CARPAL TUNNEL RELEASE Right 1998   CERVICAL FUSION  1992   CHOLECYSTECTOMY  1995   COLONOSCOPY     CYSTO/   BOTOX INJECTION  12-27-2008  &  04-01-2006   CYSTO/  HYDRODISTENTION/  INSTILLATION THERAPY  03/04/2006   CYSTOSCOPY W/ URETERAL STENT PLACEMENT Right 04/02/2016   Procedure: CYSTOSCOPY WITH RIGHT RETROGRADE, URETEROSCOPY PYELOGRAM LASER [place];  Surgeon: Kathie Rhodes, MD;  Location: [place];  Service: Urology;  Laterality: Right;   HEMORRHOID SURGERY  01/03/2004   HOLMIUM LASER APPLICATION Right 00/93/8182   Procedure: HOLMIUM LASER APPLICATION;  Surgeon: Kathie Rhodes, MD;  Location: [facility];  Service: Urology;  Laterality: Right;   LAPAROSCOPIC RIGHT HEMI COLECTOMY N/A 06/12/2021   Procedure: LAPAROSCOPIC RIGHT HEMI COLECTOMY;  Surgeon: Ileana Roup, MD;  Location: [facility];  Service: General;  Laterality: N/A;   LUMBAR FUSION  1996   L4 --S1   ROTATOR CUFF REPAIR Left 2004   SHOULDER ARTHROSCOPY WITH BICEPS TENDON REPAIR Left 12/2015   SPINAL CORD STIMULATOR IMPLANT  x4  last one 2013   STONE EXTRACTION WITH BASKET Right 04/02/2016   Procedure: STONE EXTRACTION WITH BASKET;  Surgeon: Kathie Rhodes, MD;  Location: [facility];  Service: Urology;  Laterality: Right;   TONSILLECTOMY  1964   TOTAL HIP ARTHROPLASTY Right 05/18/2022   Procedure: TOTAL HIP ARTHROPLASTY ANTERIOR APPROACH;  Surgeon: Renette Butters, MD;  Location: [facility];  Service: Orthopedics;  Laterality: Right;   TRANSTHORACIC ECHOCARDIOGRAM  04/17/2014   EF 60-65%/  trivial AR, MR, and TR   UPPER GASTROINTESTINAL ENDOSCOPY     VAGINAL HYSTERECTOMY  1984   WRIST GANGLION EXCISION Right 02/05/2004   Patient Active Problem List   Diagnosis Date Noted   S/P total right hip arthroplasty 05/18/2022   S/P right hemicolectomy 06/12/2021   Aortic atherosclerosis ([place]) 03/31/2021   COPD (chronic obstructive pulmonary disease) ([place]) 11/24/2020   History of total hysterectomy 07/17/2019   Hypothyroidism 07/17/2019   Vertigo 11/06/2016    Dyspnea 11/02/2014   Upper airway cough syndrome 11/02/2014   Gout 06/28/2014   Vitamin B12 deficiency 06/28/2014   Multiple pulmonary nodules 06/28/2014   Essential hypertension 05/17/2014   Obesity (BMI 30-39.9) 01/14/2014   Lateral epicondylitis (tennis elbow) 01/02/2014   GERD (gastroesophageal reflux disease) 03/26/2013   LEG CRAMPS 05/16/2009   RESTLESS LEG SYNDROME, SEVERE 03/14/2009   Chronic interstitial cystitis 01/20/2009   Depression, major, recurrent ([place]) 03/19/2008   TUBULOVILLOUS ADENOMA, COLON 02/17/2008   Hyperlipidemia 02/17/2008   NEPHROLITHIASIS 02/17/2008   Former smoker 12/21/2007   MIGRAINE, CLASSICAL W/O INTRACTABLE MIGRAINE 06/09/2007   SYMPTOM, MEMORY LOSS 06/09/2007   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 Some pulling in front of hip. R knee has been painful. She doesn't feel back to par yet. Continues to have to use RW sometimes. Uses cane for short distance. States close to 80% improvement with PT intervention. Still cant put shoe on, bend down.   PERTINENT HISTORY: COPD, Fibromyalgia, Anxiety, depression, hx long covid, osteoporosis, HTN, HLD, hx lumbar fusion, spinal cord stimulator  PAIN:  Are you having pain? Yes: NPRS scale: 2/10 Pain location: R hip  Pain description: tight, sore, achey Aggravating factors: movement, walking Relieving factors: Meds  PRECAUTIONS: None  WEIGHT BEARING RESTRICTIONS No  FALLS:  Has patient fallen  in last 6 months? Yes. Number of falls 4-5  LIVING ENVIRONMENT: Lives with: lives with their spouse Lives in: House/apartment Stairs: Yes: External: 2 steps; on right going up Has following equipment at home: Single point cane, Walker - 2 wheeled, and shower chair  OCCUPATION: part time caregiver  PLOF: Independent  PATIENT GOALS Get better   OBJECTIVE: (objective measures from initial evaluation unless otherwise dated)  PATIENT SURVEYS:  FOTO 15% function  06/29/22: 29% function  COGNITION:  Overall cognitive status: Within functional limits for tasks assessed     SENSATION: WFL   POSTURE: rounded shoulders and forward head  PALPATION: Grossly TTP R hip/quads  LOWER EXTREMITY ROM: WFL for tasks assessed  Active ROM Right eval Left eval  Hip flexion    Hip extension    Hip abduction    Hip  adduction    Hip internal rotation    Hip external rotation    Knee flexion    Knee extension    Ankle dorsiflexion    Ankle plantarflexion    Ankle inversion    Ankle eversion     (Blank rows = not tested)  LOWER EXTREMITY MMT:  MMT Right eval Left eval Right 05/31/22 Left  05/31/22 Right 06/29/22 Left 06/29/22  Hip flexion 3- * 4 3* 4+ 4- 5  Hip extension   3* 4- 4-* 4+  Hip abduction   3+* 4+ 4-* 4+  Hip adduction        Hip internal rotation        Hip external rotation        Knee flexion 4 * 5 4+ 5 5 5   Knee extension 4 * 5 4+ 5 5 5   Ankle dorsiflexion 4 5 5 5     Ankle plantarflexion        Ankle inversion        Ankle eversion         (Blank rows = not tested) *=pain   FUNCTIONAL TESTS:  5 times sit to stand: n/t 2 minute walk test: 200 feet with RW Stairs: able to complete 4 inch stairs with alternating pattern with UE support, greatest difficulty with R hip flexion Transfers: requires UE support, labored  GAIT: Distance walked: 200 feet Assistive device utilized: Walker - 2 wheeled Level of assistance: Modified independence Comments: 2MWT, with RW,  antalgic, gait improves with time; able to ambulate without AD with antalgic gait without UE support and requires increased time  Reassessment 05/31/22 5 times sit to stand: 11.4 seconds, labored without UE use.  2 minute walk test: 300 feet with RW Stairs: able to complete 4 inch stairs with alternating pattern with UE support, greatest difficulty with R hip flexion Transfers: able to complete without UE support, labored GAIT: Distance walked: 300 feet Assistive device utilized: Environmental consultant - 2 wheeled Level of assistance: Modified independence Comments: 2MWT, with RW, antalgic, gait improves with time  Reassessment 06/29/22 5 times sit to stand: 11.3 seconds, labored without UE use, uses LLE>RLE 2 minute walk test: 235 feet with RW Stairs: able to complete 7 inch stairs with alternating pattern with UE support, decreased RLE strength and motor control Transfers: able to complete without UE support, labored GAIT: Distance walked: 235 feet Assistive device utilized: SPC Level of assistance: Modified independence Comments: 2MWT, with SPC, antalgic, decreased R stance time  TODAY'S TREATMENT: 06/29/22 Nustep Level 1, seat 4, 5 minutes Reassessment  Prone quad stretch 5 x 20 second holds RLE Prone hip extension 2x 10  Step up 6 inch 2x 10 RLE  06/25/22 Nustep Level 1, seat 4, 5 minutes Heel/toe rocks x 20 Slant board 5 x 20"   Prone lying x 1' Prone on elbows x 1' Prone press ups x 10  Hip vectors 3" x 10 each 4" step ups 2 x 10 4" lateral step ups 2 x 10  06/21/22 Alternating march 2x 20 bilateral SLS with vectors 5 x 5 second holds bilateral  Standing hip abduction with GTB at knees 2x 10 bilateral Standing lumbar extension 1x 10 - no change in symptoms Supine LTR 2x 10 bilateral  Bridge 2x 10  Prone hip extension 2x 10 bilateral  Sidelying hip abduction 1 x15 RLE Step up 6 inch 2x 10 RLE Lateral step up 6 inch 2x 10 RLE Lateral stepping 6x 15 feet Nustep Level 1,  seat 4, 5  minutes   06/18/22 Nustep L1 resistance 2 x 5 min 2MWT with SPC 252f good mechanics Rockerboard lateral and DF/PF 1' each 3D hip excursin 10x  Squat 2x 10 (cueing for equal weight bearing Vector stance 3x 5" 1 HHA SLS Rt 20", Lt 33" Sidestep down blue line 1RT [employer] retro and sidestep (SBA for safety) 2PL 5RT Reciprocal pattern 7in ascend 4in descend 3RT  PATIENT EDUCATION:  Education details:06/21/22 HEP; 05/31/22: Progress made, HEP; 05/24/22 HEP;  Eval: Patient educated on exam findings, POC, scope of PT, HEP, and frequent mobility but not excessive activity. Person educated: Patient Education method: Explanation, Demonstration, and Handouts Education comprehension: verbalized understanding, returned demonstration, verbal cues required, and tactile cues required    HOME EXERCISE PROGRAM: Access Code: [email or web address] 05/31/22 - Sidelying Hip Abduction  - 1 x daily - 7 x weekly - 2-3 sets - 10 reps - Prone Hip Extension  - 1 x daily - 7 x weekly - 2-3 sets - 10 reps  Date: 05/20/2022 - Supine Ankle Pumps  - 3 x daily - 7 x weekly - 1 sets - 20 reps - Supine Gluteal Sets  - 3 x daily - 7 x weekly - 10 reps - 5-10 second hold - Bridge  - 3 x daily - 7 x weekly - 2 sets - 10 reps - Supine Heel Slide with Strap (Mirrored)  - 3 x daily - 7 x weekly - 2 sets - 10 reps  ASSESSMENT:  CLINICAL IMPRESSION: Patient has met 2/2 short term goals and 1/5 long term goals with ability to complete HEP and improvement in symptoms. Patient continues to have deficits in strength, gait, balance, activity tolerance and functional mobility but is making good progress towards goals. Extending POC 2x/week for additional 6 weeks to improve strength and mobility. Continued with mobility and glute strengthening today which is tolerated well. Patient will continue to benefit from physical therapy in order to improve function and reduce impairment.    OBJECTIVE IMPAIRMENTS Abnormal gait, decreased activity  tolerance, decreased balance, decreased endurance, decreased mobility, difficulty walking, decreased ROM, decreased strength, increased muscle spasms, impaired flexibility, improper body mechanics, and pain.   ACTIVITY LIMITATIONS carrying, lifting, bending, standing, squatting, sleeping, stairs, transfers, dressing, locomotion level, and caring for others  PARTICIPATION LIMITATIONS: meal prep, cleaning, laundry, shopping, community activity, and yard work  PERSONAL FACTORS Time since onset of injury/illness/exacerbation and 3+ comorbidities: COPD, Fibromyalgia, Anxiety, depression, hx long covid, osteoporosis, HTN, HLD, hx lumbar fusion, spinal cord stimulator  are also affecting patient's functional outcome.   REHAB POTENTIAL: Good  CLINICAL DECISION MAKING: Stable/uncomplicated  EVALUATION COMPLEXITY: Low   GOALS: Goals reviewed with patient? Yes  SHORT TERM GOALS: Target date: 06/10/2022  Patient will be independent with HEP in order to improve functional outcomes. Baseline:  Goal status: MET  2.  Patient will report at least 25% improvement in symptoms for improved quality of life. Baseline:  Goal status: MET   LONG TERM GOALS: Target date: 07/01/2022  Patient will report at least 75% improvement in symptoms for improved quality of life. Baseline:  Goal status: MET  2.  Patient will improve FOTO score by at least 37 points in order to indicate improved tolerance to activity. Baseline: 7/27 15% function 06/29/22: 29% function Goal status: IN PROGRESS  3.  Patient will be able to navigate standard stairs with reciprocal pattern without compensation in order to demonstrate improved LE strength. Baseline: 05/20/22: 4 inch stairs with UE support,  alternating, with compensations 06/29/22 able on 7 inch with compensations Goal status: IN PROGRESS  4.  Patient will be able to ambulate at least 400 feet in 2MWT in order to demonstrate improved tolerance to activity. Baseline: 200 with  RW 05/31/22 300 feet with RW 06/29/22: 235 feet with SPC Goal status: IN PROGRESS  5.  Patient will demonstrate grade of 5/5 MMT grade in all tested musculature as evidence of improved strength to assist with stair ambulation and gait.   Baseline: see MMT Goal status: IN PROGRESS   PLAN: PT FREQUENCY: 2x/week  PT DURATION: 6 weeks  PLANNED INTERVENTIONS: Therapeutic exercises, Therapeutic activity, Neuromuscular re-education, Balance training, Gait training, Patient/Family education, Joint manipulation, Joint mobilization, Stair training, Orthotic/Fit training, DME instructions, Aquatic Therapy, Dry Needling, Electrical stimulation, Spinal manipulation, Spinal mobilization, Cryotherapy, Moist heat, Compression bandaging, scar mobilization, Splintting, Taping, Traction, Ultrasound, Ionotophoresis 22m/ml Dexamethasone, and Manual therapy   PLAN FOR NEXT SESSION: SPC  L hip mobility and strength, progress as tolerated.  Monitor possible lumbar involvement    11:51 AM, 06/29/22 AMearl LatinPT, DPT Physical Therap[facility]

## 2022-06-30 DIAGNOSIS — M1611 Unilateral primary osteoarthritis, right hip: Secondary | ICD-10-CM | POA: Diagnosis not present

## 2022-07-01 ENCOUNTER — Ambulatory Visit (HOSPITAL_COMMUNITY): Payer: Medicare Other | Admitting: Physical Therapy

## 2022-07-01 ENCOUNTER — Encounter (HOSPITAL_COMMUNITY): Payer: Self-pay | Admitting: Physical Therapy

## 2022-07-01 DIAGNOSIS — R2689 Other abnormalities of gait and mobility: Secondary | ICD-10-CM | POA: Diagnosis not present

## 2022-07-01 DIAGNOSIS — M6281 Muscle weakness (generalized): Secondary | ICD-10-CM

## 2022-07-01 DIAGNOSIS — R29898 Other symptoms and signs involving the musculoskeletal system: Secondary | ICD-10-CM | POA: Diagnosis not present

## 2022-07-01 DIAGNOSIS — R279 Unspecified lack of coordination: Secondary | ICD-10-CM | POA: Diagnosis not present

## 2022-07-01 DIAGNOSIS — M25551 Pain in right hip: Secondary | ICD-10-CM

## 2022-07-01 NOTE — Therapy (Signed)
OUTPATIENT PHYSICAL THERAPY TREATMENT   Patient Name: Alison Brooks MRN: [id number] DOB:26-Jun-1956, 66 y.o., female Today's Date: 07/01/2022   END OF SESSION:   PT End of Session - 07/01/22 1114     Visit Number 10    Number of Visits 24    Date for PT Re-Evaluation 08/10/22    Authorization Type [employer] Medicare (no visit limit, no pre auth required)    Progress Note Due on Visit 21    PT Start Time 1114    PT Stop Time 1152    PT Time Calculation (min) 38 min    Activity Tolerance Patient tolerated treatment well    Behavior During Therapy WFL for tasks assessed/performed                   Past Medical History:  Diagnosis Date   Anxiety    Arthritis    back, neck   Chronic back pain    COPD (chronic obstructive pulmonary disease) ([place]) 2021   after Covid   Depression    Dyspnea    Since covid , NOW ON INHALERS   Fatty liver    Fibromyalgia    GERD (gastroesophageal reflux disease)    Heart murmur    Hematuria    History of adenomatous polyp of colon    History of COVID-19 10/2020   History of kidney stones    History of seizure    x1 2007 post op (per pt neurologist work-up done ?mixture of anesthesia medication and prozac that pt had taken)  and has not any issues since    History of squamous cell carcinoma excision    Hyperlipidemia    Hypertension    Hypothyroidism    IC (interstitial cystitis)    Long COVID    Migraine    Osteoporosis    Pulmonary nodules    multiple per ct -- monitored by pcp   Right ureteral stone    RLS (restless legs syndrome)    Past Surgical History:  Procedure Laterality Date   CARDIOVASCULAR STRESS TEST  09/02/2004   normal perfusion study/  normal LV function and wall motion, ef 64%   CARPAL TUNNEL RELEASE Right 1998   CERVICAL FUSION  1992   CHOLECYSTECTOMY  1995   COLONOSCOPY     CYSTO/  BOTOX INJECTION  12-27-2008  &  04-01-2006   CYSTO/  HYDRODISTENTION/  INSTILLATION THERAPY  03/04/2006   CYSTOSCOPY W/  URETERAL STENT PLACEMENT Right 04/02/2016   Procedure: CYSTOSCOPY WITH RIGHT RETROGRADE, URETEROSCOPY PYELOGRAM LASER Bon[place];  Surgeon: Kathie Rhodes, MD;  Location: [facility];  Service: Urology;  Laterality: Right;   HEMORRHOID SURGERY  01/03/2004   HOLMIUM LASER APPLICATION Right 50/35/4656   Procedure: HOLMIUM LASER APPLICATION;  Surgeon: Kathie Rhodes, MD;  Location: [facility];  Service: Urology;  Laterality: Right;   LAPAROSCOPIC RIGHT HEMI COLECTOMY N/A 06/12/2021   Procedure: LAPAROSCOPIC RIGHT HEMI COLECTOMY;  Surgeon: Ileana Roup, MD;  Location: [facility];  Service: General;  Laterality: N/A;   LUMBAR FUSION  1996   L4 --S1   ROTATOR CUFF REPAIR Left 2004   SHOULDER ARTHROSCOPY WITH BICEPS TENDON REPAIR Left 12/2015   SPINAL CORD STIMULATOR IMPLANT  x4  last one 2013   STONE EXTRACTION WITH BASKET Right 04/02/2016   Procedure: STONE EXTRACTION WITH BASKET;  Surgeon: Kathie Rhodes, MD;  Location: [facility];  Service: Urology;  Laterality: Right;   TONSILLECTOMY  1964  TOTAL HIP ARTHROPLASTY Right 05/18/2022   Procedure: TOTAL HIP ARTHROPLASTY ANTERIOR APPROACH;  Surgeon: Renette Butters, MD;  Location: [facility];  Service: Orthopedics;  Laterality: Right;   TRANSTHORACIC ECHOCARDIOGRAM  04/17/2014   EF 60-65%/  trivial AR, MR, and TR   UPPER GASTROINTESTINAL ENDOSCOPY     VAGINAL HYSTERECTOMY  1984   WRIST GANGLION EXCISION Right 02/05/2004   Patient Active Problem List   Diagnosis Date Noted   S/P total right hip arthroplasty 05/18/2022   S/P right hemicolectomy 06/12/2021   Aortic atherosclerosis ([place]) 03/31/2021   COPD (chronic obstructive pulmonary disease) ([place]) 11/24/2020   History of total hysterectomy 07/17/2019   Hypothyroidism 07/17/2019   Vertigo 11/06/2016   Dyspnea 11/02/2014   Upper airway cough syndrome 11/02/2014   Gout 06/28/2014   Vitamin B12 deficiency 06/28/2014    Multiple pulmonary nodules 06/28/2014   Essential hypertension 05/17/2014   Obesity (BMI 30-39.9) 01/14/2014   Lateral epicondylitis (tennis elbow) 01/02/2014   GERD (gastroesophageal reflux disease) 03/26/2013   LEG CRAMPS 05/16/2009   RESTLESS LEG SYNDROME, SEVERE 03/14/2009   Chronic interstitial cystitis 01/20/2009   Depression, major, recurrent ([place]) 03/19/2008   TUBULOVILLOUS ADENOMA, COLON 02/17/2008   Hyperlipidemia 02/17/2008   NEPHROLITHIASIS 02/17/2008   Former smoker 12/21/2007   MIGRAINE, CLASSICAL W/O INTRACTABLE MIGRAINE 06/09/2007   SYMPTOM, MEMORY LOSS 06/09/2007   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 She will f/u with him in 6 weeks. Exercises have been alright at home.   PERTINENT HISTORY: COPD, Fibromyalgia, Anxiety, depression, hx long covid, osteoporosis, HTN, HLD, hx lumbar fusion, spinal cord stimulator  PAIN:  Are you having pain? Yes: NPRS scale: 2/10 Pain location: R hip  Pain description: tight, sore, achey Aggravating factors: movement, walking Relieving factors: Meds  PRECAUTIONS: None  WEIGHT BEARING RESTRICTIONS No  FALLS:  Has patient fallen in last 6 months? Yes. Number of falls 4-5  LIVING ENVIRONMENT: Lives with: lives with their spouse Lives in: House/apartment Stairs: Yes: External: 2 steps; on right going up Has following equipment at home: Single point cane, Walker - 2 wheeled, and shower  chair  OCCUPATION: part time caregiver  PLOF: Independent  PATIENT GOALS Get better   OBJECTIVE: (objective measures from initial evaluation unless otherwise dated)  PATIENT SURVEYS:  FOTO 15% function  06/29/22: 29% function  COGNITION:  Overall cognitive status: Within functional limits for tasks assessed     SENSATION: WFL   POSTURE: rounded shoulders and forward head  PALPATION: Grossly TTP R hip/quads  LOWER EXTREMITY ROM: WFL for tasks assessed  Active ROM Right eval Left eval  Hip flexion    Hip extension    Hip abduction    Hip adduction    Hip internal rotation    Hip external rotation    Knee flexion    Knee extension    Ankle dorsiflexion    Ankle plantarflexion    Ankle inversion    Ankle eversion     (Blank rows =  not tested)  LOWER EXTREMITY MMT:  MMT Right eval Left eval Right 05/31/22 Left  05/31/22 Right 06/29/22 Left 06/29/22  Hip flexion 3- * 4 3* 4+ 4- 5  Hip extension   3* 4- 4-* 4+  Hip abduction   3+* 4+ 4-* 4+  Hip adduction        Hip internal rotation        Hip external rotation        Knee flexion 4 * 5 4+ 5 5 5   Knee extension 4 * 5 4+ 5 5 5   Ankle dorsiflexion 4 5 5 5     Ankle plantarflexion        Ankle inversion        Ankle eversion         (Blank rows = not tested) *=pain   FUNCTIONAL TESTS:  5 times sit to stand: n/t 2 minute walk test: 200 feet with RW Stairs: able to complete 4 inch stairs with alternating pattern with UE support, greatest difficulty with R hip flexion Transfers: requires UE support, labored  GAIT: Distance walked: 200 feet Assistive device utilized: Walker - 2 wheeled Level of assistance: Modified independence Comments: 2MWT, with RW, antalgic, gait improves with time; able to ambulate without AD with antalgic gait without UE support and requires increased time  Reassessment 05/31/22 5 times sit to stand: 11.4 seconds, labored without UE use.  2 minute walk test: 300 feet with RW Stairs:  able to complete 4 inch stairs with alternating pattern with UE support, greatest difficulty with R hip flexion Transfers: able to complete without UE support, labored GAIT: Distance walked: 300 feet Assistive device utilized: Environmental consultant - 2 wheeled Level of assistance: Modified independence Comments: 2MWT, with RW, antalgic, gait improves with time  Reassessment 06/29/22 5 times sit to stand: 11.3 seconds, labored without UE use, uses LLE>RLE 2 minute walk test: 235 feet with RW Stairs: able to complete 7 inch stairs with alternating pattern with UE support, decreased RLE strength and motor control Transfers: able to complete without UE support, labored GAIT: Distance walked: 235 feet Assistive device utilized: SPC Level of assistance: Modified independence Comments: 2MWT, with SPC, antalgic, decreased R stance time  TODAY'S TREATMENT: 07/01/22 Nustep Level 3, seat 4, 5 minutes Alternating march 2x 20 bilateral  Standing hip abduction with GTB at knees 2x 10  Standing hip abduction with GTB at ankles 2x 10  Step up 6 inch 2x 10 RLE Lateral step up 6 inch 2x10 RLE Mini squat with UE support 2x 10  TKE 2x 10 5 second holds 3 plates  06/29/17 Nustep Level 1, seat 4, 5 minutes Reassessment  Prone quad stretch 5 x 20 second holds RLE Prone hip extension 2x 10  Step up 6 inch 2x 10 RLE  06/25/22 Nustep Level 1, seat 4, 5 minutes Heel/toe rocks x 20 Slant board 5 x 20"   Prone lying x 1' Prone on elbows x 1' Prone press ups x 10  Hip vectors 3" x 10 each 4" step ups 2 x 10 4" lateral step ups 2 x 10  06/21/22 Alternating march 2x 20 bilateral SLS with vectors 5 x 5 second holds bilateral  Standing hip abduction with GTB at knees 2x 10 bilateral Standing lumbar extension 1x 10 - no change in symptoms Supine LTR 2x 10 bilateral  Bridge 2x 10  Prone hip extension 2x 10 bilateral  Sidelying hip abduction 1 x15 RLE Step up 6 inch 2x 10 RLE Lateral  step up 6 inch 2x 10  RLE Lateral stepping 6x 15 feet Nustep Level 1, seat 4, 5 minutes   PATIENT EDUCATION:  Education details:06/21/22 HEP; 05/31/22: Progress made, HEP; 05/24/22 HEP;  Eval: Patient educated on exam findings, POC, scope of PT, HEP, and frequent mobility but not excessive activity. Person educated: Patient Education method: Explanation, Demonstration, and Handouts Education comprehension: verbalized understanding, returned demonstration, verbal cues required, and tactile cues required    HOME EXERCISE PROGRAM: Access Code: [email or web address] 05/31/22 - Sidelying Hip Abduction  - 1 x daily - 7 x weekly - 2-3 sets - 10 reps - Prone Hip Extension  - 1 x daily - 7 x weekly - 2-3 sets - 10 reps  Date: 05/20/2022 - Supine Ankle Pumps  - 3 x daily - 7 x weekly - 1 sets - 20 reps - Supine Gluteal Sets  - 3 x daily - 7 x weekly - 10 reps - 5-10 second hold - Bridge  - 3 x daily - 7 x weekly - 2 sets - 10 reps - Supine Heel Slide with Strap (Mirrored)  - 3 x daily - 7 x weekly - 2 sets - 10 reps  ASSESSMENT:  CLINICAL IMPRESSION: Began session on nu step for dynamic warm up and conditioning and she tolerates increased resistance.  Continued with strengthening which are tolerated well. She demonstrates improving functional strength. Patient will continue to benefit from physical therapy in order to improve function and reduce impairment.    OBJECTIVE IMPAIRMENTS Abnormal gait, decreased activity tolerance, decreased balance, decreased endurance, decreased mobility, difficulty walking, decreased ROM, decreased strength, increased muscle spasms, impaired flexibility, improper body mechanics, and pain.   ACTIVITY LIMITATIONS carrying, lifting, bending, standing, squatting, sleeping, stairs, transfers, dressing, locomotion level, and caring for others  PARTICIPATION LIMITATIONS: meal prep, cleaning, laundry, shopping, community activity, and yard work  PERSONAL FACTORS Time since onset of injury/illness/exacerbation  and 3+ comorbidities: COPD, Fibromyalgia, Anxiety, depression, hx long covid, osteoporosis, HTN, HLD, hx lumbar fusion, spinal cord stimulator  are also affecting patient's functional outcome.   REHAB POTENTIAL: Good  CLINICAL DECISION MAKING: Stable/uncomplicated  EVALUATION COMPLEXITY: Low   GOALS: Goals reviewed with patient? Yes  SHORT TERM GOALS: Target date: 06/10/2022  Patient will be independent with HEP in order to improve functional outcomes. Baseline:  Goal status: MET  2.  Patient will report at least 25% improvement in symptoms for improved quality of life. Baseline:  Goal status: MET   LONG TERM GOALS: Target date: 07/01/2022  Patient will report at least 75% improvement in symptoms for improved quality of life. Baseline:  Goal status: MET  2.  Patient will improve FOTO score by at least 37 points in order to indicate improved tolerance to activity. Baseline: 7/27 15% function 06/29/22: 29% function Goal status: IN PROGRESS  3.  Patient will be able to navigate standard stairs with reciprocal pattern without compensation in order to demonstrate improved LE strength. Baseline: 05/20/22: 4 inch stairs with UE support, alternating, with compensations 06/29/22 able on 7 inch with compensations Goal status: IN PROGRESS  4.  Patient will be able to ambulate at least 400 feet in 2MWT in order to demonstrate improved tolerance to activity. Baseline: 200 with RW 05/31/22 300 feet with RW 06/29/22: 235 feet with SPC Goal status: IN PROGRESS  5.  Patient will demonstrate grade of 5/5 MMT grade in all tested musculature as evidence of improved strength to assist with stair ambulation and gait.   Baseline: see MMT  Goal status: IN PROGRESS   PLAN: PT FREQUENCY: 2x/week  PT DURATION: 6 weeks  PLANNED INTERVENTIONS: Therapeutic exercises, Therapeutic activity, Neuromuscular re-education, Balance training, Gait training, Patient/Family education, Joint manipulation, Joint  mobilization, Stair training, Orthotic/Fit training, DME instructions, Aquatic Therapy, Dry Needling, Electrical stimulation, Spinal manipulation, Spinal mobilization, Cryotherapy, Moist heat, Compression bandaging, scar mobilization, Splintting, Taping, Traction, Ultrasound, Ionotophoresis 63m/ml Dexamethasone, and Manual therapy   PLAN FOR NEXT SESSION: SPC  L hip mobility and strength, progress as tolerated.  Monitor possible lumbar involvement    11:15 AM, 07/01/22 AMearl LatinPT, DPT [facility]

## 2022-07-02 ENCOUNTER — Ambulatory Visit (INDEPENDENT_AMBULATORY_CARE_PROVIDER_SITE_OTHER): Payer: Medicare Other

## 2022-07-02 DIAGNOSIS — Z0000 Encounter for general adult medical examination without abnormal findings: Secondary | ICD-10-CM | POA: Diagnosis not present

## 2022-07-02 NOTE — Progress Notes (Signed)
Virtual Visit via Telephone Note  I connected with  Alison Brooks on 07/02/22 at 10:30 AM EDT by telephone and verified that I am speaking with the correct person using two identifiers.  Medicare Annual Wellness visit completed telephonically due to Covid-19 pandemic.   Persons participating in this call: This Health Coach and this patient.   Location: Patient: home Provider: office    I discussed the limitations, risks, security and privacy concerns of performing an evaluation and management service by telephone and the availability of in person appointments. The patient expressed understanding and agreed to proceed.  Unable to perform video visit due to video visit attempted and failed and/or patient does not have video capability.   Some vital signs may be absent or patient reported.   Willette Brace, LPN   Subjective:   Alison Brooks is a 66 y.o. female who presents for Medicare Annual (Subsequent) preventive examination.  Review of Systems     Cardiac Risk Factors include: advanced age (>63mn, >>43women);dyslipidemia;hypertension     Objective:    There were no vitals filed for this visit. There is no height or weight on file to calculate BMI.     07/02/2022   10:40 AM 05/20/2022   11:27 AM 05/18/2022    2:00 PM 05/06/2022    9:23 AM 09/24/2021    2:14 PM 06/12/2021    5:14 PM 06/11/2021   10:20 AM  Advanced Directives  Does Patient Have a Medical Advance Directive? No No No No Yes No No  Type of Advance Directive     Living will    Would patient like information on creating a medical advance directive? No - Patient declined No - Patient declined No - Patient declined No - Patient declined  No - Patient declined No - Patient declined    Current Medications (verified) Outpatient Encounter Medications as of 07/02/2022  Medication Sig   acetaminophen (TYLENOL) 500 MG tablet Take 2 tablets (1,000 mg total) by mouth every 6 (six) hours as needed for moderate pain or  mild pain.   albuterol (VENTOLIN HFA) 108 (90 Base) MCG/ACT inhaler INHALE 2 PUFFS EVERY 6 HOURS AS NEEDED.   allopurinol (ZYLOPRIM) 100 MG tablet TAKE 1 TABLET BY MOUTH  DAILY (Patient taking differently: Take 100 mg by mouth daily as needed (gout pain).)   amLODipine (NORVASC) 2.5 MG tablet TAKE 1 TABLET BY MOUTH  DAILY   aspirin EC 81 MG tablet Take 1 tablet (81 mg total) by mouth 2 (two) times daily. To prevent blood clots for 30 days after surgery.   diclofenac sodium (VOLTAREN) 1 % GEL Apply 4 g topically 4 (four) times daily. (Patient taking differently: Apply 4 g topically 4 (four) times daily as needed (pain).)   escitalopram (LEXAPRO) 20 MG tablet TAKE 1 TABLET BY MOUTH  DAILY   ezetimibe (ZETIA) 10 MG tablet TAKE 1 TABLET BY MOUTH  DAILY   famotidine (PEPCID) 20 MG tablet TAKE 1 TABLET BY MOUTH AT  BEDTIME   levothyroxine (SYNTHROID, LEVOTHROID) 50 MCG tablet Take 50 mcg by mouth daily before breakfast.   meloxicam (MOBIC) 15 MG tablet Take 1 tablet (15 mg total) by mouth daily as needed for pain (and inflammation).   methocarbamol (ROBAXIN-750) 750 MG tablet Take 1 tablet (750 mg total) by mouth every 8 (eight) hours as needed for muscle spasms.   oxybutynin (DITROPAN) 5 MG tablet TAKE 1 TABLET BY MOUTH 2  TIMES DAILY AS NEEDED FOR  BLADDER SPASMS. (  Patient taking differently: TAKE 1 TABLET BY MOUTH 2  TIMES DAILY AS NEEDED FOR  BLADDER SPASMS.)   pantoprazole (PROTONIX) 40 MG tablet TAKE 1 TABLET BY MOUTH  TWICE DAILY 30 TO 60  MINUTES BEFORE MEALS   promethazine (PHENERGAN) 12.5 MG tablet Take 1 tablet (12.5 mg total) by mouth every 6 (six) hours as needed for nausea or vomiting.   rosuvastatin (CRESTOR) 40 MG tablet TAKE 1 TABLET BY MOUTH  DAILY   SPIRIVA RESPIMAT 2.5 MCG/ACT AERS INHALE 2 PUFFS INTO THE LUNGS ONCE DAILY.   topiramate (TOPAMAX) 200 MG tablet TAKE 1 TABLET BY MOUTH  DAILY   cyanocobalamin (,VITAMIN B-12,) 1000 MCG/ML injection 1000 mcg (1 mg) injection once per per  month or as directed   [DISCONTINUED] orlistat (XENICAL) 120 MG capsule Take 1 capsule (120 mg total) by mouth 3 (three) times daily with meals.   [DISCONTINUED] Oxycodone HCl 10 MG TABS Take 0.5-1 tablets (5-10 mg total) by mouth every 8 (eight) hours as needed (for severe pain after surgery).   No facility-administered encounter medications on file as of 07/02/2022.    Allergies (verified) Latex and Zofran [ondansetron hcl]   History: Past Medical History:  Diagnosis Date   Anxiety    Arthritis    back, neck   Chronic back pain    COPD (chronic obstructive pulmonary disease) ([place]) 2021   after Covid   Depression    Dyspnea    Since covid , NOW ON INHALERS   Fatty liver    Fibromyalgia    GERD (gastroesophageal reflux disease)    Heart murmur    Hematuria    History of adenomatous polyp of colon    History of COVID-19 10/2020   History of kidney stones    History of seizure    x1 2007 post op (per pt neurologist work-up done ?mixture of anesthesia medication and prozac that pt had taken)  and has not any issues since    History of squamous cell carcinoma excision    Hyperlipidemia    Hypertension    Hypothyroidism    IC (interstitial cystitis)    Long COVID    Migraine    Osteoporosis    Pulmonary nodules    multiple per ct -- monitored by pcp   Right ureteral stone    RLS (restless legs syndrome)    Past Surgical History:  Procedure Laterality Date   CARDIOVASCULAR STRESS TEST  09/02/2004   normal perfusion study/  normal LV function and wall motion, ef 64%   CARPAL TUNNEL RELEASE Right 1998   CERVICAL FUSION  1992   CHOLECYSTECTOMY  1995   COLONOSCOPY     CYSTO/  BOTOX INJECTION  12-27-2008  &  04-01-2006   CYSTO/  HYDRODISTENTION/  INSTILLATION THERAPY  03/04/2006   CYSTOSCOPY W/ URETERAL STENT PLACEMENT Right 04/02/2016   Procedure: CYSTOSCOPY WITH RIGHT RETROGRADE, URETEROSCOPY PYELOGRAM LASER [place];  Surgeon: Kathie Rhodes,  MD;  Location: [facility];  Service: Urology;  Laterality: Right;   HEMORRHOID SURGERY  01/03/2004   HOLMIUM LASER APPLICATION Right 11/91/4782   Procedure: HOLMIUM LASER APPLICATION;  Surgeon: Kathie Rhodes, MD;  Location[facility];  Service: Urology;  Laterality: Right;   LAPAROSCOPIC RIGHT HEMI COLECTOMY N/A 06/12/2021   Procedure: LAPAROSCOPIC RIGHT HEMI COLECTOMY;  Surgeon: Ileana Roup, MD;  Location: [facility];  Service: General;  Laterality: N/A;   LUMBAR FUSION  1996   L4 --S1   ROTATOR CUFF  REPAIR Left 2004   SHOULDER ARTHROSCOPY WITH BICEPS TENDON REPAIR Left 12/2015   SPINAL CORD STIMULATOR IMPLANT  x4  last one 2013   STONE EXTRACTION WITH BASKET Right 04/02/2016   Procedure: STONE EXTRACTION WITH BASKET;  Surgeon: Kathie Rhodes, MD;  Location: [facility];  Service: Urology;  Laterality: Right;   TONSILLECTOMY  1964   TOTAL HIP ARTHROPLASTY Right 05/18/2022   Procedure: TOTAL HIP ARTHROPLASTY ANTERIOR APPROACH;  Surgeon: Renette Butters, MD;  Location: [facility];  Service: Orthopedics;  Laterality: Right;   TRANSTHORACIC ECHOCARDIOGRAM  04/17/2014   EF 60-65%/  trivial AR, MR, and TR   UPPER GASTROINTESTINAL ENDOSCOPY     VAGINAL HYSTERECTOMY  1984   WRIST GANGLION EXCISION Right 02/05/2004   Family History  Problem Relation Age of Onset   Heart attack Mother        Mom 39, Dad 50   Hypertension Mother    Heart disease Mother    Heart disease Father    Diabetes Father    Colon cancer Maternal Grandmother        not sure age of onset   Esophageal cancer Neg Hx    Rectal cancer Neg Hx    Stomach cancer Neg Hx    Social History   Socioeconomic History   Marital status: Married    Spouse name: Antony Haste   Number of children: 1   Years of education: College   Highest education level: Not on file  Occupational History   Occupation: Retired Marine scientist  Tobacco Use   Smoking status: Former    Packs/day: 1.25    Years:  40.00    Total pack years: 50.00    Types: Cigarettes    Quit date: 06/07/2008    Years since quitting: 14.0   Smokeless tobacco: Never  Vaping Use   Vaping Use: Never used  Substance and Sexual Activity   Alcohol use: Yes    Alcohol/week: 0.0 standard drinks of alcohol    Comment: VERY RARE   Drug use: No   Sexual activity: Not on file  Other Topics Concern   Not on file  Social History Narrative   Lives with spouse, Antony Haste.    Caffeine use: 1-2 cups coffee per day   Married 47 years in December 2020. 1 daughter. 2 grandkids (boy and girl). Lives 10 miles away.       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       Hobbies: previously enjoyed dancing, old car shoes (27 chevy), reading   Social Determinants of Health   Financial Resource Strain: Low Risk  (07/02/2022)   Overall Financial Resource Strain (CARDIA)    Difficulty of Paying Living Expenses: Not hard at all  Food Insecurity: No Food Insecurity (07/02/2022)   Hunger Vital Sign    Worried About Running Out of Food in the Last Year: Never true    Ran Out of Food in the Last Year: Never true  Transportation Needs: No Transportation Needs (07/02/2022)   PRAPARE - Hydrologist (Medical): No    Lack of Transportation (Non-Medical): No  Physical Activity: Sufficiently Active (07/02/2022)   Exercise Vital Sign    Days of Exercise per Week: 3 days    Minutes of Exercise per Session: 50 min  Stress: No Stress Concern Present (07/02/2022)   [place]    Feeling of Stress : Not at all  Social Connections: Moderately  Isolated (07/02/2022)   Social Connection and Isolation Panel [NHANES]    Frequency of Communication with Friends and Family: More than three times a week    Frequency of Social Gatherings with Friends and Family: More than three times a week    Attends Religious Services: Never    Marine scientist or Organizations: No     Attends Music therapist: Never    Marital Status: Married    Tobacco Counseling Counseling given: Not Answered   Clinical Intake:  Pre-visit preparation completed: Yes  Pain : No/denies pain     BMI - recorded: 28.24 Nutritional Status: BMI 25 -29 Overweight Nutritional Risks: None Diabetes: No  How often do you need to have someone help you when you read instructions, pamphlets, or other written materials from your doctor or pharmacy?: 1 - Never  Diabetic?no  Interpreter Needed?: No  Information entered by :: Charlott Rakes, LPN   Activities of Daily Living    07/02/2022   10:41 AM 05/18/2022    2:00 PM  In your present state of health, do you have any difficulty performing the following activities:  Hearing? 0 0  Vision? 0 0  Difficulty concentrating or making decisions? 0 0  Walking or climbing stairs? 1 1  Comment related to hip surgery   Dressing or bathing? 1 0  Comment related to hip surgery   Doing errands, shopping? 1 0  Preparing Food and eating ? Y   Using the Toilet? N   In the past six months, have you accidently leaked urine? N   Do you have problems with loss of bowel control? N   Managing your Medications? N   Managing your Finances? N   Housekeeping or managing your Housekeeping? Y   Comment realted to hip surgery     Patient Care Team: [name], MD as PCP - General (Family Medicine) Madelin Rear, [facility] (Inactive) as Pharmacist (Pharmacist)  Indicate any recent Medical Services you may have received from other than [facility] providers in the past year (date may be approximate).     Assessment:   This is a routine wellness examination for Alison Brooks.  Hearing/Vision screen Hearing Screening - Comments:: Pt denies any hearing issues  Vision Screening - Comments:: Pt encouraged to follow up   Dietary issues and exercise activities discussed: Current Exercise Habits: Structured exercise class, Type of exercise: Other - see  comments (hip suregery PT), Time (Minutes): 45, Frequency (Times/Week): 3, Weekly Exercise (Minutes/Week): 135   Goals Addressed             This Visit's Progress    Patient Stated       Getting better from hip surgery        Depression Screen    07/02/2022   10:39 AM 03/16/2022   12:37 PM 10/02/2021    2:10 PM 10/02/2021    1:18 PM 06/11/2021   10:19 AM 03/31/2021    1:09 PM 02/24/2021    9:57 AM  PHQ 2/9 Scores  PHQ - 2 Score 0 2 0 0 0 0 0  PHQ- 9 Score  '6 1   6 '$ 0    Fall Risk    07/02/2022   10:40 AM 10/02/2021    1:18 PM 06/11/2021   10:21 AM 03/31/2021    1:08 PM 11/24/2020    2:10 PM  [place] in the past year? 1 1 0 0 0  Number falls in past yr: 1  1 0 0 0  Injury with Fall? 0 0 0 0 0  Risk for fall due to : Impaired vision;Impaired balance/gait;Impaired mobility  Impaired vision    Risk for fall due to: Comment releat ed to the hip surgery      Follow up Falls prevention discussed  Falls prevention discussed      FALL RISK PREVENTION PERTAINING TO THE HOME:  Any stairs in or around the home? Yes  If so, are there any without handrails? No  Home free of loose throw rugs in walkways, pet beds, electrical cords, etc? Yes  Adequate lighting in your home to reduce risk of falls? Yes   ASSISTIVE DEVICES UTILIZED TO PREVENT FALLS:  Life alert? No  Use of a cane, walker or w/c? Yes  Grab bars in the bathroom? No  Shower chair or bench in shower? Yes  Elevated toilet seat or a handicapped toilet? Yes   TIMED UP AND GO:  Was the test performed? No .  Cognitive Function:        07/02/2022   10:42 AM 06/11/2021   10:22 AM 06/05/2020   10:29 AM  6CIT Screen  What Year? 0 points 0 points 0 points  What month? 0 points 0 points 0 points  What time? 0 points 0 points   Count back from 20 0 points 0 points 0 points  Months in reverse 0 points 0 points 2 points  Repeat phrase 0 points 0 points 0 points  Total Score 0 points 0 points      Immunizations Immunization History  Administered Date(s) Administered   Fluad Quad(high Dose 65+) 07/17/2019, 10/02/2021   Influenza Whole 08/11/1999, 08/11/2007, 07/25/2009   Influenza,inj,Quad PF,6+ Mos 09/03/2013, 07/12/2014, 08/04/2015   Influenza-Unspecified 07/25/2017   Moderna Sars-Covid-2 Vaccination 11/17/2019, 12/17/2019, 08/28/2020   PNEUMOCOCCAL CONJUGATE-20 10/02/2021   Pneumococcal Polysaccharide-23 07/17/2019   Td 10/25/1998, 10/25/2006   Tdap 04/20/2018    TDAP status: Up to date  Flu Vaccine status: Up to date  Pneumococcal vaccine status: Up to date  Covid-19 vaccine status: Completed vaccines  Qualifies for Shingles Vaccine? Yes   Zostavax completed No   Shingrix Completed?: No.    Education has been provided regarding the importance of this vaccine. Patient has been advised to call insurance company to determine out of pocket expense if they have not yet received this vaccine. Advised may also receive vaccine at local pharmacy or Health Dept. Verbalized acceptance and understanding.  Screening Tests Health Maintenance  Topic Date Due   Zoster Vaccines- Shingrix (1 of 2) Never done   DEXA SCAN  Never done   COVID-19 Vaccine (4 - Moderna series) 10/23/2020   COLONOSCOPY (Pts 45-39yr Insurance coverage will need to be confirmed)  04/07/2022   INFLUENZA VACCINE  05/25/2022   MAMMOGRAM  03/30/2023   TETANUS/TDAP  04/20/2028   Pneumonia Vaccine 66 Years old  Completed   Hepatitis C Screening  Completed   HPV VACCINES  Aged Out    Health Maintenance  Health Maintenance Due  Topic Date Due   Zoster Vaccines- Shingrix (1 of 2) Never done   DEXA SCAN  Never done   COVID-19 Vaccine (4 - Moderna series) 10/23/2020   COLONOSCOPY (Pts 45-466yrInsurance coverage will need to be confirmed)  04/07/2022   INFLUENZA VACCINE  05/25/2022    Colorectal cancer screening: Type of screening: Colonoscopy. Completed 04/07/21. Repeat every 1 years pt stated she  will follow up after hip surgery  recovery   Mammogram  status: Completed 03/29/22. Repeat every year     Additional Screening:  Hepatitis C Screening: Completed 08/04/15  Vision Screening: Recommended annual ophthalmology exams for early detection of glaucoma and other disorders of the eye. Is the patient up to date with their annual eye exam?  No  Who is the provider or what is the name of the office in which the patient attends annual eye exams? Encouraged to follow up  If pt is not established with a provider, would they like to be referred to a provider to establish care? No .   Dental Screening: Recommended annual dental exams for proper oral hygiene  Community Resource Referral / Chronic Care Management: CRR required this visit?  No   CCM required this visit?  No      Plan:     I have personally reviewed and noted the following in the patient's chart:   Medical and social history Use of alcohol, tobacco or illicit drugs  Current medications and supplements including opioid prescriptions. Patient is not currently taking opioid prescriptions. Functional ability and status Nutritional status Physical activity Advanced directives List of other physicians Hospitalizations, surgeries, and ER visits in previous 12 months Vitals Screenings to include cognitive, depression, and falls Referrals and appointments  In addition, I have reviewed and discussed with patient certain preventive protocols, quality metrics, and best practice recommendations. A written personalized care plan for preventive services as well as general preventive health recommendations were provided to patient.     Willette Brace, LPN   04/29/2262   Nurse Notes: none

## 2022-07-02 NOTE — Patient Instructions (Signed)
Alison Brooks , Thank you for taking time to come for your Medicare Wellness Visit. I appreciate your ongoing commitment to your health goals. Please review the following plan we discussed and let me know if I can assist you in the future.   Screening recommendations/referrals: Colonoscopy: pt stated she will follow up  Mammogram: done 03/29/22 repeat every year  Bone Density: due  Recommended yearly ophthalmology/optometry visit for glaucoma screening and checkup Recommended yearly dental visit for hygiene and checkup  Vaccinations: Influenza vaccine: done 10/02/21 repeat every year  Pneumococcal vaccine: Up to date Tdap vaccine: done 04/20/18 repeat every 10 years  Shingles vaccine: Shingrix discussed. Please contact your pharmacy for coverage information.    Covid-19:completed 1/23, 2/22, & 08/28/20   Advanced directives: Advance directive discussed with you today. Even though you declined this today please call our office should you change your mind and we can give you the proper paperwork for you to fill out.  Conditions/risks identified: continue to heal from hip surgery   Next appointment: Follow up in one year for your annual wellness visit    Preventive Care 65 Years and Older, Female Preventive care refers to lifestyle choices and visits with your health care provider that can promote health and wellness. What does preventive care include? A yearly physical exam. This is also called an annual well check. Dental exams once or twice a year. Routine eye exams. Ask your health care provider how often you should have your eyes checked. Personal lifestyle choices, including: Daily care of your teeth and gums. Regular physical activity. Eating a healthy diet. Avoiding tobacco and drug use. Limiting alcohol use. Practicing safe sex. Taking low-dose aspirin every day. Taking vitamin and mineral supplements as recommended by your health care provider. What happens during an annual well  check? The services and screenings done by your health care provider during your annual well check will depend on your age, overall health, lifestyle risk factors, and family history of disease. Counseling  Your health care provider may ask you questions about your: Alcohol use. Tobacco use. Drug use. Emotional well-being. Home and relationship well-being. Sexual activity. Eating habits. History of falls. Memory and ability to understand (cognition). Work and work Statistician. Reproductive health. Screening  You may have the following tests or measurements: Height, weight, and BMI. Blood pressure. Lipid and cholesterol levels. These may be checked every 5 years, or more frequently if you are over 73 years old. Skin check. Lung cancer screening. You may have this screening every year starting at age 27 if you have a 30-pack-year history of smoking and currently smoke or have quit within the past 15 years. Fecal occult blood test (FOBT) of the stool. You may have this test every year starting at age 79. Flexible sigmoidoscopy or colonoscopy. You may have a sigmoidoscopy every 5 years or a colonoscopy every 10 years starting at age 64. Hepatitis C blood test. Hepatitis B blood test. Sexually transmitted disease (STD) testing. Diabetes screening. This is done by checking your blood sugar (glucose) after you have not eaten for a while (fasting). You may have this done every 1-3 years. Bone density scan. This is done to screen for osteoporosis. You may have this done starting at age 40. Mammogram. This may be done every 1-2 years. Talk to your health care provider about how often you should have regular mammograms. Talk with your health care provider about your test results, treatment options, and if necessary, the need for more tests. Vaccines  Your health care provider may recommend certain vaccines, such as: Influenza vaccine. This is recommended every year. Tetanus, diphtheria, and  acellular pertussis (Tdap, Td) vaccine. You may need a Td booster every 10 years. Zoster vaccine. You may need this after age 93. Pneumococcal 13-valent conjugate (PCV13) vaccine. One dose is recommended after age 7. Pneumococcal polysaccharide (PPSV23) vaccine. One dose is recommended after age 75. Talk to your health care provider about which screenings and vaccines you need and how often you need them. This information is not intended to replace advice given to you by your health care provider. Make sure you discuss any questions you have with your health care provider. Document Released: 11/07/2015 Document Revised: 06/30/2016 Document Reviewed: 08/12/2015 Elsevier Interactive Patient Education  2017 [place] Prevention in the Home Falls can cause injuries. They can happen to people of all ages. There are many things you can do to make your home safe and to help prevent falls. What can I do on the outside of my home? Regularly fix the edges of walkways and driveways and fix any cracks. Remove anything that might make you trip as you walk through a door, such as a raised step or threshold. Trim any bushes or trees on the path to your home. Use bright outdoor lighting. Clear any walking paths of anything that might make someone trip, such as rocks or tools. Regularly check to see if handrails are loose or broken. Make sure that both sides of any steps have handrails. Any raised decks and porches should have guardrails on the edges. Have any leaves, snow, or ice cleared regularly. Use sand or salt on walking paths during winter. Clean up any spills in your garage right away. This includes oil or grease spills. What can I do in the bathroom? Use night lights. Install grab bars by the toilet and in the tub and shower. Do not use towel bars as grab bars. Use non-skid mats or decals in the tub or shower. If you need to sit down in the shower, use a plastic, non-slip stool. Keep  the floor dry. Clean up any water that spills on the floor as soon as it happens. Remove soap buildup in the tub or shower regularly. Attach bath mats securely with double-sided non-slip rug tape. Do not have throw rugs and other things on the floor that can make you trip. What can I do in the bedroom? Use night lights. Make sure that you have a light by your bed that is easy to reach. Do not use any sheets or blankets that are too big for your bed. They should not hang down onto the floor. Have a firm chair that has side arms. You can use this for support while you get dressed. Do not have throw rugs and other things on the floor that can make you trip. What can I do in the kitchen? Clean up any spills right away. Avoid walking on wet floors. Keep items that you use a lot in easy-to-reach places. If you need to reach something above you, use a strong step stool that has a grab bar. Keep electrical cords out of the way. Do not use floor polish or wax that makes floors slippery. If you must use wax, use non-skid floor wax. Do not have throw rugs and other things on the floor that can make you trip. What can I do with my stairs? Do not leave any items on the stairs. Make sure that there are  handrails on both sides of the stairs and use them. Fix handrails that are broken or loose. Make sure that handrails are as long as the stairways. Check any carpeting to make sure that it is firmly attached to the stairs. Fix any carpet that is loose or worn. Avoid having throw rugs at the top or bottom of the stairs. If you do have throw rugs, attach them to the floor with carpet tape. Make sure that you have a light switch at the top of the stairs and the bottom of the stairs. If you do not have them, ask someone to add them for you. What else can I do to help prevent falls? Wear shoes that: Do not have high heels. Have rubber bottoms. Are comfortable and fit you well. Are closed at the toe. Do not  wear sandals. If you use a stepladder: Make sure that it is fully opened. Do not climb a closed stepladder. Make sure that both sides of the stepladder are locked into place. Ask someone to hold it for you, if possible. Clearly mark and make sure that you can see: Any grab bars or handrails. First and last steps. Where the edge of each step is. Use tools that help you move around (mobility aids) if they are needed. These include: Canes. Walkers. Scooters. Crutches. Turn on the lights when you go into a dark area. Replace any light bulbs as soon as they burn out. Set up your furniture so you have a clear path. Avoid moving your furniture around. If any of your floors are uneven, fix them. If there are any pets around you, be aware of where they are. Review your medicines with your doctor. Some medicines can make you feel dizzy. This can increase your chance of falling. Ask your doctor what other things that you can do to help prevent falls. This information is not intended to replace advice given to you by your health care provider. Make sure you discuss any questions you have with your health care provider. Document Released: 08/07/2009 Document Revised: 03/18/2016 Document Reviewed: 11/15/2014 Elsevier Interactive Patient Education  2017 [employer].

## 2022-07-06 ENCOUNTER — Ambulatory Visit (HOSPITAL_COMMUNITY): Payer: Medicare Other

## 2022-07-06 DIAGNOSIS — R279 Unspecified lack of coordination: Secondary | ICD-10-CM

## 2022-07-06 DIAGNOSIS — R2689 Other abnormalities of gait and mobility: Secondary | ICD-10-CM

## 2022-07-06 DIAGNOSIS — M6281 Muscle weakness (generalized): Secondary | ICD-10-CM

## 2022-07-06 DIAGNOSIS — R29898 Other symptoms and signs involving the musculoskeletal system: Secondary | ICD-10-CM

## 2022-07-06 DIAGNOSIS — M25551 Pain in right hip: Secondary | ICD-10-CM | POA: Diagnosis not present

## 2022-07-06 NOTE — Therapy (Signed)
OUTPATIENT PHYSICAL THERAPY TREATMENT   Patient Name: Alison Brooks MRN: [id number] DOB:1955/12/01, 66 y.o., female Today's Date: 07/06/2022   END OF SESSION:   PT End of Session - 07/06/22 1058     Visit Number 11    Number of Visits 24    Date for PT Re-Evaluation 08/10/22    Authorization Type [employer] Medicare (no visit limit, no pre auth required)    Progress Note Due on Visit 2    PT Start Time 1035    PT Stop Time 1115    PT Time Calculation (min) 40 min    Activity Tolerance Patient tolerated treatment well    Behavior During Therapy WFL for tasks assessed/performed                    Past Medical History:  Diagnosis Date   Anxiety    Arthritis    back, neck   Chronic back pain    COPD (chronic obstructive pulmonary disease) ([place]) 2021   after Covid   Depression    Dyspnea    Since covid , NOW ON INHALERS   Fatty liver    Fibromyalgia    GERD (gastroesophageal reflux disease)    Heart murmur    Hematuria    History of adenomatous polyp of colon    History of COVID-19 10/2020   History of kidney stones    History of seizure    x1 2007 post op (per pt neurologist work-up done ?mixture of anesthesia medication and prozac that pt had taken)  and has not any issues since    History of squamous cell carcinoma excision    Hyperlipidemia    Hypertension    Hypothyroidism    IC (interstitial cystitis)    Long COVID    Migraine    Osteoporosis    Pulmonary nodules    multiple per ct -- monitored by pcp   Right ureteral stone    RLS (restless legs syndrome)    Past Surgical History:  Procedure Laterality Date   CARDIOVASCULAR STRESS TEST  09/02/2004   normal perfusion study/  normal LV function and wall motion, ef 64%   CARPAL TUNNEL RELEASE Right 1998   CERVICAL FUSION  1992   CHOLECYSTECTOMY  1995   COLONOSCOPY     CYSTO/  BOTOX INJECTION  12-27-2008  &  04-01-2006   CYSTO/  HYDRODISTENTION/  INSTILLATION THERAPY  03/04/2006   CYSTOSCOPY W/  URETERAL STENT PLACEMENT Right 04/02/2016   Procedure: CYSTOSCOPY WITH RIGHT RETROGRADE, URETEROSCOPY PYELOGRAM LASER [place];  Surgeon: Kathie Rhodes, MD;  Location: [facility];  Service: Urology;  Laterality: Right;   HEMORRHOID SURGERY  01/03/2004   HOLMIUM LASER APPLICATION Right 80/99/8338   Procedure: HOLMIUM LASER APPLICATION;  Surgeon: Kathie Rhodes, MD;  Location: [facility];  Service: Urology;  Laterality: Right;   LAPAROSCOPIC RIGHT HEMI COLECTOMY N/A 06/12/2021   Procedure: LAPAROSCOPIC RIGHT HEMI COLECTOMY;  Surgeon: Ileana Roup, MD;  Location: [facility];  Service: General;  Laterality: N/A;   LUMBAR FUSION  1996   L4 --S1   ROTATOR CUFF REPAIR Left 2004   SHOULDER ARTHROSCOPY WITH BICEPS TENDON REPAIR Left 12/2015   SPINAL CORD STIMULATOR IMPLANT  x4  last one 2013   STONE EXTRACTION WITH BASKET Right 04/02/2016   Procedure: STONE EXTRACTION WITH BASKET;  Surgeon: Kathie Rhodes, MD;  Location[facility];  Service: Urology;  Laterality: Right;   TONSILLECTOMY  1964  TOTAL HIP ARTHROPLASTY Right 05/18/2022   Procedure: TOTAL HIP ARTHROPLASTY ANTERIOR APPROACH;  Surgeon: Renette Butters, MD;  Location: [facility];  Service: Orthopedics;  Laterality: Right;   TRANSTHORACIC ECHOCARDIOGRAM  04/17/2014   EF 60-65%/  trivial AR, MR, and TR   UPPER GASTROINTESTINAL ENDOSCOPY     VAGINAL HYSTERECTOMY  1984   WRIST GANGLION EXCISION Right 02/05/2004   Patient Active Problem List   Diagnosis Date Noted   S/P total right hip arthroplasty 05/18/2022   S/P right hemicolectomy 06/12/2021   Aortic atherosclerosis ([place]) 03/31/2021   COPD (chronic obstructive pulmonary disease) ([place]) 11/24/2020   History of total hysterectomy 07/17/2019   Hypothyroidism 07/17/2019   Vertigo 11/06/2016   Dyspnea 11/02/2014   Upper airway cough syndrome 11/02/2014   Gout 06/28/2014   Vitamin B12 deficiency 06/28/2014    Multiple pulmonary nodules 06/28/2014   Essential hypertension 05/17/2014   Obesity (BMI 30-39.9) 01/14/2014   Lateral epicondylitis (tennis elbow) 01/02/2014   GERD (gastroesophageal reflux disease) 03/26/2013   LEG CRAMPS 05/16/2009   RESTLESS LEG SYNDROME, SEVERE 03/14/2009   Chronic interstitial cystitis 01/20/2009   Depression, major, recurrent ([place]) 03/19/2008   TUBULOVILLOUS ADENOMA, COLON 02/17/2008   Hyperlipidemia 02/17/2008   NEPHROLITHIASIS 02/17/2008   Former smoker 12/21/2007   MIGRAINE, CLASSICAL W/O INTRACTABLE MIGRAINE 06/09/2007   SYMPTOM, MEMORY LOSS 06/09/2007   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 Stays sore PERTINENT HISTORY: COPD, Fibromyalgia, Anxiety, depression, hx long covid, osteoporosis, HTN, HLD, hx lumbar fusion, spinal cord stimulator  PAIN:  Are you having pain? Yes: NPRS scale: 2/10 Pain location: R hip  Pain description: tight, sore, achey Aggravating factors: movement, walking Relieving factors: Meds  PRECAUTIONS: None  WEIGHT BEARING RESTRICTIONS No  FALLS:  Has patient fallen in last 6 months? Yes. Number of falls 4-5  LIVING ENVIRONMENT: Lives with: lives with their spouse Lives in: House/apartment Stairs: Yes: External: 2 steps; on right going up Has following equipment at home: Single point cane, Walker - 2 wheeled, and shower chair  OCCUPATION: part time caregiver  PLOF:  Independent  PATIENT GOALS Get better   OBJECTIVE: (objective measures from initial evaluation unless otherwise dated)  PATIENT SURVEYS:  FOTO 15% function  06/29/22: 29% function  COGNITION:  Overall cognitive status: Within functional limits for tasks assessed     SENSATION: WFL   POSTURE: rounded shoulders and forward head  PALPATION: Grossly TTP R hip/quads  LOWER EXTREMITY ROM: WFL for tasks assessed  Active ROM Right eval Left eval  Hip flexion    Hip extension    Hip abduction    Hip adduction    Hip internal rotation    Hip external rotation    Knee flexion    Knee extension    Ankle dorsiflexion    Ankle plantarflexion    Ankle inversion    Ankle eversion     (Blank rows = not tested)  LOWER EXTREMITY MMT:  MMT Right eval Left eval Right 05/31/22 Left  05/31/22 Right 06/29/22 Left 06/29/22  Hip flexion 3- * 4 3* 4+ 4- 5  Hip extension   3* 4- 4-* 4+  Hip abduction   3+* 4+ 4-* 4+  Hip adduction        Hip internal rotation        Hip external rotation        Knee flexion 4 * 5 4+ _0 Knee extension 4 * 5 4+ _1 Ankle dorsiflexion _2 Ankle plantarflexion        Ankle inversion        Ankle eversion         (Blank rows = not tested) *=pain   FUNCTIONAL TESTS:  5 times sit to stand: n/t 2 minute walk test: 200 feet with RW Stairs: able to complete 4 inch stairs with alternating pattern with UE support, greatest difficulty with R hip flexion Transfers: requires UE support, labored  GAIT: Distance walked: 200 feet Assistive device utilized: Walker - 2 wheeled Level of assistance: Modified independence Comments: 2MWT, with RW, antalgic, gait improves with time; able to ambulate without AD with antalgic gait without UE support and requires increased time  Reassessment 05/31/22 5 times sit to stand: 11.4 seconds, labored without UE use.  2 minute walk test: 300 feet with RW Stairs: able to complete 4 inch stairs with alternating  pattern with UE support, greatest difficulty with R hip flexion Transfers: able to complete without UE support, labored GAIT: Distance walked: 300 feet Assistive device utilized: Environmental consultant - 2 wheeled Level of assistance: Modified independence Comments: 2MWT, with RW, antalgic, gait improves with time  Reassessment 06/29/22 5 times sit to stand: 11.3 seconds, labored without UE use, uses LLE>RLE 2 minute walk test: 235 feet with RW Stairs: able to complete 7 inch stairs with alternating pattern with UE support, decreased RLE strength and motor control Transfers: able to complete without UE support, labored GAIT: Distance walked: 235 feet Assistive device utilized: SPC Level of assistance: Modified independence Comments: 2MWT, with SPC, antalgic, decreased R stance time  TODAY'S TREATMENT: 07/06/22 Nustep level 3, seat 4 x 5' Marching 2 x 20 bilateral no UE assist Hip vectors 3" x 10 each 8" step ups 2 x 10 8" lateral step ups 2 x 10 Squats to chair target 2 x 10 Walkouts 3 plates x 5 retro and sidestepping; cga for safety       07/01/22 Nustep Level 3, seat 4, 5 minutes Alternating march 2x 20 bilateral  Standing hip abduction with GTB at knees 2x 10  Standing hip abduction with GTB at ankles 2x 10  Step up 6 inch 2x 10 RLE Lateral step up 6 inch 2x10 RLE Mini squat with UE support 2x 10  TKE 2x 10 5 second holds 3 plates  05/25/84 Nustep Level 1, seat 4, 5 minutes Reassessment  Prone quad stretch 5 x 20 second holds RLE Prone hip extension 2x 10  Step up 6 inch 2x 10 RLE  06/25/22 Nustep Level 1, seat 4, 5 minutes Heel/toe rocks x 20 Slant board 5 x 20"   Prone lying x 1' Prone on elbows x 1' Prone press ups x 10  Hip vectors 3" x 10 each 4" step ups 2 x 10 4" lateral step ups 2 x 10  06/21/22 Alternating march 2x 20 bilateral SLS with vectors 5 x 5 second holds bilateral  Standing hip abduction with GTB at knees 2x  10 bilateral Standing lumbar extension 1x 10  - no change in symptoms Supine LTR 2x 10 bilateral  Bridge 2x 10  Prone hip extension 2x 10 bilateral  Sidelying hip abduction 1 x15 RLE Step up 6 inch 2x 10 RLE Lateral step up 6 inch 2x 10 RLE Lateral stepping 6x 15 feet Nustep Level 1, seat 4, 5 minutes   PATIENT EDUCATION:  Education details:06/21/22 HEP; 05/31/22: Progress made, HEP; 05/24/22 HEP;  Eval: Patient educated on exam findings, POC, scope of PT, HEP, and frequent mobility but not excessive activity. Person educated: Patient Education method: Explanation, Demonstration, and Handouts Education comprehension: verbalized understanding, returned demonstration, verbal cues required, and tactile cues required    HOME EXERCISE PROGRAM: Access Code: [email or web address] 05/31/22 - Sidelying Hip Abduction  - 1 x daily - 7 x weekly - 2-3 sets - 10 reps - Prone Hip Extension  - 1 x daily - 7 x weekly - 2-3 sets - 10 reps  Date: 05/20/2022 - Supine Ankle Pumps  - 3 x daily - 7 x weekly - 1 sets - 20 reps - Supine Gluteal Sets  - 3 x daily - 7 x weekly - 10 reps - 5-10 second hold - Bridge  - 3 x daily - 7 x weekly - 2 sets - 10 reps - Supine Heel Slide with Strap (Mirrored)  - 3 x daily - 7 x weekly - 2 sets - 10 reps  ASSESSMENT:  CLINICAL IMPRESSION: Today's session continued to focus on lower extremity strengthening and mobility.  Added walkouts today; patient with challenge with sidestepping; needs cues for control and step length. Continued with slight antalgic gait; slight decreased stance right lower extremity.  Patient will continue to benefit from physical therapy in order to improve function and reduce impairment.    OBJECTIVE IMPAIRMENTS Abnormal gait, decreased activity tolerance, decreased balance, decreased endurance, decreased mobility, difficulty walking, decreased ROM, decreased strength, increased muscle spasms, impaired flexibility, improper body mechanics, and pain.   ACTIVITY LIMITATIONS carrying, lifting, bending,  standing, squatting, sleeping, stairs, transfers, dressing, locomotion level, and caring for others  PARTICIPATION LIMITATIONS: meal prep, cleaning, laundry, shopping, community activity, and yard work  PERSONAL FACTORS Time since onset of injury/illness/exacerbation and 3+ comorbidities: COPD, Fibromyalgia, Anxiety, depression, hx long covid, osteoporosis, HTN, HLD, hx lumbar fusion, spinal cord stimulator  are also affecting patient's functional outcome.   REHAB POTENTIAL: Good  CLINICAL DECISION MAKING: Stable/uncomplicated  EVALUATION COMPLEXITY: Low   GOALS: Goals reviewed with patient? Yes  SHORT TERM GOALS: Target date: 06/10/2022  Patient will be independent with HEP in order to improve functional outcomes. Baseline:  Goal status: MET  2.  Patient will report at least 25% improvement in symptoms for improved quality of life. Baseline:  Goal status: MET   LONG TERM GOALS: Target date: 07/01/2022  Patient will report at least 75% improvement in symptoms for improved quality of life. Baseline:  Goal status: MET  2.  Patient will improve FOTO score by at least 37 points in order to indicate improved tolerance to activity. Baseline: 7/27 15% function 06/29/22: 29% function Goal status: IN PROGRESS  3.  Patient will be able to navigate standard stairs with reciprocal pattern without compensation in order to demonstrate improved LE strength. Baseline: 05/20/22: 4 inch stairs with UE support, alternating, with compensations 06/29/22 able on 7 inch with compensations Goal status: IN PROGRESS  4.  Patient will be able to ambulate at least 400 feet in 2MWT in order to demonstrate improved  tolerance to activity. Baseline: 200 with RW 05/31/22 300 feet with RW 06/29/22: 235 feet with SPC Goal status: IN PROGRESS  5.  Patient will demonstrate grade of 5/5 MMT grade in all tested musculature as evidence of improved strength to assist with stair ambulation and gait.   Baseline: see  MMT Goal status: IN PROGRESS   PLAN: PT FREQUENCY: 2x/week  PT DURATION: 6 weeks  PLANNED INTERVENTIONS: Therapeutic exercises, Therapeutic activity, Neuromuscular re-education, Balance training, Gait training, Patient/Family education, Joint manipulation, Joint mobilization, Stair training, Orthotic/Fit training, DME instructions, Aquatic Therapy, Dry Needling, Electrical stimulation, Spinal manipulation, Spinal mobilization, Cryotherapy, Moist heat, Compression bandaging, scar mobilization, Splintting, Taping, Traction, Ultrasound, Ionotophoresis 47m/ml Dexamethasone, and Manual therapy   PLAN FOR NEXT SESSION: SPC  L hip mobility and strength, progress as tolerated.  Monitor possible lumbar involvement    11:17 AM, 07/06/22 Uno Esau Small Jermar Colter MPT [facility]  #[phone]

## 2022-07-08 ENCOUNTER — Encounter (HOSPITAL_COMMUNITY): Payer: Medicare Other | Admitting: Physical Therapy

## 2022-07-13 ENCOUNTER — Encounter (HOSPITAL_COMMUNITY): Payer: Self-pay | Admitting: Physical Therapy

## 2022-07-13 ENCOUNTER — Ambulatory Visit (HOSPITAL_COMMUNITY): Payer: Medicare Other | Admitting: Physical Therapy

## 2022-07-13 DIAGNOSIS — R2689 Other abnormalities of gait and mobility: Secondary | ICD-10-CM | POA: Diagnosis not present

## 2022-07-13 DIAGNOSIS — R279 Unspecified lack of coordination: Secondary | ICD-10-CM

## 2022-07-13 DIAGNOSIS — M6281 Muscle weakness (generalized): Secondary | ICD-10-CM | POA: Diagnosis not present

## 2022-07-13 DIAGNOSIS — R29898 Other symptoms and signs involving the musculoskeletal system: Secondary | ICD-10-CM

## 2022-07-13 DIAGNOSIS — M25551 Pain in right hip: Secondary | ICD-10-CM | POA: Diagnosis not present

## 2022-07-13 NOTE — Therapy (Signed)
OUTPATIENT PHYSICAL THERAPY TREATMENT   Patient Name: Alison Brooks MRN: [id number] DOB:12/04/1955, 66 y.o., female Today's Date: 07/13/2022   END OF SESSION:   PT End of Session - 07/13/22 0901     Visit Number 12    Number of Visits 24    Date for PT Re-Evaluation 08/10/22    Authorization Type [employer] Medicare (no visit limit, no pre auth required)    Progress Note Due on Visit 5    PT Start Time 0859    PT Stop Time 0945    PT Time Calculation (min) 46 min    Activity Tolerance Patient tolerated treatment well    Behavior During Therapy WFL for tasks assessed/performed                    Past Medical History:  Diagnosis Date   Anxiety    Arthritis    back, neck   Chronic back pain    COPD (chronic obstructive pulmonary disease) ([place]) 2021   after Covid   Depression    Dyspnea    Since covid , NOW ON INHALERS   Fatty liver    Fibromyalgia    GERD (gastroesophageal reflux disease)    Heart murmur    Hematuria    History of adenomatous polyp of colon    History of COVID-19 10/2020   History of kidney stones    History of seizure    x1 2007 post op (per pt neurologist work-up done ?mixture of anesthesia medication and prozac that pt had taken)  and has not any issues since    History of squamous cell carcinoma excision    Hyperlipidemia    Hypertension    Hypothyroidism    IC (interstitial cystitis)    Long COVID    Migraine    Osteoporosis    Pulmonary nodules    multiple per ct -- monitored by pcp   Right ureteral stone    RLS (restless legs syndrome)    Past Surgical History:  Procedure Laterality Date   CARDIOVASCULAR STRESS TEST  09/02/2004   normal perfusion study/  normal LV function and wall motion, ef 64%   CARPAL TUNNEL RELEASE Right 1998   CERVICAL FUSION  1992   CHOLECYSTECTOMY  1995   COLONOSCOPY     CYSTO/  BOTOX INJECTION  12-27-2008  &  04-01-2006   CYSTO/  HYDRODISTENTION/  INSTILLATION THERAPY  03/04/2006   CYSTOSCOPY W/  URETERAL STENT PLACEMENT Right 04/02/2016   Procedure: CYSTOSCOPY WITH RIGHT RETROGRADE, URETEROSCOPY PYELOGRAM LASER [place];  Surgeon: Kathie Rhodes, MD;  Location: [facility];  Service: Urology;  Laterality: Right;   HEMORRHOID SURGERY  01/03/2004   HOLMIUM LASER APPLICATION Right 04/19/9484   Procedure: HOLMIUM LASER APPLICATION;  Surgeon: Kathie Rhodes, MD;  Location: [facility];  Service: Urology;  Laterality: Right;   LAPAROSCOPIC RIGHT HEMI COLECTOMY N/A 06/12/2021   Procedure: LAPAROSCOPIC RIGHT HEMI COLECTOMY;  Surgeon: Ileana Roup, MD;  Location: [facility];  Service: General;  Laterality: N/A;   LUMBAR FUSION  1996   L4 --S1   ROTATOR CUFF REPAIR Left 2004   SHOULDER ARTHROSCOPY WITH BICEPS TENDON REPAIR Left 12/2015   SPINAL CORD STIMULATOR IMPLANT  x4  last one 2013   STONE EXTRACTION WITH BASKET Right 04/02/2016   Procedure: STONE EXTRACTION WITH BASKET;  Surgeon: Kathie Rhodes, MD;  Location: [facility];  Service: Urology;  Laterality: Right;   TONSILLECTOMY  1964  TOTAL HIP ARTHROPLASTY Right 05/18/2022   Procedure: TOTAL HIP ARTHROPLASTY ANTERIOR APPROACH;  Surgeon: Renette Butters, MD;  Location: [facility];  Service: Orthopedics;  Laterality: Right;   TRANSTHORACIC ECHOCARDIOGRAM  04/17/2014   EF 60-65%/  trivial AR, MR, and TR   UPPER GASTROINTESTINAL ENDOSCOPY     VAGINAL HYSTERECTOMY  1984   WRIST GANGLION EXCISION Right 02/05/2004   Patient Active Problem List   Diagnosis Date Noted   S/P total right hip arthroplasty 05/18/2022   S/P right hemicolectomy 06/12/2021   Aortic atherosclerosis ([place]) 03/31/2021   COPD (chronic obstructive pulmonary disease) ([place]) 11/24/2020   History of total hysterectomy 07/17/2019   Hypothyroidism 07/17/2019   Vertigo 11/06/2016   Dyspnea 11/02/2014   Upper airway cough syndrome 11/02/2014   Gout 06/28/2014   Vitamin B12 deficiency 06/28/2014    Multiple pulmonary nodules 06/28/2014   Essential hypertension 05/17/2014   Obesity (BMI 30-39.9) 01/14/2014   Lateral epicondylitis (tennis elbow) 01/02/2014   GERD (gastroesophageal reflux disease) 03/26/2013   LEG CRAMPS 05/16/2009   RESTLESS LEG SYNDROME, SEVERE 03/14/2009   Chronic interstitial cystitis 01/20/2009   Depression, major, recurrent ([place]) 03/19/2008   TUBULOVILLOUS ADENOMA, COLON 02/17/2008   Hyperlipidemia 02/17/2008   NEPHROLITHIASIS 02/17/2008   Former smoker 12/21/2007   MIGRAINE, CLASSICAL W/O INTRACTABLE MIGRAINE 06/09/2007   SYMPTOM, MEMORY LOSS 06/09/2007   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 Reports her symptoms are doing well today. Using [facility] primarily but using RW when she goes into the community. PERTINENT HISTORY: COPD, Fibromyalgia, Anxiety, depression, hx long covid, osteoporosis, HTN, HLD, hx lumbar fusion, spinal cord stimulator  PAIN:  Are you having pain? Yes: NPRS scale: 1/10 Pain location: R hip  Pain description: tight, sore, achey Aggravating factors: movement, walking Relieving factors: Meds  PRECAUTIONS: None  WEIGHT BEARING RESTRICTIONS No  FALLS:  Has patient fallen in last 6 months? Yes. Number of falls 4-5  LIVING ENVIRONMENT: Lives with: lives with their spouse Lives in: House/apartment Stairs: Yes: External: 2 steps; on right going up Has following equipment at home: Single point cane, Walker - 2 wheeled, and shower  chair  OCCUPATION: part time caregiver  PLOF: Independent  PATIENT GOALS Get better   OBJECTIVE: (objective measures from initial evaluation unless otherwise dated)  PATIENT SURVEYS:  FOTO 15% function  06/29/22: 29% function  COGNITION:  Overall cognitive status: Within functional limits for tasks assessed     SENSATION: WFL   POSTURE: rounded shoulders and forward head  PALPATION: Grossly TTP R hip/quads  LOWER EXTREMITY ROM: WFL for tasks assessed  Active ROM Right eval Left eval  Hip flexion    Hip extension    Hip abduction    Hip adduction    Hip internal rotation    Hip external rotation    Knee flexion    Knee extension    Ankle dorsiflexion    Ankle plantarflexion    Ankle inversion    Ankle eversion     (Blank rows = not tested)  LOWER EXTREMITY MMT:  MMT Right eval Left eval Right 05/31/22 Left  05/31/22 Right 06/29/22 Left 06/29/22  Hip flexion 3- * 4 3* 4+ 4- 5  Hip extension   3* 4- 4-* 4+  Hip abduction   3+* 4+ 4-* 4+  Hip adduction        Hip internal rotation        Hip external rotation        Knee flexion 4 * 5 4+ _0 Knee extension 4 * 5 4+ _1 Ankle dorsiflexion _2 Ankle plantarflexion        Ankle inversion        Ankle eversion         (Blank rows = not tested) *=pain   FUNCTIONAL TESTS:  5 times sit to stand: n/t 2 minute walk test: 200 feet with RW Stairs: able to complete 4 inch stairs with alternating pattern with UE support, greatest difficulty with R hip flexion Transfers: requires UE support, labored  GAIT: Distance walked: 200 feet Assistive device utilized: Walker - 2 wheeled Level of assistance: Modified independence Comments: 2MWT, with RW, antalgic, gait improves with time; able to ambulate without AD with antalgic gait without UE support and requires increased time  Reassessment 05/31/22 5 times sit to stand: 11.4 seconds, labored without UE use.  2 minute walk test: 300 feet with RW Stairs:  able to complete 4 inch stairs with alternating pattern with UE support, greatest difficulty with R hip flexion Transfers: able to complete without UE support, labored GAIT: Distance walked: 300 feet Assistive device utilized: Environmental consultant - 2 wheeled Level of assistance: Modified independence Comments: 2MWT, with RW, antalgic, gait improves with time  Reassessment 06/29/22 5 times sit to stand: 11.3 seconds, labored without UE use, uses LLE>RLE 2 minute walk test: 235 feet with RW Stairs: able to complete 7 inch stairs with alternating pattern with UE support, decreased RLE strength and motor control Transfers: able to complete without UE support, labored GAIT: Distance walked: 235 feet Assistive device utilized: SPC Level of assistance: Modified independence Comments: 2MWT, with SPC, antalgic, decreased R stance time  TODAY'S TREATMENT: 07/13/22 Nustep level 3, seat 4 x 5' Marching 2 x 20 bilateral no UE assist Hip vectors 3#  2x10 each (abd/ext/flex) 8"  Runner step ups 2 x 10 8" lateral step ups 2 x 10 Squats to chair target holding Red meb ball 2 x 10 Walkouts 3 plates x 5 retro and sidestepping; cga for safety     07/06/22 Nustep level 3, seat 4 x 5' Marching 2 x 20 bilateral no UE assist Hip vectors 3" x 10 each 8" step ups 2 x 10 8" lateral step ups 2 x 10 Squats to chair target 2 x 10 Walkouts 3 plates x 5 retro and sidestepping; cga for safety   07/01/22 Nustep Level 3, seat 4, 5 minutes Alternating march 2x 20 bilateral  Standing hip abduction with GTB at knees 2x 10  Standing hip abduction with GTB at ankles 2x 10  Step up 6 inch 2x 10 RLE Lateral step up 6 inch 2x10 RLE Mini squat with UE support 2x 10  TKE 2x 10 5 second holds 3 plates    PATIENT EDUCATION:  Education details:06/21/22 HEP; 05/31/22: Progress made, HEP; 05/24/22 HEP;  Eval: Patient educated on exam findings, POC, scope of PT, HEP, and frequent mobility but not excessive activity. Person educated:  Patient Education method: Explanation, Demonstration, and  Handouts Education comprehension: verbalized understanding, returned demonstration, verbal cues required, and tactile cues required    HOME EXERCISE PROGRAM: Access Code: [email or web address] 05/31/22 - Sidelying Hip Abduction  - 1 x daily - 7 x weekly - 2-3 sets - 10 reps - Prone Hip Extension  - 1 x daily - 7 x weekly - 2-3 sets - 10 reps  Date: 05/20/2022 - Supine Ankle Pumps  - 3 x daily - 7 x weekly - 1 sets - 20 reps - Supine Gluteal Sets  - 3 x daily - 7 x weekly - 10 reps - 5-10 second hold - Bridge  - 3 x daily - 7 x weekly - 2 sets - 10 reps - Supine Heel Slide with Strap (Mirrored)  - 3 x daily - 7 x weekly - 2 sets - 10 reps  ASSESSMENT:  CLINICAL IMPRESSION: Further progression of LE strength and functional mobility. Required moderate UE support with runner step with RLE stepping up. Added load to hip vectors and tolerated well.  Patient will continue to benefit from physical therapy in order to improve function and reduce impairment.    OBJECTIVE IMPAIRMENTS Abnormal gait, decreased activity tolerance, decreased balance, decreased endurance, decreased mobility, difficulty walking, decreased ROM, decreased strength, increased muscle spasms, impaired flexibility, improper body mechanics, and pain.   ACTIVITY LIMITATIONS carrying, lifting, bending, standing, squatting, sleeping, stairs, transfers, dressing, locomotion level, and caring for others  PARTICIPATION LIMITATIONS: meal prep, cleaning, laundry, shopping, community activity, and yard work  PERSONAL FACTORS Time since onset of injury/illness/exacerbation and 3+ comorbidities: COPD, Fibromyalgia, Anxiety, depression, hx long covid, osteoporosis, HTN, HLD, hx lumbar fusion, spinal cord stimulator  are also affecting patient's functional outcome.   REHAB POTENTIAL: Good  CLINICAL DECISION MAKING: Stable/uncomplicated  EVALUATION COMPLEXITY: Low   GOALS: Goals reviewed  with patient? Yes  SHORT TERM GOALS: Target date: 06/10/2022  Patient will be independent with HEP in order to improve functional outcomes. Baseline:  Goal status: MET  2.  Patient will report at least 25% improvement in symptoms for improved quality of life. Baseline:  Goal status: MET   LONG TERM GOALS: Target date: 07/01/2022  Patient will report at least 75% improvement in symptoms for improved quality of life. Baseline:  Goal status: MET  2.  Patient will improve FOTO score by at least 37 points in order to indicate improved tolerance to activity. Baseline: 7/27 15% function 06/29/22: 29% function Goal status: IN PROGRESS  3.  Patient will be able to navigate standard stairs with reciprocal pattern without compensation in order to demonstrate improved LE strength. Baseline: 05/20/22: 4 inch stairs with UE support, alternating, with compensations 06/29/22 able on 7 inch with compensations Goal status: IN PROGRESS  4.  Patient will be able to ambulate at least 400 feet in 2MWT in order to demonstrate improved tolerance to activity. Baseline: 200 with RW 05/31/22 300 feet with RW 06/29/22: 235 feet with SPC Goal status: IN PROGRESS  5.  Patient will demonstrate grade of 5/5 MMT grade in all tested musculature as evidence of improved strength to assist with stair ambulation and gait.   Baseline: see MMT Goal status: IN PROGRESS   PLAN: PT FREQUENCY: 2x/week  PT DURATION: 6 weeks  PLANNED INTERVENTIONS: Therapeutic exercises, Therapeutic activity, Neuromuscular re-education, Balance training, Gait training, Patient/Family education, Joint manipulation, Joint mobilization, Stair training, Orthotic/Fit training, DME instructions, Aquatic Therapy, Dry Needling, Electrical stimulation, Spinal manipulation, Spinal mobilization, Cryotherapy, Moist heat, Compression bandaging, scar mobilization, Splintting, Taping, Traction, Ultrasound, Ionotophoresis  85m/ml Dexamethasone, and Manual  therapy   PLAN FOR NEXT SESSION: SPC  L hip mobility and strength, progress as tolerated.  Monitor possible lumbar involvement    9:45 AM, 07/13/22 LCandie Mile PT, DPT Physical Therapist [facility] [place]

## 2022-07-14 ENCOUNTER — Encounter: Payer: Self-pay | Admitting: Internal Medicine

## 2022-07-15 ENCOUNTER — Ambulatory Visit (HOSPITAL_COMMUNITY): Payer: Medicare Other | Admitting: Physical Therapy

## 2022-07-15 DIAGNOSIS — R2689 Other abnormalities of gait and mobility: Secondary | ICD-10-CM | POA: Diagnosis not present

## 2022-07-15 DIAGNOSIS — M25551 Pain in right hip: Secondary | ICD-10-CM | POA: Diagnosis not present

## 2022-07-15 DIAGNOSIS — M6281 Muscle weakness (generalized): Secondary | ICD-10-CM

## 2022-07-15 DIAGNOSIS — R279 Unspecified lack of coordination: Secondary | ICD-10-CM | POA: Diagnosis not present

## 2022-07-15 DIAGNOSIS — R29898 Other symptoms and signs involving the musculoskeletal system: Secondary | ICD-10-CM | POA: Diagnosis not present

## 2022-07-15 NOTE — Therapy (Signed)
OUTPATIENT PHYSICAL THERAPY TREATMENT   Patient Name: Alison Brooks MRN: [id number] DOB:1956-05-10, 66 y.o., female Today's Date: 07/15/2022   END OF SESSION:   PT End of Session - 07/15/22 0905     Visit Number 13    Number of Visits 24    Date for PT Re-Evaluation 08/10/22    Authorization Type [employer] Medicare (no visit limit, no pre auth required)    Progress Note Due on Visit 44    PT Start Time 0905    PT Stop Time 0945    PT Time Calculation (min) 40 min    Activity Tolerance Patient tolerated treatment well    Behavior During Therapy WFL for tasks assessed/performed                    Past Medical History:  Diagnosis Date   Anxiety    Arthritis    back, neck   Chronic back pain    COPD (chronic obstructive pulmonary disease) ([place]) 2021   after Covid   Depression    Dyspnea    Since covid , NOW ON INHALERS   Fatty liver    Fibromyalgia    GERD (gastroesophageal reflux disease)    Heart murmur    Hematuria    History of adenomatous polyp of colon    History of COVID-19 10/2020   History of kidney stones    History of seizure    x1 2007 post op (per pt neurologist work-up done ?mixture of anesthesia medication and prozac that pt had taken)  and has not any issues since    History of squamous cell carcinoma excision    Hyperlipidemia    Hypertension    Hypothyroidism    IC (interstitial cystitis)    Long COVID    Migraine    Osteoporosis    Pulmonary nodules    multiple per ct -- monitored by pcp   Right ureteral stone    RLS (restless legs syndrome)    Past Surgical History:  Procedure Laterality Date   CARDIOVASCULAR STRESS TEST  09/02/2004   normal perfusion study/  normal LV function and wall motion, ef 64%   CARPAL TUNNEL RELEASE Right 1998   CERVICAL FUSION  1992   CHOLECYSTECTOMY  1995   COLONOSCOPY     CYSTO/  BOTOX INJECTION  12-27-2008  &  04-01-2006   CYSTO/  HYDRODISTENTION/  INSTILLATION THERAPY  03/04/2006   CYSTOSCOPY W/  URETERAL STENT PLACEMENT Right 04/02/2016   Procedure: CYSTOSCOPY WITH RIGHT RETROGRADE, URETEROSCOPY PYELOGRAM LASER [place];  Surgeon: Kathie Rhodes, MD;  Location: [place];  Service: Urology;  Laterality: Right;   HEMORRHOID SURGERY  01/03/2004   HOLMIUM LASER APPLICATION Right 25/02/3975   Procedure: HOLMIUM LASER APPLICATION;  Surgeon: Kathie Rhodes, MD;  Location: [facility];  Service: Urology;  Laterality: Right;   LAPAROSCOPIC RIGHT HEMI COLECTOMY N/A 06/12/2021   Procedure: LAPAROSCOPIC RIGHT HEMI COLECTOMY;  Surgeon: Ileana Roup, MD;  Location: [facility];  Service: General;  Laterality: N/A;   LUMBAR FUSION  1996   L4 --S1   ROTATOR CUFF REPAIR Left 2004   SHOULDER ARTHROSCOPY WITH BICEPS TENDON REPAIR Left 12/2015   SPINAL CORD STIMULATOR IMPLANT  x4  last one 2013   STONE EXTRACTION WITH BASKET Right 04/02/2016   Procedure: STONE EXTRACTION WITH BASKET;  Surgeon: Kathie Rhodes, MD;  Location: [facility];  Service: Urology;  Laterality: Right;   TONSILLECTOMY  1964  TOTAL HIP ARTHROPLASTY Right 05/18/2022   Procedure: TOTAL HIP ARTHROPLASTY ANTERIOR APPROACH;  Surgeon: Renette Butters, MD;  Location: [facility];  Service: Orthopedics;  Laterality: Right;   TRANSTHORACIC ECHOCARDIOGRAM  04/17/2014   EF 60-65%/  trivial AR, MR, and TR   UPPER GASTROINTESTINAL ENDOSCOPY     VAGINAL HYSTERECTOMY  1984   WRIST GANGLION EXCISION Right 02/05/2004   Patient Active Problem List   Diagnosis Date Noted   S/P total right hip arthroplasty 05/18/2022   S/P right hemicolectomy 06/12/2021   Aortic atherosclerosis ([place]) 03/31/2021   COPD (chronic obstructive pulmonary disease) ([place]) 11/24/2020   History of total hysterectomy 07/17/2019   Hypothyroidism 07/17/2019   Vertigo 11/06/2016   Dyspnea 11/02/2014   Upper airway cough syndrome 11/02/2014   Gout 06/28/2014   Vitamin B12 deficiency 06/28/2014    Multiple pulmonary nodules 06/28/2014   Essential hypertension 05/17/2014   Obesity (BMI 30-39.9) 01/14/2014   Lateral epicondylitis (tennis elbow) 01/02/2014   GERD (gastroesophageal reflux disease) 03/26/2013   LEG CRAMPS 05/16/2009   RESTLESS LEG SYNDROME, SEVERE 03/14/2009   Chronic interstitial cystitis 01/20/2009   Depression, major, recurrent ([place]) 03/19/2008   TUBULOVILLOUS ADENOMA, COLON 02/17/2008   Hyperlipidemia 02/17/2008   NEPHROLITHIASIS 02/17/2008   Former smoker 12/21/2007   MIGRAINE, CLASSICAL W/O INTRACTABLE MIGRAINE 06/09/2007   SYMPTOM, MEMORY LOSS 06/09/2007   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 Reports she had a little soreness after last visit but overall doing good.  Minimal pain today. Using [facility] primarily but using RW when she goes into the community.  PERTINENT HISTORY: COPD, Fibromyalgia, Anxiety, depression, hx long covid, osteoporosis, HTN, HLD, hx lumbar fusion, spinal cord stimulator  PAIN:  Are you having pain? Yes: NPRS scale: 1/10 Pain location: R hip  Pain description: tight, sore, achey Aggravating factors: movement, walking Relieving factors: Meds  PRECAUTIONS: None  WEIGHT BEARING RESTRICTIONS No  FALLS:  Has patient fallen in last 6 months? Yes. Number of falls 4-5  LIVING ENVIRONMENT: Lives with: lives with their spouse Lives in: House/apartment Stairs: Yes: External: 2 steps; on right going up Has following equipment at home: Single point cane, Walker - 2 wheeled, and shower  chair  OCCUPATION: part time caregiver  PLOF: Independent  PATIENT GOALS Get better   OBJECTIVE: (objective measures from initial evaluation unless otherwise dated)  PATIENT SURVEYS:  FOTO 15% function  06/29/22: 29% function  COGNITION:  Overall cognitive status: Within functional limits for tasks assessed     SENSATION: WFL   POSTURE: rounded shoulders and forward head  PALPATION: Grossly TTP R hip/quads  LOWER EXTREMITY ROM: WFL for tasks assessed  Active ROM Right eval Left eval  Hip flexion    Hip extension    Hip abduction    Hip adduction    Hip internal rotation    Hip external rotation    Knee flexion    Knee extension    Ankle dorsiflexion    Ankle plantarflexion    Ankle inversion    Ankle eversion     (Blank rows = not tested)  LOWER EXTREMITY MMT:  MMT Right eval Left eval Right 05/31/22 Left  05/31/22 Right 06/29/22 Left 06/29/22  Hip flexion 3- * 4 3* 4+ 4- 5  Hip extension   3* 4- 4-* 4+  Hip abduction   3+* 4+ 4-* 4+  Hip adduction        Hip internal rotation        Hip external rotation        Knee flexion 4 * 5 4+ _0 Knee extension 4 * 5 4+ _1 Ankle dorsiflexion _2 Ankle plantarflexion        Ankle inversion        Ankle eversion         (Blank rows = not tested) *=pain   FUNCTIONAL TESTS:  5 times sit to stand: n/t 2 minute walk test: 200 feet with RW Stairs: able to complete 4 inch stairs with alternating pattern with UE support, greatest difficulty with R hip flexion Transfers: requires UE support, labored  GAIT: Distance walked: 200 feet Assistive device utilized: Walker - 2 wheeled Level of assistance: Modified independence Comments: 2MWT, with RW, antalgic, gait improves with time; able to ambulate without AD with antalgic gait without UE support and requires increased time  Reassessment 05/31/22 5 times sit to stand: 11.4 seconds, labored without UE use.  2 minute walk test: 300 feet with RW Stairs:  able to complete 4 inch stairs with alternating pattern with UE support, greatest difficulty with R hip flexion Transfers: able to complete without UE support, labored GAIT: Distance walked: 300 feet Assistive device utilized: Environmental consultant - 2 wheeled Level of assistance: Modified independence Comments: 2MWT, with RW, antalgic, gait improves with time  Reassessment 06/29/22 5 times sit to stand: 11.3 seconds, labored without UE use, uses LLE>RLE 2 minute walk test: 235 feet with RW Stairs: able to complete 7 inch stairs with alternating pattern with UE support, decreased RLE strength and motor control Transfers: able to complete without UE support, labored GAIT: Distance walked: 235 feet Assistive device utilized: SPC Level of assistance: Modified independence Comments: 2MWT, with SPC, antalgic, decreased R stance time  TODAY'S TREATMENT: 07/15/22 Nustep level 3, seat 4 x 5' Marching 2 x 20 bilateral no UE assist Hip vectors 3#  2x10 each (abd/ext/flex) 7"  Runner step ups 2 x 10 7" lateral step ups 2 x 10 Squats to chair 2 x 10 Walkouts 3 plates x 5 retro and sidestepping; cga for safety Side stepping down blue line 4RT with GTB at thighs  07/13/22 Nustep level 3, seat 4 x 5' Marching 2 x 20 bilateral no UE assist Hip vectors 3#  2x10 each (abd/ext/flex) 8"  Runner step ups 2 x 10 8" lateral step ups 2 x 10 Squats to chair target holding Red meb ball 2 x 10 Walkouts 3 plates x 5 retro and sidestepping; cga for safety  07/06/22 Nustep level 3, seat 4 x 5' Marching 2 x 20 bilateral no UE assist Hip vectors 3" x 10 each 8" step ups 2 x 10 8" lateral step ups 2 x 10 Squats to chair target 2 x 10 Walkouts 3 plates x 5 retro and sidestepping; cga for safety  07/01/22 Nustep Level 3, seat 4, 5 minutes Alternating march 2x 20 bilateral  Standing hip abduction with GTB at knees 2x 10  Standing hip abduction with GTB at ankles 2x 10  Step up 6 inch 2x 10 RLE  Lateral step up 6 inch  2x10 RLE Mini squat with UE support 2x 10  TKE 2x 10 5 second holds 3 plates    PATIENT EDUCATION:  Education details:06/21/22 HEP; 05/31/22: Progress made, HEP; 05/24/22 HEP;  Eval: Patient educated on exam findings, POC, scope of PT, HEP, and frequent mobility but not excessive activity. Person educated: Patient Education method: Explanation, Demonstration, and Handouts Education comprehension: verbalized understanding, returned demonstration, verbal cues required, and tactile cues required    HOME EXERCISE PROGRAM: Access Code: [email or web address] 05/31/22 - Sidelying Hip Abduction  - 1 x daily - 7 x weekly - 2-3 sets - 10 reps - Prone Hip Extension  - 1 x daily - 7 x weekly - 2-3 sets - 10 reps  Date: 05/20/2022 - Supine Ankle Pumps  - 3 x daily - 7 x weekly - 1 sets - 20 reps - Supine Gluteal Sets  - 3 x daily - 7 x weekly - 10 reps - 5-10 second hold - Bridge  - 3 x daily - 7 x weekly - 2 sets - 10 reps - Supine Heel Slide with Strap (Mirrored)  - 3 x daily - 7 x weekly - 2 sets - 10 reps  ASSESSMENT:  CLINICAL IMPRESSION: Continued focus on LE strength and functional mobility. Noted challenge of step ups and fatigue with new side stepping challenge added with GTB resistance. Cues to complete hip vectors activity slower as increases difficulty.  Pt able to complete all activities without complaints of pain.  Improving gait quality and strength. Patient will continue to benefit from physical therapy in order to improve function and reduce impairment.    OBJECTIVE IMPAIRMENTS Abnormal gait, decreased activity tolerance, decreased balance, decreased endurance, decreased mobility, difficulty walking, decreased ROM, decreased strength, increased muscle spasms, impaired flexibility, improper body mechanics, and pain.   ACTIVITY LIMITATIONS carrying, lifting, bending, standing, squatting, sleeping, stairs, transfers, dressing, locomotion level, and caring for others  PARTICIPATION LIMITATIONS: meal  prep, cleaning, laundry, shopping, community activity, and yard work  PERSONAL FACTORS Time since onset of injury/illness/exacerbation and 3+ comorbidities: COPD, Fibromyalgia, Anxiety, depression, hx long covid, osteoporosis, HTN, HLD, hx lumbar fusion, spinal cord stimulator  are also affecting patient's functional outcome.   REHAB POTENTIAL: Good  CLINICAL DECISION MAKING: Stable/uncomplicated  EVALUATION COMPLEXITY: Low   GOALS: Goals reviewed with patient? Yes  SHORT TERM GOALS: Target date: 06/10/2022  Patient will be independent with HEP in order to improve functional outcomes. Baseline:  Goal status: MET  2.  Patient will report at least 25% improvement in symptoms for improved quality of life. Baseline:  Goal status: MET   LONG TERM GOALS: Target date: 07/01/2022  Patient will report at least 75% improvement in symptoms for improved quality of life. Baseline:  Goal status: MET  2.  Patient will improve FOTO score by at least 37 points in order to indicate improved tolerance to activity. Baseline: 7/27 15% function 06/29/22: 29% function Goal status: IN PROGRESS  3.  Patient will be able to navigate standard stairs with reciprocal pattern without compensation in order to demonstrate improved LE strength. Baseline: 05/20/22: 4 inch stairs with UE support, alternating, with compensations 06/29/22 able on 7 inch with compensations Goal status: IN PROGRESS  4.  Patient will be able to ambulate at least 400 feet in 2MWT in order to demonstrate improved tolerance to activity. Baseline: 200 with RW 05/31/22 300 feet with RW 06/29/22: 235 feet with SPC Goal status: IN PROGRESS  5.  Patient will  demonstrate grade of 5/5 MMT grade in all tested musculature as evidence of improved strength to assist with stair ambulation and gait.   Baseline: see MMT Goal status: IN PROGRESS   PLAN: PT FREQUENCY: 2x/week  PT DURATION: 6 weeks  PLANNED INTERVENTIONS: Therapeutic exercises,  Therapeutic activity, Neuromuscular re-education, Balance training, Gait training, Patient/Family education, Joint manipulation, Joint mobilization, Stair training, Orthotic/Fit training, DME instructions, Aquatic Therapy, Dry Needling, Electrical stimulation, Spinal manipulation, Spinal mobilization, Cryotherapy, Moist heat, Compression bandaging, scar mobilization, Splintting, Taping, Traction, Ultrasound, Ionotophoresis 23m/ml Dexamethasone, and Manual therapy   PLAN FOR NEXT SESSION:  continue to progress Lt hip strength and stability.     ATeena Irani PTA/CLT CGlenmoraPh: [phone] 9:09 AM, 07/15/22

## 2022-07-19 ENCOUNTER — Encounter: Payer: Self-pay | Admitting: *Deleted

## 2022-07-21 ENCOUNTER — Ambulatory Visit (HOSPITAL_COMMUNITY): Payer: Medicare Other

## 2022-07-21 DIAGNOSIS — R279 Unspecified lack of coordination: Secondary | ICD-10-CM | POA: Diagnosis not present

## 2022-07-21 DIAGNOSIS — R2689 Other abnormalities of gait and mobility: Secondary | ICD-10-CM

## 2022-07-21 DIAGNOSIS — M6281 Muscle weakness (generalized): Secondary | ICD-10-CM

## 2022-07-21 DIAGNOSIS — R29898 Other symptoms and signs involving the musculoskeletal system: Secondary | ICD-10-CM | POA: Diagnosis not present

## 2022-07-21 DIAGNOSIS — M25551 Pain in right hip: Secondary | ICD-10-CM | POA: Diagnosis not present

## 2022-07-21 NOTE — Therapy (Signed)
OUTPATIENT PHYSICAL THERAPY TREATMENT   Patient Name: Alison Brooks MRN: [id number] DOB:Sep 12, 1956, 66 y.o., female Today's Date: 07/21/2022   END OF SESSION:   PT End of Session - 07/21/22 1027     Visit Number 14    Number of Visits 24    Date for PT Re-Evaluation 08/10/22    Authorization Type [employer] Medicare (no visit limit, no pre auth required)    Progress Note Due on Visit 19    PT Start Time 1027    PT Stop Time 1113    PT Time Calculation (min) 46 min    Activity Tolerance Patient tolerated treatment well    Behavior During Therapy WFL for tasks assessed/performed                     Past Medical History:  Diagnosis Date   Anxiety    Arthritis    back, neck   Chronic back pain    COPD (chronic obstructive pulmonary disease) ([place]) 2021   after Covid   Depression    Dyspnea    Since covid , NOW ON INHALERS   Fatty liver    Fibromyalgia    GERD (gastroesophageal reflux disease)    Heart murmur    Hematuria    History of adenomatous polyp of colon    History of COVID-19 10/2020   History of kidney stones    History of seizure    x1 2007 post op (per pt neurologist work-up done ?mixture of anesthesia medication and prozac that pt had taken)  and has not any issues since    History of squamous cell carcinoma excision    Hyperlipidemia    Hypertension    Hypothyroidism    IC (interstitial cystitis)    Long COVID    Migraine    Osteoporosis    Pulmonary nodules    multiple per ct -- monitored by pcp   Right ureteral stone    RLS (restless legs syndrome)    Past Surgical History:  Procedure Laterality Date   CARDIOVASCULAR STRESS TEST  09/02/2004   normal perfusion study/  normal LV function and wall motion, ef 64%   CARPAL TUNNEL RELEASE Right 1998   CERVICAL FUSION  1992   CHOLECYSTECTOMY  1995   COLONOSCOPY     CYSTO/  BOTOX INJECTION  12-27-2008  &  04-01-2006   CYSTO/  HYDRODISTENTION/  INSTILLATION THERAPY  03/04/2006   CYSTOSCOPY  W/ URETERAL STENT PLACEMENT Right 04/02/2016   Procedure: CYSTOSCOPY WITH RIGHT RETROGRADE, URETEROSCOPY PYELOGRAM LASER [place];  Surgeon: Kathie Rhodes, MD;  Location: [facility];  Service: Urology;  Laterality: Right;   HEMORRHOID SURGERY  01/03/2004   HOLMIUM LASER APPLICATION Right 09/32/6712   Procedure: HOLMIUM LASER APPLICATION;  Surgeon: Kathie Rhodes, MD;  Location: [facility];  Service: Urology;  Laterality: Right;   LAPAROSCOPIC RIGHT HEMI COLECTOMY N/A 06/12/2021   Procedure: LAPAROSCOPIC RIGHT HEMI COLECTOMY;  Surgeon: Ileana Roup, MD;  Location: [facility];  Service: General;  Laterality: N/A;   LUMBAR FUSION  1996   L4 --S1   ROTATOR CUFF REPAIR Left 2004   SHOULDER ARTHROSCOPY WITH BICEPS TENDON REPAIR Left 12/2015   SPINAL CORD STIMULATOR IMPLANT  x4  last one 2013   STONE EXTRACTION WITH BASKET Right 04/02/2016   Procedure: STONE EXTRACTION WITH BASKET;  Surgeon: Kathie Rhodes, MD;  Location: [facility];  Service: Urology;  Laterality: Right;   TONSILLECTOMY  [place] ARTHROPLASTY Right 05/18/2022   Procedure: TOTAL HIP ARTHROPLASTY ANTERIOR APPROACH;  Surgeon: Renette Butters, MD;  Location: [facility];  Service: Orthopedics;  Laterality: Right;   TRANSTHORACIC ECHOCARDIOGRAM  04/17/2014   EF 60-65%/  trivial AR, MR, and TR   UPPER GASTROINTESTINAL ENDOSCOPY     VAGINAL HYSTERECTOMY  1984   WRIST GANGLION EXCISION Right 02/05/2004   Patient Active Problem List   Diagnosis Date Noted   S/P total right hip arthroplasty 05/18/2022   S/P right hemicolectomy 06/12/2021   Aortic atherosclerosis ([place]) 03/31/2021   COPD (chronic obstructive pulmonary disease) ([place]) 11/24/2020   History of total hysterectomy 07/17/2019   Hypothyroidism 07/17/2019   Vertigo 11/06/2016   Dyspnea 11/02/2014   Upper airway cough syndrome 11/02/2014   Gout 06/28/2014   Vitamin B12 deficiency 06/28/2014    Multiple pulmonary nodules 06/28/2014   Essential hypertension 05/17/2014   Obesity (BMI 30-39.9) 01/14/2014   Lateral epicondylitis (tennis elbow) 01/02/2014   GERD (gastroesophageal reflux disease) 03/26/2013   LEG CRAMPS 05/16/2009   RESTLESS LEG SYNDROME, SEVERE 03/14/2009   Chronic interstitial cystitis 01/20/2009   Depression, major, recurrent ([place]) 03/19/2008   TUBULOVILLOUS ADENOMA, COLON 02/17/2008   Hyperlipidemia 02/17/2008   NEPHROLITHIASIS 02/17/2008   Former smoker 12/21/2007   MIGRAINE, CLASSICAL W/O INTRACTABLE MIGRAINE 06/09/2007   SYMPTOM, MEMORY LOSS 06/09/2007   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 04/21/2007   FIBROMYALGIA 04/21/2007    PCP: Garret Reddish MD  REFERRING PROVIDER: Renette Butters, MD    REFERRING DIAG: s/p rt ant THR (DOS 05/18/2022)  THERAPY DIAG:  Other abnormalities of gait and mobility  Other symptoms and signs involving the musculoskeletal system  Muscle weakness (generalized)  Pain in right hip  Unspecified lack of coordination  Rationale for Evaluation and Treatment Rehabilitation  ONSET DATE: 05/18/22  SUBJECTIVE:   SUBJECTIVE STATEMENT: Achy and sore today; 2/10 pain  PERTINENT HISTORY: COPD, Fibromyalgia, Anxiety, depression, hx long covid, osteoporosis, HTN, HLD, hx lumbar fusion, spinal cord stimulator  PAIN:  Are you having pain? Yes: NPRS scale: 2/10 Pain location: R hip  Pain description: tight, sore, achey Aggravating factors: movement, walking Relieving factors: Meds  PRECAUTIONS: None  WEIGHT BEARING RESTRICTIONS No  FALLS:  Has patient fallen in last 6 months? Yes. Number of falls 4-5  LIVING ENVIRONMENT: Lives with: lives with their spouse Lives in: House/apartment Stairs: Yes: External: 2 steps; on right going up Has following equipment at home: Single point cane, Walker - 2 wheeled, and shower chair  OCCUPATION: part time caregiver  PLOF: Independent  PATIENT  GOALS Get better   OBJECTIVE: (objective measures from initial evaluation unless otherwise dated)  PATIENT SURVEYS:  FOTO 15% function  06/29/22: 29% function  COGNITION:  Overall cognitive status: Within functional limits for tasks assessed     SENSATION: WFL   POSTURE: rounded shoulders and forward head  PALPATION: Grossly TTP R hip/quads  LOWER EXTREMITY ROM: WFL for tasks assessed  Active ROM Right eval Left eval  Hip flexion    Hip extension    Hip abduction    Hip adduction    Hip internal rotation    Hip external rotation    Knee flexion    Knee extension    Ankle dorsiflexion    Ankle plantarflexion    Ankle inversion    Ankle eversion     (Blank rows = not tested)  LOWER EXTREMITY MMT:  MMT Right eval Left  eval Right 05/31/22 Left  05/31/22 Right 06/29/22 Left 06/29/22  Hip flexion 3- * 4 3* 4+ 4- 5  Hip extension   3* 4- 4-* 4+  Hip abduction   3+* 4+ 4-* 4+  Hip adduction        Hip internal rotation        Hip external rotation        Knee flexion 4 * 5 4+ 5 5 5   Knee extension 4 * 5 4+ 5 5 5   Ankle dorsiflexion 4 5 5 5     Ankle plantarflexion        Ankle inversion        Ankle eversion         (Blank rows = not tested) *=pain   FUNCTIONAL TESTS:  5 times sit to stand: n/t 2 minute walk test: 200 feet with RW Stairs: able to complete 4 inch stairs with alternating pattern with UE support, greatest difficulty with R hip flexion Transfers: requires UE support, labored  GAIT: Distance walked: 200 feet Assistive device utilized: Walker - 2 wheeled Level of assistance: Modified independence Comments: 2MWT, with RW, antalgic, gait improves with time; able to ambulate without AD with antalgic gait without UE support and requires increased time  Reassessment 05/31/22 5 times sit to stand: 11.4 seconds, labored without UE use.  2 minute walk test: 300 feet with RW Stairs: able to complete 4 inch stairs with alternating pattern with UE support,  greatest difficulty with R hip flexion Transfers: able to complete without UE support, labored GAIT: Distance walked: 300 feet Assistive device utilized: Environmental consultant - 2 wheeled Level of assistance: Modified independence Comments: 2MWT, with RW, antalgic, gait improves with time  Reassessment 06/29/22 5 times sit to stand: 11.3 seconds, labored without UE use, uses LLE>RLE 2 minute walk test: 235 feet with RW Stairs: able to complete 7 inch stairs with alternating pattern with UE support, decreased RLE strength and motor control Transfers: able to complete without UE support, labored GAIT: Distance walked: 235 feet Assistive device utilized: SPC Level of assistance: Modified independence Comments: 2MWT, with SPC, antalgic, decreased R stance time  TODAY'S TREATMENT: 07/21/22 Nustep level 4 x 5' seat 3  Standing: Heel/toe raises 2 x 10 Slant board 5 x 20" 8" step 2 x 10 runner step up 8" step lateral step ups 2 x 10 Squat to chair for target 2 x 10 Standing lumbar extension over // bar x 15 3 plate walkouts x 5 each retro and side to side TKE 3 plates x 30  Tandem stance on foam 2 x 20" each        07/15/22 Nustep level 3, seat 4 x 5' Marching 2 x 20 bilateral no UE assist Hip vectors 3#  2x10 each (abd/ext/flex) 7"  Runner step ups 2 x 10 7" lateral step ups 2 x 10 Squats to chair 2 x 10 Walkouts 3 plates x 5 retro and sidestepping; cga for safety Side stepping down blue line 4RT with GTB at thighs  07/13/22 Nustep level 3, seat 4 x 5' Marching 2 x 20 bilateral no UE assist Hip vectors 3#  2x10 each (abd/ext/flex) 8"  Runner step ups 2 x 10 8" lateral step ups 2 x 10 Squats to chair target holding Red meb ball 2 x 10 Walkouts 3 plates x 5 retro and sidestepping; cga for safety  07/06/22 Nustep level 3, seat 4 x 5' Marching 2 x 20 bilateral no UE assist Hip vectors 3"  x 10 each 8" step ups 2 x 10 8" lateral step ups 2 x 10 Squats to chair target 2 x 10 Walkouts  3 plates x 5 retro and sidestepping; cga for safety  07/01/22 Nustep Level 3, seat 4, 5 minutes Alternating march 2x 20 bilateral  Standing hip abduction with GTB at knees 2x 10  Standing hip abduction with GTB at ankles 2x 10  Step up 6 inch 2x 10 RLE Lateral step up 6 inch 2x10 RLE Mini squat with UE support 2x 10  TKE 2x 10 5 second holds 3 plates    PATIENT EDUCATION:  Education details:06/21/22 HEP; 05/31/22: Progress made, HEP; 05/24/22 HEP;  Eval: Patient educated on exam findings, POC, scope of PT, HEP, and frequent mobility but not excessive activity. Person educated: Patient Education method: Explanation, Demonstration, and Handouts Education comprehension: verbalized understanding, returned demonstration, verbal cues required, and tactile cues required    HOME EXERCISE PROGRAM: 07/21/22 tandem stance at counter  Access Code: [email or web address] 05/31/22 - Sidelying Hip Abduction  - 1 x daily - 7 x weekly - 2-3 sets - 10 reps - Prone Hip Extension  - 1 x daily - 7 x weekly - 2-3 sets - 10 reps  Date: 05/20/2022 - Supine Ankle Pumps  - 3 x daily - 7 x weekly - 1 sets - 20 reps - Supine Gluteal Sets  - 3 x daily - 7 x weekly - 10 reps - 5-10 second hold - Bridge  - 3 x daily - 7 x weekly - 2 sets - 10 reps - Supine Heel Slide with Strap (Mirrored)  - 3 x daily - 7 x weekly - 2 sets - 10 reps  ASSESSMENT:  CLINICAL IMPRESSION: Today's session continued to focus on lower extremity mobility and strengthening, balance.  Patient continues with antalgic gait; decreased stance Right lower extremity.  Overall making good progress.  Added tandem stance on foam today to challenge balance; encourage equal weightbearing; added tandem stance to HEP.  Patient will continue to benefit from physical therapy in order to improve function and reduce impairment.    OBJECTIVE IMPAIRMENTS Abnormal gait, decreased activity tolerance, decreased balance, decreased endurance, decreased mobility, difficulty  walking, decreased ROM, decreased strength, increased muscle spasms, impaired flexibility, improper body mechanics, and pain.   ACTIVITY LIMITATIONS carrying, lifting, bending, standing, squatting, sleeping, stairs, transfers, dressing, locomotion level, and caring for others  PARTICIPATION LIMITATIONS: meal prep, cleaning, laundry, shopping, community activity, and yard work  PERSONAL FACTORS Time since onset of injury/illness/exacerbation and 3+ comorbidities: COPD, Fibromyalgia, Anxiety, depression, hx long covid, osteoporosis, HTN, HLD, hx lumbar fusion, spinal cord stimulator  are also affecting patient's functional outcome.   REHAB POTENTIAL: Good  CLINICAL DECISION MAKING: Stable/uncomplicated  EVALUATION COMPLEXITY: Low   GOALS: Goals reviewed with patient? Yes  SHORT TERM GOALS: Target date: 06/10/2022  Patient will be independent with HEP in order to improve functional outcomes. Baseline:  Goal status: MET  2.  Patient will report at least 25% improvement in symptoms for improved quality of life. Baseline:  Goal status: MET   LONG TERM GOALS: Target date: 07/01/2022  Patient will report at least 75% improvement in symptoms for improved quality of life. Baseline:  Goal status: MET  2.  Patient will improve FOTO score by at least 37 points in order to indicate improved tolerance to activity. Baseline: 7/27 15% function 06/29/22: 29% function Goal status: IN PROGRESS  3.  Patient will be able to navigate standard stairs with reciprocal  pattern without compensation in order to demonstrate improved LE strength. Baseline: 05/20/22: 4 inch stairs with UE support, alternating, with compensations 06/29/22 able on 7 inch with compensations Goal status: IN PROGRESS  4.  Patient will be able to ambulate at least 400 feet in 2MWT in order to demonstrate improved tolerance to activity. Baseline: 200 with RW 05/31/22 300 feet with RW 06/29/22: 235 feet with SPC Goal status: IN  PROGRESS  5.  Patient will demonstrate grade of 5/5 MMT grade in all tested musculature as evidence of improved strength to assist with stair ambulation and gait.   Baseline: see MMT Goal status: IN PROGRESS   PLAN: PT FREQUENCY: 2x/week  PT DURATION: 6 weeks  PLANNED INTERVENTIONS: Therapeutic exercises, Therapeutic activity, Neuromuscular re-education, Balance training, Gait training, Patient/Family education, Joint manipulation, Joint mobilization, Stair training, Orthotic/Fit training, DME instructions, Aquatic Therapy, Dry Needling, Electrical stimulation, Spinal manipulation, Spinal mobilization, Cryotherapy, Moist heat, Compression bandaging, scar mobilization, Splintting, Taping, Traction, Ultrasound, Ionotophoresis 28m/ml Dexamethasone, and Manual therapy   PLAN FOR NEXT SESSION:  continue to progress Lt hip strength and stability.  Try step downs; hip hikes.   11:15 AM, 07/21/22 Aliviya Schoeller Small Aizik Reh MPT [facility] [place] #[phone]

## 2022-07-23 ENCOUNTER — Ambulatory Visit (HOSPITAL_COMMUNITY): Payer: Medicare Other | Admitting: Physical Therapy

## 2022-07-26 ENCOUNTER — Ambulatory Visit (HOSPITAL_COMMUNITY): Payer: Medicare Other

## 2022-07-29 ENCOUNTER — Encounter (HOSPITAL_COMMUNITY): Payer: Self-pay | Admitting: Physical Therapy

## 2022-07-29 ENCOUNTER — Ambulatory Visit (HOSPITAL_COMMUNITY): Payer: Medicare Other | Attending: Family Medicine | Admitting: Physical Therapy

## 2022-07-29 DIAGNOSIS — M6281 Muscle weakness (generalized): Secondary | ICD-10-CM

## 2022-07-29 DIAGNOSIS — R2689 Other abnormalities of gait and mobility: Secondary | ICD-10-CM | POA: Diagnosis not present

## 2022-07-29 DIAGNOSIS — R29898 Other symptoms and signs involving the musculoskeletal system: Secondary | ICD-10-CM | POA: Diagnosis not present

## 2022-07-29 DIAGNOSIS — M25551 Pain in right hip: Secondary | ICD-10-CM | POA: Diagnosis not present

## 2022-07-29 DIAGNOSIS — R279 Unspecified lack of coordination: Secondary | ICD-10-CM | POA: Diagnosis not present

## 2022-07-29 DIAGNOSIS — R262 Difficulty in walking, not elsewhere classified: Secondary | ICD-10-CM | POA: Diagnosis not present

## 2022-07-29 NOTE — Therapy (Signed)
OUTPATIENT PHYSICAL THERAPY TREATMENT   Patient Name: Alison Brooks MRN: [id number] DOB:1956/05/26, 66 y.o., female Today's Date: 07/29/2022   END OF SESSION:   PT End of Session - 07/29/22 0904     Visit Number 15    Number of Visits 24    Date for PT Re-Evaluation 08/10/22    Authorization Type [employer] Medicare (no visit limit, no pre auth required)    Progress Note Due on Visit 19    PT Start Time 0906    PT Stop Time 0945    PT Time Calculation (min) 39 min    Equipment Utilized During Treatment Gait belt    Activity Tolerance Patient tolerated treatment well    Behavior During Therapy WFL for tasks assessed/performed                     Past Medical History:  Diagnosis Date   Anxiety    Arthritis    back, neck   Chronic back pain    COPD (chronic obstructive pulmonary disease) ([place]) 2021   after Covid   Depression    Dyspnea    Since covid , NOW ON INHALERS   Fatty liver    Fibromyalgia    GERD (gastroesophageal reflux disease)    Heart murmur    Hematuria    History of adenomatous polyp of colon    History of COVID-19 10/2020   History of kidney stones    History of seizure    x1 2007 post op (per pt neurologist work-up done ?mixture of anesthesia medication and prozac that pt had taken)  and has not any issues since    History of squamous cell carcinoma excision    Hyperlipidemia    Hypertension    Hypothyroidism    IC (interstitial cystitis)    Long COVID    Migraine    Osteoporosis    Pulmonary nodules    multiple per ct -- monitored by pcp   Right ureteral stone    RLS (restless legs syndrome)    Past Surgical History:  Procedure Laterality Date   CARDIOVASCULAR STRESS TEST  09/02/2004   normal perfusion study/  normal LV function and wall motion, ef 64%   CARPAL TUNNEL RELEASE Right 1998   CERVICAL FUSION  1992   CHOLECYSTECTOMY  1995   COLONOSCOPY     CYSTO/  BOTOX INJECTION  12-27-2008  &  04-01-2006   CYSTO/   HYDRODISTENTION/  INSTILLATION THERAPY  03/04/2006   CYSTOSCOPY W/ URETERAL STENT PLACEMENT Right 04/02/2016   Procedure: CYSTOSCOPY WITH RIGHT RETROGRADE, URETEROSCOPY PYELOGRAM LASER [place];  Surgeon: Kathie Rhodes, MD;  Location: [facility];  Service: Urology;  Laterality: Right;   HEMORRHOID SURGERY  01/03/2004   HOLMIUM LASER APPLICATION Right 91/50/5697   Procedure: HOLMIUM LASER APPLICATION;  Surgeon: Kathie Rhodes, MD;  Location: [facility];  Service: Urology;  Laterality: Right;   LAPAROSCOPIC RIGHT HEMI COLECTOMY N/A 06/12/2021   Procedure: LAPAROSCOPIC RIGHT HEMI COLECTOMY;  Surgeon: Ileana Roup, MD;  Location: [facility];  Service: General;  Laterality: N/A;   LUMBAR FUSION  1996   L4 --S1   ROTATOR CUFF REPAIR Left 2004   SHOULDER ARTHROSCOPY WITH BICEPS TENDON REPAIR Left 12/2015   SPINAL CORD STIMULATOR IMPLANT  x4  last one 2013   STONE EXTRACTION WITH BASKET Right 04/02/2016   Procedure: STONE EXTRACTION WITH BASKET;  Surgeon: Kathie Rhodes, MD;  Location: [facility];  Service: Urology;  Laterality: Right;   TONSILLECTOMY  1964   TOTAL HIP ARTHROPLASTY Right 05/18/2022   Procedure: TOTAL HIP ARTHROPLASTY ANTERIOR APPROACH;  Surgeon: Renette Butters, MD;  Location: [facility];  Service: Orthopedics;  Laterality: Right;   TRANSTHORACIC ECHOCARDIOGRAM  04/17/2014   EF 60-65%/  trivial AR, MR, and TR   UPPER GASTROINTESTINAL ENDOSCOPY     VAGINAL HYSTERECTOMY  1984   WRIST GANGLION EXCISION Right 02/05/2004   Patient Active Problem List   Diagnosis Date Noted   S/P total right hip arthroplasty 05/18/2022   S/P right hemicolectomy 06/12/2021   Aortic atherosclerosis ([place]) 03/31/2021   COPD (chronic obstructive pulmonary disease) ([place]) 11/24/2020   History of total hysterectomy 07/17/2019   Hypothyroidism 07/17/2019   Vertigo 11/06/2016   Dyspnea 11/02/2014   Upper airway cough syndrome  11/02/2014   Gout 06/28/2014   Vitamin B12 deficiency 06/28/2014   Multiple pulmonary nodules 06/28/2014   Essential hypertension 05/17/2014   Obesity (BMI 30-39.9) 01/14/2014   Lateral epicondylitis (tennis elbow) 01/02/2014   GERD (gastroesophageal reflux disease) 03/26/2013   LEG CRAMPS 05/16/2009   RESTLESS LEG SYNDROME, SEVERE 03/14/2009   Chronic interstitial cystitis 01/20/2009   Depression, major, recurrent ([place]) 03/19/2008   TUBULOVILLOUS ADENOMA, COLON 02/17/2008   Hyperlipidemia 02/17/2008   NEPHROLITHIASIS 02/17/2008   Former smoker 12/21/2007   MIGRAINE, CLASSICAL W/O INTRACTABLE MIGRAINE 06/09/2007   SYMPTOM, MEMORY LOSS 06/09/2007   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 Still cant get down to the floor and back up. Her HEP is going well. Cant get out of bath independently. She doesn't have the strength to get back up if she goes into bottom cabinets. Trouble with stairs in basement.   PERTINENT HISTORY: COPD, Fibromyalgia, Anxiety, depression, hx long covid, osteoporosis, HTN, HLD, hx lumbar fusion, spinal cord stimulator  PAIN:  Are you having pain? Yes: NPRS scale: 1/10 Pain location: R hip  Pain description: tight, sore, achey Aggravating factors: movement, walking Relieving factors: Meds  PRECAUTIONS: None  WEIGHT BEARING RESTRICTIONS No  FALLS:  Has patient fallen in last 6 months? Yes. Number of falls 4-5  LIVING ENVIRONMENT: Lives with:  lives with their spouse Lives in: House/apartment Stairs: Yes: External: 2 steps; on right going up Has following equipment at home: Single point cane, Walker - 2 wheeled, and shower chair  OCCUPATION: part time caregiver  PLOF: Independent  PATIENT GOALS Get better   OBJECTIVE: (objective measures from initial evaluation unless otherwise dated)  PATIENT SURVEYS:  FOTO 15% function  06/29/22: 29% function  COGNITION:  Overall cognitive status: Within functional limits for tasks assessed     SENSATION: WFL   POSTURE: rounded shoulders and forward head  PALPATION: Grossly TTP R hip/quads  LOWER EXTREMITY ROM: WFL for tasks assessed  Active ROM Right eval Left eval  Hip flexion    Hip extension    Hip abduction    Hip adduction    Hip internal rotation    Hip external rotation  Knee flexion    Knee extension    Ankle dorsiflexion    Ankle plantarflexion    Ankle inversion    Ankle eversion     (Blank rows = not tested)  LOWER EXTREMITY MMT:  MMT Right eval Left eval Right 05/31/22 Left  05/31/22 Right 06/29/22 Left 06/29/22  Hip flexion 3- * 4 3* 4+ 4- 5  Hip extension   3* 4- 4-* 4+  Hip abduction   3+* 4+ 4-* 4+  Hip adduction        Hip internal rotation        Hip external rotation        Knee flexion 4 * 5 4+ _0 Knee extension 4 * 5 4+ _1 Ankle dorsiflexion _2 Ankle plantarflexion        Ankle inversion        Ankle eversion         (Blank rows = not tested) *=pain   FUNCTIONAL TESTS:  5 times sit to stand: n/t 2 minute walk test: 200 feet with RW Stairs: able to complete 4 inch stairs with alternating pattern with UE support, greatest difficulty with R hip flexion Transfers: requires UE support, labored  GAIT: Distance walked: 200 feet Assistive device utilized: Walker - 2 wheeled Level of assistance: Modified independence Comments: 2MWT, with RW, antalgic, gait improves with time; able to ambulate without AD with antalgic  gait without UE support and requires increased time  Reassessment 05/31/22 5 times sit to stand: 11.4 seconds, labored without UE use.  2 minute walk test: 300 feet with RW Stairs: able to complete 4 inch stairs with alternating pattern with UE support, greatest difficulty with R hip flexion Transfers: able to complete without UE support, labored GAIT: Distance walked: 300 feet Assistive device utilized: Environmental consultant - 2 wheeled Level of assistance: Modified independence Comments: 2MWT, with RW, antalgic, gait improves with time  Reassessment 06/29/22 5 times sit to stand: 11.3 seconds, labored without UE use, uses LLE>RLE 2 minute walk test: 235 feet with RW Stairs: able to complete 7 inch stairs with alternating pattern with UE support, decreased RLE strength and motor control Transfers: able to complete without UE support, labored GAIT: Distance walked: 235 feet Assistive device utilized: SPC Level of assistance: Modified independence Comments: 2MWT, with SPC, antalgic, decreased R stance time  TODAY'S TREATMENT: 07/29/22 Step up with knee drive 8 inch 2 x 10 bilateral  Lateral step down 6 inch 1x 10, 4 inch 1x 10 Forward step down 4 inch 2 x 10  Tandem stance on blue foam 2 x 20-30 second holds bilateral  Lunge 2x 10 bilateral  STS from 12 inch box with black foam 2x 5 with R hand on foam  07/21/22 Nustep level 4 x 5' seat 3  Standing: Heel/toe raises 2 x 10 Slant board 5 x 20" 8" step 2 x 10 runner step up 8" step lateral step ups 2 x 10 Squat to chair for target 2 x 10 Standing lumbar extension over // bar x 15 3 plate walkouts x 5 each retro and side to side TKE 3 plates x 30  Tandem stance on foam 2 x 20" each  07/15/22 Nustep level 3, seat 4 x 5' Marching 2 x 20 bilateral no UE assist Hip vectors 3#  2x10 each (abd/ext/flex) 7"  Runner step ups 2 x 10 7" lateral step ups 2 x 10 Squats to chair  2 x 10 Walkouts 3 plates x 5 retro and sidestepping; cga for safety Side  stepping down blue line 4RT with GTB at thighs  07/13/22 Nustep level 3, seat 4 x 5' Marching 2 x 20 bilateral no UE assist Hip vectors 3#  2x10 each (abd/ext/flex) 8"  Runner step ups 2 x 10 8" lateral step ups 2 x 10 Squats to chair target holding Red meb ball 2 x 10 Walkouts 3 plates x 5 retro and sidestepping; cga for safety   PATIENT EDUCATION:  Education details:07/29/22: performing mobility exercises that are difficulty; 06/21/22 HEP; 05/31/22: Progress made, HEP; 05/24/22 HEP;  Eval: Patient educated on exam findings, POC, scope of PT, HEP, and frequent mobility but not excessive activity. Person educated: Patient Education method: Explanation, Demonstration, and Handouts Education comprehension: verbalized understanding, returned demonstration, verbal cues required, and tactile cues required    HOME EXERCISE PROGRAM: Access Code: [email or web address] 10/5- Lunge with Counter Support  - 2 x daily - 7 x weekly - 2 sets - 10 reps  07/21/22 tandem stance at counter  05/31/22 - Sidelying Hip Abduction  - 1 x daily - 7 x weekly - 2-3 sets - 10 reps - Prone Hip Extension  - 1 x daily - 7 x weekly - 2-3 sets - 10 reps  Date: 05/20/2022 - Supine Ankle Pumps  - 3 x daily - 7 x weekly - 1 sets - 20 reps - Supine Gluteal Sets  - 3 x daily - 7 x weekly - 10 reps - 5-10 second hold - Bridge  - 3 x daily - 7 x weekly - 2 sets - 10 reps - Supine Heel Slide with Strap (Mirrored)  - 3 x daily - 7 x weekly - 2 sets - 10 reps  ASSESSMENT:  CLINICAL IMPRESSION: Continued with stair strengthening exercises which are tolerated well but UE support required to complete with good mechanics. Difficulty with lateral step down on 6 inch step and able to complete with heavy UE support. Mechanics improve with 4 inch step. Patient continues to utilize LLE>R and holds R hip in flexion with rest, educated on promoting equal weight bearing and use. Good mechanics with lunge with UE support. Difficulty with STS from lower  surface. Patient will continue to benefit from physical therapy in order to improve function and reduce impairment.   OBJECTIVE IMPAIRMENTS Abnormal gait, decreased activity tolerance, decreased balance, decreased endurance, decreased mobility, difficulty walking, decreased ROM, decreased strength, increased muscle spasms, impaired flexibility, improper body mechanics, and pain.   ACTIVITY LIMITATIONS carrying, lifting, bending, standing, squatting, sleeping, stairs, transfers, dressing, locomotion level, and caring for others  PARTICIPATION LIMITATIONS: meal prep, cleaning, laundry, shopping, community activity, and yard work  PERSONAL FACTORS Time since onset of injury/illness/exacerbation and 3+ comorbidities: COPD, Fibromyalgia, Anxiety, depression, hx long covid, osteoporosis, HTN, HLD, hx lumbar fusion, spinal cord stimulator  are also affecting patient's functional outcome.   REHAB POTENTIAL: Good  CLINICAL DECISION MAKING: Stable/uncomplicated  EVALUATION COMPLEXITY: Low   GOALS: Goals reviewed with patient? Yes  SHORT TERM GOALS: Target date: 06/10/2022  Patient will be independent with HEP in order to improve functional outcomes. Baseline:  Goal status: MET  2.  Patient will report at least 25% improvement in symptoms for improved quality of life. Baseline:  Goal status: MET   LONG TERM GOALS: Target date: 07/01/2022  Patient will report at least 75% improvement in symptoms for improved quality of life. Baseline:  Goal status: MET  2.  Patient will  improve FOTO score by at least 37 points in order to indicate improved tolerance to activity. Baseline: 7/27 15% function 06/29/22: 29% function Goal status: IN PROGRESS  3.  Patient will be able to navigate standard stairs with reciprocal pattern without compensation in order to demonstrate improved LE strength. Baseline: 05/20/22: 4 inch stairs with UE support, alternating, with compensations 06/29/22 able on 7 inch with  compensations Goal status: IN PROGRESS  4.  Patient will be able to ambulate at least 400 feet in 2MWT in order to demonstrate improved tolerance to activity. Baseline: 200 with RW 05/31/22 300 feet with RW 06/29/22: 235 feet with SPC Goal status: IN PROGRESS  5.  Patient will demonstrate grade of 5/5 MMT grade in all tested musculature as evidence of improved strength to assist with stair ambulation and gait.   Baseline: see MMT Goal status: IN PROGRESS   PLAN: PT FREQUENCY: 2x/week  PT DURATION: 6 weeks  PLANNED INTERVENTIONS: Therapeutic exercises, Therapeutic activity, Neuromuscular re-education, Balance training, Gait training, Patient/Family education, Joint manipulation, Joint mobilization, Stair training, Orthotic/Fit training, DME instructions, Aquatic Therapy, Dry Needling, Electrical stimulation, Spinal manipulation, Spinal mobilization, Cryotherapy, Moist heat, Compression bandaging, scar mobilization, Splintting, Taping, Traction, Ultrasound, Ionotophoresis 31m/ml Dexamethasone, and Manual therapy   PLAN FOR NEXT SESSION:  continue to progress Lt hip strength and stability.  Try step downs; hip hikes.  9:05 AM, 07/29/22 AMearl LatinPT, DPT [facility]

## 2022-08-02 ENCOUNTER — Ambulatory Visit (HOSPITAL_COMMUNITY): Payer: Medicare Other

## 2022-08-02 DIAGNOSIS — R262 Difficulty in walking, not elsewhere classified: Secondary | ICD-10-CM | POA: Diagnosis not present

## 2022-08-02 DIAGNOSIS — R2689 Other abnormalities of gait and mobility: Secondary | ICD-10-CM

## 2022-08-02 DIAGNOSIS — M6281 Muscle weakness (generalized): Secondary | ICD-10-CM

## 2022-08-02 DIAGNOSIS — R29898 Other symptoms and signs involving the musculoskeletal system: Secondary | ICD-10-CM

## 2022-08-02 DIAGNOSIS — M25551 Pain in right hip: Secondary | ICD-10-CM | POA: Diagnosis not present

## 2022-08-02 DIAGNOSIS — R279 Unspecified lack of coordination: Secondary | ICD-10-CM

## 2022-08-02 NOTE — Therapy (Signed)
OUTPATIENT PHYSICAL THERAPY TREATMENT   Patient Name: Alison Brooks MRN: [id number] DOB:1956-10-23, 66 y.o., female Today's Date: 08/02/2022   END OF SESSION:   PT End of Session - 08/02/22 1118     Visit Number 16    Number of Visits 24    Date for PT Re-Evaluation 08/10/22    Authorization Type [employer] Medicare (no visit limit, no pre auth required)    Progress Note Due on Visit 19    PT Start Time 1116    PT Stop Time 1200    PT Time Calculation (min) 44 min    Equipment Utilized During Treatment Gait belt    Activity Tolerance Patient tolerated treatment well    Behavior During Therapy WFL for tasks assessed/performed                      Past Medical History:  Diagnosis Date   Anxiety    Arthritis    back, neck   Chronic back pain    COPD (chronic obstructive pulmonary disease) ([place]) 2021   after Covid   Depression    Dyspnea    Since covid , NOW ON INHALERS   Fatty liver    Fibromyalgia    GERD (gastroesophageal reflux disease)    Heart murmur    Hematuria    History of adenomatous polyp of colon    History of COVID-19 10/2020   History of kidney stones    History of seizure    x1 2007 post op (per pt neurologist work-up done ?mixture of anesthesia medication and prozac that pt had taken)  and has not any issues since    History of squamous cell carcinoma excision    Hyperlipidemia    Hypertension    Hypothyroidism    IC (interstitial cystitis)    Long COVID    Migraine    Osteoporosis    Pulmonary nodules    multiple per ct -- monitored by pcp   Right ureteral stone    RLS (restless legs syndrome)    Past Surgical History:  Procedure Laterality Date   CARDIOVASCULAR STRESS TEST  09/02/2004   normal perfusion study/  normal LV function and wall motion, ef 64%   CARPAL TUNNEL RELEASE Right 1998   CERVICAL FUSION  1992   CHOLECYSTECTOMY  1995   COLONOSCOPY     CYSTO/  BOTOX INJECTION  12-27-2008  &  04-01-2006   CYSTO/   HYDRODISTENTION/  INSTILLATION THERAPY  03/04/2006   CYSTOSCOPY W/ URETERAL STENT PLACEMENT Right 04/02/2016   Procedure: CYSTOSCOPY WITH RIGHT RETROGRADE, URETEROSCOPY PYELOGRAM LASER [place];  Surgeon: Kathie Rhodes, MD;  Location: [facility];  Service: Urology;  Laterality: Right;   HEMORRHOID SURGERY  01/03/2004   HOLMIUM LASER APPLICATION Right 63/84/6659   Procedure: HOLMIUM LASER APPLICATION;  Surgeon: Kathie Rhodes, MD;  Location: [facility];  Service: Urology;  Laterality: Right;   LAPAROSCOPIC RIGHT HEMI COLECTOMY N/A 06/12/2021   Procedure: LAPAROSCOPIC RIGHT HEMI COLECTOMY;  Surgeon: Ileana Roup, MD;  Location: [facility];  Service: General;  Laterality: N/A;   LUMBAR FUSION  1996   L4 --S1   ROTATOR CUFF REPAIR Left 2004   SHOULDER ARTHROSCOPY WITH BICEPS TENDON REPAIR Left 12/2015   SPINAL CORD STIMULATOR IMPLANT  x4  last one 2013   STONE EXTRACTION WITH BASKET Right 04/02/2016   Procedure: STONE EXTRACTION WITH BASKET;  Surgeon: Kathie Rhodes, MD;  Location: [facility];  Service: Urology;  Laterality: Right;   TONSILLECTOMY  1964   TOTAL HIP ARTHROPLASTY Right 05/18/2022   Procedure: TOTAL HIP ARTHROPLASTY ANTERIOR APPROACH;  Surgeon: Renette Butters, MD;  Location: [facility];  Service: Orthopedics;  Laterality: Right;   TRANSTHORACIC ECHOCARDIOGRAM  04/17/2014   EF 60-65%/  trivial AR, MR, and TR   UPPER GASTROINTESTINAL ENDOSCOPY     VAGINAL HYSTERECTOMY  1984   WRIST GANGLION EXCISION Right 02/05/2004   Patient Active Problem List   Diagnosis Date Noted   S/P total right hip arthroplasty 05/18/2022   S/P right hemicolectomy 06/12/2021   Aortic atherosclerosis ([place]) 03/31/2021   COPD (chronic obstructive pulmonary disease) ([place]) 11/24/2020   History of total hysterectomy 07/17/2019   Hypothyroidism 07/17/2019   Vertigo 11/06/2016   Dyspnea 11/02/2014   Upper airway cough syndrome  11/02/2014   Gout 06/28/2014   Vitamin B12 deficiency 06/28/2014   Multiple pulmonary nodules 06/28/2014   Essential hypertension 05/17/2014   Obesity (BMI 30-39.9) 01/14/2014   Lateral epicondylitis (tennis elbow) 01/02/2014   GERD (gastroesophageal reflux disease) 03/26/2013   LEG CRAMPS 05/16/2009   RESTLESS LEG SYNDROME, SEVERE 03/14/2009   Chronic interstitial cystitis 01/20/2009   Depression, major, recurrent ([place]) 03/19/2008   TUBULOVILLOUS ADENOMA, COLON 02/17/2008   Hyperlipidemia 02/17/2008   NEPHROLITHIASIS 02/17/2008   Former smoker 12/21/2007   MIGRAINE, CLASSICAL W/O INTRACTABLE MIGRAINE 06/09/2007   SYMPTOM, MEMORY LOSS 06/09/2007   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 Still cant get down to the floor and back up. Her HEP is going well. Cant get out of bath independently. She doesn't have the strength to get back up if she goes into bottom cabinets. Trouble with stairs in basement.   PERTINENT HISTORY: COPD, Fibromyalgia, Anxiety, depression, hx long covid, osteoporosis, HTN, HLD, hx lumbar fusion, spinal cord stimulator  PAIN:  Are you having pain? Yes: NPRS scale: 1/10 Pain location: R hip  Pain description: tight, sore, achey Aggravating factors: movement, walking Relieving factors: Meds  PRECAUTIONS: None  WEIGHT BEARING RESTRICTIONS No  FALLS:  Has patient fallen in last 6 months? Yes. Number of falls  4-5  LIVING ENVIRONMENT: Lives with: lives with their spouse Lives in: House/apartment Stairs: Yes: External: 2 steps; on right going up Has following equipment at home: Single point cane, Walker - 2 wheeled, and shower chair  OCCUPATION: part time caregiver  PLOF: Independent  PATIENT GOALS Get better   OBJECTIVE: (objective measures from initial evaluation unless otherwise dated)  PATIENT SURVEYS:  FOTO 15% function  06/29/22: 29% function  COGNITION:  Overall cognitive status: Within functional limits for tasks assessed     SENSATION: WFL   POSTURE: rounded shoulders and forward head  PALPATION: Grossly TTP R hip/quads  LOWER EXTREMITY ROM: WFL for tasks assessed  Active ROM Right eval Left eval  Hip flexion    Hip extension    Hip abduction    Hip adduction    Hip internal rotation  Hip external rotation    Knee flexion    Knee extension    Ankle dorsiflexion    Ankle plantarflexion    Ankle inversion    Ankle eversion     (Blank rows = not tested)  LOWER EXTREMITY MMT:  MMT Right eval Left eval Right 05/31/22 Left  05/31/22 Right 06/29/22 Left 06/29/22  Hip flexion 3- * 4 3* 4+ 4- 5  Hip extension   3* 4- 4-* 4+  Hip abduction   3+* 4+ 4-* 4+  Hip adduction        Hip internal rotation        Hip external rotation        Knee flexion 4 * 5 4+ 5 5 5   Knee extension 4 * 5 4+ 5 5 5   Ankle dorsiflexion 4 5 5 5     Ankle plantarflexion        Ankle inversion        Ankle eversion         (Blank rows = not tested) *=pain   FUNCTIONAL TESTS:  5 times sit to stand: n/t 2 minute walk test: 200 feet with RW Stairs: able to complete 4 inch stairs with alternating pattern with UE support, greatest difficulty with R hip flexion Transfers: requires UE support, labored  GAIT: Distance walked: 200 feet Assistive device utilized: Walker - 2 wheeled Level of assistance: Modified independence Comments: 2MWT, with RW, antalgic, gait improves with time;  able to ambulate without AD with antalgic gait without UE support and requires increased time  Reassessment 05/31/22 5 times sit to stand: 11.4 seconds, labored without UE use.  2 minute walk test: 300 feet with RW Stairs: able to complete 4 inch stairs with alternating pattern with UE support, greatest difficulty with R hip flexion Transfers: able to complete without UE support, labored GAIT: Distance walked: 300 feet Assistive device utilized: Environmental consultant - 2 wheeled Level of assistance: Modified independence Comments: 2MWT, with RW, antalgic, gait improves with time  Reassessment 06/29/22 5 times sit to stand: 11.3 seconds, labored without UE use, uses LLE>RLE 2 minute walk test: 235 feet with RW Stairs: able to complete 7 inch stairs with alternating pattern with UE support, decreased RLE strength and motor control Transfers: able to complete without UE support, labored GAIT: Distance walked: 235 feet Assistive device utilized: SPC Level of assistance: Modified independence Comments: 2MWT, with SPC, antalgic, decreased R stance time  TODAY'S TREATMENT: 08/02/22 Nustep level 4 seat 3 dynamic warm up  Heel/toe raises 2 x 10 no UE assist 4" box lateral step downs x 1' 4"  box forward step downs x 1' Sit to stand from 12" step 2 x 10  Supine: Piriformis right knee to left shoulder 10" x 10 LTR x 10 Hip IR/ER x 10  Seated piriformis stretch 10" x 10   07/29/22 Step up with knee drive 8 inch 2 x 10 bilateral  Lateral step down 6 inch 1x 10, 4 inch 1x 10 Forward step down 4 inch 2 x 10  Tandem stance on blue foam 2 x 20-30 second holds bilateral  Lunge 2x 10 bilateral  STS from 12 inch box with black foam 2x 5 with R hand on foam  07/21/22 Nustep level 4 x 5' seat 3  Standing: Heel/toe raises 2 x 10 Slant board 5 x 20" 8" step 2 x 10 runner step up 8" step lateral step ups 2 x 10 Squat to chair for target 2 x 10 Standing  lumbar extension over // bar x 15 3 plate walkouts x  5 each retro and side to side TKE 3 plates x 30  Tandem stance on foam 2 x 20" each  07/15/22 Nustep level 3, seat 4 x 5' Marching 2 x 20 bilateral no UE assist Hip vectors 3#  2x10 each (abd/ext/flex) 7"  Runner step ups 2 x 10 7" lateral step ups 2 x 10 Squats to chair 2 x 10 Walkouts 3 plates x 5 retro and sidestepping; cga for safety Side stepping down blue line 4RT with GTB at thighs  07/13/22 Nustep level 3, seat 4 x 5' Marching 2 x 20 bilateral no UE assist Hip vectors 3#  2x10 each (abd/ext/flex) 8"  Runner step ups 2 x 10 8" lateral step ups 2 x 10 Squats to chair target holding Red meb ball 2 x 10 Walkouts 3 plates x 5 retro and sidestepping; cga for safety   PATIENT EDUCATION:  Education details:07/29/22: performing mobility exercises that are difficulty; 06/21/22 HEP; 05/31/22: Progress made, HEP; 05/24/22 HEP;  Eval: Patient educated on exam findings, POC, scope of PT, HEP, and frequent mobility but not excessive activity. Person educated: Patient Education method: Explanation, Demonstration, and Handouts Education comprehension: verbalized understanding, returned demonstration, verbal cues required, and tactile cues required    HOME EXERCISE PROGRAM: Access Code: [email or web address] URL: [email or web address] Date: 08/02/2022 Prepared by: [facility] - Rehab  Exercises - Supine Ankle Pumps  - 3 x daily - 7 x weekly - 1 sets - 20 reps - Supine Gluteal Sets  - 3 x daily - 7 x weekly - 10 reps - 5-10 second hold - Bridge  - 3 x daily - 7 x weekly - 2 sets - 10 reps - Supine Heel Slide with Strap (Mirrored)  - 3 x daily - 7 x weekly - 2 sets - 10 reps - Sit to Stand  - 1 x daily - 7 x weekly - 3 sets - 10 reps - Sidelying Hip Abduction  - 1 x daily - 7 x weekly - 2-3 sets - 10 reps - Prone Hip Extension  - 1 x daily - 7 x weekly - 2-3 sets - 10 reps - Standing Tandem Balance with Counter Support  - 2 x daily - 7 x weekly - 1 sets - 3 reps - 30 sec hold - Lunge with  Counter Support  - 2 x daily - 7 x weekly - 2 sets - 10 reps - Supine Lower Trunk Rotation  - 2 x daily - 7 x weekly - 1 sets - 10 reps - Supine Hip Internal and External Rotation  - 2 x daily - 7 x weekly - 1 sets - 10 reps - Seated Piriformis Stretch with Trunk Bend  - 2 x daily - 7 x weekly - 1 sets - 10 reps - Supine Piriformis Stretch with Leg Straight  - 2 x daily - 7 x weekly - 1 sets - 10 reps  Access Code: [email or web address] 10/5- Lunge with Counter Support  - 2 x daily - 7 x weekly - 2 sets - 10 reps  07/21/22 tandem stance at counter  05/31/22 - Sidelying Hip Abduction  - 1 x daily - 7 x weekly - 2-3 sets - 10 reps - Prone Hip Extension  - 1 x daily - 7 x weekly - 2-3 sets - 10 reps  Date: 05/20/2022 - Supine Ankle Pumps  - 3 x daily - 7 x weekly - 1 sets -  20 reps - Supine Gluteal Sets  - 3 x daily - 7 x weekly - 10 reps - 5-10 second hold - Bridge  - 3 x daily - 7 x weekly - 2 sets - 10 reps - Supine Heel Slide with Strap (Mirrored)  - 3 x daily - 7 x weekly - 2 sets - 10 reps  ASSESSMENT:  CLINICAL IMPRESSION: Today's session focused on trying to increase mobility of the right hip added in some stretching to her HEP.  Patient still has difficulty with sit to stand from low surfaces.    Patient will continue to benefit from physical therapy in order to improve function and reduce impairment.   OBJECTIVE IMPAIRMENTS Abnormal gait, decreased activity tolerance, decreased balance, decreased endurance, decreased mobility, difficulty walking, decreased ROM, decreased strength, increased muscle spasms, impaired flexibility, improper body mechanics, and pain.   ACTIVITY LIMITATIONS carrying, lifting, bending, standing, squatting, sleeping, stairs, transfers, dressing, locomotion level, and caring for others  PARTICIPATION LIMITATIONS: meal prep, cleaning, laundry, shopping, community activity, and yard work  PERSONAL FACTORS Time since onset of injury/illness/exacerbation and 3+  comorbidities: COPD, Fibromyalgia, Anxiety, depression, hx long covid, osteoporosis, HTN, HLD, hx lumbar fusion, spinal cord stimulator  are also affecting patient's functional outcome.   REHAB POTENTIAL: Good  CLINICAL DECISION MAKING: Stable/uncomplicated  EVALUATION COMPLEXITY: Low   GOALS: Goals reviewed with patient? Yes  SHORT TERM GOALS: Target date: 06/10/2022  Patient will be independent with HEP in order to improve functional outcomes. Baseline:  Goal status: MET  2.  Patient will report at least 25% improvement in symptoms for improved quality of life. Baseline:  Goal status: MET   LONG TERM GOALS: Target date: 07/01/2022  Patient will report at least 75% improvement in symptoms for improved quality of life. Baseline:  Goal status: MET  2.  Patient will improve FOTO score by at least 37 points in order to indicate improved tolerance to activity. Baseline: 7/27 15% function 06/29/22: 29% function Goal status: IN PROGRESS  3.  Patient will be able to navigate standard stairs with reciprocal pattern without compensation in order to demonstrate improved LE strength. Baseline: 05/20/22: 4 inch stairs with UE support, alternating, with compensations 06/29/22 able on 7 inch with compensations Goal status: IN PROGRESS  4.  Patient will be able to ambulate at least 400 feet in 2MWT in order to demonstrate improved tolerance to activity. Baseline: 200 with RW 05/31/22 300 feet with RW 06/29/22: 235 feet with SPC Goal status: IN PROGRESS  5.  Patient will demonstrate grade of 5/5 MMT grade in all tested musculature as evidence of improved strength to assist with stair ambulation and gait.   Baseline: see MMT Goal status: IN PROGRESS   PLAN: PT FREQUENCY: 2x/week  PT DURATION: 6 weeks  PLANNED INTERVENTIONS: Therapeutic exercises, Therapeutic activity, Neuromuscular re-education, Balance training, Gait training, Patient/Family education, Joint manipulation, Joint mobilization,  Stair training, Orthotic/Fit training, DME instructions, Aquatic Therapy, Dry Needling, Electrical stimulation, Spinal manipulation, Spinal mobilization, Cryotherapy, Moist heat, Compression bandaging, scar mobilization, Splintting, Taping, Traction, Ultrasound, Ionotophoresis 22m/ml Dexamethasone, and Manual therapy   PLAN FOR NEXT SESSION:  continue to progress Lt hip strength and stability.  hip hikes.  11:19 AM, 08/02/22 Aladdin Kollmann [place] Gerdeman MPT [facility] [place] #[phone]

## 2022-08-03 ENCOUNTER — Ambulatory Visit (AMBULATORY_SURGERY_CENTER): Payer: Self-pay

## 2022-08-03 VITALS — Ht 62.0 in | Wt 145.0 lb

## 2022-08-03 DIAGNOSIS — Z85038 Personal history of other malignant neoplasm of large intestine: Secondary | ICD-10-CM

## 2022-08-03 DIAGNOSIS — Z86010 Personal history of colonic polyps: Secondary | ICD-10-CM

## 2022-08-03 MED ORDER — NA SULFATE-K SULFATE-MG SULF 17.5-3.13-1.6 GM/177ML PO SOLN: 1.0000 | ORAL | 0 refills | Status: DC

## 2022-08-03 NOTE — Progress Notes (Signed)
No egg or soy allergy known to patient  No issues known to pt with past sedation with any surgeries or procedures Patient denies ever being told they had issues or difficulty with intubation  No FH of Malignant Hyperthermia Pt is not on diet pills Pt is not on  home 02  Pt is not on blood thinners  Pt denies issues with constipation  No A fib or A flutter Have any cardiac testing pending--denied Pt instructed to use [email or web address] or GoodRx for a price reduction on prep   PV via phone. Suprep instructions and sample consent mailed to verified address.

## 2022-08-06 ENCOUNTER — Encounter (HOSPITAL_COMMUNITY): Payer: Self-pay

## 2022-08-06 ENCOUNTER — Other Ambulatory Visit: Payer: Self-pay | Admitting: Family Medicine

## 2022-08-06 ENCOUNTER — Ambulatory Visit (HOSPITAL_COMMUNITY): Payer: Medicare Other

## 2022-08-06 DIAGNOSIS — R29898 Other symptoms and signs involving the musculoskeletal system: Secondary | ICD-10-CM | POA: Diagnosis not present

## 2022-08-06 DIAGNOSIS — M6281 Muscle weakness (generalized): Secondary | ICD-10-CM | POA: Diagnosis not present

## 2022-08-06 DIAGNOSIS — R262 Difficulty in walking, not elsewhere classified: Secondary | ICD-10-CM | POA: Diagnosis not present

## 2022-08-06 DIAGNOSIS — R2689 Other abnormalities of gait and mobility: Secondary | ICD-10-CM

## 2022-08-06 DIAGNOSIS — R279 Unspecified lack of coordination: Secondary | ICD-10-CM | POA: Diagnosis not present

## 2022-08-06 DIAGNOSIS — M25551 Pain in right hip: Secondary | ICD-10-CM | POA: Diagnosis not present

## 2022-08-06 NOTE — Therapy (Signed)
OUTPATIENT PHYSICAL THERAPY TREATMENT Progress Note Reporting Period 06/29/22 to 08/06/22  See note below for Objective Data and Assessment of Progress/Goals.   Patient Name: Alison Brooks MRN: [id number] DOB:[birth year], 66 y.o., female Today's Date: 08/06/2022   END OF SESSION:   PT End of Session - 08/06/22 1130     Visit Number 17    Number of Visits 24    Date for PT Re-Evaluation 08/10/22    Authorization Type [employer] Medicare (no visit limit, no pre auth required)    Progress Note Due on Visit 66    PT Start Time 1121    PT Stop Time 1206    PT Time Calculation (min) 45 min    Activity Tolerance Patient tolerated treatment well    Behavior During Therapy WFL for tasks assessed/performed                       Past Medical History:  Diagnosis Date   Anxiety    Arthritis    back, neck   Chronic back pain    COPD (chronic obstructive pulmonary disease) ([place]) 2021   after Covid   Depression    Dyspnea    Since covid , NOW ON INHALERS   Fatty liver    Fibromyalgia    GERD (gastroesophageal reflux disease)    Heart murmur    Hematuria    History of adenomatous polyp of colon    History of COVID-19 10/2020   History of kidney stones    History of seizure    x1 2007 post op (per pt neurologist work-up done ?mixture of anesthesia medication and prozac that pt had taken)  and has not any issues since    History of squamous cell carcinoma excision    Hyperlipidemia    Hypertension    Hypothyroidism    IC (interstitial cystitis)    Long COVID    Migraine    Osteoporosis    Pulmonary nodules    multiple per ct -- monitored by pcp   Right ureteral stone    RLS (restless legs syndrome)    Past Surgical History:  Procedure Laterality Date   CARDIOVASCULAR STRESS TEST  09/02/2004   normal perfusion study/  normal LV function and wall motion, ef 64%   CARPAL TUNNEL RELEASE Right 1998   CERVICAL FUSION  1992   CHOLECYSTECTOMY  1995   COLONOSCOPY      CYSTO/  BOTOX INJECTION  12-27-2008  &  04-01-2006   CYSTO/  HYDRODISTENTION/  INSTILLATION THERAPY  03/04/2006   CYSTOSCOPY W/ URETERAL STENT PLACEMENT Right 04/02/2016   Procedure: CYSTOSCOPY WITH RIGHT RETROGRADE, URETEROSCOPY PYELOGRAM LASER [place];  Surgeon: Kathie Rhodes, MD;  Location: [place] [facility];  Service: Urology;  Laterality: Right;   HEMORRHOID SURGERY  01/03/2004   HOLMIUM LASER APPLICATION Right 15/02/6978   Procedure: HOLMIUM LASER APPLICATION;  Surgeon: Kathie Rhodes, MD;  Location: [facility];  Service: Urology;  Laterality: Right;   LAPAROSCOPIC RIGHT HEMI COLECTOMY N/A 06/12/2021   Procedure: LAPAROSCOPIC RIGHT HEMI COLECTOMY;  Surgeon: Ileana Roup, MD;  Location: [facility];  Service: General;  Laterality: N/A;   LUMBAR FUSION  1996   L4 --S1   ROTATOR CUFF REPAIR Left 2004   SHOULDER ARTHROSCOPY WITH BICEPS TENDON REPAIR Left 12/2015   SPINAL CORD STIMULATOR IMPLANT  x4  last one 2013   STONE EXTRACTION WITH BASKET Right 04/02/2016   Procedure: STONE EXTRACTION WITH BASKET;  Surgeon: Kathie Rhodes, MD;  Location[facility];  Service: Urology;  Laterality: Right;   TONSILLECTOMY  1964   TOTAL HIP ARTHROPLASTY Right 05/18/2022   Procedure: TOTAL HIP ARTHROPLASTY ANTERIOR APPROACH;  Surgeon: Renette Butters, MD;  Location: [facility];  Service: Orthopedics;  Laterality: Right;   TRANSTHORACIC ECHOCARDIOGRAM  04/17/2014   EF 60-65%/  trivial AR, MR, and TR   UPPER GASTROINTESTINAL ENDOSCOPY     VAGINAL HYSTERECTOMY  1984   WRIST GANGLION EXCISION Right 02/05/2004   Patient Active Problem List   Diagnosis Date Noted   S/P total right hip arthroplasty 05/18/2022   S/P right hemicolectomy 06/12/2021   Aortic atherosclerosis ([place]) 03/31/2021   COPD (chronic obstructive pulmonary disease) ([place]) 11/24/2020   History of total hysterectomy 07/17/2019   Hypothyroidism 07/17/2019   Vertigo 11/06/2016    Dyspnea 11/02/2014   Upper airway cough syndrome 11/02/2014   Gout 06/28/2014   Vitamin B12 deficiency 06/28/2014   Multiple pulmonary nodules 06/28/2014   Essential hypertension 05/17/2014   Obesity (BMI 30-39.9) 01/14/2014   Lateral epicondylitis (tennis elbow) 01/02/2014   GERD (gastroesophageal reflux disease) 03/26/2013   LEG CRAMPS 05/16/2009   RESTLESS LEG SYNDROME, SEVERE 03/14/2009   Chronic interstitial cystitis 01/20/2009   Depression, major, recurrent ([place]) 03/19/2008   TUBULOVILLOUS ADENOMA, COLON 02/17/2008   Hyperlipidemia 02/17/2008   NEPHROLITHIASIS 02/17/2008   Former smoker 12/21/2007   MIGRAINE, CLASSICAL W/O INTRACTABLE MIGRAINE 06/09/2007   SYMPTOM, MEMORY LOSS 06/09/2007   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 Still cant get down to the floor and back up. Her HEP is going well. Cant get out of bath independently. She doesn't have the strength to get back up if she goes into bottom cabinets. Trouble with stairs in basement.    Pt states she continues to have soreness in Rt hip and knee.  Continues to have difficulty with standing from low surfaces (bathtub), ascending stairs reciprocal pattern, putting on socks/shoes and getting items from bottom cabinets. PERTINENT HISTORY: COPD, Fibromyalgia, Anxiety, depression, hx long covid, osteoporosis, HTN, HLD, hx lumbar fusion, spinal cord stimulator  PAIN:  Are you having pain? Yes:  NPRS scale: 1/10 Pain location: R hip  Pain description: tight, sore, achey Aggravating factors: movement, walking Relieving factors: Meds  PRECAUTIONS: None  WEIGHT BEARING RESTRICTIONS No  FALLS:  Has patient fallen in last 6 months? Yes. Number of falls 4-5  LIVING ENVIRONMENT: Lives with: lives with their spouse Lives in: House/apartment Stairs: Yes: External: 2 steps; on right going up Has following equipment at home: Single point cane, Walker - 2 wheeled, and shower chair  OCCUPATION: part time caregiver  PLOF: Independent  PATIENT GOALS Get better   OBJECTIVE: (objective measures from initial evaluation unless otherwise dated)  PATIENT SURVEYS:  FOTO 15% function  06/29/22: 29% function 08/06/22: 44%  COGNITION:  Overall cognitive status: Within functional limits for tasks assessed     SENSATION: WFL   POSTURE: rounded shoulders and forward head  PALPATION: Grossly TTP R hip/quads  LOWER EXTREMITY ROM: WFL for tasks assessed  Active ROM Right eval Left eval  Hip flexion    Hip extension    Hip abduction    Hip adduction    Hip internal rotation    Hip external rotation    Knee flexion    Knee extension    Ankle dorsiflexion    Ankle plantarflexion    Ankle inversion    Ankle eversion     (Blank rows = not tested)  LOWER EXTREMITY MMT:  MMT Right eval Left eval Right 05/31/22 Left  05/31/22 Right 06/29/22 Left 06/29/22 Right 08/06/22 Left  08/06/22  Hip flexion 3- * 4 3* 4+ 4- 5 4/5 4-  Hip extension   3* 4- 4-* 4+ 4+ 4+  Hip abduction   3+* 4+ 4-* 4+ 4+ 5/5  Hip adduction          Hip internal rotation          Hip external rotation          Knee flexion 4 * 5 4+ _0 Knee extension 4 * 5 4+ _1 Ankle dorsiflexion _2 Ankle plantarflexion          Ankle inversion          Ankle eversion           (Blank rows = not tested) *=pain   FUNCTIONAL TESTS:  5 times sit to stand: n/t 2 minute walk test: 200 feet with  RW Stairs: able to complete 4 inch stairs with alternating pattern with UE support, greatest difficulty with R hip flexion Transfers: requires UE support, labored  GAIT: Distance walked: 200 feet Assistive device utilized: Walker - 2 wheeled Level of assistance: Modified independence Comments: 2MWT, with RW, antalgic, gait improves with time; able to ambulate without AD with antalgic gait without UE support and requires increased time  Reassessment 05/31/22 5 times sit to stand: 11.4 seconds, labored without UE use.  2 minute walk test: 300 feet with RW Stairs: able to complete 4 inch stairs with alternating pattern with UE support, greatest difficulty with R hip flexion Transfers: able to complete without UE support, labored GAIT: Distance walked: 300 feet Assistive device utilized: Environmental consultant - 2 wheeled Level of assistance: Modified independence Comments: 2MWT, with RW, antalgic, gait improves with time  Reassessment 06/29/22 5 times sit to stand: 11.3 seconds, labored without UE use, uses LLE>RLE 2 minute walk test: 235 feet with RW Stairs: able to complete 7 inch stairs with alternating pattern with UE support, decreased RLE strength and motor control Transfers: able to complete without UE support, labored GAIT: Distance walked: 235 feet Assistive device utilized: SPC Level of assistance: Modified independence Comments: 2MWT, with SPC, antalgic, decreased R stance time  TODAY'S TREATMENT: 08/06/22 2MWT 282 with SPCft with min cueing initially for proper sequence (was pairing with Lt LE) Stairs 5RT reciprocal, 2RT carrying small box up stairs Squats STS from 12in step height with min A Step up 6in step height 15 with 1 HR MMT Supine: SLR  Heelslides to encourage no ER to bent knee up  08/02/22 Nustep level 4 seat 3 dynamic warm up  Heel/toe raises 2 x 10 no UE assist 4" box lateral step downs x 1' 4" box forward step downs x 1' Sit to stand from 12" step 2 x  10  Supine: Piriformis  right knee to left shoulder 10" x 10 LTR x 10 Hip IR/ER x 10  Seated piriformis stretch 10" x 10   07/29/22 Step up with knee drive 8 inch 2 x 10 bilateral  Lateral step down 6 inch 1x 10, 4 inch 1x 10 Forward step down 4 inch 2 x 10  Tandem stance on blue foam 2 x 20-30 second holds bilateral  Lunge 2x 10 bilateral  STS from 12 inch box with black foam 2x 5 with R hand on foam  07/21/22 Nustep level 4 x 5' seat 3  Standing: Heel/toe raises 2 x 10 Slant board 5 x 20" 8" step 2 x 10 runner step up 8" step lateral step ups 2 x 10 Squat to chair for target 2 x 10 Standing lumbar extension over // bar x 15 3 plate walkouts x 5 each retro and side to side TKE 3 plates x 30  Tandem stance on foam 2 x 20" each  07/15/22 Nustep level 3, seat 4 x 5' Marching 2 x 20 bilateral no UE assist Hip vectors 3#  2x10 each (abd/ext/flex) 7"  Runner step ups 2 x 10 7" lateral step ups 2 x 10 Squats to chair 2 x 10 Walkouts 3 plates x 5 retro and sidestepping; cga for safety Side stepping down blue line 4RT with GTB at thighs  07/13/22 Nustep level 3, seat 4 x 5' Marching 2 x 20 bilateral no UE assist Hip vectors 3#  2x10 each (abd/ext/flex) 8"  Runner step ups 2 x 10 8" lateral step ups 2 x 10 Squats to chair target holding Red meb ball 2 x 10 Walkouts 3 plates x 5 retro and sidestepping; cga for safety   PATIENT EDUCATION:  Education details:07/29/22: performing mobility exercises that are difficulty; 06/21/22 HEP; 05/31/22: Progress made, HEP; 05/24/22 HEP;  Eval: Patient educated on exam findings, POC, scope of PT, HEP, and frequent mobility but not excessive activity. Person educated: Patient Education method: Explanation, Demonstration, and Handouts Education comprehension: verbalized understanding, returned demonstration, verbal cues required, and tactile cues required    HOME EXERCISE PROGRAM: Access Code: [email or web address] URL:  [email or web address] Date: 08/02/2022 Prepared by: [facility] - Rehab  Exercises - Supine Ankle Pumps  - 3 x daily - 7 x weekly - 1 sets - 20 reps - Supine Gluteal Sets  - 3 x daily - 7 x weekly - 10 reps - 5-10 second hold - Bridge  - 3 x daily - 7 x weekly - 2 sets - 10 reps - Supine Heel Slide with Strap (Mirrored)  - 3 x daily - 7 x weekly - 2 sets - 10 reps - Sit to Stand  - 1 x daily - 7 x weekly - 3 sets - 10 reps - Sidelying Hip Abduction  - 1 x daily - 7 x weekly - 2-3 sets - 10 reps - Prone Hip Extension  - 1 x daily - 7 x weekly - 2-3 sets - 10 reps - Standing Tandem Balance with Counter Support  - 2 x daily - 7 x weekly - 1 sets - 3 reps - 30 sec hold - Lunge with Counter Support  - 2 x daily - 7 x weekly - 2 sets - 10 reps - Supine Lower Trunk Rotation  - 2 x daily - 7 x weekly - 1 sets - 10 reps - Supine Hip Internal and External Rotation  - 2 x daily - 7 x weekly - 1 sets - 10 reps -  Seated Piriformis Stretch with Trunk Bend  - 2 x daily - 7 x weekly - 1 sets - 10 reps - Supine Piriformis Stretch with Leg Straight  - 2 x daily - 7 x weekly - 1 sets - 10 reps  Access Code: [email or web address] 08/06/22: SLR, marching, walking program  10/5- Lunge with Counter Support  - 2 x daily - 7 x weekly - 2 sets - 10 reps  07/21/22 tandem stance at counter  05/31/22 - Sidelying Hip Abduction  - 1 x daily - 7 x weekly - 2-3 sets - 10 reps - Prone Hip Extension  - 1 x daily - 7 x weekly - 2-3 sets - 10 reps  Date: 05/20/2022 - Supine Ankle Pumps  - 3 x daily - 7 x weekly - 1 sets - 20 reps - Supine Gluteal Sets  - 3 x daily - 7 x weekly - 10 reps - 5-10 second hold - Bridge  - 3 x daily - 7 x weekly - 2 sets - 10 reps - Supine Heel Slide with Strap (Mirrored)  - 3 x daily - 7 x weekly - 2 sets - 10 reps  ASSESSMENT:  CLINICAL IMPRESSION: Pt returns to MD next week.  Reviewed goals with the following findings:  Pt reports 80% improvements since began therapy.  Pt continues to have  difficulty standing from low surfaces, ascending her steep stairs at home and limited mobility getting objects out from surfaces and donning socks and shoes.  Pt with some improvements with increased cadence during 2MWT  and improved gluteal strengthening.  Hip flexion strength less with MMT then when tested last progress note, unsure if true weakness or if tested seated vs supine.  Pt continues to ambulate with SPC, some cueing initially required for proper sequence, reports she can go short distance inside home but continues with cane for safety and pain control.  Reviewed current HEP and encouraged pt to begin walking program to improve activity tolerance and added SLR to address weakness, min cueing to limit knee flexion during exercise.  No report of increased pain through session.  Discussed DC to HEP vs continue, pt would like to continue with reduction in frequency to 1x/week for 4 more weeks to address deficits that continue.    OBJECTIVE IMPAIRMENTS Abnormal gait, decreased activity tolerance, decreased balance, decreased endurance, decreased mobility, difficulty walking, decreased ROM, decreased strength, increased muscle spasms, impaired flexibility, improper body mechanics, and pain.   ACTIVITY LIMITATIONS carrying, lifting, bending, standing, squatting, sleeping, stairs, transfers, dressing, locomotion level, and caring for others  PARTICIPATION LIMITATIONS: meal prep, cleaning, laundry, shopping, community activity, and yard work  PERSONAL FACTORS Time since onset of injury/illness/exacerbation and 3+ comorbidities: COPD, Fibromyalgia, Anxiety, depression, hx long covid, osteoporosis, HTN, HLD, hx lumbar fusion, spinal cord stimulator  are also affecting patient's functional outcome.   REHAB POTENTIAL: Good  CLINICAL DECISION MAKING: Stable/uncomplicated  EVALUATION COMPLEXITY: Low   GOALS: Goals reviewed with patient? Yes  SHORT TERM GOALS: Target date: 06/10/2022  Patient will  be independent with HEP in order to improve functional outcomes. Baseline:  Goal status: MET  2.  Patient will report at least 25% improvement in symptoms for improved quality of life. Baseline:  Goal status: MET   LONG TERM GOALS: Target date: 07/01/2022  Patient will report at least 75% improvement in symptoms for improved quality of life. Baseline:  08/06/22:  Reports improvements by 80% Goal status: MET  2.  Patient will improve FOTO score by  at least 37 points in order to indicate improved tolerance to activity. Baseline: 7/27 15% function 06/29/22: 29% function 08/06/22: 44% function Goal status: IN PROGRESS  3.  Patient will be able to navigate standard stairs with reciprocal pattern without compensation in order to demonstrate improved LE strength. Baseline: 05/20/22: 4 inch stairs with UE support, alternating, with compensations 06/29/22 able on 7 inch with compensations; 08/06/22: Able to complete reciprocal pattern with 1 HR assistance ascending, no HR needed descending; demonstrates increased difficulty carrying box to simulate carrying laundry basket upstairs. Goal status: IN PROGRESS  4.  Patient will be able to ambulate at least 400 feet in 2MWT in order to demonstrate improved tolerance to activity. Baseline: 200 with RW 05/31/22 300 feet with RW 06/29/22: 235 feet with SPC; 08/06/22: 279f with SPC, min cueing for sequence and able to demonstrate appropriate mechanics Goal status: IN PROGRESS  5.  Patient will demonstrate grade of 5/5 MMT grade in all tested musculature as evidence of improved strength to assist with stair ambulation and gait.   Baseline: see MMT Goal status: IN PROGRESS   PLAN: PT FREQUENCY: 1x/week  PT DURATION: 4 weeks  PLANNED INTERVENTIONS: Therapeutic exercises, Therapeutic activity, Neuromuscular re-education, Balance training, Gait training, Patient/Family education, Joint manipulation, Joint mobilization, Stair training, Orthotic/Fit training, DME  instructions, Aquatic Therapy, Dry Needling, Electrical stimulation, Spinal manipulation, Spinal mobilization, Cryotherapy, Moist heat, Compression bandaging, scar mobilization, Splintting, Taping, Traction, Ultrasound, Ionotophoresis 452mml Dexamethasone, and Manual therapy   PLAN FOR NEXT SESSION:  continue to progress Lt hip strength and stability.  hip hikes.  Next session include focus with sit to stand from low surface, squatting to get items out of low cabinets, assist sit to stand from ground to simulate bathtub and mobility to assist with donning socks/shoes.  Add lunges, SLS based activities, and hurdles/stairs to address hip flexion weakness.  F/U with MD on apt 10/18.  CaIhor AustinLPTA/CLT; CBIS [phone] PM, 08/06/22

## 2022-08-09 ENCOUNTER — Encounter (HOSPITAL_COMMUNITY): Payer: Medicare Other | Admitting: Physical Therapy

## 2022-08-10 NOTE — Addendum Note (Signed)
Addended by: Mearl Latin on: 08/10/2022 07:33 AM   Modules accepted: Orders

## 2022-08-11 DIAGNOSIS — M5416 Radiculopathy, lumbar region: Secondary | ICD-10-CM | POA: Diagnosis not present

## 2022-08-13 ENCOUNTER — Ambulatory Visit (HOSPITAL_COMMUNITY): Payer: Medicare Other | Admitting: Physical Therapy

## 2022-08-13 ENCOUNTER — Encounter (HOSPITAL_COMMUNITY): Payer: Self-pay | Admitting: Physical Therapy

## 2022-08-13 DIAGNOSIS — M25551 Pain in right hip: Secondary | ICD-10-CM | POA: Diagnosis not present

## 2022-08-13 DIAGNOSIS — R29898 Other symptoms and signs involving the musculoskeletal system: Secondary | ICD-10-CM | POA: Diagnosis not present

## 2022-08-13 DIAGNOSIS — R262 Difficulty in walking, not elsewhere classified: Secondary | ICD-10-CM

## 2022-08-13 DIAGNOSIS — R279 Unspecified lack of coordination: Secondary | ICD-10-CM | POA: Diagnosis not present

## 2022-08-13 DIAGNOSIS — R2689 Other abnormalities of gait and mobility: Secondary | ICD-10-CM | POA: Diagnosis not present

## 2022-08-13 DIAGNOSIS — M6281 Muscle weakness (generalized): Secondary | ICD-10-CM | POA: Diagnosis not present

## 2022-08-13 NOTE — Therapy (Signed)
OUTPATIENT PHYSICAL THERAPY TREATMENT    Patient Name: Alison Brooks MRN: [id number] DOB:17-May-1956, 66 y.o., female Today's Date: 08/13/2022    END OF SESSION:    PT End of Session - 08/13/22 1123     Visit Number 18    Number of Visits 28    Date for PT Re-Evaluation 09/03/22    Authorization Type [employer] Medicare (no visit limit, no pre auth required)    Progress Note Due on Visit 39    PT Start Time 1115    PT Stop Time 1201    PT Time Calculation (min) 46 min    Activity Tolerance Patient tolerated treatment well    Behavior During Therapy WFL for tasks assessed/performed                             Past Medical History:  Diagnosis Date   Anxiety    Arthritis    back, neck   Chronic back pain    COPD (chronic obstructive pulmonary disease) ([place]) 2021   after Covid   Depression    Dyspnea    Since covid , NOW ON INHALERS   Fatty liver    Fibromyalgia    GERD (gastroesophageal reflux disease)    Heart murmur    Hematuria    History of adenomatous polyp of colon    History of COVID-19 10/2020   History of kidney stones    History of seizure    x1 2007 post op (per pt neurologist work-up done ?mixture of anesthesia medication and prozac that pt had taken)  and has not any issues since    History of squamous cell carcinoma excision    Hyperlipidemia    Hypertension    Hypothyroidism    IC (interstitial cystitis)    Long COVID    Migraine    Osteoporosis    Pulmonary nodules    multiple per ct -- monitored by pcp   Right ureteral stone    RLS (restless legs syndrome)    Past Surgical History:  Procedure Laterality Date   CARDIOVASCULAR STRESS TEST  09/02/2004   normal perfusion study/  normal LV function and wall motion, ef 64%   CARPAL TUNNEL RELEASE Right 1998   CERVICAL FUSION  1992   CHOLECYSTECTOMY  1995   COLONOSCOPY     CYSTO/  BOTOX INJECTION  12-27-2008  &  04-01-2006   CYSTO/  HYDRODISTENTION/  INSTILLATION THERAPY   03/04/2006   CYSTOSCOPY W/ URETERAL STENT PLACEMENT Right 04/02/2016   Procedure: CYSTOSCOPY WITH RIGHT RETROGRADE, URETEROSCOPY PYELOGRAM LASER [place];  Surgeon: Kathie Rhodes, MD;  Location: [facility];  Service: Urology;  Laterality: Right;   HEMORRHOID SURGERY  01/03/2004   HOLMIUM LASER APPLICATION Right 72/06/4708   Procedure: HOLMIUM LASER APPLICATION;  Surgeon: Kathie Rhodes, MD;  Location: [facility];  Service: Urology;  Laterality: Right;   LAPAROSCOPIC RIGHT HEMI COLECTOMY N/A 06/12/2021   Procedure: LAPAROSCOPIC RIGHT HEMI COLECTOMY;  Surgeon: Ileana Roup, MD;  Location: [facility];  Service: General;  Laterality: N/A;   LUMBAR FUSION  1996   L4 --S1   ROTATOR CUFF REPAIR Left 2004   SHOULDER ARTHROSCOPY WITH BICEPS TENDON REPAIR Left 12/2015   SPINAL CORD STIMULATOR IMPLANT  x4  last one 2013   STONE EXTRACTION WITH BASKET Right 04/02/2016   Procedure: STONE EXTRACTION WITH BASKET;  Surgeon: Kathie Rhodes, MD;  Location: [facility];  Service: Urology;  Laterality: Right;   TONSILLECTOMY  1964   TOTAL HIP ARTHROPLASTY Right 05/18/2022   Procedure: TOTAL HIP ARTHROPLASTY ANTERIOR APPROACH;  Surgeon: Renette Butters, MD;  Location: [facility];  Service: Orthopedics;  Laterality: Right;   TRANSTHORACIC ECHOCARDIOGRAM  04/17/2014   EF 60-65%/  trivial AR, MR, and TR   UPPER GASTROINTESTINAL ENDOSCOPY     VAGINAL HYSTERECTOMY  1984   WRIST GANGLION EXCISION Right 02/05/2004   Patient Active Problem List   Diagnosis Date Noted   S/P total right hip arthroplasty 05/18/2022   S/P right hemicolectomy 06/12/2021   Aortic atherosclerosis ([place]) 03/31/2021   COPD (chronic obstructive pulmonary disease) ([place]) 11/24/2020   History of total hysterectomy 07/17/2019   Hypothyroidism 07/17/2019   Vertigo 11/06/2016   Dyspnea 11/02/2014   Upper airway cough syndrome 11/02/2014   Gout 06/28/2014   Vitamin B12  deficiency 06/28/2014   Multiple pulmonary nodules 06/28/2014   Essential hypertension 05/17/2014   Obesity (BMI 30-39.9) 01/14/2014   Lateral epicondylitis (tennis elbow) 01/02/2014   GERD (gastroesophageal reflux disease) 03/26/2013   LEG CRAMPS 05/16/2009   RESTLESS LEG SYNDROME, SEVERE 03/14/2009   Chronic interstitial cystitis 01/20/2009   Depression, major, recurrent ([place]) 03/19/2008   TUBULOVILLOUS ADENOMA, COLON 02/17/2008   Hyperlipidemia 02/17/2008   NEPHROLITHIASIS 02/17/2008   Former smoker 12/21/2007   MIGRAINE, CLASSICAL W/O INTRACTABLE MIGRAINE 06/09/2007   SYMPTOM, MEMORY LOSS 06/09/2007   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 Doesn't have MD follow-up for 1 year. Having colonoscopy next week. Wants to reduce PT 1x/week as we approach d/c, per MD recs. Still having trouble getting out of tub.   PERTINENT HISTORY: COPD, Fibromyalgia, Anxiety, depression, hx long covid, osteoporosis, HTN, HLD, hx lumbar fusion, spinal cord stimulator  PAIN:  Are you having pain? Yes: NPRS scale: 1/10 Pain location: R hip  Pain description: tight, sore, achey Aggravating factors: movement, walking Relieving factors: Meds  PRECAUTIONS: None  WEIGHT BEARING RESTRICTIONS No  FALLS:  Has patient fallen in last 6 months? Yes. Number of falls 4-5  LIVING ENVIRONMENT: Lives with: lives with their spouse Lives in: House/apartment Stairs: Yes: External: 2 steps; on right going up Has following equipment at  home: Single point cane, Walker - 2 wheeled, and shower chair  OCCUPATION: part time caregiver  PLOF: Independent  PATIENT GOALS Get better   OBJECTIVE: (objective measures from initial evaluation unless otherwise dated)  PATIENT SURVEYS:  FOTO 15% function  06/29/22: 29% function 08/06/22: 44%  COGNITION:  Overall cognitive status: Within functional limits for tasks assessed     SENSATION: WFL   POSTURE: rounded shoulders and forward head  PALPATION: Grossly TTP R hip/quads  LOWER EXTREMITY ROM: WFL for tasks assessed  Active ROM Right eval Left eval  Hip flexion    Hip extension    Hip abduction    Hip adduction    Hip internal rotation    Hip external rotation    Knee flexion    Knee extension    Ankle dorsiflexion  Ankle plantarflexion    Ankle inversion    Ankle eversion     (Blank rows = not tested)  LOWER EXTREMITY MMT:  MMT Right eval Left eval Right 05/31/22 Left  05/31/22 Right 06/29/22 Left 06/29/22 Right 08/06/22 Left  08/06/22  Hip flexion 3- * 4 3* 4+ 4- 5 4/5 4-  Hip extension   3* 4- 4-* 4+ 4+ 4+  Hip abduction   3+* 4+ 4-* 4+ 4+ 5/5  Hip adduction          Hip internal rotation          Hip external rotation          Knee flexion 4 * 5 4+ 5 5 5     Knee extension 4 * 5 4+ 5 5 5     Ankle dorsiflexion 4 5 5 5       Ankle plantarflexion          Ankle inversion          Ankle eversion           (Blank rows = not tested) *=pain   FUNCTIONAL TESTS:  5 times sit to stand: n/t 2 minute walk test: 200 feet with RW Stairs: able to complete 4 inch stairs with alternating pattern with UE support, greatest difficulty with R hip flexion Transfers: requires UE support, labored  GAIT: Distance walked: 200 feet Assistive device utilized: Walker - 2 wheeled Level of assistance: Modified independence Comments: 2MWT, with RW, antalgic, gait improves with time; able to ambulate without AD with antalgic gait without UE support and requires increased  time  Reassessment 05/31/22 5 times sit to stand: 11.4 seconds, labored without UE use.  2 minute walk test: 300 feet with RW Stairs: able to complete 4 inch stairs with alternating pattern with UE support, greatest difficulty with R hip flexion Transfers: able to complete without UE support, labored GAIT: Distance walked: 300 feet Assistive device utilized: Environmental consultant - 2 wheeled Level of assistance: Modified independence Comments: 2MWT, with RW, antalgic, gait improves with time  Reassessment 06/29/22 5 times sit to stand: 11.3 seconds, labored without UE use, uses LLE>RLE 2 minute walk test: 235 feet with RW Stairs: able to complete 7 inch stairs with alternating pattern with UE support, decreased RLE strength and motor control Transfers: able to complete without UE support, labored GAIT: Distance walked: 235 feet Assistive device utilized: SPC Level of assistance: Modified independence Comments: 2MWT, with SPC, antalgic, decreased R stance time  TODAY'S TREATMENT: 1020/23 Practiced tub transfers, kneeling and half-kneeling transitions, Mod I after practice 12" box sit<>stand 1UE support 2x10 6" step ups 2x10 1 HR 5# standing hip flexion 2x10  08/06/22 2MWT 282 with SPCft with min cueing initially for proper sequence (was pairing with Lt LE) Stairs 5RT reciprocal, 2RT carrying small box up stairs Squats STS from 12in step height with min A Step up 6in step height 15 with 1 HR MMT Supine: SLR  Heelslides to encourage no ER to bent knee up  08/02/22 Nustep level 4 seat 3 dynamic warm up  Heel/toe raises 2 x 10 no UE assist 4" box lateral step downs x 1' 4"  box forward step downs x 1' Sit to stand from 12" step 2 x 10  Supine: Piriformis right knee to left shoulder 10" x 10 LTR x 10 Hip IR/ER x 10  Seated piriformis stretch 10" x 10      PATIENT EDUCATION:  Education details:07/29/22: performing mobility exercises that are difficulty; 06/21/22  HEP; 05/31/22: Progress  made, HEP; 05/24/22 HEP;  Eval: Patient educated on exam findings, POC, scope of PT, HEP, and frequent mobility but not excessive activity. Person educated: Patient Education method: Explanation, Demonstration, and Handouts Education comprehension: verbalized understanding, returned demonstration, verbal cues required, and tactile cues required    HOME EXERCISE PROGRAM: Access Code: [email or web address] URL: [email or web address] Date: 08/02/2022 Prepared by: AP - Rehab  Exercises - Supine Ankle Pumps  - 3 x daily - 7 x weekly - 1 sets - 20 reps - Supine Gluteal Sets  - 3 x daily - 7 x weekly - 10 reps - 5-10 second hold - Bridge  - 3 x daily - 7 x weekly - 2 sets - 10 reps - Supine Heel Slide with Strap (Mirrored)  - 3 x daily - 7 x weekly - 2 sets - 10 reps - Sit to Stand  - 1 x daily - 7 x weekly - 3 sets - 10 reps - Sidelying Hip Abduction  - 1 x daily - 7 x weekly - 2-3 sets - 10 reps - Prone Hip Extension  - 1 x daily - 7 x weekly - 2-3 sets - 10 reps - Standing Tandem Balance with Counter Support  - 2 x daily - 7 x weekly - 1 sets - 3 reps - 30 sec hold - Lunge with Counter Support  - 2 x daily - 7 x weekly - 2 sets - 10 reps - Supine Lower Trunk Rotation  - 2 x daily - 7 x weekly - 1 sets - 10 reps - Supine Hip Internal and External Rotation  - 2 x daily - 7 x weekly - 1 sets - 10 reps - Seated Piriformis Stretch with Trunk Bend  - 2 x daily - 7 x weekly - 1 sets - 10 reps - Supine Piriformis Stretch with Leg Straight  - 2 x daily - 7 x weekly - 1 sets - 10 reps  Access Code: [email or web address] 08/06/22: SLR, marching, walking program  10/5- Lunge with Counter Support  - 2 x daily - 7 x weekly - 2 sets - 10 reps  07/21/22 tandem stance at counter  05/31/22 - Sidelying Hip Abduction  - 1 x daily - 7 x weekly - 2-3 sets - 10 reps - Prone Hip Extension  - 1 x daily - 7 x weekly - 2-3 sets - 10 reps  Date: 05/20/2022 - Supine Ankle Pumps  - 3 x daily - 7 x weekly - 1 sets - 20 reps -  Supine Gluteal Sets  - 3 x daily - 7 x weekly - 10 reps - 5-10 second hold - Bridge  - 3 x daily - 7 x weekly - 2 sets - 10 reps - Supine Heel Slide with Strap (Mirrored)  - 3 x daily - 7 x weekly - 2 sets - 10 reps  ASSESSMENT:  CLINICAL IMPRESSION: Continue with POC. Focused on tub transfer training stair training on 6" step and Rt hip strengthening. Good progress, tolerated exercises well. Patient will continue to benefit from skilled physical therapy services to further improve independence with functional mobility.  12:45 PM, 08/13/22 Candie Mile, PT, DPT Physical Therapist Acute Rehabilitation Services [facility] [place]    OBJECTIVE IMPAIRMENTS Abnormal gait, decreased activity tolerance, decreased balance, decreased endurance, decreased mobility, difficulty walking, decreased ROM, decreased strength, increased muscle spasms, impaired flexibility, improper body mechanics, and pain.  ACTIVITY LIMITATIONS carrying, lifting, bending, standing, squatting, sleeping, stairs, transfers, dressing, locomotion level, and caring for others  PARTICIPATION LIMITATIONS: meal prep, cleaning, laundry, shopping, community activity, and yard work  PERSONAL FACTORS Time since onset of injury/illness/exacerbation and 3+ comorbidities: COPD, Fibromyalgia, Anxiety, depression, hx long covid, osteoporosis, HTN, HLD, hx lumbar fusion, spinal cord stimulator  are also affecting patient's functional outcome.   REHAB POTENTIAL: Good  CLINICAL DECISION MAKING: Stable/uncomplicated  EVALUATION COMPLEXITY: Low   GOALS: Goals reviewed with patient? Yes  SHORT TERM GOALS: Target date: 06/10/2022  Patient will be independent with HEP in order to improve functional outcomes. Baseline:  Goal status: MET  2.  Patient will report at least 25% improvement in symptoms for improved quality of  life. Baseline:  Goal status: MET   LONG TERM GOALS: Target date: 07/01/2022  Patient will report at least 75% improvement in symptoms for improved quality of life. Baseline:  08/06/22:  Reports improvements by 80% Goal status: MET  2.  Patient will improve FOTO score by at least 37 points in order to indicate improved tolerance to activity. Baseline: 7/27 15% function 06/29/22: 29% function 08/06/22: 44% function Goal status: IN PROGRESS  3.  Patient will be able to navigate standard stairs with reciprocal pattern without compensation in order to demonstrate improved LE strength. Baseline: 05/20/22: 4 inch stairs with UE support, alternating, with compensations 06/29/22 able on 7 inch with compensations; 08/06/22: Able to complete reciprocal pattern with 1 HR assistance ascending, no HR needed descending; demonstrates increased difficulty carrying box to simulate carrying laundry basket upstairs. Goal status: IN PROGRESS  4.  Patient will be able to ambulate at least 400 feet in 2MWT in order to demonstrate improved tolerance to activity. Baseline: 200 with RW 05/31/22 300 feet with RW 06/29/22: 235 feet with SPC; 08/06/22: 258f with SPC, min cueing for sequence and able to demonstrate appropriate mechanics Goal status: IN PROGRESS  5.  Patient will demonstrate grade of 5/5 MMT grade in all tested musculature as evidence of improved strength to assist with stair ambulation and gait.   Baseline: see MMT Goal status: IN PROGRESS   PLAN: PT FREQUENCY: 1x/week  PT DURATION: 4 weeks  PLANNED INTERVENTIONS: Therapeutic exercises, Therapeutic activity, Neuromuscular re-education, Balance training, Gait training, Patient/Family education, Joint manipulation, Joint mobilization, Stair training, Orthotic/Fit training, DME instructions, Aquatic Therapy, Dry Needling, Electrical stimulation, Spinal manipulation, Spinal mobilization, Cryotherapy, Moist heat, Compression bandaging, scar mobilization,  Splintting, Taping, Traction, Ultrasound, Ionotophoresis 495mml Dexamethasone, and Manual therapy   PLAN FOR NEXT SESSION:  continue to progress Lt hip strength and stability.  hip hikes.  Next session include focus with sit to stand from low surface, squatting to get items out of low cabinets, assist sit to stand from ground to simulate bathtub and mobility to assist with donning socks/shoes.  Add lunges, SLS based activities, and hurdles/stairs to address hip flexion weakness.    LoCandie MilePT, DPT Physical Therapist [facility] [email or web address] 12:45 PM, 08/13/22

## 2022-08-16 ENCOUNTER — Encounter: Payer: Self-pay | Admitting: Internal Medicine

## 2022-08-23 ENCOUNTER — Encounter (HOSPITAL_COMMUNITY): Payer: Medicare Other | Admitting: Physical Therapy

## 2022-08-24 ENCOUNTER — Ambulatory Visit (AMBULATORY_SURGERY_CENTER): Payer: Medicare Other | Admitting: Internal Medicine

## 2022-08-24 ENCOUNTER — Encounter: Payer: Self-pay | Admitting: Internal Medicine

## 2022-08-24 VITALS — BP 111/57 | HR 60 | Temp 97.5°F | Resp 15 | Ht 62.0 in | Wt 145.0 lb

## 2022-08-24 DIAGNOSIS — K621 Rectal polyp: Secondary | ICD-10-CM | POA: Diagnosis not present

## 2022-08-24 DIAGNOSIS — D128 Benign neoplasm of rectum: Secondary | ICD-10-CM

## 2022-08-24 DIAGNOSIS — Z86010 Personal history of colonic polyps: Secondary | ICD-10-CM | POA: Diagnosis not present

## 2022-08-24 DIAGNOSIS — Z09 Encounter for follow-up examination after completed treatment for conditions other than malignant neoplasm: Secondary | ICD-10-CM

## 2022-08-24 DIAGNOSIS — I10 Essential (primary) hypertension: Secondary | ICD-10-CM | POA: Diagnosis not present

## 2022-08-24 DIAGNOSIS — Z85038 Personal history of other malignant neoplasm of large intestine: Secondary | ICD-10-CM

## 2022-08-24 MED ORDER — SODIUM CHLORIDE 0.9 % IV SOLN: 500.0000 mL | INTRAVENOUS | Status: DC

## 2022-08-24 NOTE — Progress Notes (Signed)
Pt resting comfortably. VSS. Airway intact. SBAR complete to RN. All questions answered.   

## 2022-08-24 NOTE — Progress Notes (Signed)
Pt's states no medical or surgical changes since previsit or office visit. 

## 2022-08-24 NOTE — Op Note (Signed)
[facility] Patient Name: Alison Brooks Procedure Date: 08/24/2022 2:17 PM MRN: [id number] Endoscopist: Docia Chuck. Henrene Pastor , MD, [id number] Age: 66 Referring MD:  Date of Birth: 03/09/1956 Gender: Female Account #: [id number] Procedure:                Colonoscopy with cold snare polypectomy x 1 Indications:              High risk colon cancer surveillance: Personal                            history of adenoma (10 mm or greater in size), High                            risk colon cancer surveillance: Personal history of                            multiple (3 or more) adenomas. Previous                            examinations 2007, 2014, 2019, June 2022. Most                            recent examination with large mass not amenable to                            endoscopic removal. Status post right hemicolectomy                            September 2022. Now for surveillance. Medicines:                Monitored Anesthesia Care Procedure:                Pre-Anesthesia Assessment:                           - Prior to the procedure, a History and Physical                            was performed, and patient medications and                            allergies were reviewed. The patient's tolerance of                            previous anesthesia was also reviewed. The risks                            and benefits of the procedure and the sedation                            options and risks were discussed with the patient.                            All questions were answered, and informed consent  was obtained. Prior Anticoagulants: The patient has                            taken no anticoagulant or antiplatelet agents. ASA                            Grade Assessment: II - A patient with mild systemic                            disease. After reviewing the risks and benefits,                            the patient was deemed in satisfactory condition  to                            undergo the procedure.                           After obtaining informed consent, the colonoscope                            was passed under direct vision. Throughout the                            procedure, the patient's blood pressure, pulse, and                            oxygen saturations were monitored continuously. The                            Olympus CF-HQ190L (Serial# [id number]) Colonoscope was                            introduced through the anus and advanced to the the                            ileocolonic anastomosis. The terminal ileum and the                            rectum were photographed. The quality of the bowel                            preparation was excellent. The colonoscopy was                            performed without difficulty. The patient tolerated                            the procedure well. The bowel preparation used was                            SUPREP via split dose instruction. Scope In: 2:28:52 PM Scope Out: 2:39:49 PM Scope Withdrawal Time: 0 hours 8 minutes 14 seconds  Total  Procedure Duration: 0 hours 10 minutes 57 seconds  Findings:                 A 2 mm polyp was found in the rectum. The polyp was                            removed with a cold snare. Resection and retrieval                            were complete.                           The exam was otherwise without abnormality on                            direct and retroflexion views.. Status post right                            hemicolectomy. Normal Neo ileum Complications:            No immediate complications. Estimated blood loss:                            None. Estimated Blood Loss:     Estimated blood loss: none. Impression:               - One 2 mm polyp in the rectum, removed with a cold                            snare. Resected and retrieved.                           - The examination was otherwise normal on direct                             and retroflexion views. Recommendation:           - Repeat colonoscopy in 3 years for surveillance.                           - Status post right hemicolectomy with unremarkable                            anastomosis and normal Neo ileum                           - Patient has a contact number available for                            emergencies. The signs and symptoms of potential                            delayed complications were discussed with the                            patient. Return to normal activities tomorrow.  Written discharge instructions were provided to the                            patient.                           - Resume previous diet.                           - Continue present medications.                           - Await pathology results. Docia Chuck. Henrene Pastor, MD 08/24/2022 2:46:08 PM This report has been signed electronically.

## 2022-08-24 NOTE — Patient Instructions (Addendum)
-   Repeat colonoscopy in 3 years for surveillance. - Resume previous diet. - Continue present medications.  Handout given for polyps.  YOU HAD AN ENDOSCOPIC PROCEDURE TODAY AT [facility]:   Refer to the procedure report that was given to you for any specific questions about what was found during the examination.  If the procedure report does not answer your questions, please call your gastroenterologist to clarify.  If you requested that your care partner not be given the details of your procedure findings, then the procedure report has been included in a sealed envelope for you to review at your convenience later.  YOU SHOULD EXPECT: Some feelings of bloating in the abdomen. Passage of more gas than usual.  Walking can help get rid of the air that was put into your GI tract during the procedure and reduce the bloating. If you had a lower endoscopy (such as a colonoscopy or flexible sigmoidoscopy) you may notice spotting of blood in your stool or on the toilet paper. If you underwent a bowel prep for your procedure, you may not have a normal bowel movement for a few days.  Please Note:  You might notice some irritation and congestion in your nose or some drainage.  This is from the oxygen used during your procedure.  There is no need for concern and it should clear up in a day or so.  SYMPTOMS TO REPORT IMMEDIATELY:  Following lower endoscopy (colonoscopy):  Excessive amounts of blood in the stool  Significant tenderness or worsening of abdominal pains  Swelling of the abdomen that is new, acute  Fever of 100F or higher  For urgent or emergent issues, a gastroenterologist can be reached at any hour by calling [phone]. Do not use MyChart messaging for urgent concerns.    DIET:  We do recommend a small meal at first, but then you may proceed to your regular diet.  Drink plenty of fluids but you should avoid alcoholic beverages for 24 hours.  ACTIVITY:  You should plan  to take it easy for the rest of today and you should NOT DRIVE or use heavy machinery until tomorrow (because of the sedation medicines used during the test).    FOLLOW UP: Our staff will call the number listed on your records the next business day following your procedure.  We will call around 7:15- 8:00 am to check on you and address any questions or concerns that you may have regarding the information given to you following your procedure. If we do not reach you, we will leave a message.     If any biopsies were taken you will be contacted by phone or by letter within the next 1-3 weeks.  Please call us at [phone] if you have not heard about the biopsies in 3 weeks.    SIGNATURES/CONFIDENTIALITY: You and/or your care partner have signed paperwork which will be entered into your electronic medical record.  These signatures attest to the fact that that the information above on your After Visit Summary has been reviewed and is understood.  Full responsibility of the confidentiality of this discharge information lies with you and/or your care-partner.

## 2022-08-24 NOTE — Progress Notes (Signed)
Called to room to assist during endoscopic procedure.  Patient ID and intended procedure confirmed with present staff. Received instructions for my participation in the procedure from the performing physician.  

## 2022-08-25 ENCOUNTER — Telehealth: Payer: Self-pay | Admitting: *Deleted

## 2022-08-25 NOTE — Telephone Encounter (Signed)
  Follow up Call-     08/24/2022    1:05 PM 04/07/2021    2:57 PM  Call back number  Post procedure Call Back phone  # [phone]  Permission to leave phone message Yes Yes     Patient questions:  Do you have a fever, pain , or abdominal swelling? No. Pain Score  0 *  Have you tolerated food without any problems? Yes.    Have you been able to return to your normal activities? Yes.    Do you have any questions about your discharge instructions: Diet   No. Medications  No. Follow up visit  No.  Do you have questions or concerns about your Care? No.  Actions: * If pain score is 4 or above: No action needed, pain <4.

## 2022-08-27 ENCOUNTER — Encounter: Payer: Self-pay | Admitting: Internal Medicine

## 2022-09-03 ENCOUNTER — Encounter (HOSPITAL_COMMUNITY): Payer: Medicare Other

## 2022-09-10 ENCOUNTER — Encounter (HOSPITAL_COMMUNITY): Payer: Medicare Other

## 2022-09-15 ENCOUNTER — Ambulatory Visit: Payer: Medicare Other | Admitting: Family Medicine

## 2022-09-15 ENCOUNTER — Encounter (HOSPITAL_COMMUNITY): Payer: Medicare Other

## 2022-09-20 ENCOUNTER — Encounter (HOSPITAL_COMMUNITY): Payer: Medicare Other

## 2022-09-22 ENCOUNTER — Ambulatory Visit (INDEPENDENT_AMBULATORY_CARE_PROVIDER_SITE_OTHER): Payer: Medicare Other | Admitting: Family Medicine

## 2022-09-22 ENCOUNTER — Encounter: Payer: Self-pay | Admitting: Family Medicine

## 2022-09-22 VITALS — BP 128/78 | HR 58 | Temp 97.4°F | Ht 62.0 in | Wt 157.8 lb

## 2022-09-22 DIAGNOSIS — R739 Hyperglycemia, unspecified: Secondary | ICD-10-CM | POA: Diagnosis not present

## 2022-09-22 DIAGNOSIS — I10 Essential (primary) hypertension: Secondary | ICD-10-CM

## 2022-09-22 DIAGNOSIS — E785 Hyperlipidemia, unspecified: Secondary | ICD-10-CM | POA: Diagnosis not present

## 2022-09-22 DIAGNOSIS — E538 Deficiency of other specified B group vitamins: Secondary | ICD-10-CM | POA: Diagnosis not present

## 2022-09-22 DIAGNOSIS — E039 Hypothyroidism, unspecified: Secondary | ICD-10-CM | POA: Diagnosis not present

## 2022-09-22 LAB — COMPREHENSIVE METABOLIC PANEL
ALT: 16 U/L (ref 0–35)
AST: 13 U/L (ref 0–37)
Albumin: 4.2 g/dL (ref 3.5–5.2)
Alkaline Phosphatase: 86 U/L (ref 39–117)
BUN: 12 mg/dL (ref 6–23)
CO2: 26 mEq/L (ref 19–32)
Calcium: 8.9 mg/dL (ref 8.4–10.5)
Chloride: 112 mEq/L (ref 96–112)
Creatinine, Ser: 0.73 mg/dL (ref 0.40–1.20)
GFR: 84.3 mL/min (ref >60.00)
Glucose, Bld: 92 mg/dL (ref 70–99)
Potassium: 4.9 mEq/L (ref 3.5–5.1)
Sodium: 144 mEq/L (ref 135–145)
Total Bilirubin: 0.3 mg/dL (ref 0.2–1.2)
Total Protein: 6.3 g/dL (ref 6.0–8.3)

## 2022-09-22 LAB — TSH: TSH: 2.1 u[IU]/mL (ref 0.35–5.50)

## 2022-09-22 LAB — HEMOGLOBIN A1C: Hgb A1c MFr Bld: 5.8 % (ref 4.6–6.5)

## 2022-09-22 LAB — VITAMIN B12: Vitamin B-12: 162 pg/mL — ABNORMAL LOW (ref 211–911)

## 2022-09-22 LAB — LDL CHOLESTEROL, DIRECT: Direct LDL: 145 mg/dL

## 2022-09-22 MED ORDER — ALBUTEROL SULFATE HFA 108 (90 BASE) MCG/ACT IN AERS: 2.0000 | INHALATION_SPRAY | Freq: Four times a day (QID) | RESPIRATORY_TRACT | 5 refills | Status: DC | PRN

## 2022-09-22 MED ORDER — SPIRIVA RESPIMAT 2.5 MCG/ACT IN AERS: INHALATION_SPRAY | RESPIRATORY_TRACT | 11 refills | Status: DC

## 2022-09-22 MED ORDER — CYANOCOBALAMIN 1000 MCG/ML IJ SOLN: INTRAMUSCULAR | 1 refills | Status: DC

## 2022-09-22 NOTE — Patient Instructions (Addendum)
Please stop by lab before you go If you have mychart- we will send your results within 3 business days of [place] receiving them.  If you do not have mychart- we will call you about results within 5 business days of [place] receiving them.  *please also note that you will see labs on mychart as soon as they post. I will later go in and write notes on them- will say "notes from Dr. Yong Channel"   Glad you restarted Spiriva- good move- refilled today  Recommended follow up: Return in about 6 months (around 03/23/2023) for physical or sooner if needed.Schedule b4 you leave.

## 2022-09-22 NOTE — Progress Notes (Signed)
Phone [phone] In person visit   Subjective:   Alison Brooks is a 66 y.o. 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 04/21/2007    Priority: High   Hyperglycemia 09/22/2022    Priority: Medium    Aortic atherosclerosis ([place]) 03/31/2021    Priority: Medium    Hypothyroidism 07/17/2019    Priority: Medium    Gout 06/28/2014    Priority: Medium    Vitamin B12 deficiency 06/28/2014    Priority: Medium    Multiple pulmonary nodules 06/28/2014    Priority: Medium    Essential hypertension 05/17/2014    Priority: Medium    RESTLESS LEG SYNDROME, SEVERE 03/14/2009    Priority: Medium    Chronic interstitial cystitis 01/20/2009    Priority: Medium    Depression, major, recurrent ([place]) 03/19/2008    Priority: Medium    Hyperlipidemia 02/17/2008    Priority: Medium    Former smoker 12/21/2007    Priority: Medium    FIBROMYALGIA 04/21/2007    Priority: Medium    Obesity (BMI 30-39.9) 01/14/2014    Priority: Low   Lateral epicondylitis (tennis elbow) 01/02/2014    Priority: Low   GERD (gastroesophageal reflux disease) 03/26/2013    Priority: Low   LEG CRAMPS 05/16/2009    Priority: Low   TUBULOVILLOUS ADENOMA, COLON 02/17/2008    Priority: Low   NEPHROLITHIASIS 02/17/2008    Priority: Low   MIGRAINE, CLASSICAL W/O INTRACTABLE MIGRAINE 06/09/2007    Priority: Low   SYMPTOM, MEMORY LOSS 06/09/2007    Priority: Low   S/P total right hip arthroplasty 05/18/2022   S/P right hemicolectomy 06/12/2021   History of total hysterectomy 07/17/2019   Vertigo 11/06/2016   Dyspnea 11/02/2014   Upper  airway cough syndrome 11/02/2014    Medications- reviewed and updated Current Outpatient Medications  Medication Sig Dispense Refill   acetaminophen (TYLENOL) 500 MG tablet Take 2 tablets (1,000 mg total) by mouth every 6 (six) hours as needed for moderate pain or mild pain. 60 tablet 0   amLODipine (NORVASC) 2.5 MG tablet TAKE 1 TABLET BY MOUTH  DAILY 90 tablet 3   baclofen (LIORESAL) 10 MG tablet Take 10 mg by mouth 3 (three) times daily.     diclofenac sodium (VOLTAREN) 1 % GEL Apply 4 g topically 4 (four) times daily. (Patient taking differently: Apply 4 g topically 4 (four) times daily as needed (pain).) 100 g 1   escitalopram (LEXAPRO) 20 MG tablet TAKE 1 TABLET BY MOUTH  DAILY 90 tablet 3   ezetimibe (ZETIA) 10 MG tablet TAKE 1 TABLET BY MOUTH  DAILY 90 tablet 3   famotidine (PEPCID) 20 MG tablet TAKE 1 TABLET BY MOUTH AT  BEDTIME 90 tablet 3   levothyroxine (SYNTHROID, LEVOTHROID) 50 MCG tablet Take 50 mcg by mouth daily before breakfast.     meloxicam (MOBIC) 15 MG tablet Take 1 tablet (15 mg total) by mouth daily as needed for pain (and inflammation). 30 tablet 0   oxybutynin (DITROPAN) 5 MG tablet TAKE 1 TABLET BY MOUTH 2  TIMES DAILY AS NEEDED FOR  BLADDER SPASMS. (Patient taking differently: TAKE 1 TABLET BY MOUTH 2  TIMES DAILY AS NEEDED FOR  BLADDER SPASMS.) 180 tablet 3   pantoprazole (PROTONIX) 40 MG tablet TAKE 1 TABLET BY MOUTH  TWICE DAILY 30 TO 60  MINUTES BEFORE MEALS 180 tablet 3   promethazine (PHENERGAN) 12.5 MG tablet Take 1 tablet (12.5 mg total) by mouth every 6 (six) hours as needed for nausea or vomiting. 30 tablet 0   rosuvastatin (CRESTOR) 40 MG tablet TAKE 1 TABLET BY MOUTH DAILY 100 tablet 2   topiramate (TOPAMAX) 200 MG tablet TAKE 1 TABLET BY MOUTH  DAILY 90 tablet 3   albuterol (VENTOLIN HFA) 108 (90 Base) MCG/ACT inhaler Inhale 2 puffs into the lungs every 6 (six) hours as needed. 18 g 5   cyanocobalamin (VITAMIN B12) 1000 MCG/ML injection 1000 mcg (1 mg)  injection once per per month or as directed 30 mL 1   Tiotropium Bromide Monohydrate (SPIRIVA RESPIMAT) 2.5 MCG/ACT AERS INHALE 2 PUFFS INTO THE LUNGS ONCE DAILY. 4 g 11   No current facility-administered medications for this visit.     Objective:  BP 128/78   Pulse (!) 58   Temp (!) 97.4 F (36.3 C) (Temporal)   Ht '5\' 2"'$  (1.575 m)   Wt 157 lb 12.8 oz (71.6 kg)   SpO2 98%   BMI 28.86 kg/m  Gen: NAD, resting comfortably CV: RRR no murmurs rubs or gallops Lungs: CTAB no crackles, wheeze, rhonchi Abdomen: soft/nontender/nondistended/normal bowel sounds. No rebound or guarding.  Ext: no edema Skin: warm, dry    Assessment and Plan   # Right hip replacement in july.  She reports graduated from PT but still having issues. Difficulty spreading legs has been challenging.   # COPD S: Noted on chest x-ray 2016.  Patient is used albuterol as needed for wheezing or shortness of breath.  In 2022 after COVID ongoing cough/shortness of breath and we opted to try Spiriva- has been helpful -some flare up in cold weather- restared spiriva with turn in weather A/P:overall stable other than with cold weather- refilled meds today- restart spiriva   #Hypertension S: Compliant with amlodipine 2.5 mg BP Readings from Last 3 Encounters:  09/22/22 128/78  08/24/22 (!) 111/57  05/19/22 121/61   A/P: Controlled. Continue current medications.    # Hyperlipidemia with aortic atherosclerosis S: compliant with rosuvastatin '40mg'$  -Also on Zetia 10 mg -thinks missing doses at last visit- more consistent now Lab Results  Component Value Date   CHOL 216 (H) 03/16/2022   HDL 62.20 03/16/2022   LDLCALC 137 (H) 03/16/2022   LDLDIRECT 170.0 07/17/2019   TRIG 87.0 03/16/2022   CHOLHDL 3 03/16/2022   A/P: hopefully improved- update LDL - continue current meds  #hypothyroidism-follows with endocrinology Dr. Denton Lank S: compliant On thyroid medication-levothyroxine 50 mcg     Lab Results  Component Value  Date   TSH 1.76 03/31/2021       A/P:hopefully stable- update tsh today. Continue current meds for now    # Hyperglycemia/insulin resistance/prediabetes-peak A1c 5.01 Mar 2022 S:  Medication: none -checking sugar some - low 100s typically Lab Results  Component Value Date   HGBA1C 5.8 03/16/2022   HGBA1C 5.7 (H) 06/02/2021   A/P: Hopefuly stable or improved- update a1c with labs today. Continue without meds for now.  # B12 deficiency S: Current treatment/medication (oral vs. IM): restarted monthly b12  Lab Results  Component Value Date   VITAMINB12 208 (L) 03/16/2022   A/P:  hopefully improved- update b12 today. Continue current meds for now    #Gout S: Compliant with allopurinol 100 mg. No gout flares recently- with uric acid under 6 on last check A/P: Controlled. Continue current medications.   #Depression S: compliant with lexapro '20mg'$ .  Feeling down some with hip issues    09/22/2022    9:18 AM 07/02/2022   10:39 AM 03/16/2022   12:37 PM  Depression screen PHQ 2/9  Decreased Interest 1 0 1  Down, Depressed, Hopeless 1 0 1  PHQ - 2 Score 2 0 2  Altered sleeping 1  1  Tired, decreased energy 1  1  Change in appetite 1  1  Feeling bad or failure about yourself  1  0  Trouble concentrating 3  1  Moving slowly or fidgety/restless 0  0  Suicidal thoughts 0  0  PHQ-9 Score 9  6  Difficult doing work/chores Not difficult at all  Somewhat difficult  A/P:  feels depression situationally worsened some- that stresses her- hoping improves as gets further out from surgery and hopefully sees improvement  #overactive bladder S: OAB using oxybutynin sparingly- helpful when used A/P: only as needed- working ok  Recommended follow up: Return in about 6 months (around 03/23/2023) for physical or sooner if needed.Schedule b4 you leave. Future Appointments  Date Time Provider [place]  07/08/2023 10:15 AM [facility]   Lab/Order associations:    ICD-10-CM   1. Hyperlipidemia, unspecified hyperlipidemia type  E78.5 LDL cholesterol, direct    2. Essential hypertension  I10 Comprehensive metabolic panel    3. Vitamin B12 deficiency  E53.8 Vitamin B12    cyanocobalamin (VITAMIN B12) 1000 MCG/ML injection    4. Hypothyroidism, unspecified type  E03.9 TSH    5. Hyperglycemia  R73.9 HgB A1c     Meds ordered this encounter  Medications   cyanocobalamin (VITAMIN B12) 1000 MCG/ML injection    Sig: 1000 mcg (1 mg) injection once per per month or as directed    Dispense:  30 mL    Refill:  1   albuterol (VENTOLIN HFA) 108 (90 Base) MCG/ACT inhaler    Sig: Inhale 2 puffs into the lungs every 6 (six) hours as needed.    Dispense:  18 g    Refill:  5   Tiotropium Bromide Monohydrate (SPIRIVA RESPIMAT) 2.5 MCG/ACT AERS    Sig: INHALE 2 PUFFS INTO THE LUNGS ONCE DAILY.    Dispense:  4 g    Refill:  11    Return precautions advised.  Garret Reddish, MD

## 2022-09-24 ENCOUNTER — Encounter (HOSPITAL_COMMUNITY): Payer: Medicare Other

## 2022-10-05 ENCOUNTER — Other Ambulatory Visit: Payer: Self-pay | Admitting: Family Medicine

## 2022-10-20 ENCOUNTER — Encounter: Payer: Self-pay | Admitting: Family

## 2022-10-20 ENCOUNTER — Ambulatory Visit (INDEPENDENT_AMBULATORY_CARE_PROVIDER_SITE_OTHER): Payer: Medicare Other | Admitting: Family

## 2022-10-20 VITALS — BP 124/69 | HR 59 | Temp 97.8°F | Ht 62.0 in | Wt 154.5 lb

## 2022-10-20 DIAGNOSIS — J441 Chronic obstructive pulmonary disease with (acute) exacerbation: Secondary | ICD-10-CM

## 2022-10-20 MED ORDER — GUAIFENESIN-CODEINE 100-10 MG/5ML PO SYRP: 5.0000 mL | ORAL_SOLUTION | Freq: Three times a day (TID) | ORAL | 0 refills | Status: DC | PRN

## 2022-10-20 MED ORDER — PREDNISONE 20 MG PO TABS: ORAL_TABLET | ORAL | 0 refills | Status: DC

## 2022-10-20 NOTE — Progress Notes (Signed)
Patient ID: Alison Brooks, female    DOB: Mar 09, 1956, 66 y.o.   MRN: [id number]  Chief Complaint  Patient presents with   Cough    Pt c/o Cough, Nasal/ chest congestion and SOB for about 3 weeks. Covid negative at home 5 days ago. Pt states she has not had a flu shot this season. Has tried nyquil/dayquil, robitussin and albuterol inhaler which does not help.    HPI:      Persistent cough:  Pt c/o Cough, Nasal/ chest congestion and SOB for about 3 weeks. Covid negative at home 5 days ago. Pt states she has not had a flu shot this season. Has tried nyquil/dayquil, robitussin and albuterol & Spiriva inhalers which have not helped.   Assessment & Plan:  1. COPD with acute exacerbation ([place]) - sending cough syrup and prednisone, advised on use & SE, increase water intake to 2L qd, continue OTC sinus meds, cough lozenges. Use Albuterol inhaler in am and qhs for next 5 days along with spiriva.  - predniSONE (DELTASONE) 20 MG tablet; START tomorrow- Take 2 pills in the morning with breakfast for 3 days, then 1 pill for 2 days  Dispense: 8 tablet; Refill: 0 - guaiFENesin-codeine (ROBITUSSIN AC) 100-10 MG/5ML syrup; Take 5 mLs by mouth 3 (three) times daily as needed for cough (Do NOT drive while taking).  Dispense: 120 mL; Refill: 0   Subjective:    Outpatient Medications Prior to Visit  Medication Sig Dispense Refill   acetaminophen (TYLENOL) 500 MG tablet Take 2 tablets (1,000 mg total) by mouth every 6 (six) hours as needed for moderate pain or mild pain. 60 tablet 0   albuterol (VENTOLIN HFA) 108 (90 Base) MCG/ACT inhaler Inhale 2 puffs into the lungs every 6 (six) hours as needed. 18 g 5   amLODipine (NORVASC) 2.5 MG tablet TAKE 1 TABLET BY MOUTH  DAILY 90 tablet 3   baclofen (LIORESAL) 10 MG tablet Take 10 mg by mouth 3 (three) times daily.     cyanocobalamin (VITAMIN B12) 1000 MCG/ML injection 1000 mcg (1 mg) injection once per per month or as directed 30 mL 1   diclofenac sodium  (VOLTAREN) 1 % GEL Apply 4 g topically 4 (four) times daily. (Patient taking differently: Apply 4 g topically 4 (four) times daily as needed (pain).) 100 g 1   escitalopram (LEXAPRO) 20 MG tablet TAKE 1 TABLET BY MOUTH DAILY 90 tablet 3   ezetimibe (ZETIA) 10 MG tablet TAKE 1 TABLET BY MOUTH  DAILY 90 tablet 3   famotidine (PEPCID) 20 MG tablet TAKE 1 TABLET BY MOUTH AT  BEDTIME 90 tablet 3   levothyroxine (SYNTHROID, LEVOTHROID) 50 MCG tablet Take 50 mcg by mouth daily before breakfast.     meloxicam (MOBIC) 15 MG tablet Take 1 tablet (15 mg total) by mouth daily as needed for pain (and inflammation). 30 tablet 0   oxybutynin (DITROPAN) 5 MG tablet TAKE 1 TABLET BY MOUTH 2  TIMES DAILY AS NEEDED FOR  BLADDER SPASMS. (Patient taking differently: TAKE 1 TABLET BY MOUTH 2  TIMES DAILY AS NEEDED FOR  BLADDER SPASMS.) 180 tablet 3   pantoprazole (PROTONIX) 40 MG tablet TAKE 1 TABLET BY MOUTH  TWICE DAILY 30 TO 60  MINUTES BEFORE MEALS 180 tablet 3   promethazine (PHENERGAN) 12.5 MG tablet Take 1 tablet (12.5 mg total) by mouth every 6 (six) hours as needed for nausea or vomiting. 30 tablet 0   rosuvastatin (CRESTOR) 40 MG tablet TAKE  1 TABLET BY MOUTH DAILY 100 tablet 2   Tiotropium Bromide Monohydrate (SPIRIVA RESPIMAT) 2.5 MCG/ACT AERS INHALE 2 PUFFS INTO THE LUNGS ONCE DAILY. 4 g 11   topiramate (TOPAMAX) 200 MG tablet TAKE 1 TABLET BY MOUTH  DAILY 90 tablet 3   No facility-administered medications prior to visit.   Past Medical History:  Diagnosis Date   Anxiety    Arthritis    back, neck   Chronic back pain    COPD (chronic obstructive pulmonary disease) ([place]) 2021   after Covid   Depression    Dyspnea    Since covid , NOW ON INHALERS   Fatty liver    Fibromyalgia    GERD (gastroesophageal reflux disease)    Heart murmur    Hematuria    History of adenomatous polyp of colon    History of COVID-19 10/2020   History of kidney stones    History of seizure    x1 2007 post op (per pt  neurologist work-up done ?mixture of anesthesia medication and prozac that pt had taken)  and has not any issues since    History of squamous cell carcinoma excision    Hyperlipidemia    Hypertension    Hypothyroidism    IC (interstitial cystitis)    Long COVID    Migraine    Osteoporosis    Pulmonary nodules    multiple per ct -- monitored by pcp   Right ureteral stone    RLS (restless legs syndrome)    Past Surgical History:  Procedure Laterality Date   CARDIOVASCULAR STRESS TEST  09/02/2004   normal perfusion study/  normal LV function and wall motion, ef 64%   CARPAL TUNNEL RELEASE Right 1998   CERVICAL FUSION  1992   CHOLECYSTECTOMY  1995   COLONOSCOPY     CYSTO/  BOTOX INJECTION  12-27-2008  &  04-01-2006   CYSTO/  HYDRODISTENTION/  INSTILLATION THERAPY  03/04/2006   CYSTOSCOPY W/ URETERAL STENT PLACEMENT Right 04/02/2016   Procedure: CYSTOSCOPY WITH RIGHT RETROGRADE, URETEROSCOPY PYELOGRAM LASER [place];  Surgeon: Kathie Rhodes, MD;  Location: [facility];  Service: Urology;  Laterality: Right;   HEMORRHOID SURGERY  01/03/2004   HOLMIUM LASER APPLICATION Right 12/45/8099   Procedure: HOLMIUM LASER APPLICATION;  Surgeon: Kathie Rhodes, MD;  Location[facility];  Service: Urology;  Laterality: Right;   LAPAROSCOPIC RIGHT HEMI COLECTOMY N/A 06/12/2021   Procedure: LAPAROSCOPIC RIGHT HEMI COLECTOMY;  Surgeon: Ileana Roup, MD;  Location: [facility];  Service: General;  Laterality: N/A;   LUMBAR FUSION  1996   L4 --S1   ROTATOR CUFF REPAIR Left 2004   SHOULDER ARTHROSCOPY WITH BICEPS TENDON REPAIR Left 12/2015   SPINAL CORD STIMULATOR IMPLANT  x4  last one 2013   STONE EXTRACTION WITH BASKET Right 04/02/2016   Procedure: STONE EXTRACTION WITH BASKET;  Surgeon: Kathie Rhodes, MD;  Location[facility];  Service: Urology;  Laterality: Right;   TONSILLECTOMY  1964   TOTAL HIP ARTHROPLASTY Right  05/18/2022   Procedure: TOTAL HIP ARTHROPLASTY ANTERIOR APPROACH;  Surgeon: Renette Butters, MD;  Location: [facility];  Service: Orthopedics;  Laterality: Right;   TRANSTHORACIC ECHOCARDIOGRAM  04/17/2014   EF 60-65%/  trivial AR, MR, and TR   UPPER GASTROINTESTINAL ENDOSCOPY     VAGINAL HYSTERECTOMY  1984   WRIST GANGLION EXCISION Right 02/05/2004   Allergies  Allergen Reactions   Latex Other (See Comments)    blisters  Zofran [Ondansetron Hcl] Nausea And Vomiting and Other (See Comments)    headache      Objective:    Physical Exam Vitals and nursing note reviewed.  Constitutional:      Appearance: Normal appearance.  Cardiovascular:     Rate and Rhythm: Normal rate and regular rhythm.  Pulmonary:     Effort: Pulmonary effort is normal.     Breath sounds: Examination of the right-upper field reveals rhonchi. Examination of the left-upper field reveals rhonchi. Examination of the right-middle field reveals rhonchi. Examination of the left-middle field reveals rhonchi. Rhonchi present.  Musculoskeletal:        General: Normal range of motion.  Skin:    General: Skin is warm and dry.  Neurological:     Mental Status: She is alert.  Psychiatric:        Mood and Affect: Mood normal.        Behavior: Behavior normal.    BP 124/69 (BP Location: Left Arm, Patient Position: Sitting, Cuff Size: Large)   Pulse (!) 59   Temp 97.8 F (36.6 C) (Temporal)   Ht '5\' 2"'$  (1.575 m)   Wt 154 lb 8 oz (70.1 kg)   SpO2 98%   BMI 28.26 kg/m  Wt Readings from Last 3 Encounters:  10/20/22 154 lb 8 oz (70.1 kg)  09/22/22 157 lb 12.8 oz (71.6 kg)  08/24/22 145 lb (65.8 kg)       Jeanie Sewer, NP

## 2022-11-08 ENCOUNTER — Other Ambulatory Visit: Payer: Self-pay | Admitting: Internal Medicine

## 2022-11-08 DIAGNOSIS — K219 Gastro-esophageal reflux disease without esophagitis: Secondary | ICD-10-CM

## 2022-12-19 ENCOUNTER — Other Ambulatory Visit: Payer: Self-pay | Admitting: Family Medicine

## 2023-01-20 ENCOUNTER — Other Ambulatory Visit: Payer: Self-pay | Admitting: Acute Care

## 2023-01-20 DIAGNOSIS — Z87891 Personal history of nicotine dependence: Secondary | ICD-10-CM

## 2023-01-20 DIAGNOSIS — Z122 Encounter for screening for malignant neoplasm of respiratory organs: Secondary | ICD-10-CM

## 2023-01-30 ENCOUNTER — Other Ambulatory Visit: Payer: Self-pay | Admitting: Internal Medicine

## 2023-03-17 ENCOUNTER — Ambulatory Visit (HOSPITAL_COMMUNITY)
Admission: RE | Admit: 2023-03-17 | Discharge: 2023-03-17 | Disposition: A | Discharge status: Home / Self Care | Payer: Medicare Other | Source: Ambulatory Visit | Attending: Acute Care | Admitting: Acute Care

## 2023-03-17 DIAGNOSIS — Z122 Encounter for screening for malignant neoplasm of respiratory organs: Secondary | ICD-10-CM | POA: Diagnosis not present

## 2023-03-17 DIAGNOSIS — Z87891 Personal history of nicotine dependence: Secondary | ICD-10-CM | POA: Insufficient documentation

## 2023-03-22 ENCOUNTER — Telehealth: Payer: Self-pay | Admitting: *Deleted

## 2023-03-22 ENCOUNTER — Other Ambulatory Visit: Payer: Self-pay | Admitting: Acute Care

## 2023-03-22 DIAGNOSIS — Z122 Encounter for screening for malignant neoplasm of respiratory organs: Secondary | ICD-10-CM

## 2023-03-22 DIAGNOSIS — Z87891 Personal history of nicotine dependence: Secondary | ICD-10-CM

## 2023-03-22 NOTE — Telephone Encounter (Signed)
PT calling for CT results. Please call @ [phone].

## 2023-03-28 ENCOUNTER — Ambulatory Visit (INDEPENDENT_AMBULATORY_CARE_PROVIDER_SITE_OTHER): Payer: Medicare Other | Admitting: Family Medicine

## 2023-03-28 ENCOUNTER — Encounter: Payer: Self-pay | Admitting: Family Medicine

## 2023-03-28 VITALS — BP 128/70 | HR 59 | Temp 97.0°F | Ht 62.0 in | Wt 159.2 lb

## 2023-03-28 DIAGNOSIS — R531 Weakness: Secondary | ICD-10-CM

## 2023-03-28 DIAGNOSIS — E785 Hyperlipidemia, unspecified: Secondary | ICD-10-CM | POA: Diagnosis not present

## 2023-03-28 DIAGNOSIS — E538 Deficiency of other specified B group vitamins: Secondary | ICD-10-CM

## 2023-03-28 DIAGNOSIS — R739 Hyperglycemia, unspecified: Secondary | ICD-10-CM | POA: Diagnosis not present

## 2023-03-28 DIAGNOSIS — N3281 Overactive bladder: Secondary | ICD-10-CM | POA: Diagnosis not present

## 2023-03-28 DIAGNOSIS — E039 Hypothyroidism, unspecified: Secondary | ICD-10-CM | POA: Diagnosis not present

## 2023-03-28 DIAGNOSIS — I10 Essential (primary) hypertension: Secondary | ICD-10-CM

## 2023-03-28 DIAGNOSIS — I700 Atherosclerosis of aorta: Secondary | ICD-10-CM

## 2023-03-28 DIAGNOSIS — Z780 Asymptomatic menopausal state: Secondary | ICD-10-CM

## 2023-03-28 DIAGNOSIS — F3341 Major depressive disorder, recurrent, in partial remission: Secondary | ICD-10-CM

## 2023-03-28 DIAGNOSIS — J449 Chronic obstructive pulmonary disease, unspecified: Secondary | ICD-10-CM

## 2023-03-28 DIAGNOSIS — Z131 Encounter for screening for diabetes mellitus: Secondary | ICD-10-CM

## 2023-03-28 DIAGNOSIS — M10072 Idiopathic gout, left ankle and foot: Secondary | ICD-10-CM

## 2023-03-28 DIAGNOSIS — Z87891 Personal history of nicotine dependence: Secondary | ICD-10-CM | POA: Diagnosis not present

## 2023-03-28 DIAGNOSIS — Z0001 Encounter for general adult medical examination with abnormal findings: Secondary | ICD-10-CM

## 2023-03-28 DIAGNOSIS — Z0000 Encounter for general adult medical examination without abnormal findings: Secondary | ICD-10-CM

## 2023-03-28 DIAGNOSIS — R0683 Snoring: Secondary | ICD-10-CM

## 2023-03-28 DIAGNOSIS — M797 Fibromyalgia: Secondary | ICD-10-CM

## 2023-03-28 LAB — CBC WITH DIFFERENTIAL/PLATELET
Basophils Absolute: 0.1 10*3/uL (ref 0.0–0.1)
Basophils Relative: 0.8 % (ref 0.0–3.0)
Eosinophils Absolute: 0.1 10*3/uL (ref 0.0–0.7)
Eosinophils Relative: 1.3 % (ref 0.0–5.0)
HCT: 40.7 % (ref 36.0–46.0)
Hemoglobin: 13.5 g/dL (ref 12.0–15.0)
Lymphocytes Relative: 28.3 % (ref 12.0–46.0)
Lymphs Abs: 2.1 10*3/uL (ref 0.7–4.0)
MCHC: 33.2 g/dL (ref 30.0–36.0)
MCV: 93.2 fl (ref 78.0–100.0)
Monocytes Absolute: 0.5 10*3/uL (ref 0.1–1.0)
Monocytes Relative: 7 % (ref 3.0–12.0)
Neutro Abs: 4.6 10*3/uL (ref 1.4–7.7)
Neutrophils Relative %: 62.6 % (ref 43.0–77.0)
Platelets: 196 10*3/uL (ref 150.0–400.0)
RBC: 4.36 Mil/uL (ref 3.87–5.11)
RDW: 14.5 % (ref 11.5–15.5)
WBC: 7.3 10*3/uL (ref 4.0–10.5)

## 2023-03-28 LAB — COMPREHENSIVE METABOLIC PANEL
ALT: 44 U/L — ABNORMAL HIGH (ref 0–35)
AST: 28 U/L (ref 0–37)
Albumin: 4.2 g/dL (ref 3.5–5.2)
Alkaline Phosphatase: 89 U/L (ref 39–117)
BUN: 13 mg/dL (ref 6–23)
CO2: 23 mEq/L (ref 19–32)
Calcium: 8.6 mg/dL (ref 8.4–10.5)
Chloride: 112 mEq/L (ref 96–112)
Creatinine, Ser: 0.81 mg/dL (ref 0.40–1.20)
GFR: 74.15 mL/min (ref >60.00)
Glucose, Bld: 91 mg/dL (ref 70–99)
Potassium: 3.7 mEq/L (ref 3.5–5.1)
Sodium: 142 mEq/L (ref 135–145)
Total Bilirubin: 0.4 mg/dL (ref 0.2–1.2)
Total Protein: 6.3 g/dL (ref 6.0–8.3)

## 2023-03-28 LAB — LIPID PANEL
Cholesterol: 136 mg/dL (ref 0–200)
HDL: 49.8 mg/dL (ref >39.00)
LDL Cholesterol: 65 mg/dL (ref 0–99)
NonHDL: 86.03
Total CHOL/HDL Ratio: 3
Triglycerides: 105 mg/dL (ref 0.0–149.0)
VLDL: 21 mg/dL (ref 0.0–40.0)

## 2023-03-28 LAB — URIC ACID: Uric Acid, Serum: 3.5 mg/dL (ref 2.4–7.0)

## 2023-03-28 LAB — TSH: TSH: 1.14 u[IU]/mL (ref 0.35–5.50)

## 2023-03-28 LAB — VITAMIN B12: Vitamin B-12: 449 pg/mL (ref 211–911)

## 2023-03-28 LAB — HEMOGLOBIN A1C: Hgb A1c MFr Bld: 5.6 % (ref 4.6–6.5)

## 2023-03-28 NOTE — Progress Notes (Signed)
Phone [phone]   Subjective:  Patient presents today for their annual physical. Chief complaint-noted.   See problem oriented charting- ROS- full  review of systems was completed and negative except for: imbalance leading to falls, also see phq9 discussion below, very sleepy- snoring does not wear CPAP, fatigue, cough, shortness of breath , imbalance issues, weakness, bruises easily, very sleepy, down at times, anhedonia at times.   The following were reviewed and entered/updated in epic: Past Medical History:  Diagnosis Date   Anxiety    Arthritis    back, neck   Chronic back pain    COPD (chronic obstructive pulmonary disease) (HCC) 2021   after Covid   Depression    Dyspnea    Since covid , NOW ON INHALERS   Fatty liver    Fibromyalgia    GERD (gastroesophageal reflux disease)    Heart murmur    Hematuria    History of adenomatous polyp of colon    History of COVID-19 10/2020   History of kidney stones    History of seizure    x1 2007 post op (per pt neurologist work-up done ?mixture of anesthesia medication and prozac that pt had taken)  and has not any issues since    History of squamous cell carcinoma excision    Hyperlipidemia    Hypertension    Hypothyroidism    IC (interstitial cystitis)    Long COVID    Migraine    Osteoporosis    Pulmonary nodules    multiple per ct -- monitored by pcp   Right ureteral stone    RLS (restless legs syndrome)    Patient Active Problem List   Diagnosis Date Noted   COPD (chronic obstructive pulmonary disease) (HCC) 11/24/2020    Priority: High   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 04/21/2007    Priority: High   Hyperglycemia 09/22/2022    Priority: Medium    Aortic atherosclerosis (HCC) 03/31/2021    Priority: Medium    Hypothyroidism 07/17/2019    Priority: Medium    Gout 06/28/2014    Priority: Medium    Vitamin B12 deficiency 06/28/2014    Priority: Medium    Multiple pulmonary  nodules 06/28/2014    Priority: Medium    Essential hypertension 05/17/2014    Priority: Medium    RESTLESS LEG SYNDROME, SEVERE 03/14/2009    Priority: Medium    Chronic interstitial cystitis 01/20/2009    Priority: Medium    Depression, major, recurrent (HCC) 03/19/2008    Priority: Medium    Hyperlipidemia 02/17/2008    Priority: Medium    Former smoker 12/21/2007    Priority: Medium    Fibromyalgia 04/21/2007    Priority: Medium    Obesity (BMI 30-39.9) 01/14/2014    Priority: Low   Lateral epicondylitis (tennis elbow) 01/02/2014    Priority: Low   GERD (gastroesophageal reflux disease) 03/26/2013    Priority: Low   LEG CRAMPS 05/16/2009    Priority: Low   TUBULOVILLOUS ADENOMA, COLON 02/17/2008    Priority: Low   NEPHROLITHIASIS 02/17/2008    Priority: Low   MIGRAINE, CLASSICAL W/O INTRACTABLE MIGRAINE 06/09/2007    Priority: Low   SYMPTOM, MEMORY LOSS 06/09/2007    Priority: Low   S/P total right hip arthroplasty 05/18/2022   S/P right hemicolectomy 06/12/2021   History of total hysterectomy 07/17/2019   Vertigo 11/06/2016   Dyspnea 11/02/2014   Upper airway cough syndrome 11/02/2014   Past Surgical  History:  Procedure Laterality Date   CARDIOVASCULAR STRESS TEST  09/02/2004   normal perfusion study/  normal LV function and wall motion, ef 64%   CARPAL TUNNEL RELEASE Right 1998   CERVICAL FUSION  1992   CHOLECYSTECTOMY  1995   COLONOSCOPY     CYSTO/  BOTOX INJECTION  12-27-2008  &  04-01-2006   CYSTO/  HYDRODISTENTION/  INSTILLATION THERAPY  03/04/2006   CYSTOSCOPY W/ URETERAL STENT PLACEMENT Right 04/02/2016   Procedure: CYSTOSCOPY WITH RIGHT RETROGRADE, URETEROSCOPY PYELOGRAM LASER LITHOTRIPSY,/URETERAL STENT PLACEMENT;  Surgeon: Ihor Gully, MD;  Location: [facility];  Service: Urology;  Laterality: Right;   HEMORRHOID SURGERY  01/03/2004   HOLMIUM LASER APPLICATION Right 04/02/2016   Procedure: HOLMIUM LASER APPLICATION;  Surgeon: Ihor Gully, MD;  Location: [facility];  Service: Urology;  Laterality: Right;   LAPAROSCOPIC RIGHT HEMI COLECTOMY N/A 06/12/2021   Procedure: LAPAROSCOPIC RIGHT HEMI COLECTOMY;  Surgeon: Andria Meuse, MD;  Location: [facility];  Service: General;  Laterality: N/A;   LUMBAR FUSION  1996   L4 --S1   ROTATOR CUFF REPAIR Left 2004   SHOULDER ARTHROSCOPY WITH BICEPS TENDON REPAIR Left 12/2015   SPINAL CORD STIMULATOR IMPLANT  x4  last one 2013   STONE EXTRACTION WITH BASKET Right 04/02/2016   Procedure: STONE EXTRACTION WITH BASKET;  Surgeon: Ihor Gully, MD;  Location: [facility];  Service: Urology;  Laterality: Right;   TONSILLECTOMY  1964   TOTAL HIP ARTHROPLASTY Right 05/18/2022   Procedure: TOTAL HIP ARTHROPLASTY ANTERIOR APPROACH;  Surgeon: Sheral Apley, MD;  Location: [facility];  Service: Orthopedics;  Laterality: Right;   TRANSTHORACIC ECHOCARDIOGRAM  04/17/2014   EF 60-65%/  trivial AR, MR, and TR   UPPER GASTROINTESTINAL ENDOSCOPY     VAGINAL HYSTERECTOMY  1984   WRIST GANGLION EXCISION Right 02/05/2004    Family History  Problem Relation Age of Onset   Heart attack Mother        Mom 68, Dad 3   Hypertension Mother    Heart disease Mother    Heart disease Father    Diabetes Father    Colon cancer Maternal Grandmother        not sure age of onset   Esophageal cancer Neg Hx    Rectal cancer Neg Hx    Stomach cancer Neg Hx     Medications- reviewed and updated Current Outpatient Medications  Medication Sig Dispense Refill   acetaminophen (TYLENOL) 500 MG tablet Take 2 tablets (1,000 mg total) by mouth every 6 (six) hours as needed for moderate pain or mild pain. 60 tablet 0   albuterol (VENTOLIN HFA) 108 (90 Base) MCG/ACT inhaler Inhale 2 puffs into the lungs every 6 (six) hours as needed. 18 g 5   amLODipine (NORVASC) 2.5 MG tablet TAKE 1 TABLET BY MOUTH  DAILY 90 tablet 3   baclofen (LIORESAL) 10 MG tablet Take 10 mg by mouth 3 (three)  times daily.     cyanocobalamin (VITAMIN B12) 1000 MCG/ML injection 1000 mcg (1 mg) injection once per per month or as directed 30 mL 1   diclofenac sodium (VOLTAREN) 1 % GEL Apply 4 g topically 4 (four) times daily. (Patient taking differently: Apply 4 g topically 4 (four) times daily as needed (pain).) 100 g 1   escitalopram (LEXAPRO) 20 MG tablet TAKE 1 TABLET BY MOUTH DAILY 90 tablet 3   ezetimibe (ZETIA) 10 MG tablet TAKE 1 TABLET BY MOUTH DAILY  100 tablet 2   famotidine (PEPCID) 20 MG tablet TAKE 1 TABLET BY MOUTH AT  BEDTIME 100 tablet 2   levothyroxine (SYNTHROID, LEVOTHROID) 50 MCG tablet Take 50 mcg by mouth daily before breakfast.     oxybutynin (DITROPAN) 5 MG tablet TAKE 1 TABLET BY MOUTH 2  TIMES DAILY AS NEEDED FOR  BLADDER SPASMS. (Patient taking differently: TAKE 1 TABLET BY MOUTH 2  TIMES DAILY AS NEEDED FOR  BLADDER SPASMS.) 180 tablet 3   pantoprazole (PROTONIX) 40 MG tablet TAKE 1 TABLET BY MOUTH TWICE  DAILY 30 TO 60 MINUTES BEFORE  MEALS 200 tablet 2   rosuvastatin (CRESTOR) 40 MG tablet TAKE 1 TABLET BY MOUTH DAILY 100 tablet 2   Tiotropium Bromide Monohydrate (SPIRIVA RESPIMAT) 2.5 MCG/ACT AERS INHALE 2 PUFFS INTO THE LUNGS ONCE DAILY. 4 g 11   topiramate (TOPAMAX) 200 MG tablet TAKE 1 TABLET BY MOUTH  DAILY 90 tablet 3   No current facility-administered medications for this visit.    Allergies-reviewed and updated Allergies  Allergen Reactions   Latex Other (See Comments)    blisters   Zofran [Ondansetron Hcl] Nausea And Vomiting and Other (See Comments)    headache    Social History   Social History Narrative   Lives with spouse, Hessie Diener.    Caffeine use: 1-2 cups coffee per day   Married 47 years in December 2020. 1 daughter. 2 grandkids (boy and girl). Lives 10 miles away.       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       Hobbies: previously enjoyed dancing, old car shoes (57 chevy), reading   Objective  Objective:  BP 128/70   Pulse (!) 59    Temp (!) 97 F (36.1 C)   Ht 5\' 2"  (1.575 m)   Wt 159 lb 3.2 oz (72.2 kg)   SpO2 99%   BMI 29.12 kg/m  Gen: NAD, resting comfortably HEENT: Mucous membranes are moist. Oropharynx normal Neck: no thyromegaly CV: RRR no murmurs rubs or gallops Lungs: CTAB no crackles, wheeze, rhonchi Abdomen: soft/nontender/nondistended/normal bowel sounds. No rebound or guarding.  Ext: no edema Skin: warm, dry Neuro: grossly normal except 4+/5 on left and 4-/5 on right , moves all extremities, PERRLA   Assessment and Plan   67 y.o. female presenting for annual physical.  Health Maintenance counseling: 1. Anticipatory guidance: Patient counseled regarding regular dental exams -q6 months, eye exams - yearly,  avoiding smoking and second hand smoke , limiting alcohol to 1 beverage per day- occasional wine , no illicit drugs .   2. Risk factor reduction:  Advised patient of need for regular exercise and diet rich and fruits and vegetables to reduce risk of heart attack and stroke.  Exercise- hip still bothers her plus balance issues- consider stationary bike .  Diet/weight management-weight up 5 lbs in last year- we discussed reversing this trend- feels needs to reduce portion sizes.  Wt Readings from Last 3 Encounters:  03/28/23 159 lb 3.2 oz (72.2 kg)  10/20/22 154 lb 8 oz (70.1 kg)  09/22/22 157 lb 12.8 oz (71.6 kg)  3. Immunizations/screenings/ancillary studies- shingrix at pharmacy planned ,  reports had COVID shot- team will check ncir Immunization History  Administered Date(s) Administered   Fluad Quad(high Dose 65+) 07/17/2019, 10/02/2021   Influenza Whole 08/11/1999, 08/11/2007, 07/25/2009   Influenza,inj,Quad PF,6+ Mos 09/03/2013, 07/12/2014, 08/04/2015   Influenza-Unspecified 07/25/2017   Moderna Sars-Covid-2 Vaccination 11/17/2019, 12/17/2019, 08/28/2020   PNEUMOCOCCAL CONJUGATE-20 10/02/2021  Pneumococcal Polysaccharide-23 07/17/2019   Td 10/25/1998, 10/25/2006   Tdap 04/20/2018  4.  Cervical cancer screening- past age based screening requirements. Previous hysterectomy for benign reasons as well and was told did not need further pap smears- sounds like had cervix removed  5. Breast cancer screening-  declines breast exam.  mammogram  at [facility]- 03/29/22 and scheduled next week  6. Colon cancer screening - 08/24/22 with 3 year repeat due to polyp history  7. Skin cancer screening- sees dermatology yearly.  advised regular sunscreen use. Denies worrisome, changing, or new skin lesions. Plans to schedule to be seen as has a spot changing that falls off and grows back on right chest  8. Birth control/STD check- only active with husband 9. Osteoporosis screening at 80- due for this- can schedule at check out desk  10. Smoking associated screening - former smoker quit 2009 and enrolled in lung cancer screening program  done 03/17/23 , urinalysis will be done today   Status of chronic or acute concerns   # Imbalance/leg weakness S: Patient complains of difficulty with balance lately(3-4 months) and has had more falls as a result-sprained ankle or knocking scabs off. No recent back pain. Not dizzy.  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 We will check B12 as well to see if that's contributing of the balance. Topamax itself could cause some issues but has worked well and hesitant to decrease. Offered neurology consult- she opts out . On exam legs about 4/5 strength bilaterally - stronger on left- weaker on right  maybe 4- with hip pain. Also considered CT (can't have MRI with spinal cord implant)  but wants to hold for now- she will reach out if changes heart rate mind about neurology consult for imabalnce/leg weakness  #Snoring/excessive sleepiness- has noted this for at least a year but we had not yet discussed. Husband has taken videos of her stopping breathing. Feels she could ocntinue to sleep and not feel rested -refer to pulmonology for further evalution   # COPD S: Noted on chest x-ray 2016.  Patient is used albuterol as needed for wheezing or shortness of breath.  In 2022 after COVID ongoing cough/shortness of breath and we opted to try Spiriva- generally helpful in the heat and cold- doesn't always need A/P:doing reasonably well- continue current medications    #Hypertension S: Compliant with amlodipine 2.5 mg A/P: stable- continue current medicines    # Hyperlipidemia with aortic atherosclerosis and moderate coronary artery calcifications on CT lung cancer screening S: compliant with rosuvastatin 40mg  -Also on Zetia 10 mg Lab Results  Component Value Date   CHOL 216 (H) 03/16/2022   HDL 62.20 03/16/2022   LDLCALC 137 (H) 03/16/2022   LDLDIRECT 145.0 09/22/2022   TRIG 87.0 03/16/2022   CHOLHDL 3 03/16/2022  A/P: hopefully improved- update lipid panel today. Continue current meds for now   #hypothyroidism-follows with endocrinology Dr. Kathreen Cosier S: compliant On thyroid medication-levothyroxine 50 mcg           A/P:hopefully stable-  update tsh today. Continue current meds for now     # Hyperglycemia/insulin resistance/prediabetes-peak A1c 5.01 Mar 2022 S:  Medication: none. Does need to work on diet/exercise Lab Results  Component Value Date   HGBA1C 5.8 09/22/2022   HGBA1C 5.8 03/16/2022   HGBA1C 5.7 (H) 06/02/2021   A/P: hopefully stable- update a1c today. Continue without meds for now    #Gout S: Compliant with allopurinol 100 mg. No gout flares recently- with uric acid under 6  on last check A/P: no flares without medications- update uric acid but unlikely to start medications unless flares  #Depression S: compliant with lexapro 20mg .     03/28/2023    1:05 PM 09/22/2022    9:18 AM 07/02/2022   10:39 AM  Depression screen PHQ 2/9  Decreased Interest 1 1 0  Down, Depressed, Hopeless 1 1 0  PHQ - 2 Score 2 2 0  Altered sleeping 3 1   Tired, decreased energy 3 1   Change in appetite 3 1   Feeling bad or failure about yourself  0 1   Trouble concentrating 3 3   Moving slowly or fidgety/restless 3 0   Suicidal thoughts 0 0   PHQ-9 Score 17 9   Difficult doing work/chores Somewhat difficult Not difficult at all   A/P:  PHQ9 of  17 but most of issues are around sleep she reports- wants to see pulmonary before considering medication(s) changes.   #vitamin b12 deficiency S: compliant with b12 injections monthly Lab Results  Component Value Date   VITAMINB12 162 (L) 09/22/2022  A/P: hopefully improved- update b12 today. Continue current meds for now  but if low may need every 2 weeks long term treatments  #overactive bladder S: OAB using oxybutynin sparingly- helpful when used- almost never using A/P: reasonable control with sparing use- has not used this around time of falls  #Gerd/history of esophageal stricture S: doing well on protonix 40mg  daily plus pepcid twice daily A/P: doing well- continue current medications   Recommended follow up: Return in about 6 months (around 09/27/2023) for followup or sooner if needed.Schedule b4 you leave. Future Appointments  Date Time Provider Department Center  07/08/2023 10:15 AM [facility] ANNUAL WELLNESS VISIT 1 [facility]   Lab/Order associations: fasting   ICD-10-CM   1. Preventative health care  Z00.00     2. Chronic obstructive pulmonary disease, unspecified COPD type (HCC)  J44.9     3. Hyperlipidemia, unspecified hyperlipidemia type  E78.5     4. Recurrent major depressive disorder, in partial  remission (HCC)  F33.41     5. Essential hypertension  I10 Comprehensive metabolic panel    CBC with Differential/Platelet    Lipid panel    6. Fibromyalgia  M79.7     7. Idiopathic gout of left foot, unspecified chronicity  M10.072 Uric acid    8. Vitamin B12 deficiency  E53.8 Vitamin B12    9. Aortic atherosclerosis (HCC)  I70.0     10. Hypothyroidism, unspecified type  E03.9 TSH    11. Hyperglycemia  R73.9 Hemoglobin A1c    12. Snoring  R06.83 Ambulatory referral to Pulmonology    13. Screening for diabetes mellitus  Z13.1 Hemoglobin A1c    14. Former smoker  Z87.891 Urinalysis, Routine w reflex microscopic    CANCELED: Urine Microscopic    15. Postmenopausal  Z78.0 DG Bone Density      No orders of the defined types were placed in this encounter.  Return precautions advised.  Tana Conch, MD

## 2023-03-28 NOTE — Patient Instructions (Addendum)
Let us know when you get your [facility] vaccine at the pharmacy.  Team says she got COVID shot at [facility] 4-5 months ago- please check ncir  she will reach out if changes her mind about neurology consult for imabalnce  We have placed a referral for you today to pulmonary. In some cases you will see # listed below- you can call this if you have not heard within a week. If you do not see # listed- you should receive a mychart message or phone call within a week with the # to call. Reach out to [place] if you ar enot scheduled within 2 weeks  Please stop by lab before you go If you have mychart- we will send your results within 3 business days of [place] receiving them.  If you do not have mychart- we will call you about results within 5 business days of [place] receiving them.  *please also note that you will see labs on mychart as soon as they post. I will later go in and write notes on them- will say "notes from Dr. Durene Cal"   Schedule your bone density test at check out desk.  - located [address] across the street from [place] - in the basement - you DO NEED an appointment for the bone density tests.   Recommended follow up: Return in about 6 months (around 09/27/2023) for followup or sooner if needed.Schedule b4 you leave.

## 2023-03-28 NOTE — Telephone Encounter (Signed)
Attempted to contact pt at home number but call could not be completed and unable to leave a voicemail. Called mobile number and left message for pt to call back to discuss lung screening results.

## 2023-03-29 ENCOUNTER — Encounter: Payer: Self-pay | Admitting: Internal Medicine

## 2023-03-29 ENCOUNTER — Ambulatory Visit (INDEPENDENT_AMBULATORY_CARE_PROVIDER_SITE_OTHER): Payer: Medicare Other | Admitting: Internal Medicine

## 2023-03-29 VITALS — BP 118/64 | HR 54 | Ht 62.0 in | Wt 160.2 lb

## 2023-03-29 DIAGNOSIS — R0683 Snoring: Secondary | ICD-10-CM | POA: Insufficient documentation

## 2023-03-29 DIAGNOSIS — R058 Other specified cough: Secondary | ICD-10-CM

## 2023-03-29 LAB — URINALYSIS, ROUTINE W REFLEX MICROSCOPIC
Bilirubin Urine: NEGATIVE
Ketones, ur: NEGATIVE
Nitrite: NEGATIVE
Specific Gravity, Urine: 1.025 (ref 1.000–1.030)
Total Protein, Urine: NEGATIVE
Urine Glucose: NEGATIVE
Urobilinogen, UA: 0.2 (ref 0.0–1.0)
pH: 6 (ref 5.0–8.0)

## 2023-03-29 NOTE — Assessment & Plan Note (Signed)
Mild occasional cough noted incidental to this visit.  She denies active problems currently.

## 2023-03-29 NOTE — Progress Notes (Signed)
03/29/23- 67yo F former smoker for sleep evaluation with concern of snoring, courtesy of Dr Durene Cal. Medical problem list includes Migraine, HTN, Aortic Atherosclerosis, Multiple Pulmonary Nodules, COPD, GERD, Hypothyroid, Chronic Cystitis, Hyperlipidemia, Restless Legs, Hemicolectomy,  -Ventolin hfa, Spiriva 2.5,  Epworth score-16 Body weight today-160 lbs Here with husband.  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  Sometimes wakes with a choke or cough.  Husband describes witnessed apneas.  She admits tired routinely and says she could "sleep 24 hours a day".  No sleep medicines.  3 cups of coffee. ENT surgery for tonsils and repair of nasal fracture.  Negative family history of sleep-related problems. Usual bedtime between midnight and 1 AM with a short sleep latency.  Waking 3-4 times during the night.  Some restless legs but no more complex parasomnias.  Frequent headaches. PFT 03/06/15- minimal obst, insignif resp to BD, Mild DLCO defect CT low dose screening program- IMPRESSION: 1. Lung-RADS 2, benign appearance or behavior. Continue annual screening with low-dose chest CT without contrast in 12 months. 2. Moderate coronary artery calcifications. 3. Aortic Atherosclerosis (ICD10-I70.0) and Emphysema (ICD10-J43.9).  Prior to Admission medications   Medication Sig Start Date End Date Taking? Authorizing Provider  acetaminophen (TYLENOL) 500 MG tablet Take 2 tablets (1,000 mg total) by mouth every 6 (six) hours as needed for moderate pain or mild pain. 05/19/22  Yes Gawne, Meghan M, PA-C  albuterol (VENTOLIN HFA) 108 (90 Base) MCG/ACT inhaler Inhale 2 puffs into the lungs every 6 (six) hours as needed. 09/22/22  Yes Shelva Majestic, MD  amLODipine (NORVASC) 2.5 MG tablet TAKE 1 TABLET BY MOUTH  DAILY 06/03/21  Yes Shelva Majestic, MD  baclofen (LIORESAL) 10 MG tablet Take 10 mg by mouth 3 (three) times daily.   Yes [provider]  cyanocobalamin (VITAMIN B12) 1000 MCG/ML  injection 1000 mcg (1 mg) injection once per per month or as directed 09/22/22  Yes Shelva Majestic, MD  diclofenac sodium (VOLTAREN) 1 % GEL Apply 4 g topically 4 (four) times daily. Patient taking differently: Apply 4 g topically 4 (four) times daily as needed (pain). 04/20/18  Yes Ardith Dark, MD  escitalopram (LEXAPRO) 20 MG tablet TAKE 1 TABLET BY MOUTH DAILY 10/06/22  Yes Shelva Majestic, MD  ezetimibe (ZETIA) 10 MG tablet TAKE 1 TABLET BY MOUTH DAILY 12/20/22  Yes Shelva Majestic, MD  famotidine (PEPCID) 20 MG tablet TAKE 1 TABLET BY MOUTH AT  BEDTIME 01/31/23  Yes Hilarie Fredrickson, MD  levothyroxine (SYNTHROID, LEVOTHROID) 50 MCG tablet Take 50 mcg by mouth daily before breakfast. 11/22/18  Yes [provider]  oxybutynin (DITROPAN) 5 MG tablet TAKE 1 TABLET BY MOUTH 2  TIMES DAILY AS NEEDED FOR  BLADDER SPASMS. Patient taking differently: TAKE 1 TABLET BY MOUTH 2  TIMES DAILY AS NEEDED FOR  BLADDER SPASMS. 10/11/17  Yes Shelva Majestic, MD  pantoprazole (PROTONIX) 40 MG tablet TAKE 1 TABLET BY MOUTH TWICE  DAILY 30 TO 60 MINUTES BEFORE  MEALS 11/09/22  Yes Hilarie Fredrickson, MD  rosuvastatin (CRESTOR) 40 MG tablet TAKE 1 TABLET BY MOUTH DAILY 08/06/22  Yes Shelva Majestic, MD  Tiotropium Bromide Monohydrate (SPIRIVA RESPIMAT) 2.5 MCG/ACT AERS INHALE 2 PUFFS INTO THE LUNGS ONCE DAILY. 09/22/22  Yes Shelva Majestic, MD  topiramate (TOPAMAX) 200 MG tablet TAKE 1 TABLET BY MOUTH  DAILY 01/12/22  Yes Shelva Majestic, MD  orlistat (XENICAL) 120 MG capsule Take 1 capsule (120 mg  total) by mouth 3 (three) times daily with meals. 05/30/15 02/29/16  Shelva Majestic, MD   Past Medical History:  Diagnosis Date   Anxiety    Arthritis    back, neck   Chronic back pain    COPD (chronic obstructive pulmonary disease) (HCC) 2021   after Covid   Depression    Dyspnea    Since covid , NOW ON INHALERS   Fatty liver    Fibromyalgia    GERD (gastroesophageal reflux disease)    Heart murmur     Hematuria    History of adenomatous polyp of colon    History of COVID-19 10/2020   History of kidney stones    History of seizure    x1 2007 post op (per pt neurologist work-up done ?mixture of anesthesia medication and prozac that pt had taken)  and has not any issues since    History of squamous cell carcinoma excision    Hyperlipidemia    Hypertension    Hypothyroidism    IC (interstitial cystitis)    Long COVID    Migraine    Osteoporosis    Pulmonary nodules    multiple per ct -- monitored by pcp   Right ureteral stone    RLS (restless legs syndrome)    Past Surgical History:  Procedure Laterality Date   CARDIOVASCULAR STRESS TEST  09/02/2004   normal perfusion study/  normal LV function and wall motion, ef 64%   CARPAL TUNNEL RELEASE Right 1998   CERVICAL FUSION  1992   CHOLECYSTECTOMY  1995   COLONOSCOPY     CYSTO/  BOTOX INJECTION  12-27-2008  &  04-01-2006   CYSTO/  HYDRODISTENTION/  INSTILLATION THERAPY  03/04/2006   CYSTOSCOPY W/ URETERAL STENT PLACEMENT Right 04/02/2016   Procedure: CYSTOSCOPY WITH RIGHT RETROGRADE, URETEROSCOPY PYELOGRAM LASER LITHOTRIPSY,/URETERAL STENT PLACEMENT;  Surgeon: Ihor Gully, MD;  Location: [facility];  Service: Urology;  Laterality: Right;   HEMORRHOID SURGERY  01/03/2004   HOLMIUM LASER APPLICATION Right 04/02/2016   Procedure: HOLMIUM LASER APPLICATION;  Surgeon: Ihor Gully, MD;  Location: [facility];  Service: Urology;  Laterality: Right;   LAPAROSCOPIC RIGHT HEMI COLECTOMY N/A 06/12/2021   Procedure: LAPAROSCOPIC RIGHT HEMI COLECTOMY;  Surgeon: Andria Meuse, MD;  Location: [facility];  Service: General;  Laterality: N/A;   LUMBAR FUSION  1996   L4 --S1   ROTATOR CUFF REPAIR Left 2004   SHOULDER ARTHROSCOPY WITH BICEPS TENDON REPAIR Left 12/2015   SPINAL CORD STIMULATOR IMPLANT  x4  last one 2013   STONE EXTRACTION WITH BASKET Right 04/02/2016   Procedure: STONE EXTRACTION WITH BASKET;   Surgeon: Ihor Gully, MD;  Location: [facility];  Service: Urology;  Laterality: Right;   TONSILLECTOMY  1964   TOTAL HIP ARTHROPLASTY Right 05/18/2022   Procedure: TOTAL HIP ARTHROPLASTY ANTERIOR APPROACH;  Surgeon: Sheral Apley, MD;  Location: [facility];  Service: Orthopedics;  Laterality: Right;   TRANSTHORACIC ECHOCARDIOGRAM  04/17/2014   EF 60-65%/  trivial AR, MR, and TR   UPPER GASTROINTESTINAL ENDOSCOPY     VAGINAL HYSTERECTOMY  1984   WRIST GANGLION EXCISION Right 02/05/2004   Family History  Problem Relation Age of Onset   Heart attack Mother        Mom 52, Dad 61   Hypertension Mother    Heart disease Mother    Heart disease Father    Diabetes Father    Colon cancer Maternal Grandmother  not sure age of onset   Esophageal cancer Neg Hx    Rectal cancer Neg Hx    Stomach cancer Neg Hx    Social History   Socioeconomic History   Marital status: Married    Spouse name: Hessie Diener   Number of children: 1   Years of education: College   Highest education level: Not on file  Occupational History   Occupation: Retired Engineer, civil (consulting)  Tobacco Use   Smoking status: Former    Packs/day: 1.25    Years: 40.00    Additional pack years: 0.00    Total pack years: 50.00    Types: Cigarettes    Quit date: 06/07/2008    Years since quitting: 14.8   Smokeless tobacco: Never  Vaping Use   Vaping Use: Never used  Substance and Sexual Activity   Alcohol use: Yes    Alcohol/week: 0.0 standard drinks of alcohol    Comment: VERY RARE   Drug use: No   Sexual activity: Not on file  Other Topics Concern   Not on file  Social History Narrative   Lives with spouse, Hessie Diener.    Caffeine use: 1-2 cups coffee per day   Married 47 years in December 2020. 1 daughter. 2 grandkids (boy and girl). Lives 10 miles away.       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       Hobbies: previously enjoyed dancing, old car shoes (23 chevy), reading   Social Determinants of Health    Financial Resource Strain: Low Risk  (07/02/2022)   Overall Financial Resource Strain (CARDIA)    Difficulty of Paying Living Expenses: Not hard at all  Food Insecurity: No Food Insecurity (07/02/2022)   Hunger Vital Sign    Worried About Running Out of Food in the Last Year: Never true    Ran Out of Food in the Last Year: Never true  Transportation Needs: No Transportation Needs (07/02/2022)   PRAPARE - Administrator, Civil Service (Medical): No    Lack of Transportation (Non-Medical): No  Physical Activity: Sufficiently Active (07/02/2022)   Exercise Vital Sign    Days of Exercise per Week: 3 days    Minutes of Exercise per Session: 50 min  Stress: No Stress Concern Present (07/02/2022)   [employer] of Occupational Health - Occupational Stress Questionnaire    Feeling of Stress : Not at all  Social Connections: Moderately Isolated (07/02/2022)   Social Connection and Isolation Panel [NHANES]    Frequency of Communication with Friends and Family: More than three times a week    Frequency of Social Gatherings with Friends and Family: More than three times a week    Attends Religious Services: Never    Database administrator or Organizations: No    Attends Banker Meetings: Never    Marital Status: Married  Catering manager Violence: Not At Risk (07/02/2022)   Humiliation, Afraid, Rape, and Kick questionnaire    Fear of Current or Ex-Partner: No    Emotionally Abused: No    Physically Abused: No    Sexually Abused: No   ROS-see HPI   + = positive Constitutional:    weight loss, night sweats, fevers, chills, fatigue, lassitude. HEENT:    headaches, difficulty swallowing, tooth/dental problems, sore throat,       sneezing, itching, ear ache, nasal congestion, post nasal drip, snoring CV:    chest pain, orthopnea, PND, swelling in lower extremities, anasarca,  dizziness, palpitations Resp:   shortness of breath with exertion or  at rest.                productive cough,   non-productive cough, coughing up of blood.              change in color of mucus.  wheezing.   Skin:    rash or lesions. GI:  No-   heartburn, indigestion, abdominal pain, nausea, vomiting, diarrhea,                 change in bowel habits, loss of appetite GU: dysuria, change in color of urine, no urgency or frequency.   flank pain. MS:   joint pain, stiffness, decreased range of motion, back pain. Neuro-     nothing unusual Psych:  change in mood or affect.  depression or anxiety.   memory loss.  OBJ- Physical Exam General- Alert, Oriented, Affect-appropriate, Distress- none acute Skin- rash-none, lesions- none, excoriation- none Lymphadenopathy- none Head- atraumatic            Eyes- Gross vision intact, PERRLA, conjunctivae and secretions clear            Ears- Hearing, canals-normal            Nose- Clear, no-Septal dev, mucus, polyps, erosion, perforation             Throat- Mallampati II-IV , mucosa clear , drainage- none, tonsils- atrophic, teeth Neck- flexible , trachea midline, no stridor , thyroid nl, carotid no bruit Chest - symmetrical excursion , unlabored           Heart/CV- RRR , no murmur , no gallop  , no rub, nl s1 s2                           - JVD- none , edema- none, stasis changes- none, varices- none           Lung- clear to P&A, wheeze- none, cough- none , dullness-none, rub- none           Chest wall-  Abd-  Br/ Gen/ Rectal- Not done, not indicated Extrem- cyanosis- none, clubbing, none, atrophy- none, strength- nl Neuro- grossly intact to observation

## 2023-03-29 NOTE — Patient Instructions (Signed)
Order- schedule home sleep test   dx snoring  Please call us about 2 weeks after your sleep test for results and recommendations 

## 2023-03-29 NOTE — Assessment & Plan Note (Signed)
Obstructive sleep apnea.  Appropriate discussion done, questions answered.  Reviewed safe driving, hygiene, testing, treatment options. Plan-schedule home sleep test

## 2023-03-30 ENCOUNTER — Telehealth: Payer: Self-pay | Admitting: Family Medicine

## 2023-03-30 ENCOUNTER — Other Ambulatory Visit: Payer: Medicare Other

## 2023-03-30 ENCOUNTER — Other Ambulatory Visit: Payer: Self-pay

## 2023-03-30 DIAGNOSIS — Z87891 Personal history of nicotine dependence: Secondary | ICD-10-CM

## 2023-03-30 DIAGNOSIS — R3129 Other microscopic hematuria: Secondary | ICD-10-CM | POA: Diagnosis not present

## 2023-03-30 NOTE — Telephone Encounter (Signed)
Uanble to reach pt . LVM requesting she call back to be scheduled for a lab visit .

## 2023-03-31 LAB — URINE CULTURE
MICRO NUMBER:: 15044621
SPECIMEN QUALITY:: ADEQUATE

## 2023-04-01 NOTE — Telephone Encounter (Signed)
Spoke with pt. She received mychart letter with results and had no further questions. Pt is aware we will contact her in a year to schedule next lung screening CT.

## 2023-04-04 DIAGNOSIS — Z1231 Encounter for screening mammogram for malignant neoplasm of breast: Secondary | ICD-10-CM | POA: Diagnosis not present

## 2023-04-04 LAB — HM MAMMOGRAPHY

## 2023-04-05 ENCOUNTER — Encounter: Payer: Self-pay | Admitting: Family Medicine

## 2023-04-11 DIAGNOSIS — G4730 Sleep apnea, unspecified: Secondary | ICD-10-CM | POA: Diagnosis not present

## 2023-04-11 DIAGNOSIS — M542 Cervicalgia: Secondary | ICD-10-CM | POA: Diagnosis not present

## 2023-04-20 ENCOUNTER — Other Ambulatory Visit: Payer: Self-pay

## 2023-04-20 DIAGNOSIS — R319 Hematuria, unspecified: Secondary | ICD-10-CM

## 2023-04-21 ENCOUNTER — Other Ambulatory Visit (INDEPENDENT_AMBULATORY_CARE_PROVIDER_SITE_OTHER): Payer: Medicare Other

## 2023-04-21 ENCOUNTER — Other Ambulatory Visit: Payer: Self-pay

## 2023-04-21 DIAGNOSIS — R319 Hematuria, unspecified: Secondary | ICD-10-CM

## 2023-04-21 LAB — URINALYSIS, ROUTINE W REFLEX MICROSCOPIC
Bilirubin Urine: NEGATIVE
Ketones, ur: NEGATIVE
Nitrite: NEGATIVE
Specific Gravity, Urine: >=1.03 — AB (ref 1.000–1.030)
Urine Glucose: NEGATIVE
Urobilinogen, UA: 0.2 (ref 0.0–1.0)
pH: 5.5 (ref 5.0–8.0)

## 2023-04-22 ENCOUNTER — Other Ambulatory Visit: Payer: Self-pay | Admitting: Family Medicine

## 2023-04-22 ENCOUNTER — Other Ambulatory Visit: Payer: Self-pay

## 2023-04-22 DIAGNOSIS — R319 Hematuria, unspecified: Secondary | ICD-10-CM

## 2023-05-04 ENCOUNTER — Encounter (INDEPENDENT_AMBULATORY_CARE_PROVIDER_SITE_OTHER): Payer: Medicare Other

## 2023-05-04 DIAGNOSIS — R0683 Snoring: Secondary | ICD-10-CM

## 2023-05-04 DIAGNOSIS — G4733 Obstructive sleep apnea (adult) (pediatric): Secondary | ICD-10-CM

## 2023-05-05 ENCOUNTER — Ambulatory Visit (INDEPENDENT_AMBULATORY_CARE_PROVIDER_SITE_OTHER)
Admission: RE | Admit: 2023-05-05 | Discharge: 2023-05-05 | Disposition: A | Discharge status: Home / Self Care | Payer: Medicare Other | Source: Ambulatory Visit | Attending: Family Medicine | Admitting: Family Medicine

## 2023-05-05 DIAGNOSIS — Z780 Asymptomatic menopausal state: Secondary | ICD-10-CM | POA: Diagnosis not present

## 2023-05-13 ENCOUNTER — Other Ambulatory Visit: Payer: Medicare Other

## 2023-05-17 ENCOUNTER — Ambulatory Visit (HOSPITAL_COMMUNITY): Payer: Medicare Other | Admitting: Physical Therapy

## 2023-05-30 ENCOUNTER — Encounter: Payer: Self-pay | Admitting: Family Medicine

## 2023-05-30 ENCOUNTER — Ambulatory Visit: Payer: Medicare Other | Admitting: Family Medicine

## 2023-05-30 VITALS — BP 110/74 | HR 61 | Temp 97.2°F | Ht 62.0 in | Wt 159.2 lb

## 2023-05-30 DIAGNOSIS — M810 Age-related osteoporosis without current pathological fracture: Secondary | ICD-10-CM | POA: Diagnosis not present

## 2023-05-30 MED ORDER — ALENDRONATE SODIUM 70 MG PO TABS: 70.0000 mg | ORAL_TABLET | ORAL | 3 refills | Status: DC

## 2023-05-30 NOTE — Progress Notes (Signed)
Phone [phone] In person visit   Subjective:   Alison Brooks is a 67 y.o. 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 04/21/2007    Priority: High   Age-related osteoporosis without current pathological fracture 05/30/2023    Priority: Medium    Hyperglycemia 09/22/2022    Priority: Medium    Aortic atherosclerosis (HCC) 03/31/2021    Priority: Medium    Hypothyroidism 07/17/2019    Priority: Medium    Gout 06/28/2014    Priority: Medium    Vitamin B12 deficiency 06/28/2014    Priority: Medium    Multiple pulmonary nodules 06/28/2014    Priority: Medium    Essential hypertension 05/17/2014    Priority: Medium    RESTLESS LEG SYNDROME, SEVERE 03/14/2009    Priority: Medium    Chronic interstitial cystitis 01/20/2009    Priority: Medium    Depression, major, recurrent (HCC) 03/19/2008    Priority: Medium    Hyperlipidemia 02/17/2008    Priority: Medium    Former smoker 12/21/2007    Priority: Medium    Fibromyalgia 04/21/2007    Priority: Medium    Obesity (BMI 30-39.9) 01/14/2014    Priority: Low   Lateral epicondylitis (tennis elbow) 01/02/2014    Priority: Low   GERD (gastroesophageal reflux disease) 03/26/2013    Priority: Low   LEG CRAMPS 05/16/2009    Priority: Low   TUBULOVILLOUS ADENOMA, COLON 02/17/2008    Priority: Low   NEPHROLITHIASIS 02/17/2008    Priority: Low   MIGRAINE, CLASSICAL W/O INTRACTABLE MIGRAINE 06/09/2007    Priority: Low   SYMPTOM, MEMORY LOSS 06/09/2007    Priority: Low   Snoring 03/29/2023   S/P total right hip arthroplasty 05/18/2022   S/P right hemicolectomy 06/12/2021    History of total hysterectomy 07/17/2019   Vertigo 11/06/2016   Dyspnea 11/02/2014   Upper airway cough syndrome 11/02/2014    Medications- reviewed and updated Current Outpatient Medications  Medication Sig Dispense Refill   acetaminophen (TYLENOL) 500 MG tablet Take 2 tablets (1,000 mg total) by mouth every 6 (six) hours as needed for moderate pain or mild pain. 60 tablet 0   albuterol (VENTOLIN HFA) 108 (90 Base) MCG/ACT inhaler Inhale 2 puffs into the lungs every 6 (six) hours as needed. 18 g 5   allopurinol (ZYLOPRIM) 100 MG tablet TAKE 1 TABLET BY MOUTH DAILY 100 tablet 2   amLODipine (NORVASC) 2.5 MG tablet TAKE 1 TABLET BY MOUTH  DAILY 90 tablet 3   baclofen (LIORESAL) 10 MG tablet Take 10 mg by mouth 3 (three) times daily.     Cholecalciferol (VITAMIN D) 50 MCG (2000 UT) tablet Take 2,000 Units by mouth daily.     cyanocobalamin (VITAMIN B12) 1000 MCG/ML injection 1000 mcg (1 mg) injection once per per month or as directed 30 mL 1   diclofenac sodium (VOLTAREN) 1 % GEL Apply 4 g topically 4 (four) times daily. (Patient taking differently: Apply 4 g topically 4 (four) times daily as needed (pain).) 100 g 1   escitalopram (LEXAPRO) 20 MG tablet TAKE 1 TABLET BY MOUTH DAILY 90 tablet 3   ezetimibe (  ZETIA) 10 MG tablet TAKE 1 TABLET BY MOUTH DAILY 100 tablet 2   famotidine (PEPCID) 20 MG tablet TAKE 1 TABLET BY MOUTH AT  BEDTIME 100 tablet 2   levothyroxine (SYNTHROID, LEVOTHROID) 50 MCG tablet Take 50 mcg by mouth daily before breakfast.     oxybutynin (DITROPAN) 5 MG tablet TAKE 1 TABLET BY MOUTH 2  TIMES DAILY AS NEEDED FOR  BLADDER SPASMS. (Patient taking differently: TAKE 1 TABLET BY MOUTH 2  TIMES DAILY AS NEEDED FOR  BLADDER SPASMS.) 180 tablet 3   pantoprazole (PROTONIX) 40 MG tablet TAKE 1 TABLET BY MOUTH TWICE  DAILY 30 TO 60 MINUTES BEFORE  MEALS 200 tablet 2   rosuvastatin (CRESTOR) 40 MG tablet TAKE 1 TABLET BY MOUTH DAILY 100 tablet 2   Tiotropium Bromide Monohydrate  (SPIRIVA RESPIMAT) 2.5 MCG/ACT AERS INHALE 2 PUFFS INTO THE LUNGS ONCE DAILY. 4 g 11   topiramate (TOPAMAX) 200 MG tablet TAKE 1 TABLET BY MOUTH  DAILY 90 tablet 3   No current facility-administered medications for this visit.     Objective:  BP 110/74   Pulse 61   Temp (!) 97.2 F (36.2 C)   Ht 5\' 2"  (1.575 m)   Wt 159 lb 3.2 oz (72.2 kg)   SpO2 97%   BMI 29.12 kg/m  Gen: NAD, resting comfortably CV: RRR no murmurs rubs or gallops Lungs: CTAB no crackles, wheeze, rhonchi Abdomen: soft/nontender/nondistended/normal bowel sounds. No rebound or guarding.  Ext: no edema     Assessment and Plan   # Osteoporosis S: Last DEXA: May 06, 2023 DEXA showed -3.7 at lumbar spine and -2.6 at left femoral neck -protonix likely contributes  -reports was told low bone density over 20 years a  Medication (bisphosphonate or prolia): none -fosamax increases GERD risks but no upcoming dental work  Calcium: 1200mg  (through diet ok) recommended - recommended Vitamin D: 1000 units a day recommended- takes 200 units  Last vitamin D- no known history of deficiency prior to osteoporosis diagnosis   - weight bearing exercise- does not do much- back limits her- plus doesn't live close to gym A/P: Osteoporosis poorly controlled.  Patient agrees to start Fosamax  From AVS:  " recommend taking  fosamax 70mg  once a week.  Administer first thing in the morning and >30 minutes before the first food, beverage (except plain water), or other medication of the day. Do not take with mineral water or with other beverages. Stay upright (not to lie down) for at least 30 minutes after taking medicine and until after first food of the day (to reduce irritation). Must be taken with 6 to 8 oz of plain water. The tablet should be swallowed whole; do not chew or suck.  I also recommend regular weight bearing exercise as able. Weight-bearing aerobic activities involve doing aerobic exercise on your feet, with your bones  supporting your weight. Examples include walking, dancing, low-impact aerobics, elliptical training machines, stair climbing and gardening. Also could try weighted workout at gym if your back tolerates "  #OSA diagnosed in 2024- considering CPAP but she needs to return to discuss with pulmonary-has upcoming appointment  # Need for repeat urinalysis-see prior result notes-patient wants to take Home as has difficulty urinating on demand in the morning or being well-hydrated  Recommended follow up: Return for next already scheduled visit or sooner if needed. Future Appointments  Date Time Provider Department Center  07/26/2023 10:15 AM [facility] ANNUAL WELLNESS VISIT 1 [facility]  07/29/2023 11:00  AM Jetty Duhamel D, MD [facility] None  09/27/2023  1:20 PM Shelva Majestic, MD [facility]    Lab/Order associations:   ICD-10-CM   1. Age-related osteoporosis without current pathological fracture  M81.0       Meds ordered this encounter  Medications   alendronate (FOSAMAX) 70 MG tablet    Sig: Take 1 tablet (70 mg total) by mouth every 7 (seven) days. Take with a full glass of water on an empty stomach.    Dispense:  13 tablet    Refill:  3    Return precautions advised.  Tana Conch, MD

## 2023-05-30 NOTE — Patient Instructions (Addendum)
Let us know if you get a flu shot at the pharmacy this fall.  Team please give calcium in diet handout- goal 1200mg  a day- can use combo of diet and calcium supplement  You are already doing great on vitamin D- goal at least 1000 units and you are at 2000 units  Team please give her urine collection set up for home  You have osteoporosis. Taking calcium and vitamin D as above are improtant  recommend taking  fosamax 70mg  once a week.  Administer first thing in the morning and >30 minutes before the first food, beverage (except plain water), or other medication of the day. Do not take with mineral water or with other beverages. Stay upright (not to lie down) for at least 30 minutes after taking medicine and until after first food of the day (to reduce irritation). Must be taken with 6 to 8 oz of plain water. The tablet should be swallowed whole; do not chew or suck.  I also recommend regular weight bearing exercise as able. Weight-bearing aerobic activities involve doing aerobic exercise on your feet, with your bones supporting your weight. Examples include walking, dancing, low-impact aerobics, elliptical training machines, stair climbing and gardening. Also could try weighted workout at gym if your back tolerates  Recommended follow up: Return for next already scheduled visit or sooner if needed.

## 2023-07-26 ENCOUNTER — Ambulatory Visit (INDEPENDENT_AMBULATORY_CARE_PROVIDER_SITE_OTHER): Payer: Medicare Other

## 2023-07-26 ENCOUNTER — Other Ambulatory Visit: Payer: Self-pay | Admitting: Internal Medicine

## 2023-07-26 VITALS — Wt 159.0 lb

## 2023-07-26 DIAGNOSIS — K219 Gastro-esophageal reflux disease without esophagitis: Secondary | ICD-10-CM

## 2023-07-26 DIAGNOSIS — Z0000 Encounter for general adult medical examination without abnormal findings: Secondary | ICD-10-CM

## 2023-07-26 NOTE — Progress Notes (Signed)
Subjective:   Alison Brooks is a 67 y.o. female who presents for Medicare Annual (Subsequent) preventive examination.  Visit Complete: Virtual  I connected with  Alison Brooks on 07/26/23 by a audio enabled telemedicine application and verified that I am speaking with the correct person using two identifiers.  Patient Location: Home  Provider Location: Office/Clinic  I discussed the limitations of evaluation and management by telemedicine. The patient expressed understanding and agreed to proceed.  Patient Medicare AWV questionnaire was completed by the patient on 07/22/23; I have confirmed that all information answered by patient is correct and no changes since this date.  Because this visit was a virtual/telehealth visit, some criteria may be missing or patient reported. Any vitals not documented were not able to be obtained and vitals that have been documented are patient reported.    Cardiac Risk Factors include: advanced age (>109men, >93 women);dyslipidemia;hypertension     Objective:    Today's Vitals   07/26/23 1021  Weight: 159 lb (72.1 kg)   Body mass index is 29.08 kg/m.     07/26/2023   10:27 AM 07/02/2022   10:40 AM 05/20/2022   11:27 AM 05/18/2022    2:00 PM 05/06/2022    9:23 AM 09/24/2021    2:14 PM 06/12/2021    5:14 PM  Advanced Directives  Does Patient Have a Medical Advance Directive? No No No No No Yes No  Type of Advance Directive      Living will   Would patient like information on creating a medical advance directive? No - Patient declined No - Patient declined No - Patient declined No - Patient declined No - Patient declined  No - Patient declined    Current Medications (verified) Outpatient Encounter Medications as of 07/26/2023  Medication Sig   acetaminophen (TYLENOL) 500 MG tablet Take 2 tablets (1,000 mg total) by mouth every 6 (six) hours as needed for moderate pain or mild pain.   albuterol (VENTOLIN HFA) 108 (90 Base) MCG/ACT inhaler  Inhale 2 puffs into the lungs every 6 (six) hours as needed.   alendronate (FOSAMAX) 70 MG tablet Take 1 tablet (70 mg total) by mouth every 7 (seven) days. Take with a full glass of water on an empty stomach.   allopurinol (ZYLOPRIM) 100 MG tablet TAKE 1 TABLET BY MOUTH DAILY   amLODipine (NORVASC) 2.5 MG tablet TAKE 1 TABLET BY MOUTH  DAILY   baclofen (LIORESAL) 10 MG tablet Take 10 mg by mouth 3 (three) times daily.   Cholecalciferol (VITAMIN D) 50 MCG (2000 UT) tablet Take 2,000 Units by mouth daily.   cyanocobalamin (VITAMIN B12) 1000 MCG/ML injection 1000 mcg (1 mg) injection once per per month or as directed   diclofenac sodium (VOLTAREN) 1 % GEL Apply 4 g topically 4 (four) times daily. (Patient taking differently: Apply 4 g topically 4 (four) times daily as needed (pain).)   escitalopram (LEXAPRO) 20 MG tablet TAKE 1 TABLET BY MOUTH DAILY   ezetimibe (ZETIA) 10 MG tablet TAKE 1 TABLET BY MOUTH DAILY   famotidine (PEPCID) 20 MG tablet TAKE 1 TABLET BY MOUTH AT  BEDTIME   levothyroxine (SYNTHROID, LEVOTHROID) 50 MCG tablet Take 50 mcg by mouth daily before breakfast.   oxybutynin (DITROPAN) 5 MG tablet TAKE 1 TABLET BY MOUTH 2  TIMES DAILY AS NEEDED FOR  BLADDER SPASMS. (Patient taking differently: TAKE 1 TABLET BY MOUTH 2  TIMES DAILY AS NEEDED FOR  BLADDER SPASMS.)   pantoprazole (PROTONIX) 40  MG tablet TAKE 1 TABLET BY MOUTH TWICE  DAILY 30 TO 60 MINUTES BEFORE  MEALS   rosuvastatin (CRESTOR) 40 MG tablet TAKE 1 TABLET BY MOUTH DAILY   Tiotropium Bromide Monohydrate (SPIRIVA RESPIMAT) 2.5 MCG/ACT AERS INHALE 2 PUFFS INTO THE LUNGS ONCE DAILY.   topiramate (TOPAMAX) 200 MG tablet TAKE 1 TABLET BY MOUTH  DAILY   [DISCONTINUED] orlistat (XENICAL) 120 MG capsule Take 1 capsule (120 mg total) by mouth 3 (three) times daily with meals.   No facility-administered encounter medications on file as of 07/26/2023.    Allergies (verified) Latex and Zofran [ondansetron hcl]   History: Past  Medical History:  Diagnosis Date   Anxiety    Arthritis    back, neck   Chronic back pain    COPD (chronic obstructive pulmonary disease) (HCC) 2021   after Covid   Depression    Dyspnea    Since covid , NOW ON INHALERS   Fatty liver    Fibromyalgia    GERD (gastroesophageal reflux disease)    Heart murmur    Hematuria    History of adenomatous polyp of colon    History of COVID-19 10/2020   History of kidney stones    History of seizure    x1 2007 post op (per pt neurologist work-up done ?mixture of anesthesia medication and prozac that pt had taken)  and has not any issues since    History of squamous cell carcinoma excision    Hyperlipidemia    Hypertension    Hypothyroidism    IC (interstitial cystitis)    Long COVID    Migraine    Osteoporosis    Pulmonary nodules    multiple per ct -- monitored by pcp   Right ureteral stone    RLS (restless legs syndrome)    Past Surgical History:  Procedure Laterality Date   CARDIOVASCULAR STRESS TEST  09/02/2004   normal perfusion study/  normal LV function and wall motion, ef 64%   CARPAL TUNNEL RELEASE Right 1998   CERVICAL FUSION  1992   CHOLECYSTECTOMY  1995   COLONOSCOPY     CYSTO/  BOTOX INJECTION  12-27-2008  &  04-01-2006   CYSTO/  HYDRODISTENTION/  INSTILLATION THERAPY  03/04/2006   CYSTOSCOPY W/ URETERAL STENT PLACEMENT Right 04/02/2016   Procedure: CYSTOSCOPY WITH RIGHT RETROGRADE, URETEROSCOPY PYELOGRAM LASER LITHOTRIPSY,/URETERAL STENT PLACEMENT;  Surgeon: Ihor Gully, MD;  Location: [facility];  Service: Urology;  Laterality: Right;   HEMORRHOID SURGERY  01/03/2004   HOLMIUM LASER APPLICATION Right 04/02/2016   Procedure: HOLMIUM LASER APPLICATION;  Surgeon: Ihor Gully, MD;  [facility];  Service: Urology;  Laterality: Right;   LAPAROSCOPIC RIGHT HEMI COLECTOMY N/A 06/12/2021   Procedure: LAPAROSCOPIC RIGHT HEMI COLECTOMY;  Surgeon: Andria Meuse, MD;  Location:  [facility];  Service: General;  Laterality: N/A;   LUMBAR FUSION  1996   L4 --S1   ROTATOR CUFF REPAIR Left 2004   SHOULDER ARTHROSCOPY WITH BICEPS TENDON REPAIR Left 12/2015   SPINAL CORD STIMULATOR IMPLANT  x4  last one 2013   STONE EXTRACTION WITH BASKET Right 04/02/2016   Procedure: STONE EXTRACTION WITH BASKET;  Surgeon: Ihor Gully, MD;  Location: [facility];  Service: Urology;  Laterality: Right;   TONSILLECTOMY  1964   TOTAL HIP ARTHROPLASTY Right 05/18/2022   Procedure: TOTAL HIP ARTHROPLASTY ANTERIOR APPROACH;  Surgeon: Sheral Apley, MD;  Location: [facility];  Service: Orthopedics;  Laterality: Right;  TRANSTHORACIC ECHOCARDIOGRAM  04/17/2014   EF 60-65%/  trivial AR, MR, and TR   UPPER GASTROINTESTINAL ENDOSCOPY     VAGINAL HYSTERECTOMY  1984   WRIST GANGLION EXCISION Right 02/05/2004   Family History  Problem Relation Age of Onset   Heart attack Mother        Mom 22, Dad 25   Hypertension Mother    Heart disease Mother    Heart disease Father    Diabetes Father    Colon cancer Maternal Grandmother        not sure age of onset   Esophageal cancer Neg Hx    Rectal cancer Neg Hx    Stomach cancer Neg Hx    Social History   Socioeconomic History   Marital status: Married    Spouse name: Hessie Diener   Number of children: 1   Years of education: College   Highest education level: Not on file  Occupational History   Occupation: Retired Engineer, civil (consulting)  Tobacco Use   Smoking status: Former    Current packs/day: 0.00    Average packs/day: 1.3 packs/day for 40.0 years (50.0 ttl pk-yrs)    Types: Cigarettes    Start date: 06/07/1968    Quit date: 06/07/2008    Years since quitting: 15.1   Smokeless tobacco: Never  Vaping Use   Vaping status: Never Used  Substance and Sexual Activity   Alcohol use: Yes    Alcohol/week: 0.0 standard drinks of alcohol    Comment: VERY RARE   Drug use: No   Sexual activity: Not on file  Other Topics Concern   Not on file  Social  History Narrative   Lives with spouse, Hessie Diener.    Caffeine use: 1-2 cups coffee per day   Married 47 years in December 2020. 1 daughter. 2 grandkids (boy and girl). Lives 10 miles away.       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       Hobbies: previously enjoyed dancing, old car shoes (6 chevy), reading   Social Determinants of Health   Financial Resource Strain: Low Risk  (07/22/2023)   Overall Financial Resource Strain (CARDIA)    Difficulty of Paying Living Expenses: Not hard at all  Food Insecurity: No Food Insecurity (07/22/2023)   Hunger Vital Sign    Worried About Running Out of Food in the Last Year: Never true    Ran Out of Food in the Last Year: Never true  Transportation Needs: No Transportation Needs (07/22/2023)   PRAPARE - Administrator, Civil Service (Medical): No    Lack of Transportation (Non-Medical): No  Physical Activity: Inactive (07/22/2023)   Exercise Vital Sign    Days of Exercise per Week: 0 days    Minutes of Exercise per Session: 10 min  Stress: No Stress Concern Present (07/22/2023)   [employer] of Occupational Health - Occupational Stress Questionnaire    Feeling of Stress : Only a little  Social Connections: Moderately Isolated (07/22/2023)   Social Connection and Isolation Panel [NHANES]    Frequency of Communication with Friends and Family: More than three times a week    Frequency of Social Gatherings with Friends and Family: Once a week    Attends Religious Services: Never    Database administrator or Organizations: No    Attends Banker Meetings: Never    Marital Status: Married    Tobacco Counseling Counseling given: Not Answered   Clinical Intake:  Consulting civil engineer  completed: Yes  Pain : No/denies pain     BMI - recorded: 29.08 Nutritional Status: BMI 25 -29 Overweight Nutritional Risks: None Diabetes: No  How often do you need to have someone help you when you read instructions,  pamphlets, or other written materials from your doctor or pharmacy?: 1 - Never  Interpreter Needed?: No  Information entered by :: Lanier Ensign, LPN   Activities of Daily Living    07/22/2023   10:02 AM  In your present state of health, do you have any difficulty performing the following activities:  Hearing? 0  Vision? 0  Difficulty concentrating or making decisions? 0  Walking or climbing stairs? 1  Comment uses a cane  Dressing or bathing? 0  Doing errands, shopping? 0  Preparing Food and eating ? N  Using the Toilet? N  In the past six months, have you accidently leaked urine? N  Do you have problems with loss of bowel control? Y  Comment wears a brief  Managing your Medications? N  Managing your Finances? N  Housekeeping or managing your Housekeeping? Y    Patient Care Team: Shelva Majestic, MD as PCP - General (Family Medicine) Dahlia Byes, [facility] (Inactive) as Pharmacist (Pharmacist)  Indicate any recent Medical Services you may have received from other than [facility] providers in the past year (date may be approximate).     Assessment:   This is a routine wellness examination for Kennie.  Hearing/Vision screen Hearing Screening - Comments:: Pt denies any hearing issues  Vision Screening - Comments:: Pt encouraged to follow up with eye provider    Goals Addressed             This Visit's Progress    Patient Stated       Lose weight and to be able to walk better        Depression Screen    07/26/2023   10:25 AM 05/30/2023   10:57 AM 03/28/2023    1:05 PM 09/22/2022    9:18 AM 07/02/2022   10:39 AM 03/16/2022   12:37 PM 10/02/2021    2:10 PM  PHQ 2/9 Scores  PHQ - 2 Score 0 1 2 2  0 2 0  PHQ- 9 Score 0 11 17 9  6 1   Exception Documentation   Patient refusal        Fall Risk    07/22/2023   10:02 AM 03/28/2023    1:01 PM 07/02/2022   10:40 AM 10/02/2021    1:18 PM 06/11/2021   10:21 AM  Fall Risk   Falls in the past year? 1 1 1 1  0  Number falls in  past yr: 1 0 1 1 0  Injury with Fall? 1 1 0 0 0  Risk for fall due to : Impaired vision;Impaired mobility;Impaired balance/gait;History of fall(s) History of fall(s) Impaired vision;Impaired balance/gait;Impaired mobility  Impaired vision  Risk for fall due to: Comment   releat ed to the hip surgery    Follow up Falls prevention discussed Falls evaluation completed Falls prevention discussed  Falls prevention discussed    MEDICARE RISK AT HOME: Medicare Risk at Home Any stairs in or around the home?: Yes If so, are there any without handrails?: No Home free of loose throw rugs in walkways, pet beds, electrical cords, etc?: Yes Adequate lighting in your home to reduce risk of falls?: Yes Life alert?: No Use of a cane, walker or w/c?: Yes Grab bars in the bathroom?: No Shower chair  or bench in shower?: No Elevated toilet seat or a handicapped toilet?: Yes  TIMED UP AND GO:  Was the test performed?  No    Cognitive Function:        07/26/2023   10:28 AM 07/02/2022   10:42 AM 06/11/2021   10:22 AM 06/05/2020   10:29 AM  6CIT Screen  What Year? 0 points 0 points 0 points 0 points  What month? 0 points 0 points 0 points 0 points  What time? 0 points 0 points 0 points   Count back from 20 0 points 0 points 0 points 0 points  Months in reverse 0 points 0 points 0 points 2 points  Repeat phrase 2 points 0 points 0 points 0 points  Total Score 2 points 0 points 0 points     Immunizations Immunization History  Administered Date(s) Administered   Fluad Quad(high Dose 65+) 07/17/2019, 10/02/2021   Influenza Whole 08/11/1999, 08/11/2007, 07/25/2009   Influenza,inj,Quad PF,6+ Mos 09/03/2013, 07/12/2014, 08/04/2015   Influenza-Unspecified 07/25/2017   Moderna Sars-Covid-2 Vaccination 11/17/2019, 12/17/2019, 08/28/2020   PNEUMOCOCCAL CONJUGATE-20 10/02/2021   Pneumococcal Polysaccharide-23 07/17/2019   Td 10/25/1998, 10/25/2006   Tdap 04/20/2018    TDAP status: Up to date  Flu  Vaccine status: Due, Education has been provided regarding the importance of this vaccine. Advised may receive this vaccine at local pharmacy or Health Dept. Aware to provide a copy of the vaccination record if obtained from local pharmacy or Health Dept. Verbalized acceptance and understanding.  Pneumococcal vaccine status: Up to date  Covid-19 vaccine status: Information provided on how to obtain vaccines.   Qualifies for Shingles Vaccine? Yes   Zostavax completed No   Shingrix Completed?: No.    Education has been provided regarding the importance of this vaccine. Patient has been advised to call insurance company to determine out of pocket expense if they have not yet received this vaccine. Advised may also receive vaccine at local pharmacy or Health Dept. Verbalized acceptance and understanding.  Screening Tests Health Maintenance  Topic Date Due   Zoster Vaccines- Shingrix (1 of 2) Never done   COVID-19 Vaccine (4 - 2023-24 season) 06/26/2023   INFLUENZA VACCINE  01/23/2024 (Originally 05/26/2023)   MAMMOGRAM  04/03/2024   Medicare Annual Wellness (AWV)  07/25/2024   Colonoscopy  08/24/2025   DTaP/Tdap/Td (4 - Td or Tdap) 04/20/2028   Pneumonia Vaccine 37+ Years old  Completed   DEXA SCAN  Completed   Hepatitis C Screening  Completed   HPV VACCINES  Aged Out   Lung Cancer Screening  Discontinued    Health Maintenance  Health Maintenance Due  Topic Date Due   Zoster Vaccines- Shingrix (1 of 2) Never done   COVID-19 Vaccine (4 - 2023-24 season) 06/26/2023    Colorectal cancer screening: Type of screening: Colonoscopy. Completed 08/24/22. Repeat every 3 years  Mammogram status: Completed 04/04/23. Repeat every year  Bone Density status: Completed 05/05/23. Results reflect: Bone density results: OSTEOPOROSIS. Repeat every 2 years.  Lung Cancer Screening: (Low Dose CT Chest recommended if Age 59-80 years, 20 pack-year currently smoking OR have quit w/in 15years.) does qualify.    Lung Cancer Screening Referral: placed order 03/22/23  Additional Screening:  Hepatitis C Screening:  Completed 08/04/15  Vision Screening: Recommended annual ophthalmology exams for early detection of glaucoma and other disorders of the eye. Is the patient up to date with their annual eye exam?  No  Who is the provider or what is the name of  the office in which the patient attends annual eye exams? Encouraged to follow up with provider  If pt is not established with a provider, would they like to be referred to a provider to establish care? No .   Dental Screening: Recommended annual dental exams for proper oral hygiene   Community Resource Referral / Chronic Care Management: CRR required this visit?  No   CCM required this visit?  No     Plan:     I have personally reviewed and noted the following in the patient's chart:   Medical and social history Use of alcohol, tobacco or illicit drugs  Current medications and supplements including opioid prescriptions. Patient is not currently taking opioid prescriptions. Functional ability and status Nutritional status Physical activity Advanced directives List of other physicians Hospitalizations, surgeries, and ER visits in previous 12 months Vitals Screenings to include cognitive, depression, and falls Referrals and appointments  In addition, I have reviewed and discussed with patient certain preventive protocols, quality metrics, and best practice recommendations. A written personalized care plan for preventive services as well as general preventive health recommendations were provided to patient.     Marzella Schlein, LPN   45/01/980   After Visit Summary: (MyChart) Due to this being a telephonic visit, the after visit summary with patients personalized plan was offered to patient via MyChart   Nurse Notes: none

## 2023-07-26 NOTE — Patient Instructions (Signed)
Ms. Douse , Thank you for taking time to come for your Medicare Wellness Visit. I appreciate your ongoing commitment to your health goals. Please review the following plan we discussed and let me know if I can assist you in the future.   Referrals/Orders/Follow-Ups/Clinician Recommendations: Aim for 30 minutes of exercise or brisk walking, 6-8 glasses of water, and 5 servings of fruits and vegetables each day. Eye exams encouraged to follow up  Each day, aim for 6 glasses of water, plenty of protein in your diet and try to get up and walk/ stretch every hour for 5-10 minutes at a time.     This is a list of the screening recommended for you and due dates:  Health Maintenance  Topic Date Due   Zoster (Shingles) Vaccine (1 of 2) Never done   COVID-19 Vaccine (4 - 2023-24 season) 06/26/2023   Flu Shot  01/23/2024*   Mammogram  04/03/2024   Medicare Annual Wellness Visit  07/25/2024   Colon Cancer Screening  08/24/2025   DTaP/Tdap/Td vaccine (4 - Td or Tdap) 04/20/2028   Pneumonia Vaccine  Completed   DEXA scan (bone density measurement)  Completed   Hepatitis C Screening  Completed   HPV Vaccine  Aged Out   Screening for Lung Cancer  Discontinued  *Topic was postponed. The date shown is not the original due date.    Advanced directives: (Declined) Advance directive discussed with you today. Even though you declined this today, please call our office should you change your mind, and we can give you the proper paperwork for you to fill out.  Next Medicare Annual Wellness Visit scheduled for next year: Yes

## 2023-07-29 ENCOUNTER — Ambulatory Visit: Payer: Medicare Other | Admitting: Internal Medicine

## 2023-08-18 ENCOUNTER — Other Ambulatory Visit: Payer: Self-pay | Admitting: Family Medicine

## 2023-08-26 ENCOUNTER — Encounter: Payer: Self-pay | Admitting: Nurse Practitioner

## 2023-08-26 ENCOUNTER — Ambulatory Visit: Payer: Medicare Other | Admitting: Nurse Practitioner

## 2023-08-26 VITALS — BP 130/70 | HR 56 | Ht 62.0 in | Wt 160.8 lb

## 2023-08-26 DIAGNOSIS — G4733 Obstructive sleep apnea (adult) (pediatric): Secondary | ICD-10-CM | POA: Insufficient documentation

## 2023-08-26 DIAGNOSIS — R918 Other nonspecific abnormal finding of lung field: Secondary | ICD-10-CM | POA: Diagnosis not present

## 2023-08-26 DIAGNOSIS — J449 Chronic obstructive pulmonary disease, unspecified: Secondary | ICD-10-CM | POA: Diagnosis not present

## 2023-08-26 NOTE — Patient Instructions (Addendum)
Start CPAP 5-15 cmH2O, nasal mask of choice and heated humidity, every night, minimum of 4-6 hours a night.  Change equipment as directed. Wash your tubing with warm soap and water daily, hang to dry. Wash humidifier portion weekly. Use bottled, distilled water and change daily Be aware of reduced alertness and do not drive or operate heavy machinery if experiencing this or drowsiness.  Exercise encouraged, as tolerated. Healthy weight management discussed.  Avoid or decrease alcohol consumption and medications that make you more sleepy, if possible. Notify if persistent daytime sleepiness occurs even with consistent use of PAP therapy.  We discussed how untreated sleep apnea puts an individual at risk for cardiac arrhthymias, pulm HTN, DM, stroke and increases their risk for daytime accidents. We also briefly reviewed treatment options including weight loss, side sleeping position, oral appliance, CPAP therapy  Follow up in 10-12 weeks with Dr. Maple Hudson or Florentina Addison Lorn Butcher,NP to see CPAP is going, or sooner, if needed

## 2023-08-26 NOTE — Progress Notes (Signed)
@Patient  ID: Alison Brooks, female    DOB: 1956/05/15, 67 y.o.   MRN: [id number]  Chief Complaint  Patient presents with   Follow-up    Pt is here for F/U visit to discuss Sleep Study Results.     Referring provider: Shelva Majestic, MD  HPI: 67 year old female, former smoker referred for sleep consult 03/2023 with Dr. Maple Hudson. Past medical history significant for migraines, HTN, aortic atherosclerosis, multiple pulmonary nodules, COPD, GERD, hypothyroid, chronic cystitis, HLD, RLS, hx of hemicolectomy. She is followed by the lung cancer screening program as well.   TEST/EVENTS:  04/11/2023 HST: AHI 7.7/h (4%), 22.8/h (3%), SpO2 low 88%  03/29/2023: OV with Dr. Maple Hudson for sleep consult.  Retired Engineer, civil (consulting).  Snores pretty routinely.  Sometimes wakes with a choke or cough.  Husband describes witnessed apneas.  Admits tired routinely and says she could sleep 24 hours a day.  No sleep medicines.  Drinks 3 cups of coffee.  Has had ENT surgery for tonsils and repair of nasal fracture in the past.  Usual bedtime between midnight and 1 AM with short sleep latency.  Waking 3-4 times a night.  Some restless leg but no complex parasomnias.  Frequent headaches.  Home sleep study ordered for further evaluation.  08/26/2023: Today - follow up Discussed the use of AI scribe software for clinical note transcription with the patient, who gave verbal consent to proceed.    The patient, with a history of snoring and chronic fatigue, was suspected to have sleep apnea. She reports feeling tired all the time, with her spouse noting episodes of apnea during sleep. The patient has undergone a home sleep study, which revealed mild sleep apnea. Despite the classification, the patient experiences significant daytime symptoms, including a persistent sense of tiredness and a propensity to sleep during the day. She reports that she could sleep for 24 hours a day, indicating a state of chronic fatigue. The patient also mentions  that she already sleeps on her side, a positional adjustment often recommended for mild sleep apnea, but this has not alleviated her symptoms. She denies drowsy driving, morning headaches, sleep parasomnias/paralysis. She does not take any sleep aids. Breathing is stable. No issues with her COPD. No significant cough or wheezing. Uses Spiriva and infrequent PRN SABA.       Allergies  Allergen Reactions   Latex Other (See Comments)    blisters   Zofran [Ondansetron Hcl] Nausea And Vomiting and Other (See Comments)    headache    Immunization History  Administered Date(s) Administered   Fluad Quad(high Dose 65+) 07/17/2019, 10/02/2021   Influenza Whole 08/11/1999, 08/11/2007, 07/25/2009   Influenza,inj,Quad PF,6+ Mos 09/03/2013, 07/12/2014, 08/04/2015   Influenza-Unspecified 07/25/2017   Moderna Sars-Covid-2 Vaccination 11/17/2019, 12/17/2019, 08/28/2020   PNEUMOCOCCAL CONJUGATE-20 10/02/2021   Pneumococcal Polysaccharide-23 07/17/2019   Td 10/25/1998, 10/25/2006   Tdap 04/20/2018    Past Medical History:  Diagnosis Date   Anxiety    Arthritis    back, neck   Chronic back pain    COPD (chronic obstructive pulmonary disease) (HCC) 2021   after Covid   Depression    Dyspnea    Since covid , NOW ON INHALERS   Fatty liver    Fibromyalgia    GERD (gastroesophageal reflux disease)    Heart murmur    Hematuria    History of adenomatous polyp of colon    History of COVID-19 10/2020   History of kidney stones    History  of seizure    x1 2007 post op (per pt neurologist work-up done ?mixture of anesthesia medication and prozac that pt had taken)  and has not any issues since    History of squamous cell carcinoma excision    Hyperlipidemia    Hypertension    Hypothyroidism    IC (interstitial cystitis)    Long COVID    Migraine    Osteoporosis    Pulmonary nodules    multiple per ct -- monitored by pcp   Right ureteral stone    RLS (restless legs syndrome)     Tobacco  History: Social History   Tobacco Use  Smoking Status Former   Current packs/day: 0.00   Average packs/day: 1.3 packs/day for 40.0 years (50.0 ttl pk-yrs)   Types: Cigarettes   Start date: 06/07/1968   Quit date: 06/07/2008   Years since quitting: 15.2  Smokeless Tobacco Never   Counseling given: Not Answered   Outpatient Medications Prior to Visit  Medication Sig Dispense Refill   acetaminophen (TYLENOL) 500 MG tablet Take 2 tablets (1,000 mg total) by mouth every 6 (six) hours as needed for moderate pain or mild pain. 60 tablet 0   albuterol (VENTOLIN HFA) 108 (90 Base) MCG/ACT inhaler Inhale 2 puffs into the lungs every 6 (six) hours as needed. 18 g 5   alendronate (FOSAMAX) 70 MG tablet Take 1 tablet (70 mg total) by mouth every 7 (seven) days. Take with a full glass of water on an empty stomach. 13 tablet 3   allopurinol (ZYLOPRIM) 100 MG tablet TAKE 1 TABLET BY MOUTH DAILY 100 tablet 2   amLODipine (NORVASC) 2.5 MG tablet TAKE 1 TABLET BY MOUTH  DAILY 90 tablet 3   baclofen (LIORESAL) 10 MG tablet Take 10 mg by mouth 3 (three) times daily. PRN     Cholecalciferol (VITAMIN D) 50 MCG (2000 UT) tablet Take 2,000 Units by mouth daily.     cyanocobalamin (VITAMIN B12) 1000 MCG/ML injection 1000 mcg (1 mg) injection once per per month or as directed 30 mL 1   diclofenac sodium (VOLTAREN) 1 % GEL Apply 4 g topically 4 (four) times daily. (Patient taking differently: Apply 4 g topically 4 (four) times daily as needed (pain).) 100 g 1   escitalopram (LEXAPRO) 20 MG tablet TAKE 1 TABLET BY MOUTH DAILY 90 tablet 3   ezetimibe (ZETIA) 10 MG tablet TAKE 1 TABLET BY MOUTH DAILY 100 tablet 2   famotidine (PEPCID) 20 MG tablet TAKE 1 TABLET BY MOUTH AT  BEDTIME 100 tablet 2   levothyroxine (SYNTHROID, LEVOTHROID) 50 MCG tablet Take 50 mcg by mouth daily before breakfast.     oxybutynin (DITROPAN) 5 MG tablet TAKE 1 TABLET BY MOUTH 2  TIMES DAILY AS NEEDED FOR  BLADDER SPASMS. (Patient taking  differently: TAKE 1 TABLET BY MOUTH 2  TIMES DAILY AS NEEDED FOR  BLADDER SPASMS.) 180 tablet 3   pantoprazole (PROTONIX) 40 MG tablet TAKE 1 TABLET BY MOUTH TWICE  DAILY 30 TO 60 MINUTES BEFORE  MEALS 200 tablet 0   rosuvastatin (CRESTOR) 40 MG tablet TAKE 1 TABLET BY MOUTH DAILY 100 tablet 2   Tiotropium Bromide Monohydrate (SPIRIVA RESPIMAT) 2.5 MCG/ACT AERS INHALE 2 PUFFS INTO THE LUNGS ONCE DAILY. 4 g 11   topiramate (TOPAMAX) 200 MG tablet TAKE 1 TABLET BY MOUTH  DAILY 90 tablet 3   No facility-administered medications prior to visit.     Review of Systems:   Constitutional: No weight loss  or gain, night sweats, fevers, chills, or lassitude. +fatigue  HEENT: No headaches, difficulty swallowing, tooth/dental problems, or sore throat. No sneezing, itching, ear ache, nasal congestion, or post nasal drip CV:  No chest pain, orthopnea, PND, swelling in lower extremities, anasarca, dizziness, palpitations, syncope Resp: +snoring, baseline shortness of breath with exertion. No excess mucus or change in color of mucus. No productive or non-productive. No hemoptysis. No wheezing.  No chest wall deformity GI:  No heartburn, indigestion, abdominal pain, nausea, vomiting, diarrhea, change in bowel habits, loss of appetite, bloody stools.  GU: No nocturia  Skin: No rash, lesions, ulcerations MSK:  No joint pain or swelling.   Neuro: No dizziness or lightheadedness.  Psych: No depression or anxiety. Mood stable. +sleep disturbance    Physical Exam:  BP 130/70 (BP Location: Right Arm, Cuff Size: Large)   Pulse (!) 56   Ht 5\' 2"  (1.575 m)   Wt 160 lb 12.8 oz (72.9 kg)   SpO2 97%   BMI 29.41 kg/m   GEN: Pleasant, interactive, well-appearing; in no acute distress HEENT:  Normocephalic and atraumatic. PERRLA. Sclera white. Nasal turbinates pink, moist and patent bilaterally. No rhinorrhea present. Oropharynx pink and moist, without exudate or edema. No lesions, ulcerations, or postnasal drip.  Mallampati II NECK:  Supple w/ fair ROM. No JVD present. Normal carotid impulses w/o bruits. Thyroid symmetrical with no goiter or nodules palpated. No lymphadenopathy.   CV: RRR, no m/r/g, no peripheral edema. Pulses intact, +2 bilaterally. No cyanosis, pallor or clubbing. PULMONARY:  Unlabored, regular breathing. Clear bilaterally A&P w/o wheezes/rales/rhonchi. No accessory muscle use.  GI: BS present and normoactive. Soft, non-tender to palpation. No organomegaly or masses detected.  MSK: No erythema, warmth or tenderness. Cap refil <2 sec all extrem. No deformities or joint swelling noted.  Neuro: A/Ox3. No focal deficits noted.   Skin: Warm, no lesions or rashe Psych: Normal affect and behavior. Judgement and thought content appropriate.     Lab Results:  CBC    Component Value Date/Time   WBC 7.3 03/28/2023 1353   RBC 4.36 03/28/2023 1353   HGB 13.5 03/28/2023 1353   HCT 40.7 03/28/2023 1353   PLT 196.0 03/28/2023 1353   MCV 93.2 03/28/2023 1353   MCH 31.9 05/06/2022 0937   MCHC 33.2 03/28/2023 1353   RDW 14.5 03/28/2023 1353   LYMPHSABS 2.1 03/28/2023 1353   MONOABS 0.5 03/28/2023 1353   EOSABS 0.1 03/28/2023 1353   BASOSABS 0.1 03/28/2023 1353    BMET    Component Value Date/Time   NA 142 03/28/2023 1353   NA 141 11/05/2016 1152   K 3.7 03/28/2023 1353   CL 112 03/28/2023 1353   CO2 23 03/28/2023 1353   GLUCOSE 91 03/28/2023 1353   BUN 13 03/28/2023 1353   BUN 16 09/26/2020 0000   CREATININE 0.81 03/28/2023 1353   CREATININE 0.77 05/23/2020 1635   CALCIUM 8.6 03/28/2023 1353   GFRNONAA >60 05/06/2022 0937   GFRAA 110.1 09/26/2020 0000    BNP No results found for: "BNP"   Imaging:  No results found.  Administration History     None          Latest Ref Rng & Units 03/06/2015   11:43 AM  PFT Results  FVC-Pre L 2.65   FVC-Predicted Pre % 85   FVC-Post L 2.81   FVC-Predicted Post % 90   Pre FEV1/FVC % % 74   Post FEV1/FCV % % 77   FEV1-Pre  L  1.95   FEV1-Predicted Pre % 81   FEV1-Post L 2.17   DLCO uncorrected ml/min/mmHg 14.49   DLCO UNC% % 65   DLVA Predicted % 84   TLC L 4.26   TLC % Predicted % 88   RV % Predicted % 88     No results found for: "NITRICOXIDE"      Assessment & Plan:   Mild obstructive sleep apnea Diagnosed via home sleep study with AHI 7/h. Symptoms include chronic fatigue, excessive daytime sleepiness, and snoring. Reviewed risks of untreated OSA. Discussed treatment options: CPAP therapy, oral appliances, weight loss, and positional sleeping. CPAP therapy recommended due to significant daytime fatigue and poor sleep quality. Explained CPAP benefits, including improved alertness and sleep quality. Discussed risks of untreated sleep apnea, such as difficulty controlling hypertension, diabetes, and pulmonary hypertension. Patient willing to try CPAP therapy. - Initiate CPAP therapy with a nasal mask - Provide instructions on CPAP maintenance and cleaning - Coordinate with medical supply company for CPAP machine and supplies - Schedule follow-up in 10-12 weeks for insurance compliance check and to assess treatment efficacy - Advise to contact office if experiencing issues with CPAP settings or symptoms.       Multiple pulmonary nodules Followed by lung cancer screening  COPD (chronic obstructive pulmonary disease) (HCC) Stable on current regimen without recent exacerbation. Action plan in place   I spent 35 minutes of dedicated to the care of this patient on the date of this encounter to include pre-visit review of records, face-to-face time with the patient discussing conditions above, post visit ordering of testing, clinical documentation with the electronic health record, making appropriate referrals as documented, and communicating necessary findings to members of the patients care team.  Noemi Chapel, NP 08/26/2023  Pt aware and understands NP's role.

## 2023-08-26 NOTE — Assessment & Plan Note (Signed)
Followed by lung cancer screening

## 2023-08-26 NOTE — Assessment & Plan Note (Addendum)
Diagnosed via home sleep study with AHI 7/h. Symptoms include chronic fatigue, excessive daytime sleepiness, and snoring. Reviewed risks of untreated OSA. Discussed treatment options: CPAP therapy, oral appliances, weight loss, and positional sleeping. CPAP therapy recommended due to significant daytime fatigue and poor sleep quality. Explained CPAP benefits, including improved alertness and sleep quality. Discussed risks of untreated sleep apnea, such as difficulty controlling hypertension, diabetes, and pulmonary hypertension. Patient willing to try CPAP therapy. - Initiate CPAP therapy with a nasal mask - Provide instructions on CPAP maintenance and cleaning - Coordinate with medical supply company for CPAP machine and supplies - Schedule follow-up in 10-12 weeks for insurance compliance check and to assess treatment efficacy - Advise to contact office if experiencing issues with CPAP settings or symptoms.

## 2023-08-26 NOTE — Assessment & Plan Note (Signed)
Stable on current regimen without recent exacerbation. Action plan in place

## 2023-09-06 ENCOUNTER — Other Ambulatory Visit: Payer: Self-pay | Admitting: Family Medicine

## 2023-09-27 ENCOUNTER — Ambulatory Visit: Payer: Medicare Other | Admitting: Family Medicine

## 2023-09-30 ENCOUNTER — Telehealth: Payer: Self-pay | Admitting: Internal Medicine

## 2023-09-30 DIAGNOSIS — K219 Gastro-esophageal reflux disease without esophagitis: Secondary | ICD-10-CM

## 2023-09-30 MED ORDER — PANTOPRAZOLE SODIUM 40 MG PO TBEC: DELAYED_RELEASE_TABLET | ORAL | 1 refills | Status: AC

## 2023-09-30 MED ORDER — FAMOTIDINE 20 MG PO TABS: 20.0000 mg | ORAL_TABLET | Freq: Every day | ORAL | 1 refills | Status: AC

## 2023-09-30 NOTE — Telephone Encounter (Signed)
I sent in 90 day supply with a refill but patient has not been seen in the office in over 2 years - once she has an appointment scheduled I can give her more refills

## 2023-09-30 NOTE — Telephone Encounter (Signed)
Patient called and stated that she received a call from her pharmacy requesting her to call her providers office to request refills on Famotidine 20 mg and Pantoprazole 40 mg. Patient also stated that with [employer] her insurance only covers 90 day supplies, but she was wondering if it was possible to have a 5-6 month supply so she did not need to keep calling for a refill every month. Please advise.

## 2023-10-06 ENCOUNTER — Telehealth: Payer: Self-pay | Admitting: Family Medicine

## 2023-10-06 NOTE — Telephone Encounter (Signed)
He can write the following letter and have her come by to pick it up and take it  To whom it may concern,  I am patient's primary care physician.  Patient has chronic low back pain which would make it challenging for her to sit for prolonged basis for jury duty.  Please excuse her from jury duty, Tana Conch, MD

## 2023-10-06 NOTE — Telephone Encounter (Signed)
Patient requesting letter from pcp to excuse her from [facility] on January 13th due to health issues . Please advise , Call back number is [phone]

## 2023-10-06 NOTE — Telephone Encounter (Signed)
See below

## 2023-10-07 NOTE — Telephone Encounter (Signed)
Patient called stating she will need the letter completed at least sometime next week so she can turn it in on time.

## 2023-10-07 NOTE — Telephone Encounter (Signed)
Letter has been written, called and made pt aware and pt will come by and pick up letter from [employer].

## 2023-10-10 ENCOUNTER — Other Ambulatory Visit: Payer: Self-pay

## 2023-10-10 MED ORDER — SPIRIVA RESPIMAT 2.5 MCG/ACT IN AERS: INHALATION_SPRAY | RESPIRATORY_TRACT | 11 refills | Status: AC

## 2023-10-10 MED ORDER — ALBUTEROL SULFATE HFA 108 (90 BASE) MCG/ACT IN AERS: 2.0000 | INHALATION_SPRAY | Freq: Four times a day (QID) | RESPIRATORY_TRACT | 5 refills | Status: AC | PRN

## 2023-10-28 ENCOUNTER — Encounter: Payer: Self-pay | Admitting: Family Medicine

## 2023-10-28 ENCOUNTER — Ambulatory Visit (INDEPENDENT_AMBULATORY_CARE_PROVIDER_SITE_OTHER): Payer: Medicare Other | Admitting: Family Medicine

## 2023-10-28 VITALS — BP 109/74 | HR 74 | Temp 97.0°F | Ht 62.0 in | Wt 146.8 lb

## 2023-10-28 DIAGNOSIS — J449 Chronic obstructive pulmonary disease, unspecified: Secondary | ICD-10-CM

## 2023-10-28 DIAGNOSIS — R319 Hematuria, unspecified: Secondary | ICD-10-CM

## 2023-10-28 DIAGNOSIS — R739 Hyperglycemia, unspecified: Secondary | ICD-10-CM

## 2023-10-28 DIAGNOSIS — E039 Hypothyroidism, unspecified: Secondary | ICD-10-CM

## 2023-10-28 DIAGNOSIS — I10 Essential (primary) hypertension: Secondary | ICD-10-CM | POA: Diagnosis not present

## 2023-10-28 DIAGNOSIS — F3341 Major depressive disorder, recurrent, in partial remission: Secondary | ICD-10-CM

## 2023-10-28 DIAGNOSIS — E785 Hyperlipidemia, unspecified: Secondary | ICD-10-CM

## 2023-10-28 DIAGNOSIS — R39198 Other difficulties with micturition: Secondary | ICD-10-CM | POA: Diagnosis not present

## 2023-10-28 LAB — URINALYSIS, ROUTINE W REFLEX MICROSCOPIC
Leukocytes,Ua: NEGATIVE
Nitrite: NEGATIVE
Specific Gravity, Urine: >=1.03 — AB (ref 1.000–1.030)
Urine Glucose: NEGATIVE
Urobilinogen, UA: 0.2 (ref 0.0–1.0)
pH: 5.5 (ref 5.0–8.0)

## 2023-10-28 LAB — COMPREHENSIVE METABOLIC PANEL
ALT: 18 U/L (ref 0–35)
AST: 20 U/L (ref 0–37)
Albumin: 4 g/dL (ref 3.5–5.2)
Alkaline Phosphatase: 97 U/L (ref 39–117)
BUN: 18 mg/dL (ref 6–23)
CO2: 23 meq/L (ref 19–32)
Calcium: 8.6 mg/dL (ref 8.4–10.5)
Chloride: 111 meq/L (ref 96–112)
Creatinine, Ser: 0.71 mg/dL (ref 0.40–1.20)
GFR: 86.49 mL/min (ref >60.00)
Glucose, Bld: 91 mg/dL (ref 70–99)
Potassium: 3.7 meq/L (ref 3.5–5.1)
Sodium: 139 meq/L (ref 135–145)
Total Bilirubin: 0.5 mg/dL (ref 0.2–1.2)
Total Protein: 6.3 g/dL (ref 6.0–8.3)

## 2023-10-28 LAB — CBC WITH DIFFERENTIAL/PLATELET
Basophils Absolute: 0 10*3/uL (ref 0.0–0.1)
Basophils Relative: 0.5 % (ref 0.0–3.0)
Eosinophils Absolute: 0.1 10*3/uL (ref 0.0–0.7)
Eosinophils Relative: 1.7 % (ref 0.0–5.0)
HCT: 39.8 % (ref 36.0–46.0)
Hemoglobin: 13.1 g/dL (ref 12.0–15.0)
Lymphocytes Relative: 31.4 % (ref 12.0–46.0)
Lymphs Abs: 2 10*3/uL (ref 0.7–4.0)
MCHC: 33 g/dL (ref 30.0–36.0)
MCV: 96.1 fL (ref 78.0–100.0)
Monocytes Absolute: 0.4 10*3/uL (ref 0.1–1.0)
Monocytes Relative: 6.4 % (ref 3.0–12.0)
Neutro Abs: 3.7 10*3/uL (ref 1.4–7.7)
Neutrophils Relative %: 60 % (ref 43.0–77.0)
Platelets: 190 10*3/uL (ref 150.0–400.0)
RBC: 4.14 Mil/uL (ref 3.87–5.11)
RDW: 14.7 % (ref 11.5–15.5)
WBC: 6.2 10*3/uL (ref 4.0–10.5)

## 2023-10-28 LAB — TSH: TSH: 1.34 u[IU]/mL (ref 0.35–5.50)

## 2023-10-28 MED ORDER — SERTRALINE HCL 100 MG PO TABS: 100.0000 mg | ORAL_TABLET | Freq: Every day | ORAL | 3 refills | Status: DC

## 2023-10-28 NOTE — Patient Instructions (Addendum)
 depression poorly controlled. Discussed optoins Switch within SSRI class add therapy Switch to SNRI class -she prefers to trial within SSrI Class (option 1) and will trade Lexapro  out for sertraline  100 mg in the evening- she can switch to morning if any difficulty with sleep - 6 week follow up to recheck- may need higher dose   Recommended follow up: Return in about 6 weeks (around 12/09/2023) for followup or sooner if needed.Schedule b4 you leave.   Taking the medicine sertraline  as directed and not missing any doses is one of the best things you can do to treat your depression.  Here are some things to keep in mind:  Side effects (stomach upset, some increased anxiety) may happen before you notice a benefit.  These side effects typically go away over time. Changes to your dose of medicine or a change in medication all together is sometimes necessary Most people need to be on medication at least 6-12 months Many people will notice an improvement within two weeks but the full effect of the medication can take up to 4-6 weeks Stopping the medication when you start feeling better often results in a return of symptoms If you start having thoughts of hurting yourself or others after starting this medicine, call our office immediately at [phone] or seek care through 911.

## 2023-10-28 NOTE — Progress Notes (Signed)
 Phone [phone] In person visit   Subjective:   Alison Brooks is a 68 y.o. 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  04/21/2007    Priority: High   Age-related osteoporosis without current pathological fracture 05/30/2023    Priority: Medium    Hyperglycemia 09/22/2022    Priority: Medium    Aortic atherosclerosis (HCC) 03/31/2021    Priority: Medium    Hypothyroidism 07/17/2019    Priority: Medium    Gout 06/28/2014    Priority: Medium    Vitamin B12 deficiency 06/28/2014    Priority: Medium    Multiple pulmonary nodules 06/28/2014    Priority: Medium    Essential hypertension 05/17/2014    Priority: Medium    RESTLESS LEG SYNDROME, SEVERE 03/14/2009    Priority: Medium    Chronic interstitial cystitis 01/20/2009    Priority: Medium    Depression, major, recurrent (HCC) 03/19/2008    Priority: Medium    Hyperlipidemia 02/17/2008    Priority: Medium    Former smoker 12/21/2007    Priority: Medium    Fibromyalgia 04/21/2007    Priority: Medium    Obesity (BMI 30-39.9) 01/14/2014    Priority: Low   Lateral epicondylitis (tennis elbow) 01/02/2014    Priority: Low   GERD (gastroesophageal reflux disease) 03/26/2013    Priority: Low   LEG CRAMPS 05/16/2009    Priority: Low   TUBULOVILLOUS ADENOMA, COLON 02/17/2008    Priority: Low   NEPHROLITHIASIS 02/17/2008    Priority: Low   MIGRAINE, CLASSICAL W/O INTRACTABLE MIGRAINE 06/09/2007    Priority: Low   SYMPTOM, MEMORY LOSS 06/09/2007    Priority: Low   Mild obstructive sleep apnea 08/26/2023   Snoring 03/29/2023   S/P  total right hip arthroplasty 05/18/2022   S/P right hemicolectomy 06/12/2021   History of total hysterectomy 07/17/2019   Vertigo 11/06/2016   Dyspnea 11/02/2014   Upper airway cough syndrome 11/02/2014    Medications- reviewed and updated Current Outpatient Medications  Medication Sig Dispense Refill   acetaminophen  (TYLENOL ) 500 MG tablet Take 2 tablets (1,000 mg total) by mouth every 6 (six) hours as needed for moderate pain or mild pain. 60 tablet 0   albuterol  (VENTOLIN  HFA) 108 (90 Base) MCG/ACT inhaler Inhale 2 puffs into the lungs every 6 (six) hours as needed. 18 g 5   allopurinol  (ZYLOPRIM ) 100 MG tablet TAKE 1 TABLET BY MOUTH DAILY 100 tablet 2   amLODipine  (NORVASC ) 2.5 MG tablet TAKE 1 TABLET BY MOUTH  DAILY 90 tablet 3   baclofen  (LIORESAL ) 10 MG tablet Take 10 mg by mouth 3 (three) times daily. PRN     Cholecalciferol (VITAMIN D) 50 MCG (2000 UT) tablet Take 2,000 Units by mouth daily.     cyanocobalamin  (VITAMIN B12) 1000 MCG/ML injection 1000 mcg (1 mg) injection once per per month or as directed 30 mL 1   diclofenac  sodium (VOLTAREN ) 1 % GEL Apply 4 g topically 4 (four) times daily. (Patient taking differently: Apply 4 g topically 4 (four) times daily as needed (pain).) 100 g 1   ezetimibe  (ZETIA ) 10  MG tablet TAKE 1 TABLET BY MOUTH DAILY 100 tablet 2   famotidine  (PEPCID ) 20 MG tablet Take 1 tablet (20 mg total) by mouth at bedtime. OFFICE VISIT FOR FURTHER REFILLS 90 tablet 1   levothyroxine  (SYNTHROID , LEVOTHROID) 50 MCG tablet Take 50 mcg by mouth daily before breakfast.     pantoprazole  (PROTONIX ) 40 MG tablet TAKE 1 TABLET BY MOUTH  TWICE DAILY 30 TO 60  MINUTES BEFORE MEALS OFFICE VISIT FOR FURTHER REFILLS!!!!! 90 tablet 1   rosuvastatin  (CRESTOR ) 40 MG tablet TAKE 1 TABLET BY MOUTH DAILY 100 tablet 2   sertraline  (ZOLOFT ) 100 MG tablet Take 1 tablet (100 mg total) by mouth daily. 90 tablet 3   Tiotropium Bromide  Monohydrate (SPIRIVA  RESPIMAT) 2.5 MCG/ACT AERS  INHALE 2 PUFFS INTO THE LUNGS ONCE DAILY. 4 g 11   topiramate  (TOPAMAX ) 200 MG tablet TAKE 1 TABLET BY MOUTH  DAILY 90 tablet 3   No current facility-administered medications for this visit.     Objective:  BP 109/74   Pulse 74   Temp (!) 97 F (36.1 C) (Temporal)   Ht 5' 2 (1.575 m)   Wt 146 lb 12.8 oz (66.6 kg)   SpO2 97%   BMI 26.85 kg/m  Gen: NAD, resting comfortably CV: RRR no murmurs rubs or gallops Lungs: CTAB no crackles, wheeze, rhonchi Ext: no edema Skin: warm, dry     Assessment and Plan   #osteoporosis - didn't tolerate fosamax - discussed reclast but want to get her stabilized from depression first  # COPD S: restarted Spiriva  with cold weather. Also needing albuterol  twice a week A/P:reasonable control continue current medications- she can check with her pharmacy as prescription went up to $45 foalbuterol  and previously much lower- we did not change the prescription.    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-patient on long-term Topamax  200 mg which has been helpful- still helpful some but bothering more lately- could contribute to depression  #Hypertension S: Compliant with amlodipine  2.5 mg -has lost weight cutting out bread, sweets, drinks other than water A/P: stable- continue current medicines    # Hyperlipidemia with aortic atherosclerosis and moderate coronary artery calcifications on CT lung cancer screening S: compliant with rosuvastatin  40mg  -Also on Zetia  10 mg  Lab Results  Component Value Date   CHOL 136 03/28/2023   HDL 49.80 03/28/2023   LDLCALC 65 03/28/2023   LDLDIRECT 145.0 09/22/2022   TRIG 105.0 03/28/2023   CHOLHDL 3 03/28/2023  A/P: lipids at goal- continue current medications   #hypothyroidism-follows with endocrinology Dr. Dale S: compliant On thyroid  medication-levothyroxine  50 mcg           Lab Results  Component Value Date   TSH 1.14 03/28/2023  A/P:hopefully stable- update tsh today. Continue  current meds for now     #Gout- has not been on allopurinol  in 2024 and no flares  #Depression S: compliant with lexapro  20mg . Felt like last 1-2 months has had worsening symptoms despite continuing to take medicine.  -Prozac  helpful in past but caused lip and tongue numbness and amitriptyline  years ago did not like anticholinergic effects - felt shorter tempered with grandchild with attention deficit disorder. Notes agitation as well. Interes has dropped- not even cleaning the house    10/28/2023    1:11 PM 07/26/2023   10:25 AM 05/30/2023   10:57 AM  Depression screen PHQ 2/9  Decreased Interest 2 0 1  Down, Depressed, Hopeless 2 0 0  PHQ -  2 Score 4 0 1  Altered sleeping 2 0 3  Tired, decreased energy 3 0 3  Change in appetite 0 0 1  Feeling bad or failure about yourself  1 0 0  Trouble concentrating 2 0 3  Moving slowly or fidgety/restless 1 0 0  Suicidal thoughts 0 0 0  PHQ-9 Score 13 0 11  Difficult doing work/chores Somewhat difficult Not difficult at all Somewhat difficult  A/P:  depression poorly controlled. Discussed optoins Switch within SSRI class add therapy Switch to SNRI class -she prefers to trial within SSrI Class (option 1) and will trade Lexapro  out for sertraline  100 mg in the evening- she can switch to morning if any difficulty with sleep - 6 week follow up to recheck- may need higher dose   #overactive bladder S: OAB using oxybutynin  sparingly- helpful when used in past- advised against as only peeing 3x a day A/P: with infrequent urination and multiple abnormal urinalysis- recheck today but may refer to urology depending on findings   #Gerd/history of esophageal stricture- doing well on protonix  40mg  daily plus pepcid  twice daily other than when on fosamax   #OSA diagnosed in 2024-now on nasal CPAP and has follow up soon  Recommended follow up: Return in about 6 weeks (around 12/09/2023) for followup or sooner if needed.Schedule b4 you leave. Future  Appointments  Date Time Provider Department Center  11/04/2023 11:30 AM Malachy Comer GAILS, NP [facility] None  07/31/2024 10:15 AM [facility] ANNUAL WELLNESS VISIT 1 [facility]    Lab/Order associations:   ICD-10-CM   1. Essential hypertension  I10     2. Chronic obstructive pulmonary disease, unspecified COPD type (HCC) Chronic J44.9     3. Hyperglycemia  R73.9     4. Hyperlipidemia, unspecified hyperlipidemia type  E78.5 Comprehensive metabolic panel    CBC with Differential/Platelet    5. Hypothyroidism, unspecified type  E03.9 TSH    6. Recurrent major depressive disorder, in partial remission (HCC)  F33.41     7. Infrequent urination  R39.198 Urinalysis, Routine w reflex microscopic    Urine Culture    8. Hematuria, unspecified type  R31.9 Urinalysis, Routine w reflex microscopic    Urine Culture      Meds ordered this encounter  Medications   sertraline  (ZOLOFT ) 100 MG tablet    Sig: Take 1 tablet (100 mg total) by mouth daily.    Dispense:  90 tablet    Refill:  3    Return precautions advised.  Garnette Lukes, MD

## 2023-10-29 LAB — URINE CULTURE
MICRO NUMBER:: 15915037
Result:: NO GROWTH
SPECIMEN QUALITY:: ADEQUATE

## 2023-10-31 ENCOUNTER — Other Ambulatory Visit: Payer: Self-pay

## 2023-10-31 DIAGNOSIS — R319 Hematuria, unspecified: Secondary | ICD-10-CM

## 2023-11-04 ENCOUNTER — Encounter: Payer: Self-pay | Admitting: Nurse Practitioner

## 2023-11-04 ENCOUNTER — Telehealth: Payer: Medicare Other | Admitting: Nurse Practitioner

## 2023-11-04 DIAGNOSIS — R058 Other specified cough: Secondary | ICD-10-CM | POA: Diagnosis not present

## 2023-11-04 DIAGNOSIS — J449 Chronic obstructive pulmonary disease, unspecified: Secondary | ICD-10-CM | POA: Diagnosis not present

## 2023-11-04 DIAGNOSIS — R918 Other nonspecific abnormal finding of lung field: Secondary | ICD-10-CM

## 2023-11-04 DIAGNOSIS — G4733 Obstructive sleep apnea (adult) (pediatric): Secondary | ICD-10-CM | POA: Diagnosis not present

## 2023-11-04 DIAGNOSIS — J310 Chronic rhinitis: Secondary | ICD-10-CM | POA: Diagnosis not present

## 2023-11-04 MED ORDER — FLUTICASONE PROPIONATE 50 MCG/ACT NA SUSP: 2.0000 | Freq: Every day | NASAL | 2 refills | Status: AC

## 2023-11-04 NOTE — Assessment & Plan Note (Signed)
 See above

## 2023-11-04 NOTE — Patient Instructions (Addendum)
 Continue CPAP every night, minimum of 4-6 hours a night.  Change equipment as directed. Wash your tubing with warm soap and water daily, hang to dry. Wash humidifier portion weekly. Use bottled, distilled water and change daily Be aware of reduced alertness and do not drive or operate heavy machinery if experiencing this or drowsiness.  Exercise encouraged, as tolerated. Healthy weight management discussed.  Avoid or decrease alcohol consumption and medications that make you more sleepy, if possible. Notify if persistent daytime sleepiness occurs even with consistent use of PAP therapy.   We discussed how untreated sleep apnea puts an individual at risk for cardiac arrhthymias, pulm HTN, DM, stroke and increases their risk for daytime accidents.   Adjust CPAP settings to 5-9 cmH2O - order for change placed today We may need to change your mask if this doesn't help  Start flonase  nasal spray 2 sprays each nostril before putting on CPAP   Follow up in 4 weeks with Dr. Neysa or Izetta Laquida Cotrell,NP to see CPAP is going, or sooner, if needed

## 2023-11-04 NOTE — Assessment & Plan Note (Signed)
 Followed by lung cancer screening program

## 2023-11-04 NOTE — Progress Notes (Deleted)
 @Patient  ID: Alison Brooks, female    DOB: 1956-05-10, 68 y.o.   MRN: [id number]  Chief Complaint  Patient presents with   Follow-up    F/u cpap pt states she still is trying to get use to wearing it, and coughing while on it.     Referring provider: Katrinka Garnette KIDD, MD  HPI: 68 year old female, former smoker followed for OSA and. Past medical history significant for migraines, HTN, aortic atherosclerosis, multiple pulmonary nodules, COPD, GERD, hypothyroid, chronic cystitis, HLD, RLS, hx of hemicolectomy. She is followed by the lung cancer screening program as well.   TEST/EVENTS:  04/11/2023 HST: AHI 7.7/h (4%), 22.8/h (3%), SpO2 low 88%  03/29/2023: OV with Dr. Neysa for sleep consult.  Retired engineer, civil (consulting).  Snores pretty routinely.  Sometimes wakes with a choke or cough.  Husband describes witnessed apneas.  Admits tired routinely and says she could sleep 24 hours a day.  No sleep medicines.  Drinks 3 cups of coffee.  Has had ENT surgery for tonsils and repair of nasal fracture in the past.  Usual bedtime between midnight and 1 AM with short sleep latency.  Waking 3-4 times a night.  Some restless leg but no complex parasomnias.  Frequent headaches.  Home sleep study ordered for further evaluation.  08/26/2023: Today - follow up Discussed the use of AI scribe software for clinical note transcription with the patient, who gave verbal consent to proceed.    The patient, with a history of snoring and chronic fatigue, was suspected to have sleep apnea. She reports feeling tired all the time, with her spouse noting episodes of apnea during sleep. The patient has undergone a home sleep study, which revealed mild sleep apnea. Despite the classification, the patient experiences significant daytime symptoms, including a persistent sense of tiredness and a propensity to sleep during the day. She reports that she could sleep for 24 hours a day, indicating a state of chronic fatigue. The patient also  mentions that she already sleeps on her side, a positional adjustment often recommended for mild sleep apnea, but this has not alleviated her symptoms. She denies drowsy driving, morning headaches, sleep parasomnias/paralysis. She does not take any sleep aids. Breathing is stable. No issues with her COPD. No significant cough or wheezing. Uses Spiriva  and infrequent PRN SABA.       Allergies  Allergen Reactions   Latex Other (See Comments)    blisters   Zofran  [Ondansetron  Hcl] Nausea And Vomiting and Other (See Comments)    headache    Immunization History  Administered Date(s) Administered   Fluad Quad(high Dose 65+) 07/17/2019, 10/02/2021   Influenza Whole 08/11/1999, 08/11/2007, 07/25/2009   Influenza,inj,Quad PF,6+ Mos 09/03/2013, 07/12/2014, 08/04/2015   Influenza-Unspecified 07/25/2017   Moderna Sars-Covid-2 Vaccination 11/17/2019, 12/17/2019, 08/28/2020   PNEUMOCOCCAL CONJUGATE-20 10/02/2021   Pneumococcal Polysaccharide-23 07/17/2019   Td 10/25/1998, 10/25/2006   Tdap 04/20/2018    Past Medical History:  Diagnosis Date   Anxiety    Arthritis    back, neck   Chronic back pain    COPD (chronic obstructive pulmonary disease) (HCC) 2021   after Covid   Depression    Dyspnea    Since covid , NOW ON INHALERS   Fatty liver    Fibromyalgia    GERD (gastroesophageal reflux disease)    Heart murmur    Hematuria    History of adenomatous polyp of colon    History of COVID-19 10/2020   History of kidney stones  History of seizure    x1 2007 post op (per pt neurologist work-up done ?mixture of anesthesia medication and prozac  that pt had taken)  and has not any issues since    History of squamous cell carcinoma excision    Hyperlipidemia    Hypertension    Hypothyroidism    IC (interstitial cystitis)    Long COVID    Migraine    Osteoporosis    Pulmonary nodules    multiple per ct -- monitored by pcp   Right ureteral stone    RLS (restless legs syndrome)      Tobacco History: Social History   Tobacco Use  Smoking Status Former   Current packs/day: 0.00   Average packs/day: 1.3 packs/day for 40.0 years (50.0 ttl pk-yrs)   Types: Cigarettes   Start date: 06/07/1968   Quit date: 06/07/2008   Years since quitting: 15.4  Smokeless Tobacco Never   Counseling given: Not Answered   Outpatient Medications Prior to Visit  Medication Sig Dispense Refill   acetaminophen  (TYLENOL ) 500 MG tablet Take 2 tablets (1,000 mg total) by mouth every 6 (six) hours as needed for moderate pain or mild pain. 60 tablet 0   albuterol  (VENTOLIN  HFA) 108 (90 Base) MCG/ACT inhaler Inhale 2 puffs into the lungs every 6 (six) hours as needed. 18 g 5   allopurinol  (ZYLOPRIM ) 100 MG tablet TAKE 1 TABLET BY MOUTH DAILY 100 tablet 2   amLODipine  (NORVASC ) 2.5 MG tablet TAKE 1 TABLET BY MOUTH  DAILY 90 tablet 3   baclofen  (LIORESAL ) 10 MG tablet Take 10 mg by mouth 3 (three) times daily. PRN     Cholecalciferol (VITAMIN D) 50 MCG (2000 UT) tablet Take 2,000 Units by mouth daily.     cyanocobalamin  (VITAMIN B12) 1000 MCG/ML injection 1000 mcg (1 mg) injection once per per month or as directed 30 mL 1   diclofenac  sodium (VOLTAREN ) 1 % GEL Apply 4 g topically 4 (four) times daily. (Patient taking differently: Apply 4 g topically 4 (four) times daily as needed (pain).) 100 g 1   ezetimibe  (ZETIA ) 10 MG tablet TAKE 1 TABLET BY MOUTH DAILY 100 tablet 2   famotidine  (PEPCID ) 20 MG tablet Take 1 tablet (20 mg total) by mouth at bedtime. OFFICE VISIT FOR FURTHER REFILLS 90 tablet 1   levothyroxine  (SYNTHROID , LEVOTHROID) 50 MCG tablet Take 50 mcg by mouth daily before breakfast.     pantoprazole  (PROTONIX ) 40 MG tablet TAKE 1 TABLET BY MOUTH  TWICE DAILY 30 TO 60  MINUTES BEFORE MEALS OFFICE VISIT FOR FURTHER REFILLS!!!!! 90 tablet 1   rosuvastatin  (CRESTOR ) 40 MG tablet TAKE 1 TABLET BY MOUTH DAILY 100 tablet 2   sertraline  (ZOLOFT ) 100 MG tablet Take 1 tablet (100 mg total) by  mouth daily. 90 tablet 3   Tiotropium Bromide  Monohydrate (SPIRIVA  RESPIMAT) 2.5 MCG/ACT AERS INHALE 2 PUFFS INTO THE LUNGS ONCE DAILY. 4 g 11   topiramate  (TOPAMAX ) 200 MG tablet TAKE 1 TABLET BY MOUTH  DAILY 90 tablet 3   No facility-administered medications prior to visit.     Review of Systems:   Constitutional: No weight loss or gain, night sweats, fevers, chills, or lassitude. +fatigue  HEENT: No headaches, difficulty swallowing, tooth/dental problems, or sore throat. No sneezing, itching, ear ache, nasal congestion, or post nasal drip CV:  No chest pain, orthopnea, PND, swelling in lower extremities, anasarca, dizziness, palpitations, syncope Resp: +snoring, baseline shortness of breath with exertion. No excess mucus or change in  color of mucus. No productive or non-productive. No hemoptysis. No wheezing.  No chest wall deformity GI:  No heartburn, indigestion, abdominal pain, nausea, vomiting, diarrhea, change in bowel habits, loss of appetite, bloody stools.  GU: No nocturia  Skin: No rash, lesions, ulcerations MSK:  No joint pain or swelling.   Neuro: No dizziness or lightheadedness.  Psych: No depression or anxiety. Mood stable. +sleep disturbance    Physical Exam:  There were no vitals taken for this visit.  GEN: Pleasant, interactive, well-appearing; in no acute distress HEENT:  Normocephalic and atraumatic. PERRLA. Sclera white. Nasal turbinates pink, moist and patent bilaterally. No rhinorrhea present. Oropharynx pink and moist, without exudate or edema. No lesions, ulcerations, or postnasal drip. Mallampati II NECK:  Supple w/ fair ROM. No JVD present. Normal carotid impulses w/o bruits. Thyroid  symmetrical with no goiter or nodules palpated. No lymphadenopathy.   CV: RRR, no m/r/g, no peripheral edema. Pulses intact, +2 bilaterally. No cyanosis, pallor or clubbing. PULMONARY:  Unlabored, regular breathing. Clear bilaterally A&P w/o wheezes/rales/rhonchi. No accessory  muscle use.  GI: BS present and normoactive. Soft, non-tender to palpation. No organomegaly or masses detected.  MSK: No erythema, warmth or tenderness. Cap refil <2 sec all extrem. No deformities or joint swelling noted.  Neuro: A/Ox3. No focal deficits noted.   Skin: Warm, no lesions or rashe Psych: Normal affect and behavior. Judgement and thought content appropriate.     Lab Results:  CBC    Component Value Date/Time   WBC 6.2 10/28/2023 1420   RBC 4.14 10/28/2023 1420   HGB 13.1 10/28/2023 1420   HCT 39.8 10/28/2023 1420   PLT 190.0 10/28/2023 1420   MCV 96.1 10/28/2023 1420   MCH 31.9 05/06/2022 0937   MCHC 33.0 10/28/2023 1420   RDW 14.7 10/28/2023 1420   LYMPHSABS 2.0 10/28/2023 1420   MONOABS 0.4 10/28/2023 1420   EOSABS 0.1 10/28/2023 1420   BASOSABS 0.0 10/28/2023 1420    BMET    Component Value Date/Time   NA 139 10/28/2023 1420   NA 141 11/05/2016 1152   K 3.7 10/28/2023 1420   CL 111 10/28/2023 1420   CO2 23 10/28/2023 1420   GLUCOSE 91 10/28/2023 1420   BUN 18 10/28/2023 1420   BUN 16 09/26/2020 0000   CREATININE 0.71 10/28/2023 1420   CREATININE 0.77 05/23/2020 1635   CALCIUM  8.6 10/28/2023 1420   GFRNONAA >60 05/06/2022 0937   GFRAA 110.1 09/26/2020 0000    BNP No results found for: BNP   Imaging:  No results found.  Administration History     None          Latest Ref Rng & Units 03/06/2015   11:43 AM  PFT Results  FVC-Pre L 2.65   FVC-Predicted Pre % 85   FVC-Post L 2.81   FVC-Predicted Post % 90   Pre FEV1/FVC % % 74   Post FEV1/FCV % % 77   FEV1-Pre L 1.95   FEV1-Predicted Pre % 81   FEV1-Post L 2.17   DLCO uncorrected ml/min/mmHg 14.49   DLCO UNC% % 65   DLVA Predicted % 84   TLC L 4.26   TLC % Predicted % 88   RV % Predicted % 88     No results found for: NITRICOXIDE      Assessment & Plan:   No problem-specific Assessment & Plan notes found for this encounter.    I spent 35 minutes of dedicated to  the care of this  patient on the date of this encounter to include pre-visit review of records, face-to-face time with the patient discussing conditions above, post visit ordering of testing, clinical documentation with the electronic health record, making appropriate referrals as documented, and communicating necessary findings to members of the patients care team.  Comer LULLA Rouleau, NP 11/04/2023  Pt aware and understands NP's role.

## 2023-11-04 NOTE — Assessment & Plan Note (Signed)
 CPAP induced rhinitis. See above.

## 2023-11-04 NOTE — Assessment & Plan Note (Signed)
 Mild OSA on CPAP. Difficulties with tolerance due to rhinitis. Postnasal drainage likely causing upper airway irritation and cough. Will decrease her pressure settings to 5-9 cmH2O. Advised her to start intranasal steroid at bedtime. If symptoms persist, will change her to a full face mask from a nasal mask. Reassess at follow up. Encouraged to continue utilizing nightly. Aware of risks of untreated OSA. If she continues to have difficulties with CPAP, could look at oral appliance. Safe driving practices reviewed.   Patient Instructions  Continue CPAP every night, minimum of 4-6 hours a night.  Change equipment as directed. Wash your tubing with warm soap and water daily, hang to dry. Wash humidifier portion weekly. Use bottled, distilled water and change daily Be aware of reduced alertness and do not drive or operate heavy machinery if experiencing this or drowsiness.  Exercise encouraged, as tolerated. Healthy weight management discussed.  Avoid or decrease alcohol consumption and medications that make you more sleepy, if possible. Notify if persistent daytime sleepiness occurs even with consistent use of PAP therapy.   We discussed how untreated sleep apnea puts an individual at risk for cardiac arrhthymias, pulm HTN, DM, stroke and increases their risk for daytime accidents.   Adjust CPAP settings to 5-9 cmH2O - order for change placed today We may need to change your mask if this doesn't help  Start flonase  nasal spray 2 sprays each nostril before putting on CPAP   Follow up in 4 weeks with Dr. Neysa or Alison Jaivyn Gulla,NP to see CPAP is going, or sooner, if needed

## 2023-11-04 NOTE — Progress Notes (Signed)
 Patient ID: Alison Brooks, female     DOB: October 05, 1956, 68 y.o.      MRN: [id number]  Chief Complaint  Patient presents with   Follow-up    F/u cpap pt states she still is trying to get use to wearing it, and coughing while on it.     Virtual Visit via Video Note  I connected with Alison Brooks on 11/04/23 at 11:30 AM EST by a video enabled telemedicine application and verified that I am speaking with the correct person using two identifiers.  Location: Patient: Home Provider: Office   I discussed the limitations of evaluation and management by telemedicine and the availability of in person appointments. The patient expressed understanding and agreed to proceed.  History of Present Illness: 68 year old female, former smoker followed for OSA and COPD. Past medical history significant for migraines, HTN, aortic atherosclerosis, multiple pulmonary nodules, COPD, GERD, hypothyroid, chronic cystitis, HLD, RLS, hx of hemicolectomy. She is followed by the lung cancer screening program as well.   TEST/EVENTS:  04/11/2023 HST: AHI 7.7/h (4%), 22.8/h (3%), SpO2 low 88%  03/29/2023: OV with Dr. Neysa for sleep consult.  Retired engineer, civil (consulting).  Snores pretty routinely.  Sometimes wakes with a choke or cough.  Husband describes witnessed apneas.  Admits tired routinely and says she could sleep 24 hours a day.  No sleep medicines.  Drinks 3 cups of coffee.  Has had ENT surgery for tonsils and repair of nasal fracture in the past.  Usual bedtime between midnight and 1 AM with short sleep latency.  Waking 3-4 times a night.  Some restless leg but no complex parasomnias.  Frequent headaches.  Home sleep study ordered for further evaluation.  08/26/2023: OV with Meril Dray NP The patient, with a history of snoring and chronic fatigue, was suspected to have sleep apnea. She reports feeling tired all the time, with her spouse noting episodes of apnea during sleep. The patient has undergone a home sleep study, which  revealed mild sleep apnea. Despite the classification, the patient experiences significant daytime symptoms, including a persistent sense of tiredness and a propensity to sleep during the day. She reports that she could sleep for 24 hours a day, indicating a state of chronic fatigue. The patient also mentions that she already sleeps on her side, a positional adjustment often recommended for mild sleep apnea, but this has not alleviated her symptoms. She denies drowsy driving, morning headaches, sleep parasomnias/paralysis. She does not take any sleep aids. Breathing is stable. No issues with her COPD. No significant cough or wheezing. Uses Spiriva  and infrequent PRN SABA.   11/04/2023: Today - follow up Patient presents today for follow-up via virtual visit.  Since her last visit, she started on CPAP therapy.  She has been having difficulties adjusting to this.  She feels like she tries to get to the 4-hour mark most nights and then usually takes it off.  It is caused her to have increased nasal congestion and a cough at night.  Drainage is clear.  She denies any fevers, facial tenderness, purulent sputum production.  Does not really have any issues with her cough during the day.  Breathing feels like it is at her baseline.  Has not really noticed if she is sleeping better or feeling better rested with the CPAP due to the issues she is having with it at night.  She is currently wearing a nasal mask.  No issues with drowsy driving, morning headaches or sleep parasomnia/paralysis.  10/04/2023-11/02/2023: CPAP 5-15 cmH2O 28/30 days; 60% >4 hr; average use 4 hr 5 min Pressure 95th 9.2 Leaks 95th 17.6 AHI 1.9  Allergies  Allergen Reactions   Latex Other (See Comments)    blisters   Zofran  [Ondansetron  Hcl] Nausea And Vomiting and Other (See Comments)    headache   Immunization History  Administered Date(s) Administered   Fluad Quad(high Dose 65+) 07/17/2019, 10/02/2021   Influenza Whole 08/11/1999,  08/11/2007, 07/25/2009   Influenza,inj,Quad PF,6+ Mos 09/03/2013, 07/12/2014, 08/04/2015   Influenza-Unspecified 07/25/2017   Moderna Sars-Covid-2 Vaccination 11/17/2019, 12/17/2019, 08/28/2020   PNEUMOCOCCAL CONJUGATE-20 10/02/2021   Pneumococcal Polysaccharide-23 07/17/2019   Td 10/25/1998, 10/25/2006   Tdap 04/20/2018   Past Medical History:  Diagnosis Date   Anxiety    Arthritis    back, neck   Chronic back pain    COPD (chronic obstructive pulmonary disease) (HCC) 2021   after Covid   Depression    Dyspnea    Since covid , NOW ON INHALERS   Fatty liver    Fibromyalgia    GERD (gastroesophageal reflux disease)    Heart murmur    Hematuria    History of adenomatous polyp of colon    History of COVID-19 10/2020   History of kidney stones    History of seizure    x1 2007 post op (per pt neurologist work-up done ?mixture of anesthesia medication and prozac  that pt had taken)  and has not any issues since    History of squamous cell carcinoma excision    Hyperlipidemia    Hypertension    Hypothyroidism    IC (interstitial cystitis)    Long COVID    Migraine    Osteoporosis    Pulmonary nodules    multiple per ct -- monitored by pcp   Right ureteral stone    RLS (restless legs syndrome)     Tobacco History: Social History   Tobacco Use  Smoking Status Former   Current packs/day: 0.00   Average packs/day: 1.3 packs/day for 40.0 years (50.0 ttl pk-yrs)   Types: Cigarettes   Start date: 06/07/1968   Quit date: 06/07/2008   Years since quitting: 15.4  Smokeless Tobacco Never   Counseling given: Not Answered   Outpatient Medications Prior to Visit  Medication Sig Dispense Refill   acetaminophen  (TYLENOL ) 500 MG tablet Take 2 tablets (1,000 mg total) by mouth every 6 (six) hours as needed for moderate pain or mild pain. 60 tablet 0   albuterol  (VENTOLIN  HFA) 108 (90 Base) MCG/ACT inhaler Inhale 2 puffs into the lungs every 6 (six) hours as needed. 18 g 5    allopurinol  (ZYLOPRIM ) 100 MG tablet TAKE 1 TABLET BY MOUTH DAILY 100 tablet 2   amLODipine  (NORVASC ) 2.5 MG tablet TAKE 1 TABLET BY MOUTH  DAILY 90 tablet 3   baclofen  (LIORESAL ) 10 MG tablet Take 10 mg by mouth 3 (three) times daily. PRN     Cholecalciferol (VITAMIN D) 50 MCG (2000 UT) tablet Take 2,000 Units by mouth daily.     cyanocobalamin  (VITAMIN B12) 1000 MCG/ML injection 1000 mcg (1 mg) injection once per per month or as directed 30 mL 1   diclofenac  sodium (VOLTAREN ) 1 % GEL Apply 4 g topically 4 (four) times daily. (Patient taking differently: Apply 4 g topically 4 (four) times daily as needed (pain).) 100 g 1   ezetimibe  (ZETIA ) 10 MG tablet TAKE 1 TABLET BY MOUTH DAILY 100 tablet 2   famotidine  (PEPCID ) 20 MG tablet  Take 1 tablet (20 mg total) by mouth at bedtime. OFFICE VISIT FOR FURTHER REFILLS 90 tablet 1   levothyroxine  (SYNTHROID , LEVOTHROID) 50 MCG tablet Take 50 mcg by mouth daily before breakfast.     pantoprazole  (PROTONIX ) 40 MG tablet TAKE 1 TABLET BY MOUTH  TWICE DAILY 30 TO 60  MINUTES BEFORE MEALS OFFICE VISIT FOR FURTHER REFILLS!!!!! 90 tablet 1   rosuvastatin  (CRESTOR ) 40 MG tablet TAKE 1 TABLET BY MOUTH DAILY 100 tablet 2   sertraline  (ZOLOFT ) 100 MG tablet Take 1 tablet (100 mg total) by mouth daily. 90 tablet 3   Tiotropium Bromide  Monohydrate (SPIRIVA  RESPIMAT) 2.5 MCG/ACT AERS INHALE 2 PUFFS INTO THE LUNGS ONCE DAILY. 4 g 11   topiramate  (TOPAMAX ) 200 MG tablet TAKE 1 TABLET BY MOUTH  DAILY 90 tablet 3   No facility-administered medications prior to visit.     Review of Systems:   Constitutional: No weight loss or gain, night sweats, fevers, chills, or lassitude. +Fatigue  HEENT: No headaches, difficulty swallowing, tooth/dental problems, or sore throat. No sneezing, itching, ear ache +nasal congestion, post nasal drip CV:  No chest pain, orthopnea, PND, swelling in lower extremities, anasarca, dizziness, palpitations, syncope Resp: +snoring (without CPAP);  cough. No shortness of breath with exertion or at rest. No excess mucus or change in color of mucus. No hemoptysis. No wheezing.  No chest wall deformity GI:  No heartburn, indigestion, abdominal pain, nausea, vomiting, diarrhea, change in bowel habits, loss of appetite, bloody stools.  GU: No nocturia  Skin: No rash, lesions, ulcerations MSK:  No joint pain or swelling.   Neuro: No dizziness or lightheadedness.  Psych: No depression or anxiety. Mood stable.   Observations/Objective: Patient is well-developed, well-nourished in no acute distress. A&Ox3. Resting comfortably at home. Unlabored breathing. Speech is clear and coherent with logical content.    Assessment and Plan: Mild obstructive sleep apnea Mild OSA on CPAP. Difficulties with tolerance due to rhinitis. Postnasal drainage likely causing upper airway irritation and cough. Will decrease her pressure settings to 5-9 cmH2O. Advised her to start intranasal steroid at bedtime. If symptoms persist, will change her to a full face mask from a nasal mask. Reassess at follow up. Encouraged to continue utilizing nightly. Aware of risks of untreated OSA. If she continues to have difficulties with CPAP, could look at oral appliance. Safe driving practices reviewed.   Patient Instructions  Continue CPAP every night, minimum of 4-6 hours a night.  Change equipment as directed. Wash your tubing with warm soap and water daily, hang to dry. Wash humidifier portion weekly. Use bottled, distilled water and change daily Be aware of reduced alertness and do not drive or operate heavy machinery if experiencing this or drowsiness.  Exercise encouraged, as tolerated. Healthy weight management discussed.  Avoid or decrease alcohol consumption and medications that make you more sleepy, if possible. Notify if persistent daytime sleepiness occurs even with consistent use of PAP therapy.   We discussed how untreated sleep apnea puts an individual at risk for  cardiac arrhthymias, pulm HTN, DM, stroke and increases their risk for daytime accidents.   Adjust CPAP settings to 5-9 cmH2O - order for change placed today We may need to change your mask if this doesn't help  Start flonase  nasal spray 2 sprays each nostril before putting on CPAP   Follow up in 4 weeks with Dr. Neysa or Izetta Taleen Prosser,NP to see CPAP is going, or sooner, if needed   Rhinitis CPAP induced rhinitis. See  above.   Upper airway cough syndrome See above  COPD (chronic obstructive pulmonary disease) (HCC) Stable on current regimen. Cough appears to be upper airway in nature. Action plan in place.   Multiple pulmonary nodules Followed by lung cancer screening program  I discussed the assessment and treatment plan with the patient. The patient was provided an opportunity to ask questions and all were answered. The patient agreed with the plan and demonstrated an understanding of the instructions.   The patient was advised to call back or seek an in-person evaluation if the symptoms worsen or if the condition fails to improve as anticipated.  I provided 32 minutes of non-face-to-face time during this encounter.   Comer LULLA Rouleau, NP

## 2023-11-04 NOTE — Assessment & Plan Note (Signed)
 Stable on current regimen. Cough appears to be upper airway in nature. Action plan in place.

## 2023-11-15 NOTE — Progress Notes (Signed)
Name: Alison Brooks DOB: 05-Nov-1955 MRN: [id number]  History of Present Illness: Alison Brooks is a 68 y.o. female who presents today as a new patient at [facility] [place]. All available relevant medical records have been reviewed. She is a retired Engineer, civil (consulting). - GU History: 1. Interstitial cystitis. Had prior cystoscopy with hydrodistention in May 2007. 2. OAB with urinary frequency, nocturia, urgency, and urge incontinence. 3. Kidney stone(s). - 04/02/2016: Underwent right ureteroscopic stone manipulation by Dr. Vernie Brooks.  Recent history:  > 04/21/2023:  - Urine microscopy: 11-20 WBC/hpf, 3-6 RBC/hpf, many bacteria. No urine culture.  - Normal renal function (creatinine 0.71, GFR 86.49). - CBC unremarkable.  > 10/28/2023:  - Urine microscopy: 3-6 WBC/hpf, 3-6 RBC/hpf, few bacteria. - Negative urine culture.  - Normal renal function (creatinine 0.71, GFR 86.49). - CBC unremarkable.  > No recent relevant imaging.  Today: She reports chief complaint of microscopic hematuria.  She reports "I haven't had a bought of bladder pain in years." Takes OTC antihistamines as needed for seasonal flares ups. Denies any known dietary triggers. Hasn't needed to do at home bladder instillations or other routine IC-directed therapies for many years.   Denies any recent stone episodes / flank pain / abdominal pain.   She reports decreased urinary frequency - reports drinking plenty of water / coffee daily but only urinating approximately 3 times per day. Reports urinary urgency, intermittent painful bladder spasms, occasional urge incontinence, and occasional stress urinary incontinence. Rare dysuria. Denies nocturia, gross hematuria, hesitancy, straining to void, or sensations of incomplete emptying.  She denies history of recent or recurrent UTI. She denies history of GU malignancy or pelvic radiation. 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  She denies  history of autoimmune disease. She reports history of smoking (quit 2009; smoked 1.2 ppd x40 years). She denies known occupational risks. She denies taking anticoagulants.   Fall Screening: Do you usually have a device to assist in your mobility? No   Medications: Current Outpatient Medications  Medication Sig Dispense Refill   acetaminophen (TYLENOL) 500 MG tablet Take 2 tablets (1,000 mg total) by mouth every 6 (six) hours as needed for moderate pain or mild pain. 60 tablet 0   albuterol (VENTOLIN HFA) 108 (90 Base) MCG/ACT inhaler Inhale 2 puffs into the lungs every 6 (six) hours as needed. 18 g 5   allopurinol (ZYLOPRIM) 100 MG tablet TAKE 1 TABLET BY MOUTH DAILY 100 tablet 2   amLODipine (NORVASC) 2.5 MG tablet TAKE 1 TABLET BY MOUTH  DAILY 90 tablet 3   baclofen (LIORESAL) 10 MG tablet Take 10 mg by mouth 3 (three) times daily. PRN     Cholecalciferol (VITAMIN D) 50 MCG (2000 UT) tablet Take 2,000 Units by mouth daily.     cyanocobalamin (VITAMIN B12) 1000 MCG/ML injection 1000 mcg (1 mg) injection once per per month or as directed 30 mL 1   diclofenac sodium (VOLTAREN) 1 % GEL Apply 4 g topically 4 (four) times daily. (Patient taking differently: Apply 4 g topically 4 (four) times daily as needed (pain).) 100 g 1   ezetimibe (ZETIA) 10 MG tablet TAKE 1 TABLET BY MOUTH DAILY 100 tablet 2   famotidine (PEPCID) 20 MG tablet Take 1 tablet (20 mg total) by mouth at bedtime. OFFICE VISIT FOR FURTHER REFILLS 90 tablet 1   fluticasone (FLONASE) 50 MCG/ACT nasal spray Place 2 sprays into both nostrils daily. 18.2 mL 2   levothyroxine (SYNTHROID, LEVOTHROID) 50  MCG tablet Take 50 mcg by mouth daily before breakfast.     pantoprazole (PROTONIX) 40 MG tablet TAKE 1 TABLET BY MOUTH  TWICE DAILY 30 TO 60  MINUTES BEFORE MEALS OFFICE VISIT FOR FURTHER REFILLS!!!!! 90 tablet 1   rosuvastatin (CRESTOR) 40 MG tablet TAKE 1 TABLET BY MOUTH DAILY 100 tablet 2   sertraline (ZOLOFT) 100 MG tablet Take 1  tablet (100 mg total) by mouth daily. 90 tablet 3   Tiotropium Bromide Monohydrate (SPIRIVA RESPIMAT) 2.5 MCG/ACT AERS INHALE 2 PUFFS INTO THE LUNGS ONCE DAILY. 4 g 11   topiramate (TOPAMAX) 200 MG tablet TAKE 1 TABLET BY MOUTH  DAILY 90 tablet 3   No current facility-administered medications for this visit.    Allergies: Allergies  Allergen Reactions   Latex Other (See Comments)    blisters   Zofran [Ondansetron Hcl] Nausea And Vomiting and Other (See Comments)    headache    Past Medical History:  Diagnosis Date   Anxiety    Arthritis    back, neck   Chronic back pain    COPD (chronic obstructive pulmonary disease) (HCC) 2021   after Covid   Depression    Dyspnea    Since covid , NOW ON INHALERS   Fatty liver    Fibromyalgia    GERD (gastroesophageal reflux disease)    Heart murmur    Hematuria    History of adenomatous polyp of colon    History of COVID-19 10/2020   History of kidney stones    History of seizure    x1 2007 post op (per pt neurologist work-up done ?mixture of anesthesia medication and prozac that pt had taken)  and has not any issues since    History of squamous cell carcinoma excision    Hyperlipidemia    Hypertension    Hypothyroidism    IC (interstitial cystitis)    Long COVID    Migraine    Osteoporosis    Pulmonary nodules    multiple per ct -- monitored by pcp   Right ureteral stone    RLS (restless legs syndrome)    Past Surgical History:  Procedure Laterality Date   CARDIOVASCULAR STRESS TEST  09/02/2004   normal perfusion study/  normal LV function and wall motion, ef 64%   CARPAL TUNNEL RELEASE Right 1998   CERVICAL FUSION  1992   CHOLECYSTECTOMY  1995   COLONOSCOPY     CYSTO/  BOTOX INJECTION  12-27-2008  &  04-01-2006   CYSTO/  HYDRODISTENTION/  INSTILLATION THERAPY  03/04/2006   CYSTOSCOPY W/ URETERAL STENT PLACEMENT Right 04/02/2016   Procedure: CYSTOSCOPY WITH RIGHT RETROGRADE, URETEROSCOPY PYELOGRAM LASER  LITHOTRIPSY,/URETERAL STENT PLACEMENT;  Surgeon: Alison Gully, MD;  Location: [facility];  Service: Urology;  Laterality: Right;   HEMORRHOID SURGERY  01/03/2004   HOLMIUM LASER APPLICATION Right 04/02/2016   Procedure: HOLMIUM LASER APPLICATION;  Surgeon: Alison Gully, MD;  Location: [facility];  Service: Urology;  Laterality: Right;   LAPAROSCOPIC RIGHT HEMI COLECTOMY N/A 06/12/2021   Procedure: LAPAROSCOPIC RIGHT HEMI COLECTOMY;  Surgeon: Andria Meuse, MD;  Location: [facility];  Service: General;  Laterality: N/A;   LUMBAR FUSION  1996   L4 --S1   ROTATOR CUFF REPAIR Left 2004   SHOULDER ARTHROSCOPY WITH BICEPS TENDON REPAIR Left 12/2015   SPINAL CORD STIMULATOR IMPLANT  x4  last one 2013   STONE EXTRACTION WITH BASKET Right 04/02/2016   Procedure: STONE EXTRACTION WITH BASKET;  Surgeon: Alison Gully, MD;  Location: [facility];  Service: Urology;  Laterality: Right;   TONSILLECTOMY  1964   TOTAL HIP ARTHROPLASTY Right 05/18/2022   Procedure: TOTAL HIP ARTHROPLASTY ANTERIOR APPROACH;  Surgeon: Sheral Apley, MD;  Location: [facility];  Service: Orthopedics;  Laterality: Right;   TRANSTHORACIC ECHOCARDIOGRAM  04/17/2014   EF 60-65%/  trivial AR, MR, and TR   UPPER GASTROINTESTINAL ENDOSCOPY     VAGINAL HYSTERECTOMY  1984   WRIST GANGLION EXCISION Right 02/05/2004   Family History  Problem Relation Age of Onset   Heart attack Mother        Mom 33, Dad 16   Hypertension Mother    Heart disease Mother    Heart disease Father    Diabetes Father    Colon cancer Maternal Grandmother        not sure age of onset   Esophageal cancer Neg Hx    Rectal cancer Neg Hx    Stomach cancer Neg Hx    Social History   Socioeconomic History   Marital status: Married    Spouse name: Hessie Diener   Number of children: 1   Years of education: [employer] education level: Associate degree: occupational, Scientist, product/process development, or vocational program   Occupational History   Occupation: Retired Engineer, civil (consulting)  Tobacco Use   Smoking status: Former    Current packs/day: 0.00    Average packs/day: 1.3 packs/day for 40.0 years (50.0 ttl pk-yrs)    Types: Cigarettes    Start date: 06/07/1968    Quit date: 06/07/2008    Years since quitting: 15.4   Smokeless tobacco: Never  Vaping Use   Vaping status: Never Used  Substance and Sexual Activity   Alcohol use: Yes    Alcohol/week: 0.0 standard drinks of alcohol    Comment: VERY RARE   Drug use: No   Sexual activity: Not on file  Other Topics Concern   Not on file  Social History Narrative   Lives with spouse, Hessie Diener.    Caffeine use: 1-2 cups coffee per day   Married 47 years in December 2020. 1 daughter. 2 grandkids (boy and girl). Lives 10 miles away.       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       Hobbies: previously enjoyed dancing, old car shoes (50 chevy), reading   Social Drivers of Corporate investment banker Strain: Low Risk  (10/24/2023)   Overall Financial Resource Strain (CARDIA)    Difficulty of Paying Living Expenses: Not hard at all  Food Insecurity: No Food Insecurity (10/24/2023)   Hunger Vital Sign    Worried About Running Out of Food in the Last Year: Never true    Ran Out of Food in the Last Year: Never true  Transportation Needs: No Transportation Needs (10/24/2023)   PRAPARE - Administrator, Civil Service (Medical): No    Lack of Transportation (Non-Medical): No  Physical Activity: Inactive (10/24/2023)   Exercise Vital Sign    Days of Exercise per Week: 0 days    Minutes of Exercise per Session: 10 min  Stress: Stress Concern Present (10/24/2023)   [employer] of Occupational Health - Occupational Stress Questionnaire    Feeling of Stress : To some extent  Social Connections: Moderately Integrated (10/24/2023)   Social Connection and Isolation Panel [NHANES]    Frequency of Communication with Friends and Family: Three times a week     Frequency of Social  Gatherings with Friends and Family: Once a week    Attends Religious Services: 1 to 4 times per year    Active Member of [employer] or Organizations: No    Attends Banker Meetings: Never    Marital Status: Married  Catering manager Violence: Not At Risk (07/26/2023)   Humiliation, Afraid, Rape, and Kick questionnaire    Fear of Current or Ex-Partner: No    Emotionally Abused: No    Physically Abused: No    Sexually Abused: No    SUBJECTIVE  Review of Systems Constitutional: Patient denies any unintentional weight loss or change in strength lntegumentary: Patient denies any rashes or pruritus Cardiovascular: Patient denies chest pain or syncope Respiratory: Patient denies shortness of breath Gastrointestinal: Patient reports chronic diarrhea since her right hemicolectomy Musculoskeletal: Patient denies muscle cramps or weakness Neurologic: Patient denies convulsions or seizures Allergic/Immunologic: Patient denies recent allergic reaction(s) Hematologic/Lymphatic: Patient denies bleeding tendencies Endocrine: Patient denies heat/cold intolerance  GU: As per HPI.  OBJECTIVE Vitals:   11/22/23 0825  BP: 120/74  Pulse: 82  Temp: 98.4 F (36.9 C)   There is no height or weight on file to calculate BMI.  Physical Examination Constitutional: No obvious distress; patient is non-toxic appearing  Cardiovascular: No visible lower extremity edema.  Respiratory: The patient does not have audible wheezing/stridor; respirations do not appear labored  Gastrointestinal: Abdomen non-distended Musculoskeletal: Normal ROM of UEs  Skin: No obvious rashes/open sores  Neurologic: CN 2-12 grossly intact Psychiatric: Answered questions appropriately with normal affect  Hematologic/Lymphatic/Immunologic: No obvious bruises or sites of spontaneous bleeding  Urine microscopy (voided specimen): 6-10 WBC/hpf, 3-10 RBC/hpf, many bacteria PVR: 1  ml  ASSESSMENT Microscopic hematuria - Plan: Urinalysis, Routine w reflex microscopic, BLADDER SCAN AMB NON-IMAGING, CT HEMATURIA WORKUP, Urine Culture  Chronic interstitial cystitis - Plan: Urinalysis, Routine w reflex microscopic, BLADDER SCAN AMB NON-IMAGING, CT HEMATURIA WORKUP  NEPHROLITHIASIS - Plan: Urinalysis, Routine w reflex microscopic, BLADDER SCAN AMB NON-IMAGING, CT HEMATURIA WORKUP  Former smoker - Plan: CT HEMATURIA WORKUP  S/P right hemicolectomy - Plan: CT HEMATURIA WORKUP  Benign neoplasm of colon, unspecified part of colon - Plan: CT HEMATURIA WORKUP  For asymptomatic microscopic hematuria we discussed possible etiologies including but not limited to: vigorous exercise, sexual activity, stone, trauma, blood thinner use, urinary tract infection, bladder irritation related to her interstitial cystitis, urethral irritation secondary to vaginal atrophy, chronic kidney disease, glomerulonephropathy, malignancy. We discussed pt's nicotine use as a risk factor for GU cancer and encouraged continued cessation.  We reviewed the AUA 2020 [facility] guideline and risk stratification for this patient. Based on individual risk factors, pt was advised that the recommended workup includes urine culture (obtained via I&O cath), CT hematuria protocol, and cystoscopy. Pt decided to pursue this work-up and follow-up afterward to discuss the results and formulate a treatment plan based on the findings. All questions were answered.  PLAN Advised the following: Urine culture. CT hematuria protocol. Return for 1st available cystoscopy with any urology MD. Requesting prior records from [facility].  Orders Placed This Encounter  Procedures   Urine Culture   CT HEMATURIA WORKUP    Standing Status:   Future    Expiration Date:   11/21/2024    Reason for Exam (SYMPTOM  OR DIAGNOSIS REQUIRED):   Microscopic hematuria    Preferred imaging location?:   [facility]   Urinalysis, Routine w  reflex microscopic   BLADDER SCAN AMB NON-IMAGING    It has been explained  that the patient is to follow regularly with their PCP in addition to all other providers involved in their care and to follow instructions provided by these respective offices. Patient advised to contact urology clinic if any urologic-pertaining questions, concerns, new symptoms or problems arise in the interim period.  There are no Patient Instructions on file for this visit.  Electronically signed by:  Donnita Falls, MSN, FNP-C, CUNP 11/22/2023 9:33 AM

## 2023-11-17 ENCOUNTER — Telehealth: Payer: Self-pay | Admitting: Nurse Practitioner

## 2023-11-17 DIAGNOSIS — G4733 Obstructive sleep apnea (adult) (pediatric): Secondary | ICD-10-CM

## 2023-11-17 NOTE — Telephone Encounter (Signed)
[facility] states needs order, sleep study results, and office notes for CPAP supplies. [facility] phone number is [phone]. Fax number is [phone].

## 2023-11-21 ENCOUNTER — Ambulatory Visit: Payer: Medicare Other | Admitting: Urology

## 2023-11-22 ENCOUNTER — Ambulatory Visit: Payer: Medicare Other | Admitting: Urology

## 2023-11-22 ENCOUNTER — Encounter: Payer: Self-pay | Admitting: Urology

## 2023-11-22 VITALS — BP 120/74 | HR 82 | Temp 98.4°F

## 2023-11-22 DIAGNOSIS — N3010 Interstitial cystitis (chronic) without hematuria: Secondary | ICD-10-CM

## 2023-11-22 DIAGNOSIS — N200 Calculus of kidney: Secondary | ICD-10-CM | POA: Diagnosis not present

## 2023-11-22 DIAGNOSIS — Z87891 Personal history of nicotine dependence: Secondary | ICD-10-CM | POA: Diagnosis not present

## 2023-11-22 DIAGNOSIS — R3129 Other microscopic hematuria: Secondary | ICD-10-CM

## 2023-11-22 DIAGNOSIS — Z9049 Acquired absence of other specified parts of digestive tract: Secondary | ICD-10-CM

## 2023-11-22 DIAGNOSIS — Z87442 Personal history of urinary calculi: Secondary | ICD-10-CM | POA: Diagnosis not present

## 2023-11-22 DIAGNOSIS — D126 Benign neoplasm of colon, unspecified: Secondary | ICD-10-CM

## 2023-11-22 LAB — URINALYSIS, ROUTINE W REFLEX MICROSCOPIC
Bilirubin, UA: NEGATIVE
Glucose, UA: NEGATIVE
Ketones, UA: NEGATIVE
Nitrite, UA: NEGATIVE
Specific Gravity, UA: 1.03 (ref 1.005–1.030)
Urobilinogen, Ur: 1 mg/dL (ref 0.2–1.0)
pH, UA: 6 (ref 5.0–7.5)

## 2023-11-22 LAB — MICROSCOPIC EXAMINATION: Epithelial Cells (non renal): >10 /[HPF] — AB (ref 0–10)

## 2023-11-22 LAB — BLADDER SCAN AMB NON-IMAGING: Scan Result: 1

## 2023-11-23 ENCOUNTER — Other Ambulatory Visit: Payer: Self-pay

## 2023-11-23 MED ORDER — TOPIRAMATE 200 MG PO TABS: 200.0000 mg | ORAL_TABLET | Freq: Every day | ORAL | 3 refills | Status: DC

## 2023-11-23 NOTE — Telephone Encounter (Signed)
I have placed order for CPAP supplies with [facility] Asked that [facility] fax other documentation that they have requested

## 2023-11-24 ENCOUNTER — Telehealth: Payer: Self-pay

## 2023-11-24 LAB — URINE CULTURE: Organism ID, Bacteria: NO GROWTH

## 2023-11-24 NOTE — Telephone Encounter (Signed)
-----   Message from Donnita Falls sent at 11/24/2023  8:38 AM EST ----- Please notify patient: Negative urine culture, no antibiotic needed at this time. Advised to follow up with CT & cystoscopy as planned. Thanks.

## 2023-11-24 NOTE — Telephone Encounter (Signed)
Patient made aware and voiced understanding.

## 2023-12-02 ENCOUNTER — Encounter: Payer: Self-pay | Admitting: Nurse Practitioner

## 2023-12-02 ENCOUNTER — Telehealth (INDEPENDENT_AMBULATORY_CARE_PROVIDER_SITE_OTHER): Payer: Medicare Other | Admitting: Nurse Practitioner

## 2023-12-02 DIAGNOSIS — J449 Chronic obstructive pulmonary disease, unspecified: Secondary | ICD-10-CM | POA: Diagnosis not present

## 2023-12-02 DIAGNOSIS — G4733 Obstructive sleep apnea (adult) (pediatric): Secondary | ICD-10-CM

## 2023-12-02 NOTE — Assessment & Plan Note (Signed)
 Mild OSA on CPAP. Receives benefit from use but unfortunately having difficulties with nocturnal cough. Possibly due to mouth breathing and postnasal drainage causing upper airway irritation. She does feel current pressure settings are better. We will change her mask to a hybrid full face mask. Advised her to adjust her humidity settings as well. Continue intranasal steroid. If she continues to have difficulties, we could look at an oral appliance as alternative therapy option. Aware of risks of untreated mild OSA and potential treatment options. Safe driving practices reviewed.

## 2023-12-02 NOTE — Assessment & Plan Note (Signed)
 Mild COPD. Compensated on current regimen. Infrequent SABA use. Avoid known triggers. Action plan in place.

## 2023-12-02 NOTE — Progress Notes (Signed)
 Patient ID: Alison Brooks, female     DOB: November 23, 1955, 68 y.o.      MRN: [id number]  No chief complaint on file.   Virtual Visit via Video Note  I connected with Alison Brooks on 12/02/23 at  2:30 PM EST by a video enabled telemedicine application and verified that I am speaking with the correct person using two identifiers.  Location: Patient: Home Provider: Office   I discussed the limitations of evaluation and management by telemedicine and the availability of in person appointments. The patient expressed understanding and agreed to proceed.  History of Present Illness: 68 year old female, former smoker followed for OSA and COPD. Past medical history significant for migraines, HTN, aortic atherosclerosis, multiple pulmonary nodules, COPD, GERD, hypothyroid, chronic cystitis, HLD, RLS, hx of hemicolectomy. She is followed by the lung cancer screening program as well.   TEST/EVENTS:  04/11/2023 HST: AHI 7.7/h (4%), 22.8/h (3%), SpO2 low 88%  03/29/2023: OV with Dr. Neysa for sleep consult.  Retired engineer, civil (consulting).  Snores pretty routinely.  Sometimes wakes with a choke or cough.  Husband describes witnessed apneas.  Admits tired routinely and says she could sleep 24 hours a day.  No sleep medicines.  Drinks 3 cups of coffee.  Has had ENT surgery for tonsils and repair of nasal fracture in the past.  Usual bedtime between midnight and 1 AM with short sleep latency.  Waking 3-4 times a night.  Some restless leg but no complex parasomnias.  Frequent headaches.  Home sleep study ordered for further evaluation.  08/26/2023: OV with Kalynn Declercq NP The patient, with a history of snoring and chronic fatigue, was suspected to have sleep apnea. She reports feeling tired all the time, with her spouse noting episodes of apnea during sleep. The patient has undergone a home sleep study, which revealed mild sleep apnea. Despite the classification, the patient experiences significant daytime symptoms, including a  persistent sense of tiredness and a propensity to sleep during the day. She reports that she could sleep for 24 hours a day, indicating a state of chronic fatigue. The patient also mentions that she already sleeps on her side, a positional adjustment often recommended for mild sleep apnea, but this has not alleviated her symptoms. She denies drowsy driving, morning headaches, sleep parasomnias/paralysis. She does not take any sleep aids. Breathing is stable. No issues with her COPD. No significant cough or wheezing. Uses Spiriva  and infrequent PRN SABA.   11/04/2023: OV with Kandon Hosking NP for follow-up via virtual visit.  Since her last visit, she started on CPAP therapy.  She has been having difficulties adjusting to this.  She feels like she tries to get to the 4-hour mark most nights and then usually takes it off.  It is caused her to have increased nasal congestion and a cough at night.  Drainage is clear.  She denies any fevers, facial tenderness, purulent sputum production.  Does not really have any issues with her cough during the day.  Breathing feels like it is at her baseline.  Has not really noticed if she is sleeping better or feeling better rested with the CPAP due to the issues she is having with it at night.  She is currently wearing a nasal mask.  No issues with drowsy driving, morning headaches or sleep parasomnia/paralysis. 10/04/2023-11/02/2023: CPAP 5-15 cmH2O 28/30 days; 60% >4 hr; average use 4 hr 5 min Pressure 95th 9.2 Leaks 95th 17.6 AHI 1.9  12/02/2023: Today - follow up Patient  presents today for follow-up via virtual visit.  At her last visit, we adjusted her CPAP settings to 5 to 9 cm of water.  She does feel like this is working better for her.  She is still having trouble with cough at night.  Does feel like the Flonase  has helped with some of her nasal congestion.  She did not wear her CPAP for couple nights and did not have any issues with the cough.  She has not made any  adjustments to her humidity settings.  She is currently wearing a nasal mask.  Not sure if she is mouth breathing at night.  Does feel like her stretches of sleep are more restful when she has the CPAP on.  She just feels like she wants to take it off after she reaches her 4-hour threshold due to the cough.  Denies any issues with drowsy driving or morning headaches. Breathing has been doing well.  No issues with wheezing or chest congestion.  Minimal Saba use  11/01/2023-11/30/2023: CPAP 5-9 cmH2O 26/30 days; 73% >4 hr; average use 4 hr 49 min Pressure 95th 8.5 Leaks 95th 17.6 AHI 1.2  Allergies  Allergen Reactions   Latex Other (See Comments)    blisters   Zofran  [Ondansetron  Hcl] Nausea And Vomiting and Other (See Comments)    headache   Immunization History  Administered Date(s) Administered   Fluad Quad(high Dose 65+) 07/17/2019, 10/02/2021   Influenza Whole 08/11/1999, 08/11/2007, 07/25/2009   Influenza,inj,Quad PF,6+ Mos 09/03/2013, 07/12/2014, 08/04/2015   Influenza-Unspecified 07/25/2017   Moderna Sars-Covid-2 Vaccination 11/17/2019, 12/17/2019, 08/28/2020   PNEUMOCOCCAL CONJUGATE-20 10/02/2021   Pneumococcal Polysaccharide-23 07/17/2019   Td 10/25/1998, 10/25/2006   Tdap 04/20/2018   Past Medical History:  Diagnosis Date   Anxiety    Arthritis    back, neck   Chronic back pain    COPD (chronic obstructive pulmonary disease) (HCC) 2021   after Covid   Depression    Dyspnea    Since covid , NOW ON INHALERS   Fatty liver    Fibromyalgia    GERD (gastroesophageal reflux disease)    Heart murmur    Hematuria    History of adenomatous polyp of colon    History of COVID-19 10/2020   History of kidney stones    History of seizure    x1 2007 post op (per pt neurologist work-up done ?mixture of anesthesia medication and prozac  that pt had taken)  and has not any issues since    History of squamous cell carcinoma excision    Hyperlipidemia    Hypertension     Hypothyroidism    IC (interstitial cystitis)    Long COVID    Migraine    Osteoporosis    Pulmonary nodules    multiple per ct -- monitored by pcp   Right ureteral stone    RLS (restless legs syndrome)     Tobacco History: Social History   Tobacco Use  Smoking Status Former   Current packs/day: 0.00   Average packs/day: 1.3 packs/day for 40.0 years (50.0 ttl pk-yrs)   Types: Cigarettes   Start date: 06/07/1968   Quit date: 06/07/2008   Years since quitting: 15.4  Smokeless Tobacco Never   Counseling given: Not Answered   Outpatient Medications Prior to Visit  Medication Sig Dispense Refill   acetaminophen  (TYLENOL ) 500 MG tablet Take 2 tablets (1,000 mg total) by mouth every 6 (six) hours as needed for moderate pain or mild pain. 60 tablet 0  albuterol  (VENTOLIN  HFA) 108 (90 Base) MCG/ACT inhaler Inhale 2 puffs into the lungs every 6 (six) hours as needed. 18 g 5   allopurinol  (ZYLOPRIM ) 100 MG tablet TAKE 1 TABLET BY MOUTH DAILY 100 tablet 2   amLODipine  (NORVASC ) 2.5 MG tablet TAKE 1 TABLET BY MOUTH  DAILY 90 tablet 3   baclofen  (LIORESAL ) 10 MG tablet Take 10 mg by mouth 3 (three) times daily. PRN     Cholecalciferol (VITAMIN D) 50 MCG (2000 UT) tablet Take 2,000 Units by mouth daily.     cyanocobalamin  (VITAMIN B12) 1000 MCG/ML injection 1000 mcg (1 mg) injection once per per month or as directed 30 mL 1   diclofenac  sodium (VOLTAREN ) 1 % GEL Apply 4 g topically 4 (four) times daily. (Patient taking differently: Apply 4 g topically 4 (four) times daily as needed (pain).) 100 g 1   ezetimibe  (ZETIA ) 10 MG tablet TAKE 1 TABLET BY MOUTH DAILY 100 tablet 2   famotidine  (PEPCID ) 20 MG tablet Take 1 tablet (20 mg total) by mouth at bedtime. OFFICE VISIT FOR FURTHER REFILLS 90 tablet 1   fluticasone  (FLONASE ) 50 MCG/ACT nasal spray Place 2 sprays into both nostrils daily. 18.2 mL 2   levothyroxine  (SYNTHROID , LEVOTHROID) 50 MCG tablet Take 50 mcg by mouth daily before breakfast.      pantoprazole  (PROTONIX ) 40 MG tablet TAKE 1 TABLET BY MOUTH  TWICE DAILY 30 TO 60  MINUTES BEFORE MEALS OFFICE VISIT FOR FURTHER REFILLS!!!!! 90 tablet 1   rosuvastatin  (CRESTOR ) 40 MG tablet TAKE 1 TABLET BY MOUTH DAILY 100 tablet 2   sertraline  (ZOLOFT ) 100 MG tablet Take 1 tablet (100 mg total) by mouth daily. 90 tablet 3   Tiotropium Bromide  Monohydrate (SPIRIVA  RESPIMAT) 2.5 MCG/ACT AERS INHALE 2 PUFFS INTO THE LUNGS ONCE DAILY. 4 g 11   topiramate  (TOPAMAX ) 200 MG tablet Take 1 tablet (200 mg total) by mouth daily. 90 tablet 3   No facility-administered medications prior to visit.     Review of Systems:   Constitutional: No weight loss or gain, night sweats, fevers, chills, or lassitude. +Fatigue  HEENT: No headaches, difficulty swallowing, tooth/dental problems, or sore throat. No sneezing, itching, ear ache +nasal congestion, post nasal drip CV:  No chest pain, orthopnea, PND, swelling in lower extremities, anasarca, dizziness, palpitations, syncope Resp: +snoring (without CPAP); cough. No shortness of breath with exertion or at rest. No excess mucus or change in color of mucus. No hemoptysis. No wheezing.  No chest wall deformity GI:  No heartburn, indigestion, abdominal pain, nausea, vomiting, diarrhea, change in bowel habits, loss of appetite, bloody stools.  GU: No nocturia  Skin: No rash, lesions, ulcerations MSK:  No joint pain or swelling.   Neuro: No dizziness or lightheadedness.  Psych: No depression or anxiety. Mood stable.   Observations/Objective: Patient is well-developed, well-nourished in no acute distress. A&Ox3. Resting comfortably at home. Unlabored breathing. Speech is clear and coherent with logical content.    Assessment and Plan: Mild obstructive sleep apnea Mild OSA on CPAP. Receives benefit from use but unfortunately having difficulties with nocturnal cough. Possibly due to mouth breathing and postnasal drainage causing upper airway irritation. She does  feel current pressure settings are better. We will change her mask to a hybrid full face mask. Advised her to adjust her humidity settings as well. Continue intranasal steroid. If she continues to have difficulties, we could look at an oral appliance as alternative therapy option. Aware of risks of untreated mild OSA  and potential treatment options. Safe driving practices reviewed.    COPD (chronic obstructive pulmonary disease) (HCC) Mild COPD. Compensated on current regimen. Infrequent SABA use. Avoid known triggers. Action plan in place.    I discussed the assessment and treatment plan with the patient. The patient was provided an opportunity to ask questions and all were answered. The patient agreed with the plan and demonstrated an understanding of the instructions.   The patient was advised to call back or seek an in-person evaluation if the symptoms worsen or if the condition fails to improve as anticipated.  I provided 32 minutes of non-face-to-face time during this encounter.   Comer LULLA Rouleau, NP

## 2023-12-02 NOTE — Patient Instructions (Addendum)
 Continue to use CPAP every night, minimum of 4-6 hours a night.  Change equipment as directed. Wash your tubing with warm soap and water daily, hang to dry. Wash humidifier portion weekly. Use bottled, distilled water and change daily Be aware of reduced alertness and do not drive or operate heavy machinery if experiencing this or drowsiness.  Exercise encouraged, as tolerated. Healthy weight management discussed.  Avoid or decrease alcohol consumption and medications that make you more sleepy, if possible. Notify if persistent daytime sleepiness occurs even with consistent use of PAP therapy.  Continue flonase  nasal spray   Change mask to hybrid full face mask - DreamWear full face, Evora full face, AirFit F40 or similar   Contact medical supply company if you haven't heard about the new mask next week  Continue Spiriva  2 puffs daily Continue Albuterol  inhaler 2 puffs every 6 hours as needed for shortness of breath or wheezing. Notify if symptoms persist despite rescue inhaler/neb use.   Follow up in 6 weeks with Dr. Neysa or Izetta Zariah Cavendish,NP to see CPAP is going, or sooner, if needed

## 2023-12-03 ENCOUNTER — Other Ambulatory Visit: Payer: Self-pay | Admitting: Internal Medicine

## 2023-12-03 DIAGNOSIS — K219 Gastro-esophageal reflux disease without esophagitis: Secondary | ICD-10-CM

## 2023-12-07 ENCOUNTER — Ambulatory Visit (HOSPITAL_COMMUNITY)
Admission: RE | Admit: 2023-12-07 | Discharge: 2023-12-07 | Disposition: A | Discharge status: Home / Self Care | Payer: Medicare Other | Source: Ambulatory Visit | Attending: Urology | Admitting: Urology

## 2023-12-07 DIAGNOSIS — Z87891 Personal history of nicotine dependence: Secondary | ICD-10-CM | POA: Diagnosis not present

## 2023-12-07 DIAGNOSIS — N3010 Interstitial cystitis (chronic) without hematuria: Secondary | ICD-10-CM | POA: Diagnosis not present

## 2023-12-07 DIAGNOSIS — D126 Benign neoplasm of colon, unspecified: Secondary | ICD-10-CM | POA: Insufficient documentation

## 2023-12-07 DIAGNOSIS — K5730 Diverticulosis of large intestine without perforation or abscess without bleeding: Secondary | ICD-10-CM | POA: Diagnosis not present

## 2023-12-07 DIAGNOSIS — Z9049 Acquired absence of other specified parts of digestive tract: Secondary | ICD-10-CM | POA: Insufficient documentation

## 2023-12-07 DIAGNOSIS — R3129 Other microscopic hematuria: Secondary | ICD-10-CM | POA: Insufficient documentation

## 2023-12-07 DIAGNOSIS — N200 Calculus of kidney: Secondary | ICD-10-CM | POA: Diagnosis not present

## 2023-12-07 MED ORDER — IOHEXOL 300 MG/ML  SOLN
125.0000 mL | Freq: Once | INTRAMUSCULAR | Status: AC | PRN
  Administered 2023-12-07: 125 mL via INTRAVENOUS

## 2023-12-08 ENCOUNTER — Ambulatory Visit: Payer: Medicare Other | Admitting: Urology

## 2023-12-08 VITALS — BP 113/55 | HR 81

## 2023-12-08 DIAGNOSIS — R3129 Other microscopic hematuria: Secondary | ICD-10-CM | POA: Diagnosis not present

## 2023-12-08 LAB — URINALYSIS, ROUTINE W REFLEX MICROSCOPIC
Bilirubin, UA: NEGATIVE
Glucose, UA: NEGATIVE
Ketones, UA: NEGATIVE
Nitrite, UA: NEGATIVE
Protein,UA: NEGATIVE
Specific Gravity, UA: 1.03 (ref 1.005–1.030)
Urobilinogen, Ur: 0.2 mg/dL (ref 0.2–1.0)
pH, UA: 6 (ref 5.0–7.5)

## 2023-12-08 LAB — MICROSCOPIC EXAMINATION: Epithelial Cells (non renal): >10 /[HPF] — AB (ref 0–10)

## 2023-12-08 MED ORDER — CIPROFLOXACIN HCL 500 MG PO TABS
500.0000 mg | ORAL_TABLET | Freq: Once | ORAL | Status: AC
Start: 2023-12-08 — End: 2023-12-08
  Administered 2023-12-08: 500 mg via ORAL

## 2023-12-08 NOTE — Progress Notes (Unsigned)
Subjective:  1. Microscopic hematuria     12/08/23: Alison Brooks returns today for cystoscopy to complete her hematuria w/u.  The CT has not been read but on my review there is a tiny LLP stone without obstruction and no other obvious GU findings.  The bladder is not well visualized because of beam hardening from her right THA.   She has no significant voiding symptoms.  Her UA has persistent pyuria with 3-10 RBC's, >10 epis and many bacteria but her culture for a similar UA on 1/28 was negative.     11/22/23: Alison Brooks is a 68 y.o. female who presents today as a new patient at [facility]. All available relevant medical records have been reviewed. She is a retired Engineer, civil (consulting). - GU History: 1. Interstitial cystitis. Had prior cystoscopy with hydrodistention in May 2007. 2. OAB with urinary frequency, nocturia, urgency, and urge incontinence. 3. Kidney stone(s). - 04/02/2016: Underwent right ureteroscopic stone manipulation by Dr. Vernie Ammons.  Recent history:  > 04/21/2023:  - Urine microscopy: 11-20 WBC/hpf, 3-6 RBC/hpf, many bacteria. No urine culture.  - Normal renal function (creatinine 0.71, GFR 86.49). - CBC unremarkable.  > 10/28/2023:  - Urine microscopy: 3-6 WBC/hpf, 3-6 RBC/hpf, few bacteria. - Negative urine culture.  - Normal renal function (creatinine 0.71, GFR 86.49). - CBC unremarkable.  > No recent relevant imaging.      ROS:  ROS:  A complete review of systems was performed.  All systems are negative except for pertinent findings as noted.   ROS  Allergies  Allergen Reactions   Latex Other (See Comments)    blisters   Zofran [Ondansetron Hcl] Nausea And Vomiting and Other (See Comments)    headache    Outpatient Encounter Medications as of 12/08/2023  Medication Sig   acetaminophen (TYLENOL) 500 MG tablet Take 2 tablets (1,000 mg total) by mouth every 6 (six) hours as needed for moderate pain or mild pain.   albuterol (VENTOLIN HFA) 108 (90 Base)  MCG/ACT inhaler Inhale 2 puffs into the lungs every 6 (six) hours as needed.   allopurinol (ZYLOPRIM) 100 MG tablet TAKE 1 TABLET BY MOUTH DAILY   amLODipine (NORVASC) 2.5 MG tablet TAKE 1 TABLET BY MOUTH  DAILY   baclofen (LIORESAL) 10 MG tablet Take 10 mg by mouth 3 (three) times daily. PRN   Cholecalciferol (VITAMIN D) 50 MCG (2000 UT) tablet Take 2,000 Units by mouth daily.   cyanocobalamin (VITAMIN B12) 1000 MCG/ML injection 1000 mcg (1 mg) injection once per per month or as directed   diclofenac sodium (VOLTAREN) 1 % GEL Apply 4 g topically 4 (four) times daily. (Patient taking differently: Apply 4 g topically 4 (four) times daily as needed (pain).)   ezetimibe (ZETIA) 10 MG tablet TAKE 1 TABLET BY MOUTH DAILY   famotidine (PEPCID) 20 MG tablet Take 1 tablet (20 mg total) by mouth at bedtime. OFFICE VISIT FOR FURTHER REFILLS   fluticasone (FLONASE) 50 MCG/ACT nasal spray Place 2 sprays into both nostrils daily.   levothyroxine (SYNTHROID, LEVOTHROID) 50 MCG tablet Take 50 mcg by mouth daily before breakfast.   pantoprazole (PROTONIX) 40 MG tablet TAKE 1 TABLET BY MOUTH  TWICE DAILY 30 TO 60  MINUTES BEFORE MEALS OFFICE VISIT FOR FURTHER REFILLS!!!!!   rosuvastatin (CRESTOR) 40 MG tablet TAKE 1 TABLET BY MOUTH DAILY   Tiotropium Bromide Monohydrate (SPIRIVA RESPIMAT) 2.5 MCG/ACT AERS INHALE 2 PUFFS INTO THE LUNGS ONCE DAILY.   topiramate (TOPAMAX) 200 MG tablet Take 1 tablet (  200 mg total) by mouth daily.   sertraline (ZOLOFT) 100 MG tablet Take 1 tablet (100 mg total) by mouth daily.   [DISCONTINUED] orlistat (XENICAL) 120 MG capsule Take 1 capsule (120 mg total) by mouth 3 (three) times daily with meals.   Facility-Administered Encounter Medications as of 12/08/2023  Medication   ciprofloxacin (CIPRO) tablet 500 mg    Past Medical History:  Diagnosis Date   Anxiety    Arthritis    back, neck   Chronic back pain    COPD (chronic obstructive pulmonary disease) (HCC) 2021   after  Covid   Depression    Dyspnea    Since covid , NOW ON INHALERS   Fatty liver    Fibromyalgia    GERD (gastroesophageal reflux disease)    Heart murmur    Hematuria    History of adenomatous polyp of colon    History of COVID-19 10/2020   History of kidney stones    History of seizure    x1 2007 post op (per pt neurologist work-up done ?mixture of anesthesia medication and prozac that pt had taken)  and has not any issues since    History of squamous cell carcinoma excision    Hyperlipidemia    Hypertension    Hypothyroidism    IC (interstitial cystitis)    Long COVID    Migraine    Osteoporosis    Pulmonary nodules    multiple per ct -- monitored by pcp   Right ureteral stone    RLS (restless legs syndrome)     Past Surgical History:  Procedure Laterality Date   CARDIOVASCULAR STRESS TEST  09/02/2004   normal perfusion study/  normal LV function and wall motion, ef 64%   CARPAL TUNNEL RELEASE Right 1998   CERVICAL FUSION  1992   CHOLECYSTECTOMY  1995   COLONOSCOPY     CYSTO/  BOTOX INJECTION  12-27-2008  &  04-01-2006   CYSTO/  HYDRODISTENTION/  INSTILLATION THERAPY  03/04/2006   CYSTOSCOPY W/ URETERAL STENT PLACEMENT Right 04/02/2016   Procedure: CYSTOSCOPY WITH RIGHT RETROGRADE, URETEROSCOPY PYELOGRAM LASER LITHOTRIPSY,/URETERAL STENT PLACEMENT;  Surgeon: Ihor Gully, MD;  Location: [facility];  Service: Urology;  Laterality: Right;   HEMORRHOID SURGERY  01/03/2004   HOLMIUM LASER APPLICATION Right 04/02/2016   Procedure: HOLMIUM LASER APPLICATION;  Surgeon: Ihor Gully, MD;  Location: [facility];  Service: Urology;  Laterality: Right;   LAPAROSCOPIC RIGHT HEMI COLECTOMY N/A 06/12/2021   Procedure: LAPAROSCOPIC RIGHT HEMI COLECTOMY;  Surgeon: Andria Meuse, MD;  Location: [facility];  Service: General;  Laterality: N/A;   LUMBAR FUSION  1996   L4 --S1   ROTATOR CUFF REPAIR Left 2004   SHOULDER ARTHROSCOPY WITH BICEPS TENDON REPAIR  Left 12/2015   SPINAL CORD STIMULATOR IMPLANT  x4  last one 2013   STONE EXTRACTION WITH BASKET Right 04/02/2016   Procedure: STONE EXTRACTION WITH BASKET;  Surgeon: Ihor Gully, MD;  Location[facility];  Service: Urology;  Laterality: Right;   TONSILLECTOMY  1964   TOTAL HIP ARTHROPLASTY Right 05/18/2022   Procedure: TOTAL HIP ARTHROPLASTY ANTERIOR APPROACH;  Surgeon: Sheral Apley, MD;  Location: [facility];  Service: Orthopedics;  Laterality: Right;   TRANSTHORACIC ECHOCARDIOGRAM  04/17/2014   EF 60-65%/  trivial AR, MR, and TR   UPPER GASTROINTESTINAL ENDOSCOPY     VAGINAL HYSTERECTOMY  1984   WRIST GANGLION EXCISION Right 02/05/2004    Social History  Socioeconomic History   Marital status: Married    Spouse name: Hessie Diener   Number of children: 1   Years of education: [employer] education level: Associate degree: occupational, Scientist, product/process development, or vocational program  Occupational History   Occupation: Retired Engineer, civil (consulting)  Tobacco Use   Smoking status: Former    Current packs/day: 0.00    Average packs/day: 1.3 packs/day for 40.0 years (50.0 ttl pk-yrs)    Types: Cigarettes    Start date: 06/07/1968    Quit date: 06/07/2008    Years since quitting: 15.5   Smokeless tobacco: Never  Vaping Use   Vaping status: Never Used  Substance and Sexual Activity   Alcohol use: Yes    Alcohol/week: 0.0 standard drinks of alcohol    Comment: VERY RARE   Drug use: No   Sexual activity: Not on file  Other Topics Concern   Not on file  Social History Narrative   Lives with spouse, Hessie Diener.    Caffeine use: 1-2 cups coffee per day   Married 47 years in December 2020. 1 daughter. 2 grandkids (boy and girl). Lives 10 miles away.       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       Hobbies: previously enjoyed dancing, old car shoes (48 chevy), reading   Social Drivers of Corporate investment banker Strain: Low Risk  (10/24/2023)   Overall Financial Resource Strain (CARDIA)     Difficulty of Paying Living Expenses: Not hard at all  Food Insecurity: No Food Insecurity (10/24/2023)   Hunger Vital Sign    Worried About Running Out of Food in the Last Year: Never true    Ran Out of Food in the Last Year: Never true  Transportation Needs: No Transportation Needs (10/24/2023)   PRAPARE - Administrator, Civil Service (Medical): No    Lack of Transportation (Non-Medical): No  Physical Activity: Inactive (10/24/2023)   Exercise Vital Sign    Days of Exercise per Week: 0 days    Minutes of Exercise per Session: 10 min  Stress: Stress Concern Present (10/24/2023)   [employer] of Occupational Health - Occupational Stress Questionnaire    Feeling of Stress : To some extent  Social Connections: Moderately Integrated (10/24/2023)   Social Connection and Isolation Panel [NHANES]    Frequency of Communication with Friends and Family: Three times a week    Frequency of Social Gatherings with Friends and Family: Once a week    Attends Religious Services: 1 to 4 times per year    Active Member of [employer] or Organizations: No    Attends Banker Meetings: Never    Marital Status: Married  Catering manager Violence: Not At Risk (07/26/2023)   Humiliation, Afraid, Rape, and Kick questionnaire    Fear of Current or Ex-Partner: No    Emotionally Abused: No    Physically Abused: No    Sexually Abused: No    Family History  Problem Relation Age of Onset   Heart attack Mother        Mom 12, Dad 71   Hypertension Mother    Heart disease Mother    Heart disease Father    Diabetes Father    Colon cancer Maternal Grandmother        not sure age of onset   Esophageal cancer Neg Hx    Rectal cancer Neg Hx    Stomach cancer Neg Hx  Objective: Vitals:   12/08/23 1046  BP: (!) 113/55  Pulse: 81     Physical Exam  Lab Results:  PSA No results found for: "PSA" No results found for: "TESTOSTERONE" UA and culture reviewed.     Studies/Results: No results found. Results for orders placed in visit on 11/14/17  DG Abd 1 View  Narrative CLINICAL DATA:  Right flank pain for 1 month.  EXAM: ABDOMEN - 1 VIEW  COMPARISON:  None.  FINDINGS: The bowel gas pattern is normal. Extensive bowel content is identified throughout colon. No radio-opaque calculi or other significant radiographic abnormality are seen. Spinal stimulator lead is identified.  IMPRESSION: No radiopaque calculi identified in the abdomen. Marked bowel content is identified throughout colon consistent constipation.   Electronically Signed By: Sherian Rein M.D. On: 11/14/2017 12:50  No results found for this or any previous visit.  No results found for this or any previous visit.  No results found for this or any previous visit.  No results found for this or any previous visit.  No results found for this or any previous visit.  No results found for this or any previous visit.  Results for orders placed during the hospital encounter of 02/29/16  CT Renal Stone Study  Narrative CLINICAL DATA:  Acute onset of severe right flank pain.  EXAM: CT ABDOMEN AND PELVIS WITHOUT CONTRAST  TECHNIQUE: Multidetector CT imaging of the abdomen and pelvis was performed following the standard protocol without IV contrast.  COMPARISON:  07/05/2011  FINDINGS: Lower chest: The lung bases are clear of acute process. No pleural effusion. The heart is normal in size. No pericardial effusion. Spinal canal catheter is noted.  Hepatobiliary: No focal hepatic lesions or intrahepatic biliary dilatation. The gallbladder surgically absent. No common bile duct dilatation.  Pancreas: No mass, inflammation or ductal dilatation.  Spleen: Normal size.  No focal lesions.  Adrenals/Urinary Tract: Stable bilateral adrenal gland adenomas.  The left kidney is normal. No renal or obstructing ureteral calculi. No renal lesion.  The right kidney  demonstrates moderate hydronephrosis. This is due to a 4 mm UPJ calculus. The remainder of the ureters normal. No bladder calculi or bladder mass.  Stomach/Bowel: The stomach, duodenum, small bowel and colon are grossly normal without oral contrast. No inflammatory changes, mass lesions or obstructive findings. The terminal ileum is normal. The appendix is normal.  Vascular/Lymphatic: No mesenteric or retroperitoneal mass or adenopathy. Moderate atherosclerotic calcifications involving the aorta.  Reproductive: The uterus is surgically absent. Both ovaries are still present and appear normal.  Other: No pelvic mass, adenopathy or free pelvic fluid collections. No inguinal mass or adenopathy. No abdominal wall hernia. No subcutaneous lesions.  Musculoskeletal: No significant plaque bony findings. Spinal cord stimulator noted in the mid thoracic spine.  IMPRESSION: 1. 4 mm right UPJ calculus with moderate grade obstruction. 2. No renal calculi or bladder calculi. 3. No other significant abdominal/pelvic findings.   Electronically Signed By: Rudie Meyer M.D. On: 02/29/2016 20:46  CT from 12/07/23 reviewed.   Procedure: Cystoscopy  She was prepped with betadine and given cipro 500mg  po.  The urethra was normal.  The bladder wall had mild trabeculation without mucosal lesions.  The UO's were normal.    Assessment & Plan: Microhematuria.   CT showed a tiny LLP stone and the cystoscopy was negative.    Abn UA.  She apppears to have vaginal contaminants in her UA.    Meds ordered this encounter  Medications   ciprofloxacin (  CIPRO) tablet 500 mg     Orders Placed This Encounter  Procedures   Urinalysis, Routine w reflex microscopic   Cystoscopy      Return if symptoms worsen or fail to improve.   CC: Shelva Majestic, MD      Bjorn Pippin 12/08/2023

## 2023-12-09 ENCOUNTER — Encounter: Payer: Self-pay | Admitting: Family Medicine

## 2023-12-09 ENCOUNTER — Ambulatory Visit: Payer: Medicare Other | Admitting: Family Medicine

## 2023-12-09 VITALS — BP 122/68 | HR 76 | Temp 97.7°F | Ht 62.0 in | Wt 141.0 lb

## 2023-12-09 DIAGNOSIS — I10 Essential (primary) hypertension: Secondary | ICD-10-CM

## 2023-12-09 DIAGNOSIS — F3341 Major depressive disorder, recurrent, in partial remission: Secondary | ICD-10-CM | POA: Diagnosis not present

## 2023-12-09 MED ORDER — CITALOPRAM HYDROBROMIDE 20 MG PO TABS
20.0000 mg | ORAL_TABLET | Freq: Every day | ORAL | 3 refills | Status: AC
Start: 2023-12-09 — End: ?

## 2023-12-09 NOTE — Patient Instructions (Addendum)
Trial citalopram 20 mg to see if improve compared to Lexapro but with less side effects than sertraline. Any thoughts of self harm call 911 or seek care immediately.  Slight improvement  Recommended follow up: Return in about 3 months (around 03/07/2024) for followup or sooner if needed.Schedule b4 you leave. UNLESS you are not improving or don't tolerate medicine- keep me updated

## 2023-12-09 NOTE — Progress Notes (Signed)
Phone [phone] In person visit   Subjective:   Alison Brooks is a 68 y.o. 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 04/21/2007    Priority: High   Age-related osteoporosis without current pathological fracture 05/30/2023    Priority: Medium    Hyperglycemia 09/22/2022    Priority: Medium    Aortic atherosclerosis (HCC) 03/31/2021    Priority: Medium    Hypothyroidism 07/17/2019    Priority: Medium    Gout 06/28/2014    Priority: Medium    Vitamin B12 deficiency 06/28/2014    Priority: Medium    Multiple pulmonary nodules 06/28/2014    Priority: Medium    Essential hypertension 05/17/2014    Priority: Medium    RESTLESS LEG SYNDROME, SEVERE 03/14/2009    Priority: Medium    Chronic interstitial cystitis 01/20/2009    Priority: Medium    Depression, major, recurrent (HCC) 03/19/2008    Priority: Medium    Hyperlipidemia 02/17/2008    Priority: Medium    Former smoker 12/21/2007    Priority: Medium    Fibromyalgia 04/21/2007    Priority: Medium    Obesity (BMI 30-39.9) 01/14/2014    Priority: Low   Lateral epicondylitis (tennis elbow) 01/02/2014    Priority: Low   GERD (gastroesophageal reflux disease) 03/26/2013    Priority: Low   LEG CRAMPS 05/16/2009    Priority: Low   TUBULOVILLOUS ADENOMA, COLON 02/17/2008    Priority: Low   NEPHROLITHIASIS 02/17/2008    Priority: Low   MIGRAINE, CLASSICAL W/O INTRACTABLE MIGRAINE 06/09/2007    Priority: Low   SYMPTOM, MEMORY LOSS 06/09/2007    Priority: Low   Rhinitis 11/04/2023   Mild  obstructive sleep apnea 08/26/2023   Snoring 03/29/2023   S/P total right hip arthroplasty 05/18/2022   S/P right hemicolectomy 06/12/2021   History of total hysterectomy 07/17/2019   Vertigo 11/06/2016   Dyspnea 11/02/2014   Upper airway cough syndrome 11/02/2014    Medications- reviewed and updated Current Outpatient Medications  Medication Sig Dispense Refill   acetaminophen (TYLENOL) 500 MG tablet Take 2 tablets (1,000 mg total) by mouth every 6 (six) hours as needed for moderate pain or mild pain. 60 tablet 0   albuterol (VENTOLIN HFA) 108 (90 Base) MCG/ACT inhaler Inhale 2 puffs into the lungs every 6 (six) hours as needed. 18 g 5   allopurinol (ZYLOPRIM) 100 MG tablet TAKE 1 TABLET BY MOUTH DAILY 100 tablet 2   amLODipine (NORVASC) 2.5 MG tablet TAKE 1 TABLET BY MOUTH  DAILY 90 tablet 3   baclofen (LIORESAL) 10 MG tablet Take 10 mg by mouth 3 (three) times daily. PRN     Cholecalciferol (VITAMIN D) 50 MCG (2000 UT) tablet Take 2,000 Units by mouth daily.     citalopram (CELEXA) 20 MG tablet Take 1 tablet (20 mg total) by mouth daily. 90 tablet 3   cyanocobalamin (VITAMIN B12) 1000 MCG/ML injection 1000 mcg (1 mg) injection once per per month or as directed 30 mL 1   diclofenac sodium (VOLTAREN) 1 %  GEL Apply 4 g topically 4 (four) times daily. (Patient taking differently: Apply 4 g topically 4 (four) times daily as needed (pain).) 100 g 1   ezetimibe (ZETIA) 10 MG tablet TAKE 1 TABLET BY MOUTH DAILY 100 tablet 2   famotidine (PEPCID) 20 MG tablet Take 1 tablet (20 mg total) by mouth at bedtime. OFFICE VISIT FOR FURTHER REFILLS 90 tablet 1   fluticasone (FLONASE) 50 MCG/ACT nasal spray Place 2 sprays into both nostrils daily. 18.2 mL 2   levothyroxine (SYNTHROID, LEVOTHROID) 50 MCG tablet Take 50 mcg by mouth daily before breakfast.     pantoprazole (PROTONIX) 40 MG tablet TAKE 1 TABLET BY MOUTH  TWICE DAILY 30 TO 60  MINUTES BEFORE MEALS OFFICE VISIT FOR FURTHER REFILLS!!!!! 90  tablet 1   rosuvastatin (CRESTOR) 40 MG tablet TAKE 1 TABLET BY MOUTH DAILY 100 tablet 2   Tiotropium Bromide Monohydrate (SPIRIVA RESPIMAT) 2.5 MCG/ACT AERS INHALE 2 PUFFS INTO THE LUNGS ONCE DAILY. 4 g 11   topiramate (TOPAMAX) 200 MG tablet Take 1 tablet (200 mg total) by mouth daily. 90 tablet 3   No current facility-administered medications for this visit.     Objective:  BP 122/68   Pulse 76   Temp 97.7 F (36.5 C)   Ht 5\' 2"  (1.575 m)   Wt 141 lb (64 kg)   SpO2 100%   BMI 25.79 kg/m  Gen: NAD, resting comfortably    Assessment and Plan   #Depression S: medication(s): none -trialed sertraline and felt much better BUT had severe reflux issues. Off for 4 weeks but still feels slightly better than even on Lexapro. Reflux went back to baseline off of medicine.  -prior lexapro 20 mg but symptoms worsened on medicine after 2-3 years -Prozac helpful in past but lip and tongue numbness -amitriptyline with anticholinergic side effects     12/09/2023    1:13 PM 10/28/2023    1:11 PM 07/26/2023   10:25 AM  Depression screen PHQ 2/9  Decreased Interest 2 2 0  Down, Depressed, Hopeless 1 2 0  PHQ - 2 Score 3 4 0  Altered sleeping 2 2 0  Tired, decreased energy 3 3 0  Change in appetite 0 0 0  Feeling bad or failure about yourself  0 1 0  Trouble concentrating 2 2 0  Moving slowly or fidgety/restless 0 1 0  Suicidal thoughts 0 0 0  PHQ-9 Score 10 13 0  Difficult doing work/chores Somewhat difficult Somewhat difficult Not difficult at all   A/P: Mild improvement despite not being on any medication-she did not tolerate the sertraline and had poor control on Lexapro.  We discussed a few options including trial of citalopram, Pristiq, counseling-she strongly prefers to trial citalopram and we sent in 20 mg with plan for 62-month follow-up-she would like to space out from 6 weeks given multiple medical visits recently  #hematuria- related to kidney stone- had thorough check up  including cystoscopy and CT scan  #Hypertension S: Compliant with amlodipine 2.5 mg -She had been informed her blood pressure was low yesterday and she wanted to make sure it was not too low BP Readings from Last 3 Encounters:  12/09/23 122/68  12/08/23 (!) 113/55  11/22/23 120/74  A/P: well controlled continue current medications    Recommended follow up: Return in about 3 months (around 03/07/2024) for followup or sooner if needed.Schedule b4 you leave. Future Appointments  Date Time Provider Department Center  01/13/2024  1:30 PM Cobb,  Ruby Cola, NP [facility] None  03/08/2024  2:20 PM Shelva Majestic, MD [facility]  07/31/2024 10:15 AM [facility] ANNUAL WELLNESS VISIT 1 [facility]    Lab/Order associations:   ICD-10-CM   1. Recurrent major depressive disorder, in partial remission (HCC)  F33.41     2. Essential hypertension  I10       Meds ordered this encounter  Medications   citalopram (CELEXA) 20 MG tablet    Sig: Take 1 tablet (20 mg total) by mouth daily.    Dispense:  90 tablet    Refill:  3    Return precautions advised.  Tana Conch, MD

## 2023-12-15 ENCOUNTER — Telehealth: Payer: Self-pay | Admitting: Urology

## 2023-12-15 NOTE — Telephone Encounter (Signed)
Patient called regarding CT results , I told her the report has not been read yet and that she will receive call as soon as its read.

## 2023-12-23 ENCOUNTER — Telehealth: Payer: Self-pay

## 2023-12-23 NOTE — Telephone Encounter (Signed)
-----   Message from Donnita Falls sent at 12/23/2023 11:00 AM EST ----- Please notify patient: CT showed no acute findings. She has a small non-obstructing left intrarenal stone which doesn't explain hematuria. Cystoscopy was unremarkable.

## 2023-12-23 NOTE — Telephone Encounter (Signed)
 Patient request to be notified on my chart and by phone. Patient was made aware via phone and mychart message. Patient voiced understanding

## 2024-01-02 ENCOUNTER — Other Ambulatory Visit: Payer: Self-pay | Admitting: Family Medicine

## 2024-01-10 DIAGNOSIS — D485 Neoplasm of uncertain behavior of skin: Secondary | ICD-10-CM | POA: Diagnosis not present

## 2024-01-10 DIAGNOSIS — Z85828 Personal history of other malignant neoplasm of skin: Secondary | ICD-10-CM | POA: Diagnosis not present

## 2024-01-10 DIAGNOSIS — Z08 Encounter for follow-up examination after completed treatment for malignant neoplasm: Secondary | ICD-10-CM | POA: Diagnosis not present

## 2024-01-10 DIAGNOSIS — D225 Melanocytic nevi of trunk: Secondary | ICD-10-CM | POA: Diagnosis not present

## 2024-01-13 ENCOUNTER — Emergency Department (HOSPITAL_COMMUNITY)
Admission: EM | Admit: 2024-01-13 | Discharge: 2024-01-13 | Disposition: A | Discharge status: Home / Self Care | Attending: Student | Admitting: Student

## 2024-01-13 ENCOUNTER — Emergency Department (HOSPITAL_COMMUNITY)

## 2024-01-13 ENCOUNTER — Ambulatory Visit
Admission: EM | Admit: 2024-01-13 | Discharge: 2024-01-13 | Disposition: A | Discharge status: Home / Self Care | Attending: Family Medicine | Admitting: Family Medicine

## 2024-01-13 ENCOUNTER — Ambulatory Visit: Payer: Self-pay

## 2024-01-13 ENCOUNTER — Encounter (HOSPITAL_COMMUNITY): Payer: Self-pay | Admitting: *Deleted

## 2024-01-13 ENCOUNTER — Encounter: Payer: Self-pay | Admitting: Nurse Practitioner

## 2024-01-13 ENCOUNTER — Encounter: Payer: Self-pay | Admitting: Emergency Medicine

## 2024-01-13 ENCOUNTER — Other Ambulatory Visit: Payer: Self-pay

## 2024-01-13 ENCOUNTER — Other Ambulatory Visit: Payer: Self-pay | Admitting: Internal Medicine

## 2024-01-13 ENCOUNTER — Telehealth (INDEPENDENT_AMBULATORY_CARE_PROVIDER_SITE_OTHER): Payer: Medicare Other | Admitting: Nurse Practitioner

## 2024-01-13 DIAGNOSIS — R413 Other amnesia: Secondary | ICD-10-CM | POA: Diagnosis not present

## 2024-01-13 DIAGNOSIS — I6523 Occlusion and stenosis of bilateral carotid arteries: Secondary | ICD-10-CM | POA: Diagnosis not present

## 2024-01-13 DIAGNOSIS — Z91040 Latex allergy status: Secondary | ICD-10-CM | POA: Insufficient documentation

## 2024-01-13 DIAGNOSIS — Z87891 Personal history of nicotine dependence: Secondary | ICD-10-CM

## 2024-01-13 DIAGNOSIS — I10 Essential (primary) hypertension: Secondary | ICD-10-CM | POA: Insufficient documentation

## 2024-01-13 DIAGNOSIS — J449 Chronic obstructive pulmonary disease, unspecified: Secondary | ICD-10-CM

## 2024-01-13 DIAGNOSIS — Z96641 Presence of right artificial hip joint: Secondary | ICD-10-CM | POA: Insufficient documentation

## 2024-01-13 DIAGNOSIS — G4733 Obstructive sleep apnea (adult) (pediatric): Secondary | ICD-10-CM

## 2024-01-13 DIAGNOSIS — E039 Hypothyroidism, unspecified: Secondary | ICD-10-CM | POA: Diagnosis not present

## 2024-01-13 DIAGNOSIS — Z7951 Long term (current) use of inhaled steroids: Secondary | ICD-10-CM | POA: Insufficient documentation

## 2024-01-13 DIAGNOSIS — Z79899 Other long term (current) drug therapy: Secondary | ICD-10-CM | POA: Diagnosis not present

## 2024-01-13 DIAGNOSIS — R404 Transient alteration of awareness: Secondary | ICD-10-CM | POA: Diagnosis not present

## 2024-01-13 LAB — URINALYSIS, ROUTINE W REFLEX MICROSCOPIC
Bacteria, UA: NONE SEEN
Bilirubin Urine: NEGATIVE
Glucose, UA: NEGATIVE mg/dL
Ketones, ur: NEGATIVE mg/dL
Nitrite: NEGATIVE
Protein, ur: NEGATIVE mg/dL
Specific Gravity, Urine: 1.011 (ref 1.005–1.030)
pH: 5 (ref 5.0–8.0)

## 2024-01-13 LAB — CBC WITH DIFFERENTIAL/PLATELET
Abs Immature Granulocytes: 0.02 10*3/uL (ref 0.00–0.07)
Basophils Absolute: 0.1 10*3/uL (ref 0.0–0.1)
Basophils Relative: 1 %
Eosinophils Absolute: 0.1 10*3/uL (ref 0.0–0.5)
Eosinophils Relative: 2 %
HCT: 41 % (ref 36.0–46.0)
Hemoglobin: 13.3 g/dL (ref 12.0–15.0)
Immature Granulocytes: 0 %
Lymphocytes Relative: 32 %
Lymphs Abs: 2.2 10*3/uL (ref 0.7–4.0)
MCH: 31.8 pg (ref 26.0–34.0)
MCHC: 32.4 g/dL (ref 30.0–36.0)
MCV: 98.1 fL (ref 80.0–100.0)
Monocytes Absolute: 0.5 10*3/uL (ref 0.1–1.0)
Monocytes Relative: 8 %
Neutro Abs: 4 10*3/uL (ref 1.7–7.7)
Neutrophils Relative %: 57 %
Platelets: 173 10*3/uL (ref 150–400)
RBC: 4.18 MIL/uL (ref 3.87–5.11)
RDW: 13.4 % (ref 11.5–15.5)
WBC: 6.9 10*3/uL (ref 4.0–10.5)
nRBC: 0 % (ref 0.0–0.2)

## 2024-01-13 LAB — BASIC METABOLIC PANEL
Anion gap: 8 (ref 5–15)
BUN: 14 mg/dL (ref 8–23)
CO2: 21 mmol/L — ABNORMAL LOW (ref 22–32)
Calcium: 8.8 mg/dL — ABNORMAL LOW (ref 8.9–10.3)
Chloride: 109 mmol/L (ref 98–111)
Creatinine, Ser: 0.72 mg/dL (ref 0.44–1.00)
GFR, Estimated: >60 mL/min (ref >60)
Glucose, Bld: 94 mg/dL (ref 70–99)
Potassium: 3.4 mmol/L — ABNORMAL LOW (ref 3.5–5.1)
Sodium: 138 mmol/L (ref 135–145)

## 2024-01-13 LAB — POCT FASTING CBG KUC MANUAL ENTRY: POCT Glucose (KUC): 91 mg/dL (ref 70–99)

## 2024-01-13 MED ORDER — FOSFOMYCIN TROMETHAMINE 3 G PO PACK
3.0000 g | PACK | Freq: Once | ORAL | Status: AC
  Administered 2024-01-13: 3 g via ORAL
  Filled 2024-01-13: qty 3

## 2024-01-13 NOTE — Telephone Encounter (Signed)
 Appears confusion with an acute worsening-she was forwarded to the emergency department

## 2024-01-13 NOTE — Telephone Encounter (Signed)
 Chief Complaint: altered mental status  Symptoms: altered mental status, confusion Frequency: over the past week Pertinent Negatives: Patient denies one-sided weakness, difficulty with word retrieval, headache, dizziness, SOB different than her baseline, CP, N/V, fever Disposition: [] ED /[x] Urgent Care (no appt availability in office) / [] Appointment(In office/virtual)/ []  [facility]/ [] Home Care/ [] Refused Recommended Disposition /[] [facility]/ []  Follow-up with PCP Additional Notes: Pt reports she thinks that citalopram (Celexa) is making her confused. Pt started taking the med on 2/14 and states over the past week she is getting confused. Pt states she has episodes of not remembering how to get to people's houses, houses that she has driven to for years. Pt left the water running and forgot. Gets confused paying bills. Denies headache, dizziness, blurry vision, trouble walking, falls, CP, N/V, difficulty with word retrieval, slurred speech, fever, neck pain/stiffness, flu-like symptoms. Pt endorses some difficulty breathing w/ hx of COPD on CPAP. States it is not worse than her usual. RN asked pt about urinary symptoms, pt denies frequency, urgency, dysuria, foul smelling urine but endorses hematuria and states that is normal for her and being addressed by urology. Pt is coherent, alert, and oriented on the phone. Denies hallucinations.Pt is not presently confused - this comes and goes with certain activities. For new confusion RN advised pt she needs to be seen within 4 hours. No availability at the office in that time frame and no appts available at other offices nearby. RN advised pt she needs to go to an UC. Pt was agreeable. Pt states her husband will take her. RN advised pt if she gets worse she needs to call 911 and she verbalized understanding.  RN advised pt RN would relay concern for allergy and side effects of med to the office for follow-up, please follow-up with  her.   Copied from CRM [id number]. Topic: Clinical - Red Word Triage >> Jan 13, 2024  2:47 PM Alison Brooks wrote: Red Word that prompted transfer to Nurse Triage: Confusion >> Jan 13, 2024  2:48 PM Alison Brooks wrote: Patient states she's been taking citalopram (CELEXA) 20 MG tablet but doesn't feel as though it's agreeing with her. Patient states she's forgetting things a lot more and she's concerned.  Reason for Disposition  [1] Acting confused (e.g., disoriented, slurred speech) AND [2] brief (now gone)  Answer Assessment - Initial Assessment Questions 1. LEVEL OF CONSCIOUSNESS: "How is he (she, the patient) acting right now?" (e.g., alert-oriented, confused, lethargic, stuporous, comatose)     Alert and oriented 2. ONSET: "When did the confusion start?"  (minutes, hours, days)     Over this past week 3. PATTERN "Does this come and go, or has it been constant since it started?"  "Is it present now?"     Comes and goes with certain things she has noticed, "doing my bills and stuff I am getting confused" 4. ALCOHOL or DRUGS: "Has he been drinking alcohol or taking any drugs?"      No 5. NARCOTIC MEDICINES: "Has he been receiving any narcotic medications?" (e.g., morphine, Vicodin)     No 6. CAUSE: "What do you think is causing the confusion?"      Citalopram - started 2/14 7. OTHER SYMPTOMS: "Are there any other symptoms?" (e.g., difficulty breathing, headache, fever, weakness)     States she was picking up her granddaughter from her daughter's house and she sat in her car and did not know how to get there. I have a friend in a  nursing home and didn't know how to get there. I don't know how to get to her house either. Places she has been going to for years. Two days ago she turned the water on and left the water running. Denies weakness. Denies headaches. Denies N/V. Denies blurry vision. Denies worsening difficulty breathing - but does have some difficulty breathing "comes and goes" d/t COPD,  uses CPAP. Denies CP. "I have blood in my urine but that is normal for me and I just got through a bunch of stuff with my urologist." Denies chills and flu-like symptoms. Denies neck pain and stiffness. Denies one-sided weakness. Denies aphasia or speech difficulty. No loss of consciousness.  Protocols used: Confusion - Delirium-A-AH

## 2024-01-13 NOTE — ED Notes (Signed)
 Pt cannot urinate at this time. Pt given water to drink.

## 2024-01-13 NOTE — ED Triage Notes (Signed)
 Memory loss that started yesterday.  Could not remember how to get to places she is familiar with.  She was recently started on Celexa 2/14 and thought this could be causing her symptoms.  PCP told her come to Urgent Care to be evaluated.    States she feels fine.  Denies headaches or numbness or weakness.  States she has noticed her legs feel heavier than usual since Wednesday.

## 2024-01-13 NOTE — Progress Notes (Signed)
 Patient ID: Alison Brooks, female     DOB: 06/04/1956, 68 y.o.      MRN: [id number]  No chief complaint on file.   Virtual Visit via Video Note  I connected with Alison Brooks on 01/13/24 at  1:30 PM EDT by a video enabled telemedicine application and verified that I am speaking with the correct person using two identifiers.  Location: Patient: Home Provider: Office   I discussed the limitations of evaluation and management by telemedicine and the availability of in person appointments. The patient expressed understanding and agreed to proceed.  History of Present Illness: 68 year old female, former smoker followed for OSA and COPD. Past medical history significant for migraines, HTN, aortic atherosclerosis, multiple pulmonary nodules, COPD, GERD, hypothyroid, chronic cystitis, HLD, RLS, hx of hemicolectomy. She is followed by the lung cancer screening program as well.   TEST/EVENTS:  03/06/2015 PFT: FVC 85, FEV1 81, ratio 77, TLC 88, DLCOunc 65 04/11/2023 HST: AHI 7.7/h (4%), 22.8/h (3%), SpO2 low 88%  03/29/2023: OV with Dr. Maple Hudson for sleep consult.  Retired Engineer, civil (consulting).  Snores pretty routinely.  Sometimes wakes with a choke or cough.  Husband describes witnessed apneas.  Admits tired routinely and says she could sleep 24 hours a day.  No sleep medicines.  Drinks 3 cups of coffee.  Has had ENT surgery for tonsils and repair of nasal fracture in the past.  Usual bedtime between midnight and 1 AM with short sleep latency.  Waking 3-4 times a night.  Some restless leg but no complex parasomnias.  Frequent headaches.  Home sleep study ordered for further evaluation.  08/26/2023: OV with Alison Anthis NP The patient, with a history of snoring and chronic fatigue, was suspected to have sleep apnea. She reports feeling tired all the time, with her spouse noting episodes of apnea during sleep. The patient has undergone a home sleep study, which revealed mild sleep apnea. Despite the classification, the  patient experiences significant daytime symptoms, including a persistent sense of tiredness and a propensity to sleep during the day. She reports that she could sleep for 24 hours a day, indicating a state of chronic fatigue. The patient also mentions that she already sleeps on her side, a positional adjustment often recommended for mild sleep apnea, but this has not alleviated her symptoms. She denies drowsy driving, morning headaches, sleep parasomnias/paralysis. She does not take any sleep aids. Breathing is stable. No issues with her COPD. No significant cough or wheezing. Uses Spiriva and infrequent PRN SABA.   11/04/2023: OV with Alison Kovack NP for follow-up via virtual visit.  Since her last visit, she started on CPAP therapy.  She has been having difficulties adjusting to this.  She feels like she tries to get to the 4-hour mark most nights and then usually takes it off.  It is caused her to have increased nasal congestion and a cough at night.  Drainage is clear.  She denies any fevers, facial tenderness, purulent sputum production.  Does not really have any issues with her cough during the day.  Breathing feels like it is at her baseline.  Has not really noticed if she is sleeping better or feeling better rested with the CPAP due to the issues she is having with it at night.  She is currently wearing a nasal mask.  No issues with drowsy driving, morning headaches or sleep parasomnia/paralysis. 10/04/2023-11/02/2023: CPAP 5-15 cmH2O 28/30 days; 60% >4 hr; average use 4 hr 5 min Pressure 95th 9.2  Leaks 95th 17.6 AHI 1.9  12/02/2023: Patient presents today for follow-up via virtual visit.  At her last visit, we adjusted her CPAP settings to 5 to 9 cm of water.  She does feel like this is working better for her.  She is still having trouble with cough at night.  Does feel like the Flonase has helped with some of her nasal congestion.  She did not wear her CPAP for couple nights and did not have any issues with  the cough.  She has not made any adjustments to her humidity settings.  She is currently wearing a nasal mask.  Not sure if she is mouth breathing at night.  Does feel like her stretches of sleep are more restful when she has the CPAP on.  She just feels like she wants to take it off after she reaches her 4-hour threshold due to the cough.  Denies any issues with drowsy driving or morning headaches. Breathing has been doing well.  No issues with wheezing or chest congestion.  Minimal Saba use 11/01/2023-11/30/2023: CPAP 5-9 cmH2O 26/30 days; 73% >4 hr; average use 4 hr 49 min Pressure 95th 8.5 Leaks 95th 17.6 AHI 1.2  01/13/2024: Today - follow up Patient presents today virtual visit for follow-up.  She was supposed to get a new mask after her last visit because she feels like the nasal mask puts too much pressure on her sinuses and causes some soreness, which causes her to take it off partway through the night.  She does put it on most nights but does not always make it the full 4 hours.  When she is able to sleep the whole night with it, she does feel better rested during the day.  Feels like since she has had more trouble with the nose soreness and decreased usage, she is more sleepy during the day so definitely feels like the CPAP benefits her when she is able to use it.  Pressures do feel like they are better.  Not having any significant issues with the cough now.  Denies any issues with drowsy driving or morning headaches.   Breathing has been doing well.  No issues with wheezing or chest congestion.  Minimal Saba use.  12/13/2023-01/11/2024 CPAP 5-9 cmH2O 26/30 days; 47% >4 hr; average use 4 hr 6 min Pressure 95th 7.4 Leaks 95th 14.6 AHI 0.5  Allergies  Allergen Reactions   Latex Other (See Comments)    blisters   Zofran [Ondansetron Hcl] Nausea And Vomiting and Other (See Comments)    headache   Immunization History  Administered Date(s) Administered   Fluad Quad(high Dose 65+)  07/17/2019, 10/02/2021   Influenza Whole 08/11/1999, 08/11/2007, 07/25/2009   Influenza,inj,Quad PF,6+ Mos 09/03/2013, 07/12/2014, 08/04/2015   Influenza-Unspecified 07/25/2017   Moderna Sars-Covid-2 Vaccination 11/17/2019, 12/17/2019, 08/28/2020   PNEUMOCOCCAL CONJUGATE-20 10/02/2021   Pneumococcal Polysaccharide-23 07/17/2019   Td 10/25/1998, 10/25/2006   Tdap 04/20/2018   Past Medical History:  Diagnosis Date   Anxiety    Arthritis    back, neck   Chronic back pain    COPD (chronic obstructive pulmonary disease) (HCC) 2021   after Covid   Depression    Dyspnea    Since covid , NOW ON INHALERS   Fatty liver    Fibromyalgia    GERD (gastroesophageal reflux disease)    Heart murmur    Hematuria    History of adenomatous polyp of colon    History of COVID-19 10/2020   History of kidney  stones    History of seizure    x1 2007 post op (per pt neurologist work-up done ?mixture of anesthesia medication and prozac that pt had taken)  and has not any issues since    History of squamous cell carcinoma excision    Hyperlipidemia    Hypertension    Hypothyroidism    IC (interstitial cystitis)    Long COVID    Migraine    Osteoporosis    Pulmonary nodules    multiple per ct -- monitored by pcp   Right ureteral stone    RLS (restless legs syndrome)     Tobacco History: Social History   Tobacco Use  Smoking Status Former   Current packs/day: 0.00   Average packs/day: 1.3 packs/day for 40.0 years (50.0 ttl pk-yrs)   Types: Cigarettes   Start date: 06/07/1968   Quit date: 06/07/2008   Years since quitting: 15.6  Smokeless Tobacco Never   Counseling given: Not Answered   Outpatient Medications Prior to Visit  Medication Sig Dispense Refill   acetaminophen (TYLENOL) 500 MG tablet Take 2 tablets (1,000 mg total) by mouth every 6 (six) hours as needed for moderate pain or mild pain. 60 tablet 0   albuterol (VENTOLIN HFA) 108 (90 Base) MCG/ACT inhaler Inhale 2 puffs into the  lungs every 6 (six) hours as needed. 18 g 5   allopurinol (ZYLOPRIM) 100 MG tablet TAKE 1 TABLET BY MOUTH DAILY 100 tablet 2   amLODipine (NORVASC) 2.5 MG tablet TAKE 1 TABLET BY MOUTH  DAILY 90 tablet 3   baclofen (LIORESAL) 10 MG tablet Take 10 mg by mouth 3 (three) times daily. PRN     Cholecalciferol (VITAMIN D) 50 MCG (2000 UT) tablet Take 2,000 Units by mouth daily.     citalopram (CELEXA) 20 MG tablet Take 1 tablet (20 mg total) by mouth daily. 90 tablet 3   cyanocobalamin (VITAMIN B12) 1000 MCG/ML injection 1000 mcg (1 mg) injection once per per month or as directed 30 mL 1   diclofenac sodium (VOLTAREN) 1 % GEL Apply 4 g topically 4 (four) times daily. (Patient taking differently: Apply 4 g topically 4 (four) times daily as needed (pain).) 100 g 1   ezetimibe (ZETIA) 10 MG tablet TAKE 1 TABLET BY MOUTH DAILY 100 tablet 2   famotidine (PEPCID) 20 MG tablet Take 1 tablet (20 mg total) by mouth at bedtime. OFFICE VISIT FOR FURTHER REFILLS 90 tablet 1   fluticasone (FLONASE) 50 MCG/ACT nasal spray Place 2 sprays into both nostrils daily. 18.2 mL 2   levothyroxine (SYNTHROID, LEVOTHROID) 50 MCG tablet Take 50 mcg by mouth daily before breakfast.     pantoprazole (PROTONIX) 40 MG tablet TAKE 1 TABLET BY MOUTH  TWICE DAILY 30 TO 60  MINUTES BEFORE MEALS OFFICE VISIT FOR FURTHER REFILLS!!!!! 90 tablet 1   rosuvastatin (CRESTOR) 40 MG tablet TAKE 1 TABLET BY MOUTH DAILY 100 tablet 2   Tiotropium Bromide Monohydrate (SPIRIVA RESPIMAT) 2.5 MCG/ACT AERS INHALE 2 PUFFS INTO THE LUNGS ONCE DAILY. 4 g 11   topiramate (TOPAMAX) 200 MG tablet Take 1 tablet (200 mg total) by mouth daily. 90 tablet 3   No facility-administered medications prior to visit.     Review of Systems:   Constitutional: No weight loss or gain, night sweats, fevers, chills, or lassitude. +Fatigue  HEENT: No headaches, difficulty swallowing, tooth/dental problems, or sore throat. No sneezing, itching, ear ache +nasal congestion,  post nasal drip CV:  No chest pain, orthopnea,  PND, swelling in lower extremities, anasarca, dizziness, palpitations, syncope Resp: +snoring (without CPAP); cough (resolved). No shortness of breath with exertion or at rest. No excess mucus or change in color of mucus. No hemoptysis. No wheezing.  No chest wall deformity GI:  No heartburn, indigestion, abdominal pain, nausea, vomiting, diarrhea, change in bowel habits, loss of appetite, bloody stools.  GU: No nocturia  Skin: No rash, lesions, ulcerations MSK:  No joint pain or swelling.   Neuro: No dizziness or lightheadedness.  Psych: No depression or anxiety. Mood stable. +sleep disturbance (due to mask)  Observations/Objective: Patient is well-developed, well-nourished in no acute distress. A&Ox3. Resting comfortably at home. Unlabored breathing. Speech is clear and coherent with logical content.    Assessment and Plan: Mild obstructive sleep apnea Mild OSA on CPAP. Suboptimal compliance due to issues with mask. I had ordered a hybrid full face mask at her last visit but the DME company has yet to get this to her. She has called them a few times. She does feel like the pressure settings are okay and no longer having issues with her cough. She does receive benefit from use of CPAP when able to wear it. Will provide her with a sample mask AirFit F40 with small wide, medium and large mask options. Advised her to start with either the small wide or medium. Sizing guide is available with the mask. She will pick this up from the office next week. Aware to notify if this works well for her so we can send in orders. Understands risks of untreated mild OSA and potential treatment options. Safe driving practices reviewed.   Patient Instructions  Continue CPAP every night, minimum of 4-6 hours a night.  Change equipment as directed. Wash your tubing with warm soap and water daily, hang to dry. Wash humidifier portion weekly. Use bottled, distilled water  and change daily Be aware of reduced alertness and do not drive or operate heavy machinery if experiencing this or drowsiness.  Exercise encouraged, as tolerated. Healthy weight management discussed.  Avoid or decrease alcohol consumption and medications that make you more sleepy, if possible. Notify if persistent daytime sleepiness occurs even with consistent use of PAP therapy.   We discussed how untreated sleep apnea puts an individual at risk for cardiac arrhthymias, pulm HTN, DM, stroke and increases their risk for daytime accidents.   Pick up mask on Monday or Tuesday next week. Use the size guide.  Let me know what size works best for you    Continue flonase nasal spray 2 sprays each nostril before putting on CPAP  Continue spiriva 2 puffs daily Continue Albuterol inhaler 2 puffs every 6 hours as needed for shortness of breath or wheezing. Notify if symptoms persist despite rescue inhaler/neb use.    Follow up in 3 months with Dr. Maple Hudson or Alison Addison Emersen Carroll,NP to see CPAP is going, or sooner, if needed   COPD (chronic obstructive pulmonary disease) (HCC) Mild with minimal symptom burden. Compensated on current regimen. Follow by her PCP.    I discussed the assessment and treatment plan with the patient. The patient was provided an opportunity to ask questions and all were answered. The patient agreed with the plan and demonstrated an understanding of the instructions.   The patient was advised to call back or seek an in-person evaluation if the symptoms worsen or if the condition fails to improve as anticipated.  I provided 25 minutes of non-face-to-face time during this encounter.   Noemi Chapel,  NP

## 2024-01-13 NOTE — ED Notes (Signed)
 Patient is being discharged from the Urgent Care and sent to the Emergency Department via POV . Per provider, patient is in need of higher level of care due to sudden confusion. Patient is aware and verbalizes understanding of plan of care.  Vitals:   01/13/24 1547  BP: 124/73  Pulse: 72  Resp: 18  Temp: 98.9 F (37.2 C)  SpO2: 97%

## 2024-01-13 NOTE — ED Notes (Signed)
 This RN made an error in charting an EKG was printed and exported in her name due to wrong labels being handed to the pt and the EKG being put in under this pts name.

## 2024-01-13 NOTE — Telephone Encounter (Signed)
 See below

## 2024-01-13 NOTE — Assessment & Plan Note (Signed)
 Mild OSA on CPAP. Suboptimal compliance due to issues with mask. I had ordered a hybrid full face mask at her last visit but the DME company has yet to get this to her. She has called them a few times. She does feel like the pressure settings are okay and no longer having issues with her cough. She does receive benefit from use of CPAP when able to wear it. Will provide her with a sample mask AirFit F40 with small wide, medium and large mask options. Advised her to start with either the small wide or medium. Sizing guide is available with the mask. She will pick this up from the office next week. Aware to notify if this works well for her so we can send in orders. Understands risks of untreated mild OSA and potential treatment options. Safe driving practices reviewed.   Patient Instructions  Continue CPAP every night, minimum of 4-6 hours a night.  Change equipment as directed. Wash your tubing with warm soap and water daily, hang to dry. Wash humidifier portion weekly. Use bottled, distilled water and change daily Be aware of reduced alertness and do not drive or operate heavy machinery if experiencing this or drowsiness.  Exercise encouraged, as tolerated. Healthy weight management discussed.  Avoid or decrease alcohol consumption and medications that make you more sleepy, if possible. Notify if persistent daytime sleepiness occurs even with consistent use of PAP therapy.   We discussed how untreated sleep apnea puts an individual at risk for cardiac arrhthymias, pulm HTN, DM, stroke and increases their risk for daytime accidents.   Pick up mask on Monday or Tuesday next week. Use the size guide.  Let me know what size works best for you    Continue flonase nasal spray 2 sprays each nostril before putting on CPAP  Continue spiriva 2 puffs daily Continue Albuterol inhaler 2 puffs every 6 hours as needed for shortness of breath or wheezing. Notify if symptoms persist despite rescue inhaler/neb  use.    Follow up in 3 months with Dr. Maple Hudson or Florentina Addison Manjot Hinks,NP to see CPAP is going, or sooner, if needed

## 2024-01-13 NOTE — ED Triage Notes (Signed)
 Pt has noticed moments of memory loss, on Wednesday and couldn't remember where her daughter had lived.  Pt states she started a new med Celexa (2/14).  Seen at UC earlier today and sent here. Denies any weakness or pain.  + legs feel heavy.

## 2024-01-13 NOTE — Patient Instructions (Signed)
 Continue CPAP every night, minimum of 4-6 hours a night.  Change equipment as directed. Wash your tubing with warm soap and water daily, hang to dry. Wash humidifier portion weekly. Use bottled, distilled water and change daily Be aware of reduced alertness and do not drive or operate heavy machinery if experiencing this or drowsiness.  Exercise encouraged, as tolerated. Healthy weight management discussed.  Avoid or decrease alcohol consumption and medications that make you more sleepy, if possible. Notify if persistent daytime sleepiness occurs even with consistent use of PAP therapy.   We discussed how untreated sleep apnea puts an individual at risk for cardiac arrhthymias, pulm HTN, DM, stroke and increases their risk for daytime accidents.   Pick up mask on Monday or Tuesday next week. Use the size guide.  Let me know what size works best for you    Continue flonase nasal spray 2 sprays each nostril before putting on CPAP  Continue spiriva 2 puffs daily Continue Albuterol inhaler 2 puffs every 6 hours as needed for shortness of breath or wheezing. Notify if symptoms persist despite rescue inhaler/neb use.    Follow up in 3 months with Dr. Maple Hudson or Florentina Addison Saverio Kader,NP to see CPAP is going, or sooner, if needed

## 2024-01-13 NOTE — Assessment & Plan Note (Signed)
 Mild with minimal symptom burden. Compensated on current regimen. Follow by her PCP.

## 2024-01-14 LAB — URINE CULTURE: Culture: NO GROWTH

## 2024-01-14 NOTE — ED Provider Notes (Signed)
 [place] EMERGENCY DEPARTMENT AT [facility] Provider Note  CSN: [id number] Arrival date & time: 01/13/24 1727  Chief Complaint(s) No chief complaint on file.  HPI Alison Brooks is a 68 y.o. female with PMH COPD, depression, fibromyalgia, HTN, HLD, hypothyroidism, restless leg syndrome, interstitial cystitis who presents emergency department for evaluation of transient memory issues.  States that yesterday she had multiple instances where she could not remember directions to places she has been before.  Was recently started on Celexa on 2/14 and thought this may be causing her symptoms and spoke with her primary care physician who encouraged her to go to urgent care for further evaluation.  Urgent care rightly transferred her to the emergency department for further evaluation.  Here in the emergency room, she states that she feels like her memory issues are actually improving.  She denies any neurologic deficits including numbness, tingling, weakness, denies chest pain, shortness of breath, abdominal pain, nausea, vomiting or other systemic symptoms.  Denies illicit substance use or alcohol use.  Patient is alert and oriented x 4 here in the emergency room.   Past Medical History Past Medical History:  Diagnosis Date   Anxiety    Arthritis    back, neck   Chronic back pain    COPD (chronic obstructive pulmonary disease) (HCC) 2021   after Covid   Depression    Dyspnea    Since covid , NOW ON INHALERS   Fatty liver    Fibromyalgia    GERD (gastroesophageal reflux disease)    Heart murmur    Hematuria    History of adenomatous polyp of colon    History of COVID-19 10/2020   History of kidney stones    History of seizure    x1 2007 post op (per pt neurologist work-up done ?mixture of anesthesia medication and prozac that pt had taken)  and has not any issues since    History of squamous cell carcinoma excision    Hyperlipidemia    Hypertension    Hypothyroidism    IC  (interstitial cystitis)    Long COVID    Migraine    Osteoporosis    Pulmonary nodules    multiple per ct -- monitored by pcp   Right ureteral stone    RLS (restless legs syndrome)    Patient Active Problem List   Diagnosis Date Noted   Rhinitis 11/04/2023   Mild obstructive sleep apnea 08/26/2023   Age-related osteoporosis without current pathological fracture 05/30/2023   Snoring 03/29/2023   Hyperglycemia 09/22/2022   S/P total right hip arthroplasty 05/18/2022   S/P right hemicolectomy 06/12/2021   Aortic atherosclerosis (HCC) 03/31/2021   COPD (chronic obstructive pulmonary disease) (HCC) 11/24/2020   History of total hysterectomy 07/17/2019   Hypothyroidism 07/17/2019   Vertigo 11/06/2016   Dyspnea 11/02/2014   Upper airway cough syndrome 11/02/2014   Gout 06/28/2014   Vitamin B12 deficiency 06/28/2014   Multiple pulmonary nodules 06/28/2014   Essential hypertension 05/17/2014   Obesity (BMI 30-39.9) 01/14/2014   Lateral epicondylitis (tennis elbow) 01/02/2014   GERD (gastroesophageal reflux disease) 03/26/2013   LEG CRAMPS 05/16/2009   RESTLESS LEG SYNDROME, SEVERE 03/14/2009   Chronic interstitial cystitis 01/20/2009   Depression, major, recurrent (HCC) 03/19/2008   TUBULOVILLOUS ADENOMA, COLON 02/17/2008   Hyperlipidemia 02/17/2008   NEPHROLITHIASIS 02/17/2008   Former smoker 12/21/2007   MIGRAINE, CLASSICAL W/O INTRACTABLE MIGRAINE 06/09/2007   SYMPTOM, MEMORY LOSS 06/09/2007   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 Authorizing Provider  acetaminophen (TYLENOL) 500 MG tablet Take 2 tablets (1,000 mg total) by mouth every 6 (six) hours as needed for moderate pain or mild pain. 05/19/22   Jenne Pane, PA-C  albuterol (VENTOLIN HFA) 108 (90 Base) MCG/ACT inhaler Inhale 2 puffs into the lungs every 6  (six) hours as needed. 10/10/23   Shelva Majestic, MD  allopurinol (ZYLOPRIM) 100 MG tablet TAKE 1 TABLET BY MOUTH DAILY 01/03/24   Shelva Majestic, MD  amLODipine (NORVASC) 2.5 MG tablet TAKE 1 TABLET BY MOUTH  DAILY 06/03/21   Shelva Majestic, MD  baclofen (LIORESAL) 10 MG tablet Take 10 mg by mouth 3 (three) times daily. PRN    [provider]  Cholecalciferol (VITAMIN D) 50 MCG (2000 UT) tablet Take 2,000 Units by mouth daily.    [provider]  citalopram (CELEXA) 20 MG tablet Take 1 tablet (20 mg total) by mouth daily. 12/09/23   Shelva Majestic, MD  cyanocobalamin (VITAMIN B12) 1000 MCG/ML injection 1000 mcg (1 mg) injection once per per month or as directed 09/22/22   Shelva Majestic, MD  diclofenac sodium (VOLTAREN) 1 % GEL Apply 4 g topically 4 (four) times daily. Patient taking differently: Apply 4 g topically 4 (four) times daily as needed (pain). 04/20/18   Ardith Dark, MD  ezetimibe (ZETIA) 10 MG tablet TAKE 1 TABLET BY MOUTH DAILY 09/07/23   Shelva Majestic, MD  famotidine (PEPCID) 20 MG tablet Take 1 tablet (20 mg total) by mouth at bedtime. OFFICE VISIT FOR FURTHER REFILLS 09/30/23   Hilarie Fredrickson, MD  fluticasone [facility]) 50 MCG/ACT nasal spray Place 2 sprays into both nostrils daily. 11/04/23   Cobb, Ruby Cola, NP  levothyroxine (SYNTHROID, LEVOTHROID) 50 MCG tablet Take 50 mcg by mouth daily before breakfast. 11/22/18   [provider]  pantoprazole (PROTONIX) 40 MG tablet TAKE 1 TABLET BY MOUTH  TWICE DAILY 30 TO 60  MINUTES BEFORE MEALS OFFICE VISIT FOR FURTHER REFILLS!!!!! 09/30/23   Hilarie Fredrickson, MD  rosuvastatin (CRESTOR) 40 MG tablet TAKE 1 TABLET BY MOUTH DAILY 01/03/24   Shelva Majestic, MD  Tiotropium Bromide Monohydrate (SPIRIVA RESPIMAT) 2.5 MCG/ACT AERS INHALE 2 PUFFS INTO THE LUNGS ONCE DAILY. 10/10/23   Shelva Majestic, MD  topiramate (TOPAMAX) 200 MG tablet Take 1 tablet (200 mg total) by mouth daily. 11/23/23   Shelva Majestic, MD  orlistat (XENICAL) 120 MG capsule Take 1 capsule (120 mg total) by mouth 3 (three) times daily with meals. 05/30/15 02/29/16  Shelva Majestic, MD  Past Surgical History Past Surgical History:  Procedure Laterality Date   CARDIOVASCULAR STRESS TEST  09/02/2004   normal perfusion study/  normal LV function and wall motion, ef 64%   CARPAL TUNNEL RELEASE Right 1998   CERVICAL FUSION  1992   CHOLECYSTECTOMY  1995   COLONOSCOPY     CYSTO/  BOTOX INJECTION  12-27-2008  &  04-01-2006   CYSTO/  HYDRODISTENTION/  INSTILLATION THERAPY  03/04/2006   CYSTOSCOPY W/ URETERAL STENT PLACEMENT Right 04/02/2016   Procedure: CYSTOSCOPY WITH RIGHT RETROGRADE, URETEROSCOPY PYELOGRAM LASER LITHOTRIPSY,/URETERAL STENT PLACEMENT;  Surgeon: Ihor Gully, MD;  Location: [facility] [place];  Service: Urology;  Laterality: Right;   HEMORRHOID SURGERY  01/03/2004   HOLMIUM LASER APPLICATION Right 04/02/2016   Procedure: HOLMIUM LASER APPLICATION;  Surgeon: Ihor Gully, MD;  Location: [facility];  Service: Urology;  Laterality: Right;   LAPAROSCOPIC RIGHT HEMI COLECTOMY N/A 06/12/2021   Procedure: LAPAROSCOPIC RIGHT HEMI COLECTOMY;  Surgeon: Andria Meuse, MD;  Location: [facility];  Service: General;  Laterality: N/A;   LUMBAR FUSION  1996   L4 --S1   ROTATOR CUFF REPAIR Left 2004   SHOULDER ARTHROSCOPY WITH BICEPS TENDON REPAIR Left 12/2015   SPINAL CORD STIMULATOR IMPLANT  x4  last one 2013   STONE EXTRACTION WITH BASKET Right 04/02/2016   Procedure: STONE EXTRACTION WITH BASKET;  Surgeon: Ihor Gully, MD;  Location: [facility];  Service: Urology;  Laterality: Right;   TONSILLECTOMY  1964   TOTAL HIP ARTHROPLASTY Right 05/18/2022   Procedure: TOTAL HIP ARTHROPLASTY ANTERIOR APPROACH;  Surgeon: Sheral Apley, MD;  Location: [facility];  Service: Orthopedics;  Laterality: Right;   TRANSTHORACIC ECHOCARDIOGRAM  04/17/2014   EF 60-65%/  trivial AR, MR, and TR   UPPER GASTROINTESTINAL ENDOSCOPY     VAGINAL HYSTERECTOMY  1984   WRIST GANGLION EXCISION Right 02/05/2004   Family History Family History  Problem Relation Age of Onset   Heart attack Mother        Mom 43, Dad 4   Hypertension Mother    Heart disease Mother    Heart disease Father    Diabetes Father    Colon cancer Maternal Grandmother        not sure age of onset   Esophageal cancer Neg Hx    Rectal cancer Neg Hx    Stomach cancer Neg Hx     Social History Social History   Tobacco Use   Smoking status: Former    Current packs/day: 0.00    Average packs/day: 1.3 packs/day for 40.0 years (50.0 ttl pk-yrs)    Types: Cigarettes    Start date: 06/07/1968    Quit date: 06/07/2008    Years since quitting: 15.6   Smokeless tobacco: Never  Vaping Use   Vaping status: Never Used  Substance Use Topics   Alcohol use: Yes    Alcohol/week: 0.0 standard drinks of alcohol    Comment: VERY RARE   Drug use: No   Allergies Latex and Zofran [ondansetron hcl]  Review of Systems Review of Systems  Psychiatric/Behavioral:  Positive for confusion.     Physical Exam Vital Signs  I have reviewed the triage vital signs BP 138/61   Pulse (!) 53   Temp 98.5 F (36.9 C) (Oral)   Resp 16   Ht 5\' 2"  (1.575 m)   Wt 63 kg   SpO2 98%   BMI 25.42 kg/m   Physical Exam Vitals and  nursing note reviewed.  Constitutional:      General: She is not in acute distress.    Appearance: She is well-developed.  HENT:     Head: Normocephalic and atraumatic.  Eyes:     Conjunctiva/sclera: Conjunctivae normal.  Cardiovascular:     Rate and Rhythm: Normal rate and regular rhythm.     Heart sounds: No murmur heard. Pulmonary:     Effort: Pulmonary effort is normal. No respiratory distress.     Breath sounds: Normal breath sounds.  Abdominal:     Palpations:  Abdomen is soft.     Tenderness: There is no abdominal tenderness.  Musculoskeletal:        General: No swelling.     Cervical back: Neck supple.  Skin:    General: Skin is warm and dry.     Capillary Refill: Capillary refill takes less than 2 seconds.  Neurological:     Mental Status: She is alert.     Cranial Nerves: No cranial nerve deficit.     Sensory: No sensory deficit.     Motor: No weakness.  Psychiatric:        Mood and Affect: Mood normal.     ED Results and Treatments Labs (all labs ordered are listed, but only abnormal results are displayed) Labs Reviewed  BASIC METABOLIC PANEL - Abnormal; Notable for the following components:      Result Value   Potassium 3.4 (*)    CO2 21 (*)    Calcium 8.8 (*)    All other components within normal limits  URINALYSIS, ROUTINE W REFLEX MICROSCOPIC - Abnormal; Notable for the following components:   Hgb urine dipstick SMALL (*)    Leukocytes,Ua SMALL (*)    All other components within normal limits  URINE CULTURE  CBC WITH DIFFERENTIAL/PLATELET                                                                                                                          Radiology CT Head Wo Contrast Result Date: 01/13/2024 CLINICAL DATA:  Delirium memory loss, on Wednesday and couldn't remember where her daughter had lived. Pt states she started a new med Celexa (2/14). Seen at UC earlier today and sent here. Denies any weakness or pain. + legs feel heavy EXAM: CT HEAD WITHOUT CONTRAST TECHNIQUE: Contiguous axial images were obtained from the base of the skull through the vertex without intravenous contrast. RADIATION DOSE REDUCTION: This exam was performed according to the departmental dose-optimization program which includes automated exposure control, adjustment of the mA and/or kV according to patient size and/or use of iterative reconstruction technique. COMPARISON:  CT head 05/24/2017 FINDINGS: Brain: No evidence of large-territorial  acute infarction. No parenchymal hemorrhage. No mass lesion. No extra-axial collection. No mass effect or midline shift. No hydrocephalus. Basilar cisterns are patent. Vascular: No hyperdense vessel. Atherosclerotic calcifications are present within the cavernous internal carotid arteries. Skull: No acute fracture or focal lesion. Sinuses/Orbits: Left sphenoid sinus mucosal  thickening with associated sphenoid sinus wall hypertrophy. Paranasal sinuses and mastoid air cells are clear. The orbits are unremarkable. Other: None. IMPRESSION: 1. No acute intracranial abnormality. 2. Chronic left sphenoid sinusitis. Electronically Signed   By: Tish Frederickson M.D.   On: 01/13/2024 20:26    Pertinent labs & imaging results that were available during my care of the patient were reviewed by me and considered in my medical decision making (see MDM for details).  Medications Ordered in ED Medications  fosfomycin (MONUROL) packet 3 g (3 g Oral Given 01/13/24 2029)                                                                                                                                     Procedures Procedures  (including critical care time)  Medical Decision Making / ED Course   This patient presents to the ED for concern of memory loss, this involves an extensive number of treatment options, and is a complaint that carries with it a high risk of complications and morbidity.  The differential diagnosis includes dementia, toxic encephalopathy, metabolic encephalopathy, TIA, mass, ICH  MDM: Patient seen emergency room for evaluation of transient memory loss.  Physical exam is unremarkable with no focal motor or sensory deficits.  No cranial nerve deficits.  Laboratory evaluation with a potassium of 3.4 but is otherwise unremarkable.  Urinalysis with small leuk esterase, 11-20 white blood cells but no bacteria.  May be secondary to her interstitial cystitis but we will cover with fosfomycin and monitor for  improvement at home.  Urine culture sent.  CT head is unremarkable.  With a normal neurologic exam here in the emergency department and sole neurologic issue is transient memory loss, this is not consistent with TIA and emergency MRI is not indicated in the ER today.  Will place urgent outpatient follow-up with neurology and patient will call for an appointment on Monday.  At this time she does not meet inpatient criteria for admission and will be discharged with outpatient follow-up   Additional history obtained: -Additional history obtained from daughter -External records from outside source obtained and reviewed including: Chart review including previous notes, labs, imaging, consultation notes   Lab Tests: -I ordered, reviewed, and interpreted labs.   The pertinent results include:   Labs Reviewed  BASIC METABOLIC PANEL - Abnormal; Notable for the following components:      Result Value   Potassium 3.4 (*)    CO2 21 (*)    Calcium 8.8 (*)    All other components within normal limits  URINALYSIS, ROUTINE W REFLEX MICROSCOPIC - Abnormal; Notable for the following components:   Hgb urine dipstick SMALL (*)    Leukocytes,Ua SMALL (*)    All other components within normal limits  URINE CULTURE  CBC WITH DIFFERENTIAL/PLATELET      EKG   EKG Interpretation Date/Time:  Friday January 13 2024 19:20:16 EDT Ventricular Rate:  79 PR Interval:  151 QRS Duration:  81 QT Interval:  379 QTC Calculation: 435 R Axis:   75  Text Interpretation: Sinus rhythm RSR' in V1 or V2, right VCD or RVH No significant change since prior 7/23 Confirmed by Meridee Score [phone]) on 01/14/2024 10:39:05 AM         Imaging Studies ordered: I ordered imaging studies including CT head I independently visualized and interpreted imaging. I agree with the radiologist interpretation   Medicines ordered and prescription drug management: Meds ordered this encounter  Medications   fosfomycin (MONUROL)  packet 3 g    -I have reviewed the patients home medicines and have made adjustments as needed  Critical interventions none   Cardiac Monitoring: The patient was maintained on a cardiac monitor.  I personally viewed and interpreted the cardiac monitored which showed an underlying rhythm of: NSR  Social Determinants of Health:  Factors impacting patients care include: none   Reevaluation: After the interventions noted above, I reevaluated the patient and found that they have :improved  Co morbidities that complicate the patient evaluation  Past Medical History:  Diagnosis Date   Anxiety    Arthritis    back, neck   Chronic back pain    COPD (chronic obstructive pulmonary disease) (HCC) 2021   after Covid   Depression    Dyspnea    Since covid , NOW ON INHALERS   Fatty liver    Fibromyalgia    GERD (gastroesophageal reflux disease)    Heart murmur    Hematuria    History of adenomatous polyp of colon    History of COVID-19 10/2020   History of kidney stones    History of seizure    x1 2007 post op (per pt neurologist work-up done ?mixture of anesthesia medication and prozac that pt had taken)  and has not any issues since    History of squamous cell carcinoma excision    Hyperlipidemia    Hypertension    Hypothyroidism    IC (interstitial cystitis)    Long COVID    Migraine    Osteoporosis    Pulmonary nodules    multiple per ct -- monitored by pcp   Right ureteral stone    RLS (restless legs syndrome)       Dispostion: I considered admission for this patient, but at this time she does not meet inpatient criteria for admission and will be discharged with outpatient follow-up     Final Clinical Impression(s) / ED Diagnoses Final diagnoses:  Transient alteration of awareness  Memory difficulties     @PCDICTATION @    Yoselyn Mcglade, Wyn Forster, MD 01/14/24 1339

## 2024-01-14 NOTE — ED Provider Notes (Signed)
 [facility]ARE [facility]ENTER   [id number] 01/13/24 Arrival Time: 1541  ASSESSMENT & PLAN:  1. Memory changes    Normal neurological exam. Cannot explain transient memory issues. Cannot r/o TIA. Discussed. To ED for further evaluation by POV; stable upon discharge.  Recommend:  Follow-up Information     Go to  [facility] Emergency Department at [facility].   Specialty: Emergency Medicine Contact information: [address] [place] [address] [phone]                 Reviewed expectations re: course of current medical issues. Questions answered. Outlined signs and symptoms indicating need for more acute intervention. Patient verbalized understanding. After Visit Summary given.   SUBJECTIVE: History from: Patient. Patient is able to give a clear and coherent history.  Alison Brooks is a 68 y.o. female who presents with complaint of a memory problems. Reports memory loss yesterday and today  Could not remember how to get to places she is familiar with. She was recently started on Celexa 2/14 and thought this could be causing her symptoms. PCP directed her here for evaluation.  States she feels fine now "but my memory is not back to normal". Denies headaches or numbness or weakness.  States she has noticed her legs feel heavier than usual. Denies extremity sensation changes or weakness.  Normal bowel/bladder habits.  No h/o similar symptoms. Denies head injury.  OBJECTIVE:  Vitals:   01/13/24 1547  BP: 124/73  Pulse: 72  Resp: 18  Temp: 98.9 F (37.2 C)  TempSrc: Oral  SpO2: 97%    General appearance: alert; NAD HENT: normocephalic; atraumatic Eyes: PERRLA; EOMI; conjunctivae normal Neck: supple with FROM Lungs: clear to auscultation bilaterally; unlabored respirations Heart: regular rate and rhythm Extremities: no edema; symmetrical with no gross deformities Skin: warm and dry Neurologic: alert; speech is fluent and clear  without dysarthria or aphasia; CN 2-12 grossly intact; no facial droop; normal gait; normal symmetric reflexes; normal extremity strength and sensation throughout; bilateral upper and lower extremity sensation is grossly intact with 5/5 symmetric strength; normal grip strength Psychological: alert and cooperative; normal mood and affect  Labs: Results for orders placed or performed during the hospital encounter of 01/13/24  POCT CBG (manual entry)   Collection Time: 01/13/24  4:02 PM  Result Value Ref Range   POCT Glucose ([facility]) 91 70 - 99 mg/dL   Labs Reviewed  POCT FASTING CBG [facility] MANUAL ENTRY - Normal  POCT URINALYSIS DIP (MANUAL ENTRY)    Allergies  Allergen Reactions   Latex Other (See Comments)    blisters   Zofran [Ondansetron Hcl] Nausea And Vomiting and Other (See Comments)    headache    Past Medical History:  Diagnosis Date   Anxiety    Arthritis    back, neck   Chronic back pain    COPD (chronic obstructive pulmonary disease) (HCC) 2021   after Covid   Depression    Dyspnea    Since covid , NOW ON INHALERS   Fatty liver    Fibromyalgia    GERD (gastroesophageal reflux disease)    Heart murmur    Hematuria    History of adenomatous polyp of colon    History of COVID-19 10/2020   History of kidney stones    History of seizure    x1 2007 post op (per pt neurologist work-up done ?mixture of anesthesia medication and prozac that pt had taken)  and has not any issues  since    History of squamous cell carcinoma excision    Hyperlipidemia    Hypertension    Hypothyroidism    IC (interstitial cystitis)    Long COVID    Migraine    Osteoporosis    Pulmonary nodules    multiple per ct -- monitored by pcp   Right ureteral stone    RLS (restless legs syndrome)    Social History   Socioeconomic History   Marital status: Married    Spouse name: Hessie Diener   Number of children: 1   Years of education: [employer] education level: Associate degree:  occupational, Scientist, product/process development, or vocational program  Occupational History   Occupation: Retired Engineer, civil (consulting)  Tobacco Use   Smoking status: Former    Current packs/day: 0.00    Average packs/day: 1.3 packs/day for 40.0 years (50.0 ttl pk-yrs)    Types: Cigarettes    Start date: 06/07/1968    Quit date: 06/07/2008    Years since quitting: 15.6   Smokeless tobacco: Never  Vaping Use   Vaping status: Never Used  Substance and Sexual Activity   Alcohol use: Yes    Alcohol/week: 0.0 standard drinks of alcohol    Comment: VERY RARE   Drug use: No   Sexual activity: Not on file  Other Topics Concern   Not on file  Social History Narrative   Lives with spouse, Hessie Diener.    Caffeine use: 1-2 cups coffee per day   Married 47 years in December 2020. 1 daughter. 2 grandkids (boy and girl). Lives 10 miles away.       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       Hobbies: previously enjoyed dancing, old car shoes (66 chevy), reading   Social Drivers of Corporate investment banker Strain: Low Risk  (10/24/2023)   Overall Financial Resource Strain (CARDIA)    Difficulty of Paying Living Expenses: Not hard at all  Food Insecurity: No Food Insecurity (10/24/2023)   Hunger Vital Sign    Worried About Running Out of Food in the Last Year: Never true    Ran Out of Food in the Last Year: Never true  Transportation Needs: No Transportation Needs (10/24/2023)   PRAPARE - Administrator, Civil Service (Medical): No    Lack of Transportation (Non-Medical): No  Physical Activity: Inactive (10/24/2023)   Exercise Vital Sign    Days of Exercise per Week: 0 days    Minutes of Exercise per Session: 10 min  Stress: Stress Concern Present (10/24/2023)   [employer] of Occupational Health - Occupational Stress Questionnaire    Feeling of Stress : To some extent  Social Connections: Moderately Integrated (10/24/2023)   Social Connection and Isolation Panel [NHANES]    Frequency of Communication  with Friends and Family: Three times a week    Frequency of Social Gatherings with Friends and Family: Once a week    Attends Religious Services: 1 to 4 times per year    Active Member of [employer] or Organizations: No    Attends Banker Meetings: Never    Marital Status: Married  Catering manager Violence: Not At Risk (07/26/2023)   Humiliation, Afraid, Rape, and Kick questionnaire    Fear of Current or Ex-Partner: No    Emotionally Abused: No    Physically Abused: No    Sexually Abused: No   Family History  Problem Relation Age of Onset   Heart attack Mother  Mom 46, Dad 20   Hypertension Mother    Heart disease Mother    Heart disease Father    Diabetes Father    Colon cancer Maternal Grandmother        not sure age of onset   Esophageal cancer Neg Hx    Rectal cancer Neg Hx    Stomach cancer Neg Hx    Past Surgical History:  Procedure Laterality Date   CARDIOVASCULAR STRESS TEST  09/02/2004   normal perfusion study/  normal LV function and wall motion, ef 64%   CARPAL TUNNEL RELEASE Right 1998   CERVICAL FUSION  1992   CHOLECYSTECTOMY  1995   COLONOSCOPY     CYSTO/  BOTOX INJECTION  12-27-2008  &  04-01-2006   CYSTO/  HYDRODISTENTION/  INSTILLATION THERAPY  03/04/2006   CYSTOSCOPY W/ URETERAL STENT PLACEMENT Right 04/02/2016   Procedure: CYSTOSCOPY WITH RIGHT RETROGRADE, URETEROSCOPY PYELOGRAM LASER LITHOTRIPSY,/URETERAL STENT PLACEMENT;  Surgeon: Ihor Gully, MD;  [facility];  Service: Urology;  Laterality: Right;   HEMORRHOID SURGERY  01/03/2004   HOLMIUM LASER APPLICATION Right 04/02/2016   Procedure: HOLMIUM LASER APPLICATION;  Surgeon: Ihor Gully, MD;  Location[facility];  Service: Urology;  Laterality: Right;   LAPAROSCOPIC RIGHT HEMI COLECTOMY N/A 06/12/2021   Procedure: LAPAROSCOPIC RIGHT HEMI COLECTOMY;  Surgeon: Andria Meuse, MD;  Location: [facility];  Service: General;  Laterality: N/A;    LUMBAR FUSION  1996   L4 --S1   ROTATOR CUFF REPAIR Left 2004   SHOULDER ARTHROSCOPY WITH BICEPS TENDON REPAIR Left 12/2015   SPINAL CORD STIMULATOR IMPLANT  x4  last one 2013   STONE EXTRACTION WITH BASKET Right 04/02/2016   Procedure: STONE EXTRACTION WITH BASKET;  Surgeon: Ihor Gully, MD;  Location: [facility];  Service: Urology;  Laterality: Right;   TONSILLECTOMY  1964   TOTAL HIP ARTHROPLASTY Right 05/18/2022   Procedure: TOTAL HIP ARTHROPLASTY ANTERIOR APPROACH;  Surgeon: Sheral Apley, MD;  Location: [facility];  Service: Orthopedics;  Laterality: Right;   TRANSTHORACIC ECHOCARDIOGRAM  04/17/2014   EF 60-65%/  trivial AR, MR, and TR   UPPER GASTROINTESTINAL ENDOSCOPY     VAGINAL HYSTERECTOMY  1984   WRIST GANGLION EXCISION Right 02/05/2004      Mardella Layman, MD 01/14/24 1007

## 2024-01-16 ENCOUNTER — Telehealth: Payer: Self-pay | Admitting: Neurology

## 2024-01-16 NOTE — Telephone Encounter (Signed)
 That's fine. thanks

## 2024-01-16 NOTE — Telephone Encounter (Signed)
 Patient has been referred back to [place] for memory difficulties by Titus Dubin seen and evaluated there on 3/21. She saw Dr. Lucia Gaskins previously for headaches/vertigo (back in 2018), but is now requesting to see Dr. Vickey Huger. Would you both be ok with this?

## 2024-01-19 ENCOUNTER — Encounter: Payer: Self-pay | Admitting: Neurology

## 2024-01-19 ENCOUNTER — Ambulatory Visit: Admitting: Neurology

## 2024-01-19 VITALS — BP 102/55 | HR 51 | Ht 61.0 in | Wt 139.0 lb

## 2024-01-19 DIAGNOSIS — R4189 Other symptoms and signs involving cognitive functions and awareness: Secondary | ICD-10-CM | POA: Insufficient documentation

## 2024-01-19 DIAGNOSIS — G3184 Mild cognitive impairment, so stated: Secondary | ICD-10-CM | POA: Diagnosis not present

## 2024-01-19 MED ORDER — DONEPEZIL HCL 5 MG PO TABS: 5.0000 mg | ORAL_TABLET | Freq: Every day | ORAL | 0 refills | Status: DC

## 2024-01-19 NOTE — Addendum Note (Signed)
 Addended by: Melvyn Novas on: 01/19/2024 04:30 PM   Modules accepted: Orders

## 2024-01-19 NOTE — Patient Instructions (Addendum)
 Total time for face to face interview and examination, for review of  images and laboratory testing, neurophysiology testing and pre-existing records, including out-of -network , was 45 minutes. 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 MOCA had to be interrupted  as she failed drawing and trail making test.  2)  though processes are disorganized, interrupted , and easily distracted, and amnestic - and socially reclused in reaction to this. She denies depression.  Has not had learning disabilities,  She graduated from HS  by GED- without trouble.  Nursing school at  age 57-40, graduated.      Plan:  Treatment plan and additional workup planned after today includes:    1)  MRI brain not possible due to spinal cord stimulator.    2)   PET metabolic or amyloid after return of results of  dementia panel.  ANT, Apolipoprotein -    RV in 4 months  with MMSE.   Neuropsychological testing.  \  INTERPRETING PHYSICIAN:   Melvyn Novas, MD  [facility] Sleep Board certified by The [employer] of Sleep Medicine and Diplomate of the [employer] of Sleep Medicine. Board certified In Neurology through the ABPN, Fellow of the [employer] of Neurology.     Problems With Thinking and Memory (Mild Neurocognitive Disorder): What to Know Mild neurocognitive disorder, formerly known as mild cognitive impairment, is a disorder where your memory doesn't work as well as it should. It may also affect other mental abilities like thinking, communicating, behavior, and being able to finish tasks. These problems can be noticed and measured. But they usually don't stop you from doing daily activities or living on your own. Mild neurocognitive disorder usually happens after 70 years of age. But it can also happen at younger ages. It's not as serious as major neurocognitive disorder, also known as  dementia, but it may be the first sign of it. In general, the symptoms of this condition get worse over time. In rare cases, symptoms can get better. What are the causes? This condition may be caused by: Brain disorders like Alzheimer's disease, Parkinson's disease, and other conditions that slowly damage nerve cells. Diseases that affect the blood vessels in the brain and cause small strokes. Certain infections, like HIV. Traumatic brain injury. Other medical conditions, such as brain tumors, underactive thyroid (hypothyroidism), and not having enough vitamin B12. Using certain drugs or medicines. What increases the risk? Being older than 70 years of age. Being female. Having a lower level of education. Diabetes, high blood pressure, high cholesterol, and other conditions that raise the risk for blood vessel diseases. Untreated or undertreated sleep apnea. Having a certain type of gene that can be inherited, or passed down from parent to child. Long-term health problems like heart disease, lung disease, liver disease, kidney disease, or depression. What are the signs or symptoms? Trouble remembering things. You may: Forget names, phone numbers, or details of recent events. Forget about social events and appointments. Often forget where you put your car keys or other items. Trouble thinking and solving problems. You may have trouble with complex tasks like: Paying bills. Driving in places you don't know well. Trouble communicating. You may have trouble: Finding the right word or naming an object. Forming a sentence that makes sense. Understanding what you read or hear. Changes in your behavior or personality. When this happens, you may: Lose  interest in the things you used to enjoy. Avoid being around people. Get angry more easily than usual. Act before thinking. How is this diagnosed? This condition is diagnosed based on: Your symptoms. Your health care provider may ask you and the  people you spend time with, like family and friends, about your symptoms. Memory tests and other tests to check how your brain is working. Your provider may refer you to a provider called a neurologist or a mental health specialist. To try to find out the cause of your condition, your provider may: Get a detailed medical history. Ask about use of alcohol, drugs, and medicines. Do a physical exam. Order blood tests and brain imaging tests. How is this treated? Mild neurocognitive disorder that's caused by medicine use, drug use, infection, or another medical condition may get better when the cause is treated, or when medicines or drugs are stopped. If this disorder has another cause, it usually doesn't improve and may get worse. In these cases, the goal of treatment is to help you manage the symptoms. This may include: Medicines to help with memory and behavior symptoms. Talk therapy. This provides education, emotional support, memory aids, and other ways of making up for problems with mental tasks. Lifestyle changes. These may include: Getting regular exercise. Eating a healthy diet that includes omega-3 fatty acids. Doing things to challenge your thinking and memory skills. Spending more time being with and talking to other people. Using routines like having regular times for meals and going to bed. Follow these instructions at home: Eating and drinking  Drink more fluids as told. Eat a healthy diet that includes omega-3 fatty acids. These can be found in: Fish. Nuts. Leafy vegetables. Vegetable oils. If you drink alcohol: Limit how much you have to: 0-1 drink a day if you're female. 0-2 drinks a day if you're female. Know how much alcohol is in your drink. In the [place] one drink is one 12 oz bottle of beer (355 mL), one 5 oz glass of wine (148 mL), or one 1 oz glass of hard liquor (44 mL). Lifestyle  Get regular exercise as told by your provider. Do not smoke, vape, or use  nicotine or tobacco. Use healthy ways to manage stress. If you need help managing stress, ask your provider. Keep spending time with other people. Keep your mind active by doing activities you enjoy, like reading or playing games. Make sure you get good sleep at night. These tips can help: Try not to take naps during the day. Keep your bedroom dark and cool. Do not exercise in the few hours before you go to bed. Do not have foods or drinks with caffeine at night. General instructions Take medicines only as told. Your provider may tell you to avoid taking medicines that can affect thinking. These include some medicines for pain or sleeping. Work with your provider to find out: What things you need help with. What your safety needs are. Where to find more information [employer] on Aging: [email or web address] Contact a health care provider if: You have any new symptoms. Get help right away if: You have new confusion or your confusion gets worse. You act in ways that put you or your family in danger. This information is not intended to replace advice given to you by your health care provider. Make sure you discuss any questions you have with your health care provider. Document Revised: 04/05/2023 Document Reviewed: 04/05/2023 Elsevier Patient Education  2024 [employer].

## 2024-01-19 NOTE — Progress Notes (Addendum)
 [facility]  Provider:  Dr Talley Casco Referring Provider: Glendora Score, MD Primary Care Physician:  Shelva Majestic, MD  Chief Complaint  Patient presents with   New Patient (Initial Visit)    Patient in room # 2 and alone. Patient states she here to discuss her memory issues.    HPI:  Alison Brooks is a 68 y.o. female and seen here upon referral from Dr. Posey Rea for a Consultation/ Evaluation of memory loss. 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  This I will need to dominate the past medical history for this patient but she described a rather rapid progression of events indicating amnestic memory loss.  She described how she misplaced her checkbook even that she had just used it within days and found it in a drawer of his clothing but only did not belong.  She was at home and thought about getting her granddaughter picking her up from the house but could not fall them the location she had in mind how the house would look where she picked where she would pick her up but she could not mentally find the way and thank goodness on that day she did not have to drive.  She states that she was in 1 of 2 [facility]'s close to her hometown but forgot on which 1 in which 1 she was and how to get home from there.  She describes these as very sudden events 1 day she feels clear the next totally disoriented and in no way helpless.  A friend of hers had been in the nursing home for rehab and she forgot how to get there even that this way would be some past she has been many times taken before.  For her past medical history she has been treated elsewhere for obstructive sleep apnea is on CPAP, she has a history longstanding of chronic pain syndromes had been on baclofen and had a spinal cord stimulator placed which is no longer working, had briefly seen Dr. Daisy Blossom.  She also has a history of OSA overlap with COPD,  pulmonary nodules, was a smoker,  depression,  migraine 9 now rare ).,  fibromyalgia,  Kidney stones,  HTN, high Cholesterol.    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 No family history of dementia, many died young of CAD-    Review of her medication and her recent imaging studies: The patient stated that she rarely will take baclofen now she does not need it anymore, she has been placed on Protonix for acid reflux and vitamin D 12 treatments, topiramate 200 mg daily which also is a seizure medicine, she has been on Spiriva for COPD on Crestor for high cholesterol on Synthroid 550 mcg for hypothyroidism, Pepcid, Zetia, Celexa Norvasc, she also has a Ventolin inhaler albuterol and sometimes will take time Tylenol.   COMPARISON:  CT head 05/24/2017   FINDINGS: Brain:   No evidence of large-territorial acute infarction. No parenchymal hemorrhage. No mass lesion. No extra-axial collection.   No mass effect or midline shift. No hydrocephalus. Basilar cisterns are patent.   Vascular: No hyperdense vessel. Atherosclerotic calcifications are present within the cavernous internal carotid arteries.   Skull: No acute fracture or focal lesion.   Sinuses/Orbits: Left sphenoid sinus mucosal thickening with associated sphenoid sinus wall hypertrophy. Paranasal sinuses and mastoid air cells are clear. The orbits are unremarkable.   Other: None.   IMPRESSION: 1.  No acute intracranial abnormality. 2. Chronic left sphenoid sinusitis.        She was seen at [facility] in 2018 by Dr Lucia Gaskins : [name] is a 69 y.o. female here as a referral from Dr. Durene Cal for neck pain. HTN, HLD, Depression, Anxiety, Fibromyalgia. 6 months or a year ago she started having dizziness. She has a hx of meniere's disease but this is different. She can be sitting and watching TV and her eyes will start rolling, she has blurred vision and dizzy, not associated with headache or vision changes. She sometimes walks and  gets lightheaded and dizzy. She has the episodes daily. It happened when she was driving and she felt the blurred vision and dizziness, both eyes affected, spinning sensation and blurry vision. Feels like vaseline in her eyes. Her balance is affected during the symptoms. Lasts 15-20 minutes and slowly resolves. She has persistent dizziness coming and going which sometimes is not associated with the vision changes and can last all day. Feels like she is on a boat and feels imbalanced during the episodes. Progressively worsening. She has a hx of migraine headaches over 30 years ago but not in recent years. She will get auras with sparkles and lines coming across her vision and vision changes. She has had more headaches recently but they are from the neck when she is looking down and she has shooting pain in the back of the head and a headache in the back of the head which triggers the dizziness. Nothing makes it better it just has to run its course. Happens every day. No other focal neurologic deficits, associated symptoms, inciting events or modifiable factors.   Assessment/Plan:  68 year old female here for dizziness, vertigo, blurred vision and neck pain. She has a history of migraines and auras, may be vestibular migraines but need to rule out all other causes.   - CT of the head, CTA head and neck for evaluation of vision changes, headaches, dizziness for lesions, masses or vascular causes such as thromboembolic cerebrovascular disease or dissection - Vestibular therapy - If she is not improved by PT and no other etiology found may consider treating for vestibular migraines - check bmp today   Review of Systems: Out of a complete 14 system review, the patient complains of only the following symptoms, and all other reviewed systems are negative.  Depression, chronic pain,  and Memory Loss.       01/19/2024    3:33 PM  MMSE - Mini Mental State Exam  Orientation to time 3  Orientation to Place 5   Registration 3  Attention/ Calculation 1  Recall 1  Language- name 2 objects 2  Language- repeat 0  Language- follow 3 step command 3  Language- read & follow direction 1  Write a sentence 1  Copy design 0  Total score 20      Social History   Socioeconomic History   Marital status: Married    Spouse name: Hessie Diener   Number of children: 1   Years of education: [employer] education level: Associate degree: occupational, Scientist, product/process development, or vocational program  Occupational History   Occupation: Retired Engineer, civil (consulting)  Tobacco Use   Smoking status: Former    Current packs/day: 0.00    Average packs/day: 1.3 packs/day for 40.0 years (50.0 ttl pk-yrs)    Types: Cigarettes    Start date: 06/07/1968    Quit date: 06/07/2008    Years since quitting: 15.6   Smokeless  tobacco: Never  Vaping Use   Vaping status: Never Used  Substance and Sexual Activity   Alcohol use: Yes    Alcohol/week: 0.0 standard drinks of alcohol    Comment: VERY RARE   Drug use: No   Sexual activity: Not on file  Other Topics Concern   Not on file  Social History Narrative   Lives with spouse, Hessie Diener.    Caffeine use: 1-2 cups coffee per day   Married 47 years in December 2020. 1 daughter. 2 grandkids (boy and girl). Lives 10 miles away.       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       Hobbies: previously enjoyed dancing, old car shoes (4 chevy), reading   Social Drivers of Corporate investment banker Strain: Low Risk  (10/24/2023)   Overall Financial Resource Strain (CARDIA)    Difficulty of Paying Living Expenses: Not hard at all  Food Insecurity: No Food Insecurity (10/24/2023)   Hunger Vital Sign    Worried About Running Out of Food in the Last Year: Never true    Ran Out of Food in the Last Year: Never true  Transportation Needs: No Transportation Needs (10/24/2023)   PRAPARE - Administrator, Civil Service (Medical): No    Lack of Transportation (Non-Medical): No  Physical  Activity: Inactive (10/24/2023)   Exercise Vital Sign    Days of Exercise per Week: 0 days    Minutes of Exercise per Session: 10 min  Stress: Stress Concern Present (10/24/2023)   [employer] of Occupational Health - Occupational Stress Questionnaire    Feeling of Stress : To some extent  Social Connections: Moderately Integrated (10/24/2023)   Social Connection and Isolation Panel [NHANES]    Frequency of Communication with Friends and Family: Three times a week    Frequency of Social Gatherings with Friends and Family: Once a week    Attends Religious Services: 1 to 4 times per year    Active Member of [employer] or Organizations: No    Attends Banker Meetings: Never    Marital Status: Married  Catering manager Violence: Not At Risk (07/26/2023)   Humiliation, Afraid, Rape, and Kick questionnaire    Fear of Current or Ex-Partner: No    Emotionally Abused: No    Physically Abused: No    Sexually Abused: No    Family History  Problem Relation Age of Onset   Heart attack Mother        Mom 83, Dad 48   Hypertension Mother    Heart disease Mother    Heart disease Father    Diabetes Father    Colon cancer Maternal Grandmother        not sure age of onset   Esophageal cancer Neg Hx    Rectal cancer Neg Hx    Stomach cancer Neg Hx     Past Medical History:  Diagnosis Date   Anxiety    Arthritis    back, neck   Chronic back pain    COPD (chronic obstructive pulmonary disease) (HCC) 2021   after Covid   Depression    Dyspnea    Since covid , NOW ON INHALERS   Fatty liver    Fibromyalgia    GERD (gastroesophageal reflux disease)    Heart murmur    Hematuria    History of adenomatous polyp of colon    History of COVID-19 10/2020   History of kidney stones  History of seizure    x1 2007 post op (per pt neurologist work-up done ?mixture of anesthesia medication and prozac that pt had taken)  and has not any issues since    History of squamous cell  carcinoma excision    Hyperlipidemia    Hypertension    Hypothyroidism    IC (interstitial cystitis)    Long COVID    Migraine    Osteoporosis    Pulmonary nodules    multiple per ct -- monitored by pcp   Right ureteral stone    RLS (restless legs syndrome)     Past Surgical History:  Procedure Laterality Date   CARDIOVASCULAR STRESS TEST  09/02/2004   normal perfusion study/  normal LV function and wall motion, ef 64%   CARPAL TUNNEL RELEASE Right 1998   CERVICAL FUSION  1992   CHOLECYSTECTOMY  1995   COLONOSCOPY     CYSTO/  BOTOX INJECTION  12-27-2008  &  04-01-2006   CYSTO/  HYDRODISTENTION/  INSTILLATION THERAPY  03/04/2006   CYSTOSCOPY W/ URETERAL STENT PLACEMENT Right 04/02/2016   Procedure: CYSTOSCOPY WITH RIGHT RETROGRADE, URETEROSCOPY PYELOGRAM LASER LITHOTRIPSY,/URETERAL STENT PLACEMENT;  Surgeon: Ihor Gully, MD;  Location: [facility];  Service: Urology;  Laterality: Right;   HEMORRHOID SURGERY  01/03/2004   HOLMIUM LASER APPLICATION Right 04/02/2016   Procedure: HOLMIUM LASER APPLICATION;  Surgeon: Ihor Gully, MD;  [facility];  Service: Urology;  Laterality: Right;   LAPAROSCOPIC RIGHT HEMI COLECTOMY N/A 06/12/2021   Procedure: LAPAROSCOPIC RIGHT HEMI COLECTOMY;  Surgeon: Andria Meuse, MD;  Location: [facility];  Service: General;  Laterality: N/A;   LUMBAR FUSION  1996   L4 --S1   ROTATOR CUFF REPAIR Left 2004   SHOULDER ARTHROSCOPY WITH BICEPS TENDON REPAIR Left 12/2015   SPINAL CORD STIMULATOR IMPLANT  x4  last one 2013   STONE EXTRACTION WITH BASKET Right 04/02/2016   Procedure: STONE EXTRACTION WITH BASKET;  Surgeon: Ihor Gully, MD;  Location: [facility];  Service: Urology;  Laterality: Right;   TONSILLECTOMY  1964   TOTAL HIP ARTHROPLASTY Right 05/18/2022   Procedure: TOTAL HIP ARTHROPLASTY ANTERIOR APPROACH;  Surgeon: Sheral Apley, MD;  Location: [facility];  Service: Orthopedics;   Laterality: Right;   TRANSTHORACIC ECHOCARDIOGRAM  04/17/2014   EF 60-65%/  trivial AR, MR, and TR   UPPER GASTROINTESTINAL ENDOSCOPY     VAGINAL HYSTERECTOMY  1984   WRIST GANGLION EXCISION Right 02/05/2004    Current Outpatient Medications  Medication Sig Dispense Refill   acetaminophen (TYLENOL) 500 MG tablet Take 2 tablets (1,000 mg total) by mouth every 6 (six) hours as needed for moderate pain or mild pain. 60 tablet 0   albuterol (VENTOLIN HFA) 108 (90 Base) MCG/ACT inhaler Inhale 2 puffs into the lungs every 6 (six) hours as needed. 18 g 5   allopurinol (ZYLOPRIM) 100 MG tablet TAKE 1 TABLET BY MOUTH DAILY 100 tablet 2   amLODipine (NORVASC) 2.5 MG tablet TAKE 1 TABLET BY MOUTH  DAILY 90 tablet 3   baclofen (LIORESAL) 10 MG tablet Take 10 mg by mouth 3 (three) times daily. PRN     Cholecalciferol (VITAMIN D) 50 MCG (2000 UT) tablet Take 2,000 Units by mouth daily.     citalopram (CELEXA) 20 MG tablet Take 1 tablet (20 mg total) by mouth daily. 90 tablet 3   cyanocobalamin (VITAMIN B12) 1000 MCG/ML injection 1000 mcg (1 mg) injection once per per month or  as directed 30 mL 1   diclofenac sodium (VOLTAREN) 1 % GEL Apply 4 g topically 4 (four) times daily. (Patient taking differently: Apply 4 g topically 4 (four) times daily as needed (pain).) 100 g 1   ezetimibe (ZETIA) 10 MG tablet TAKE 1 TABLET BY MOUTH DAILY 100 tablet 2   famotidine (PEPCID) 20 MG tablet Take 1 tablet (20 mg total) by mouth at bedtime. OFFICE VISIT FOR FURTHER REFILLS 90 tablet 1   fluticasone (FLONASE) 50 MCG/ACT nasal spray Place 2 sprays into both nostrils daily. 18.2 mL 2   levothyroxine (SYNTHROID, LEVOTHROID) 50 MCG tablet Take 50 mcg by mouth daily before breakfast.     pantoprazole (PROTONIX) 40 MG tablet TAKE 1 TABLET BY MOUTH  TWICE DAILY 30 TO 60  MINUTES BEFORE MEALS OFFICE VISIT FOR FURTHER REFILLS!!!!! 90 tablet 1   rosuvastatin (CRESTOR) 40 MG tablet TAKE 1 TABLET BY MOUTH DAILY 100 tablet 2    Tiotropium Bromide Monohydrate (SPIRIVA RESPIMAT) 2.5 MCG/ACT AERS INHALE 2 PUFFS INTO THE LUNGS ONCE DAILY. 4 g 11   topiramate (TOPAMAX) 200 MG tablet Take 1 tablet (200 mg total) by mouth daily. 90 tablet 3   No current facility-administered medications for this visit.    Allergies as of 01/19/2024 - Review Complete 01/19/2024  Allergen Reaction Noted   Latex Other (See Comments) 06/07/2013   Zofran Frazier Richards hcl] Nausea And Vomiting and Other (See Comments) 03/11/2016    Vitals: BP (!) 102/55 (BP Location: Left Arm, Patient Position: Sitting, Cuff Size: Small)   Pulse (!) 51   Ht 5\' 1"  (1.549 m)   Wt 139 lb (63 kg)   BMI 26.26 kg/m  Last Weight:  Wt Readings from Last 1 Encounters:  01/19/24 139 lb (63 kg)   Last Height:   Ht Readings from Last 1 Encounters:  01/19/24 5\' 1"  (1.549 m)   Last BMI: @LASTBMI  Physical exam:  General: The patient is awake, alert and appears not in acute distress.  The patient is well groomed. Head: Normocephalic, atraumatic.  Neck is supple. No fusion surgery Next goiter   Neck circumference:  14'5'  after fusion.  Cardiovascular:  Regular rate and palpable peripheral pulse:  Respiratory: clear to auscultation Mallampati , Skin:  Without evidence of edema, or rash Trunk: BMI is 26.2   and patient  has normal posture.   Neurologic exam : The patient is awake and alert, oriented to place and time.  Memory subjective  described as impaired . MMSE    01/19/2024    3:33 PM  MMSE - Mini Mental State Exam  Orientation to time 3  Orientation to Place 5  Registration 3  Attention/ Calculation 1  Recall 1  Language- name 2 objects 2  Language- repeat 0  Language- follow 3 step command 3  Language- read & follow direction 1  Write a sentence 1  Copy design 0  Total score 20     There is a reduced  attention span & concentration ability here today,  Speech is fluent with dysphonia, but aphasia when speaking in longer sentences. .    Mood and affect are anxious.   Cranial nerves: Pupils are equal and briskly reactive to light. Funduscopic exam without  evidence of pallor or edema. Extraocular movements  in vertical and horizontal planes intact and without nystagmus. Visual fields by finger perimetry are intact. Hearing to finger rub intact.  Facial sensation intact to fine touch. Facial motor strength is symmetric and tongue and  uvula move midline.  Motor exam:   Normal tone and normal muscle bulk and symmetric normal strength in all extremities. Grip Strength  bilaterally reduced.  Carpal tunnel- hx  Proximal strength of shoulder muscles and hip flexors was intact. .  Sensory:  Fine touch and vibration were tested . Proprioception was tested in the upper extremities only and was  normal.  Coordination: Rapid alternating movements in the fingers/hands were normal.  Finger-to-nose maneuver was tested and showed evidence of dysmetria on the right.   Gait and station: Patient walked without assistive device -  Core Strength within normal limits. Arthralgic.   Deep tendon reflexes: in the  upper and lower extremities are symmetric and  brisk without Clonus. Babinski maneuver response is  downgoing.   Assessment: Total time for face to face interview and examination, for review of  images and laboratory testing, neurophysiology testing and pre-existing records, including out-of -network , was 45 minutes. Assessment is as follows here:  1)   This is not MCI, the result of the MMSE is showing a moderate impairment. MOCA had to be interrupted  as she failed drawig and trail making test.  2)  disorganized, amnestic - and socially reclused in reaction to this. She denies depression.   Plan:  Treatment plan and additional workup planned after today includes:   1)  MRI brain not possible due to spinal cord stimulator.   2)   PET metabolic or amyloid after return of results of  dementia panel.  ANT, Apolipoprotein -  and  started aricept 5 mg a day, 30 days, after that initial starer dose  going to 10 mg daily.   RV in 4 months  with MMSE.   Neuropsychological testing.   NTERPRETING PHYSICIAN:   Melvyn Novas, MD  [facility] and [facility] Sleep Board certified by The [employer] of Sleep Medicine and Diplomate of the [employer] of Sleep Medicine. Board certified In Neurology through the ABPN, Fellow of the [employer] of Neurology.

## 2024-01-23 ENCOUNTER — Telehealth: Payer: Self-pay | Admitting: Family Medicine

## 2024-01-23 NOTE — Telephone Encounter (Signed)
 Spoke w/patients spouse to confirm [facility] appt and he asked that I let Dr Durene Cal know that patient Alison Brooks was recently diagnosed with Alzheimers.  I told him I would send Dr Durene Cal this message.  Thank you Alison Brooks [facility] TEAM Direct Dial [phone]

## 2024-01-26 ENCOUNTER — Encounter: Payer: Self-pay | Admitting: Psychology

## 2024-01-27 LAB — COMPREHENSIVE METABOLIC PANEL WITH GFR
ALT: 34 IU/L — ABNORMAL HIGH (ref 0–32)
AST: 32 IU/L (ref 0–40)
Albumin: 4.2 g/dL (ref 3.9–4.9)
Alkaline Phosphatase: 117 IU/L (ref 44–121)
BUN/Creatinine Ratio: 16 (ref 12–28)
BUN: 10 mg/dL (ref 8–27)
Bilirubin Total: 0.3 mg/dL (ref 0.0–1.2)
CO2: 18 mmol/L — ABNORMAL LOW (ref 20–29)
Calcium: 8.7 mg/dL (ref 8.7–10.3)
Chloride: 111 mmol/L — ABNORMAL HIGH (ref 96–106)
Creatinine, Ser: 0.64 mg/dL (ref 0.57–1.00)
Globulin, Total: 1.9 g/dL (ref 1.5–4.5)
Glucose: 88 mg/dL (ref 70–99)
Potassium: 4.2 mmol/L (ref 3.5–5.2)
Sodium: 143 mmol/L (ref 134–144)
Total Protein: 6.1 g/dL (ref 6.0–8.5)
eGFR: 95 mL/min/{1.73_m2} (ref >59)

## 2024-01-27 LAB — ATN PROFILE
A -- Beta-amyloid 42/40 Ratio: 0.112 (ref >0.102)
Beta-amyloid 40: 205.62 pg/mL
Beta-amyloid 42: 23.09 pg/mL
N -- NfL, Plasma: 3.25 pg/mL (ref 0.00–7.64)
T -- p-tau181: 0.64 pg/mL (ref 0.00–0.97)

## 2024-01-27 LAB — CBC WITH DIFFERENTIAL/PLATELET
Basophils Absolute: 0.1 10*3/uL (ref 0.0–0.2)
Basos: 1 %
EOS (ABSOLUTE): 0.1 10*3/uL (ref 0.0–0.4)
Eos: 2 %
Hematocrit: 41 % (ref 34.0–46.6)
Hemoglobin: 13.5 g/dL (ref 11.1–15.9)
Immature Grans (Abs): 0 10*3/uL (ref 0.0–0.1)
Immature Granulocytes: 0 %
Lymphocytes Absolute: 2.4 10*3/uL (ref 0.7–3.1)
Lymphs: 37 %
MCH: 32.3 pg (ref 26.6–33.0)
MCHC: 32.9 g/dL (ref 31.5–35.7)
MCV: 98 fL — ABNORMAL HIGH (ref 79–97)
Monocytes Absolute: 0.5 10*3/uL (ref 0.1–0.9)
Monocytes: 7 %
Neutrophils Absolute: 3.5 10*3/uL (ref 1.4–7.0)
Neutrophils: 53 %
Platelets: 198 10*3/uL (ref 150–450)
RBC: 4.18 x10E6/uL (ref 3.77–5.28)
RDW: 13 % (ref 11.7–15.4)
WBC: 6.6 10*3/uL (ref 3.4–10.8)

## 2024-01-27 LAB — PROTEIN ELECTROPHORESIS, SERUM
A/G Ratio: 1.3 (ref 0.7–1.7)
Albumin ELP: 3.5 g/dL (ref 2.9–4.4)
Alpha 1: 0.3 g/dL (ref 0.0–0.4)
Alpha 2: 0.8 g/dL (ref 0.4–1.0)
Beta: 0.9 g/dL (ref 0.7–1.3)
Gamma Globulin: 0.6 g/dL (ref 0.4–1.8)
Globulin, Total: 2.6 g/dL (ref 2.2–3.9)
M-Spike, %: 0.2 g/dL — ABNORMAL HIGH

## 2024-01-27 LAB — SEDIMENTATION RATE: Sed Rate: 5 mm/h (ref 0–40)

## 2024-01-27 LAB — TSH+FREE T4
Free T4: 1.03 ng/dL (ref 0.82–1.77)
TSH: 1.73 u[IU]/mL (ref 0.450–4.500)

## 2024-01-27 LAB — APOE ALZHEIMER'S RISK

## 2024-01-27 LAB — VITAMIN B12: Vitamin B-12: 250 pg/mL (ref 232–1245)

## 2024-01-27 LAB — ANA W/REFLEX: Anti Nuclear Antibody (ANA): NEGATIVE

## 2024-01-31 ENCOUNTER — Encounter: Payer: Self-pay | Admitting: Neurology

## 2024-01-31 DIAGNOSIS — D485 Neoplasm of uncertain behavior of skin: Secondary | ICD-10-CM | POA: Diagnosis not present

## 2024-01-31 DIAGNOSIS — L820 Inflamed seborrheic keratosis: Secondary | ICD-10-CM | POA: Diagnosis not present

## 2024-01-31 DIAGNOSIS — L988 Other specified disorders of the skin and subcutaneous tissue: Secondary | ICD-10-CM | POA: Diagnosis not present

## 2024-02-20 ENCOUNTER — Telehealth: Payer: Self-pay | Admitting: Neurology

## 2024-02-20 NOTE — Telephone Encounter (Signed)
 Pt said, let Dr. Albertina Hugger know how done on the small dosage of donepezil  (ARICEPT ) 5 MG tablet. Can not see a different, want to know if Dr. Albertina Hugger want to increase the dosage. Would like a call back.

## 2024-02-20 NOTE — Telephone Encounter (Signed)
 Spoke w/Pt who stated she has not noticed a difference with donepezil  5 mg. Pt stated she is still having trouble with recall and that she is unable to tell me what she did yesterday without some clues. Informed Pt will make Dr aware. Pt voiced thanks for the call.

## 2024-02-22 NOTE — Telephone Encounter (Signed)
 Pt has called to f/u on a medication with RN

## 2024-02-22 NOTE — Telephone Encounter (Signed)
 Pt called to check on whether she needs to increase donepezil . Informed Pt a message was sent to MD and we are waiting on her decision. Also made Pt aware MD is out of the office today and we will contact her as soon as MD makes a decision for medication. Pt stated understanding.

## 2024-02-27 MED ORDER — DONEPEZIL HCL 10 MG PO TABS: 10.0000 mg | ORAL_TABLET | Freq: Every day | ORAL | 5 refills | Status: DC

## 2024-02-27 NOTE — Addendum Note (Signed)
 Addended by: Treyten Monestime K on: 02/27/2024 02:16 PM   Modules accepted: Orders

## 2024-02-27 NOTE — Addendum Note (Signed)
 Addended by: Neomia Banner on: 02/27/2024 01:17 PM   Modules accepted: Orders

## 2024-02-27 NOTE — Telephone Encounter (Signed)
 Spoke w/Pt regarding medication dosage increase. Informed Pt MD does want her to increase the donepezil  to 10mg . Pt stated she needs the script sent to [facility]. Verified it was sent to [facility] but we will get it sent to [facility] at Pt request. Pt stated understanding.

## 2024-02-27 NOTE — Telephone Encounter (Signed)
 Yes, she needs to go to 10 mg daily after 30 days on 5 mg ( if she tolerated Aricept  well). That was spelled out in my visit note.   Is she using pharmacy of record ?  Neomia Banner, MD

## 2024-03-01 ENCOUNTER — Encounter (HOSPITAL_COMMUNITY): Payer: Self-pay

## 2024-03-08 ENCOUNTER — Encounter: Payer: Self-pay | Admitting: Family Medicine

## 2024-03-08 ENCOUNTER — Ambulatory Visit (INDEPENDENT_AMBULATORY_CARE_PROVIDER_SITE_OTHER): Payer: Medicare Other | Admitting: Family Medicine

## 2024-03-08 ENCOUNTER — Telehealth: Payer: Self-pay | Admitting: Family Medicine

## 2024-03-08 VITALS — BP 130/78 | HR 65 | Temp 98.8°F | Ht 61.0 in | Wt 136.0 lb

## 2024-03-08 DIAGNOSIS — E538 Deficiency of other specified B group vitamins: Secondary | ICD-10-CM

## 2024-03-08 DIAGNOSIS — I10 Essential (primary) hypertension: Secondary | ICD-10-CM | POA: Diagnosis not present

## 2024-03-08 DIAGNOSIS — E039 Hypothyroidism, unspecified: Secondary | ICD-10-CM

## 2024-03-08 DIAGNOSIS — J449 Chronic obstructive pulmonary disease, unspecified: Secondary | ICD-10-CM | POA: Diagnosis not present

## 2024-03-08 DIAGNOSIS — R4189 Other symptoms and signs involving cognitive functions and awareness: Secondary | ICD-10-CM

## 2024-03-08 DIAGNOSIS — F3342 Major depressive disorder, recurrent, in full remission: Secondary | ICD-10-CM | POA: Diagnosis not present

## 2024-03-08 MED ORDER — TOPIRAMATE 25 MG PO TABS: 25.0000 mg | ORAL_TABLET | Freq: Every day | ORAL | 0 refills | Status: DC

## 2024-03-08 MED ORDER — CYANOCOBALAMIN 1000 MCG/ML IJ SOLN: INTRAMUSCULAR | 1 refills | Status: AC

## 2024-03-08 MED ORDER — TOPIRAMATE 100 MG PO TABS: 100.0000 mg | ORAL_TABLET | Freq: Every day | ORAL | 5 refills | Status: DC

## 2024-03-08 NOTE — Telephone Encounter (Signed)
 Patient dropped off document Handicap Placard, to be filled out by provider. Patient requested to send it back via Call Patient to pick up within 2-days. Document is located in providers tray at front office.Please advise at Mobile [phone] (mobile)

## 2024-03-08 NOTE — Progress Notes (Signed)
 Phone [phone] In person visit   Subjective:   Alison Brooks is a 68 y.o. 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  04/21/2007    Priority: High   Age-related osteoporosis without current pathological fracture 05/30/2023    Priority: Medium    Hyperglycemia 09/22/2022    Priority: Medium    Aortic atherosclerosis (HCC) 03/31/2021    Priority: Medium    Hypothyroidism 07/17/2019    Priority: Medium    Gout 06/28/2014    Priority: Medium    Vitamin B12 deficiency 06/28/2014    Priority: Medium    Multiple pulmonary nodules 06/28/2014    Priority: Medium    Essential hypertension 05/17/2014    Priority: Medium    RESTLESS LEG SYNDROME, SEVERE 03/14/2009    Priority: Medium    Chronic interstitial cystitis 01/20/2009    Priority: Medium    Depression, major, recurrent (HCC) 03/19/2008    Priority: Medium    Hyperlipidemia 02/17/2008    Priority: Medium    Former smoker 12/21/2007    Priority: Medium    Fibromyalgia 04/21/2007    Priority: Medium    Obesity (BMI 30-39.9) 01/14/2014    Priority: Low   Lateral epicondylitis (tennis elbow) 01/02/2014    Priority: Low   GERD (gastroesophageal reflux disease) 03/26/2013    Priority: Low   LEG CRAMPS 05/16/2009    Priority: Low   TUBULOVILLOUS ADENOMA, COLON 02/17/2008    Priority: Low   NEPHROLITHIASIS 02/17/2008    Priority: Low   MIGRAINE, CLASSICAL W/O INTRACTABLE MIGRAINE 06/09/2007    Priority: Low   SYMPTOM, MEMORY LOSS 06/09/2007    Priority: Low   Amnestic MCI  (mild cognitive impairment with memory loss) 01/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 60 tablet 0   albuterol  (VENTOLIN  HFA) 108 (90 Base) MCG/ACT inhaler Inhale 2 puffs into the lungs every 6 (six) hours as needed. 18 g 5   allopurinol  (ZYLOPRIM ) 100 MG tablet TAKE 1 TABLET BY MOUTH DAILY 100 tablet 2   amLODipine  (NORVASC ) 2.5 MG tablet TAKE 1 TABLET BY MOUTH  DAILY 90 tablet 3   baclofen  (LIORESAL ) 10 MG tablet Take 10 mg by mouth 3 (three) times daily. PRN     Cholecalciferol (VITAMIN D) 50 MCG (2000 UT) tablet Take 2,000 Units by mouth daily.     citalopram  (CELEXA ) 20 MG tablet Take 1 tablet (20 mg total) by mouth daily. 90 tablet 3   diclofenac  sodium (VOLTAREN ) 1 % GEL Apply 4 g topically 4 (four) times daily. (Patient taking  differently: Apply 4 g topically 4 (four) times daily as needed (pain).) 100 g 1   donepezil  (ARICEPT ) 10 MG tablet Take 1 tablet (10 mg total) by mouth at bedtime. 30 tablet 5   ezetimibe  (ZETIA ) 10 MG tablet TAKE 1 TABLET BY MOUTH DAILY 100 tablet 2   famotidine  (PEPCID ) 20 MG tablet Take 1 tablet (20 mg total) by mouth at bedtime. OFFICE VISIT FOR FURTHER REFILLS 90 tablet 1   fluticasone  (FLONASE ) 50 MCG/ACT nasal spray Place 2 sprays into both nostrils daily. 18.2 mL 2   levothyroxine  (SYNTHROID , LEVOTHROID) 50 MCG tablet Take 50 mcg by mouth daily before breakfast.     pantoprazole  (PROTONIX ) 40 MG tablet TAKE 1 TABLET BY MOUTH  TWICE DAILY 30 TO 60   MINUTES BEFORE MEALS OFFICE VISIT FOR FURTHER REFILLS!!!!! 90 tablet 1   rosuvastatin  (CRESTOR ) 40 MG tablet TAKE 1 TABLET BY MOUTH DAILY 100 tablet 2   Tiotropium Bromide  Monohydrate (SPIRIVA  RESPIMAT) 2.5 MCG/ACT AERS INHALE 2 PUFFS INTO THE LUNGS ONCE DAILY. 4 g 11   topiramate  (TOPAMAX ) 100 MG tablet Take 1 tablet (100 mg total) by mouth daily. 30 tablet 5   topiramate  (TOPAMAX ) 25 MG tablet Take 1-3 tablets (25-75 mg total) by mouth daily. Along with 100 mg tablet- working on tapering topamax  90 tablet 0   cyanocobalamin  (VITAMIN B12) 1000 MCG/ML injection 1000 mcg (1 mg) injection once per per month or as directed. For 1 month starting may 2025 take weekly then monthly again 30 mL 1   No current facility-administered medications for this visit.     Objective:  BP 130/78   Pulse 65   Temp 98.8 F (37.1 C)   Ht 5\' 1"  (1.549 m)   Wt 136 lb (61.7 kg)   SpO2 96%   BMI 25.70 kg/m  Gen: NAD, resting comfortably CV: RRR no murmurs rubs or gallops Lungs: CTAB no crackles, wheeze, rhonchi Abdomen: soft/nontender/nondistended/normal bowel sounds. No rebound or guarding.  Ext: no edema Skin: warm, dry Neuro: hard to get words out at times- reports diagnosed as aphasia  Daughter present and supportive today    Assessment and Plan   # Memory changes-likely Alzheimer's dementia S: Patient was seen on 01/13/2024 at urgent care for memory changes/transient memory issues-concern for TIA and was directed to the emergency department.  In the emergency department only lab abnormality was potassium of 3.4 and urine changes potentially consistent with interstitial cystitis but was covered with fosfomycin given memory changes.  They thought with transient memory loss only and reassuring neurological exam but this was not consistent with TIA and they did not recommend emergent MRI but encouraged outpatient follow-up with neurology. CT Head without stroke or aneurysm.   She was seen by neurology on  01/19/2024 by Dr. Albertina Hugger.  MMSE showed moderate impairment and concern but this was not simply MCI.  MRI of the brain not possible due to spinal cord stimulator.  Dementia panel was essentially negative but she was started on Aricept .  Referred to neuropsychology for more advanced evaluation with return visit planned in 4 months.  Her Aricept  was started at 5 mg but later increased to 10 mg as planned. She reports was diagnosed with alzheimer's and aphasia.  -she feels like donepezil  10 mg is helping some but is the only thing she has difficulty swallowing and did not have trouble with the 5 mg-plans to reach out to neurology about this -reflected back afterwards and noted that she had quit cooking years  and years ago because had difficulty putting things together  Today patient reports ongoing issues with short-term memory-cannot remember what she is watched on TV yesterday for instance and that is very common for her.  Some trouble with prolonged reading.  Feels cognitively slowed.  Has difficulty saying more than a couple or few words at a time  We reflected back on the emergency department visit and her urine culture eventually grew no bacteria so discussed did not have true UTI.  Her TSH was normal in March A/P: Patient diagnosed with Alzheimer's dementia it appears by neurology - We wanted to see if we could reduce anything that might be contributing and we opted to try to wean her Topamax  at least down to 100 mg and see if she tolerates things from a back pain perspective as there have been some reports of memory changes and cognitive slowing on Topamax  - Last B12 was low normal at 250 on monthly injections-we planned to increase to weekly before going back to monthly injections-likely recheck next visit  # COPD S: Noted on chest x-ray 2016.  Patient is used albuterol  as needed for wheezing or shortness of breath.  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-patient on long-term Topamax  200 mg daily which has been helpful-we discussed possible cognitive slowing and plan to wean to 100 mg  #Hypertension S: Compliant with amlodipine  2.5 mg A/P: Well-controlled-continue current medication  # Hyperlipidemia with aortic atherosclerosis and moderate coronary artery calcifications on CT lung cancer screening S: compliant with rosuvastatin  40mg  -Also on Zetia  10 mg  Lab Results  Component Value Date   CHOL 136 03/28/2023   HDL 49.80 03/28/2023   LDLCALC 65 03/28/2023   LDLDIRECT 145.0 09/22/2022   TRIG 105.0 03/28/2023   CHOLHDL 3 03/28/2023    A/P: Cholesterols been well-controlled-will be due for repeat at follow-up.  Statins have some correlation with memory loss but with coronary artery calcifications believe it is important to maintain this  #hypothyroidism-follows with endocrinology Dr. Geanie Keen S: compliant On thyroid  medication-levothyroxine  50 mcg            Lab Results  Component Value Date   TSH 1.730 01/19/2024   A/P: Well-controlled recently and not likely contributing to memory issues-continue current medicine   # Hyperglycemia/insulin resistance/prediabetes-peak A1c 5.01 Mar 2022 but improved on last check Lab Results  Component Value Date   HGBA1C 5.6 03/28/2023   HGBA1C 5.8 09/22/2022   HGBA1C 5.8 03/16/2022    #Gout S: has not been on allopurinol  in 2024 and no flares  - prior Compliant with allopurinol  100 mg but fell off medication(s) list A/P: No recent flares-continue to monitor  #Depression S: medication: Citalopram  20 mg- tried off to see if helps memory but went back on  -poor control on Lexapro  20 mg after 2-3 years     03/08/2024    2:09 PM 12/09/2023    1:13 PM 10/28/2023    1:11 PM  Depression screen PHQ 2/9  Decreased Interest 0 2 2  Down,  Depressed, Hopeless 0 1 2  PHQ - 2 Score 0 3 4  Altered sleeping 0 2 2  Tired, decreased energy 0 3 3  Change in appetite 0 0 0  Feeling bad or failure about yourself  0 0 1  Trouble concentrating 0 2  2  Moving slowly or fidgety/restless 0 0 1  Suicidal thoughts 0 0 0  PHQ-9 Score 0 10 13  Difficult doing work/chores Not difficult at all Somewhat difficult Somewhat difficult  A/P: Reports reasonable control/full remission but that memory changes have been challenging for her.   #vitamin b12 deficiency S: compliant with b12 injections monthly Lab Results  Component Value Date   VITAMINB12 250 01/19/2024  A/P: Low normal B12-increase B12 to weekly for a month then can resume monthly  #overactive bladder S: OAB using oxybutynin  sparingly- helpful when used in the past but thankfully not using recently A/P: Advised to stay off of oxybutynin  due to potential cognitive effects  #Gerd/history of esophageal stricture S: doing well on protonix  40mg  twice daily A/P: Patient is doing well on Protonix -discussed that this could be related to B12 or memory changes but I believe it is necessary given prior strictures  Recommended follow up: Return in about 6 weeks (around 04/19/2024) for followup or sooner if needed.Schedule b4 you leave. Future Appointments  Date Time Provider Department Center  04/12/2024  3:00 PM Marrion Sjogren, PsyD [facility]  05/04/2024  2:00 PM Almira Jaeger, MD [facility]  07/02/2024  1:30 PM Dohmeier, Raoul Byes, MD [facility] None  07/31/2024 10:00 AM [facility] ANNUAL WELLNESS VISIT 1 [facility]    Lab/Order associations:   ICD-10-CM   1. Moderate cognitive impairment  R41.89     2. Vitamin B12 deficiency  E53.8 cyanocobalamin  (VITAMIN B12) 1000 MCG/ML injection    3. Chronic obstructive pulmonary disease, unspecified COPD type (HCC)  J44.9     4. Recurrent major depressive disorder, in full remission (HCC)  F33.42     5. Essential hypertension  I10      6. Hypothyroidism, unspecified type  E03.9       Meds ordered this encounter  Medications   topiramate  (TOPAMAX ) 100 MG tablet    Sig: Take 1 tablet (100 mg total) by mouth daily.    Dispense:  30 tablet    Refill:  5   topiramate  (TOPAMAX ) 25 MG tablet    Sig: Take 1-3 tablets (25-75 mg total) by mouth daily. Along with 100 mg tablet- working on tapering topamax     Dispense:  90 tablet    Refill:  0   cyanocobalamin  (VITAMIN B12) 1000 MCG/ML injection    Sig: 1000 mcg (1 mg) injection once per per month or as directed. For 1 month starting may 2025 take weekly then monthly again    Dispense:  30 mL    Refill:  1    Return precautions advised.  Clarisa Crooked, MD

## 2024-03-08 NOTE — Telephone Encounter (Signed)
 Form has been completed, called to make pt aware however phone just rang. Will try again tomorrow to make pt aware.

## 2024-03-08 NOTE — Patient Instructions (Addendum)
 TOPAMAX  week 1- 3 of the 25 mg tablets +100 mg Week 2- 2 of the 25 mg tablets+ 100 mg Week 3- 1 of the 25 mg tablets+100 mg Week 4- 100 mg only   If back pain worsens too substantially please let me know  Hold steady on other meds  Recommended follow up: Return in about 6 weeks (around 04/19/2024) for followup or sooner if needed.Schedule b4 you leave.

## 2024-03-09 NOTE — Telephone Encounter (Signed)
 Called and spoke with pt and pt request placard to be mailed to her, paper placed up front in the mail bin.

## 2024-03-23 ENCOUNTER — Encounter: Payer: Self-pay | Admitting: Acute Care

## 2024-03-26 DIAGNOSIS — E038 Other specified hypothyroidism: Secondary | ICD-10-CM | POA: Diagnosis not present

## 2024-03-28 ENCOUNTER — Encounter: Payer: Self-pay | Admitting: Acute Care

## 2024-03-28 DIAGNOSIS — Z87891 Personal history of nicotine dependence: Secondary | ICD-10-CM

## 2024-03-28 DIAGNOSIS — Z122 Encounter for screening for malignant neoplasm of respiratory organs: Secondary | ICD-10-CM

## 2024-03-29 ENCOUNTER — Other Ambulatory Visit: Payer: Self-pay | Admitting: Internal Medicine

## 2024-04-04 ENCOUNTER — Telehealth: Payer: Self-pay | Admitting: Internal Medicine

## 2024-04-04 NOTE — Telephone Encounter (Signed)
 CMN for Cpap synapse

## 2024-04-04 NOTE — Telephone Encounter (Signed)
 CMN for Cpap machine  Synapse

## 2024-04-09 DIAGNOSIS — Z1231 Encounter for screening mammogram for malignant neoplasm of breast: Secondary | ICD-10-CM | POA: Diagnosis not present

## 2024-04-09 LAB — HM MAMMOGRAPHY

## 2024-04-09 NOTE — Telephone Encounter (Signed)
 Rc'd signed copy from Dr. Linder Revere. Will fax to [phone] w/LOV notes. Will attach fax confirmation and send to scan.

## 2024-04-09 NOTE — Telephone Encounter (Signed)
 Duplicate.    Closing.

## 2024-04-10 ENCOUNTER — Encounter: Payer: Self-pay | Admitting: Family Medicine

## 2024-04-12 ENCOUNTER — Encounter: Attending: Psychology | Admitting: Psychology

## 2024-04-12 DIAGNOSIS — F4312 Post-traumatic stress disorder, chronic: Secondary | ICD-10-CM | POA: Insufficient documentation

## 2024-04-12 DIAGNOSIS — G4733 Obstructive sleep apnea (adult) (pediatric): Secondary | ICD-10-CM | POA: Insufficient documentation

## 2024-04-12 DIAGNOSIS — G2581 Restless legs syndrome: Secondary | ICD-10-CM | POA: Diagnosis not present

## 2024-04-12 DIAGNOSIS — R4189 Other symptoms and signs involving cognitive functions and awareness: Secondary | ICD-10-CM | POA: Insufficient documentation

## 2024-04-12 DIAGNOSIS — G894 Chronic pain syndrome: Secondary | ICD-10-CM | POA: Insufficient documentation

## 2024-04-12 DIAGNOSIS — G3184 Mild cognitive impairment, so stated: Secondary | ICD-10-CM | POA: Diagnosis not present

## 2024-04-18 ENCOUNTER — Ambulatory Visit (HOSPITAL_COMMUNITY)

## 2024-04-25 ENCOUNTER — Encounter: Attending: Psychology

## 2024-04-25 DIAGNOSIS — R4189 Other symptoms and signs involving cognitive functions and awareness: Secondary | ICD-10-CM | POA: Insufficient documentation

## 2024-04-25 DIAGNOSIS — G3184 Mild cognitive impairment, so stated: Secondary | ICD-10-CM | POA: Insufficient documentation

## 2024-04-25 DIAGNOSIS — G4733 Obstructive sleep apnea (adult) (pediatric): Secondary | ICD-10-CM | POA: Diagnosis not present

## 2024-04-25 DIAGNOSIS — G894 Chronic pain syndrome: Secondary | ICD-10-CM | POA: Insufficient documentation

## 2024-04-25 DIAGNOSIS — F4312 Post-traumatic stress disorder, chronic: Secondary | ICD-10-CM | POA: Insufficient documentation

## 2024-04-25 DIAGNOSIS — G2581 Restless legs syndrome: Secondary | ICD-10-CM | POA: Diagnosis not present

## 2024-04-25 NOTE — Progress Notes (Signed)
 Behavioral Observations:  The patient appeared well-groomed and appropriately dressed. Her manners were polite and appropriate to the situation. She ambulated independently and without issue. Hearing and vision were adequate for testing. Her speech was dysphonic and aphasic. The patient's attitude towards testing was positive and her effort was good.   Neuropsychology Note  BOBETTE LEYH completed 160 minutes of neuropsychological testing with technician, Josue Ned, BA, under the supervision of Norleen Asa, PsyD., Clinical Neuropsychologist. The patient did not appear overtly distressed by the testing session, per behavioral observation or via self-report to the technician. Rest breaks were offered.   Clinical Decision Making: In considering the patient's current level of functioning, level of presumed impairment, nature of symptoms, emotional and behavioral responses during clinical interview, level of literacy, and observed level of motivation/effort, a battery of tests was selected by Dr. Asa during initial consultation on 04/12/2024. This was communicated to the technician. Communication between the neuropsychologist and technician was ongoing throughout the testing session and changes were made as deemed necessary based on patient performance on testing, technician observations and additional pertinent factors such as those listed above.  Tests Administered: Automatic Data Edition (BNT-2) Controlled Oral Word Association Test (COWAT; FAS & Animals)  Finger Tapping Test (FTT) Grooved Pegboard Wechsler Adult Intelligence Scale, 4th Edition (WAIS-IV) Wechsler Memory Scale, 4th Edition (WMS-IV); Older Adult Battery   Results:  BNT:   Raw   Norm Score Percentile  Range  Boston Naming Test (BNT-2)  33 t = 30 2 %ile Below Average    COWAT:  FAS total= 11 Z= -1.90 Animals total= 7 Z= -2.30  FTT: R (DH) Average= 6.6 Percentile Rank= 24 L (NDH) Average=  6.6 Percentile Rank= 21  Grooved Pegboard:  R (DH) time= 225s Drops= 0 Percentile Rank= 13 L (NDH) time= 300s (completed at mark)  Drops=1 Percentile Rank= 20  WAIS-IV:   Composite Score Summary  Scale Sum of Scaled Scores Composite Score Percentile Rank 95% Conf. Interval Qualitative Description  Verbal Comprehension 14 VCI 70 2 66-77 Borderline  Perceptual Reasoning 13 PRI 67 1 62-75 Extremely Low  Working Memory 6 WMI 60 0.4 56-69 Extremely Low  Processing Speed 5 PSI 59 0.3 55-71 Extremely Low  Full Scale 38 FSIQ 59 0.3 56-64 Extremely Low  General Ability 27 GAI 65 1 61-71 Extremely Low   Verbal Comprehension Subtests Summary  Subtest Raw Score Scaled Score Percentile Rank Reference Group Scaled Score SEM  Similarities 12 5 5 4  0.95  Vocabulary 12 4 2 4  0.67  Information 6 5 5 6  0.73  The scaled scores in the Reference Group Scaled Score column are based on the performance of examinees aged 20:0-34:11 (i.e., the reference group). See Chapter 6 of the WAIS-IV Technical and Interpretive Manual for more information.  Perceptual Reasoning Subtests Summary  Subtest Raw Score Scaled Score Percentile Rank Reference Group Scaled Score SEM  Block Design 16 6 9 4  0.99  Matrix Reasoning 3 3 1 1  0.90  Visual Puzzles 4 4 2 2  0.99   Working English as a second language teacher  Subtest Raw Score Scaled Score Percentile Rank Reference Group Scaled Score SEM  Digit Span 10 2 0.4 1 0.73  Arithmetic 6 4 2 4  1.20   Processing Speed Subtests Summary  Subtest Raw Score Scaled Score Percentile Rank Reference Group Scaled Score SEM  Symbol Search 3 1 0.1 1 1.12  Coding 19 4 2 1  1.12   WMS-IV: Index Score Summary  Index Sum of Scaled  Scores Index Score Percentile Rank 95% Confidence Interval Qualitative Descriptor  Auditory Memory (AMI) 21 72 3 67-80 Borderline  Visual Memory (VMI) 8 66 1 62-72 Extremely Low  Immediate Memory (IMI) 17 73 4 68-81 Borderline  Delayed Memory (DMI) 12 63 1  58-74 Extremely Low    Primary Subtest Scaled Score Summary  Subtest Domain Raw Score Scaled Score Percentile Rank  Logical Memory I AM 18 5 5   Logical Memory II AM 7 5 5   Verbal [employer] Associates I AM 7 5 5   Verbal [facility] II AM 3 6 9   Visual Reproduction I VM 24 7 16   Visual Reproduction II VM 0 1 0.1  Symbol Span VWM 7 5 5    Auditory Memory Process Score Summary  Process Score Raw Score Scaled Score Percentile Rank Cumulative Percentage (Base Rate)  LM II Recognition 16 - - 17-25%  VPA II Recognition 22 - - 3-9%   Visual Memory Process Score Summary  Process Score Raw Score Scaled Score Percentile Rank Cumulative Percentage (Base Rate)  VR II Recognition 5 - - 51-75%     ABILITY-MEMORY ANALYSIS  Ability Score:  VCI: 70 Date of Testing:  WAIS-IV; WMS-IV 2024/04/25  Predicted Difference Method   Index Predicted WMS-IV Index Score Actual WMS-IV Index Score Difference Critical Value  Significant Difference Y/N Base Rate  Auditory Memory 84 72 12 10.41 Y 15-20%  Visual Memory 87 66 21 7.35 Y 5-10%  Immediate Memory 83 73 10 9.69 Y 20-25%  Delayed Memory 85 63 22 12.22 Y 5%  Statistical significance (critical value) at the .01 level.    Feedback to Patient: Alison Brooks will return on 08/31/2024 for an interactive feedback session with Dr. Corina at which time her test performances, clinical impressions and treatment recommendations will be reviewed in detail. The patient understands she can contact our office should she require our assistance before this time.  160 minutes spent face-to-face with patient administering standardized tests, 30 minutes spent scoring Radiographer, therapeutic). [CPT [id number], [id number]]  Full report to follow.

## 2024-05-04 ENCOUNTER — Ambulatory Visit (INDEPENDENT_AMBULATORY_CARE_PROVIDER_SITE_OTHER): Admitting: Family Medicine

## 2024-05-04 ENCOUNTER — Encounter: Payer: Self-pay | Admitting: Family Medicine

## 2024-05-04 ENCOUNTER — Ambulatory Visit: Payer: Self-pay | Admitting: Family Medicine

## 2024-05-04 ENCOUNTER — Telehealth: Payer: Self-pay

## 2024-05-04 VITALS — BP 102/60 | HR 67 | Temp 97.6°F | Ht 61.0 in | Wt 133.8 lb

## 2024-05-04 DIAGNOSIS — G8929 Other chronic pain: Secondary | ICD-10-CM | POA: Diagnosis not present

## 2024-05-04 DIAGNOSIS — I10 Essential (primary) hypertension: Secondary | ICD-10-CM

## 2024-05-04 DIAGNOSIS — M5441 Lumbago with sciatica, right side: Secondary | ICD-10-CM

## 2024-05-04 DIAGNOSIS — R739 Hyperglycemia, unspecified: Secondary | ICD-10-CM

## 2024-05-04 DIAGNOSIS — E538 Deficiency of other specified B group vitamins: Secondary | ICD-10-CM

## 2024-05-04 DIAGNOSIS — Z131 Encounter for screening for diabetes mellitus: Secondary | ICD-10-CM

## 2024-05-04 LAB — COMPREHENSIVE METABOLIC PANEL WITH GFR
ALT: 74 U/L — ABNORMAL HIGH (ref 0–35)
AST: 49 U/L — ABNORMAL HIGH (ref 0–37)
Albumin: 4.1 g/dL (ref 3.5–5.2)
Alkaline Phosphatase: 102 U/L (ref 39–117)
BUN: 13 mg/dL (ref 6–23)
CO2: 25 meq/L (ref 19–32)
Calcium: 8.6 mg/dL (ref 8.4–10.5)
Chloride: 109 meq/L (ref 96–112)
Creatinine, Ser: 0.74 mg/dL (ref 0.40–1.20)
GFR: 82 mL/min (ref >60.00)
Glucose, Bld: 89 mg/dL (ref 70–99)
Potassium: 3.6 meq/L (ref 3.5–5.1)
Sodium: 140 meq/L (ref 135–145)
Total Bilirubin: 0.5 mg/dL (ref 0.2–1.2)
Total Protein: 6.5 g/dL (ref 6.0–8.3)

## 2024-05-04 LAB — VITAMIN B12: Vitamin B-12: 209 pg/mL — ABNORMAL LOW (ref 211–911)

## 2024-05-04 LAB — HEMOGLOBIN A1C: Hgb A1c MFr Bld: 6 % (ref 4.6–6.5)

## 2024-05-04 MED ORDER — TOPIRAMATE 200 MG PO TABS: 200.0000 mg | ORAL_TABLET | Freq: Every day | ORAL | 3 refills | Status: AC

## 2024-05-04 NOTE — Patient Instructions (Addendum)
 I am sorry your pain worsened and memory did not improve on the topamax  reduction. We opted to go back to 200 mg daily- for next week take 100 mg tablet plus two of the 25 mg tablets and after a week can take the 200 mg and we will plan on that long term  Continue B12 monthly for now as long as levels over 400  Please stop by lab before you go If you have mychart- we will send your results within 3 business days of us  receiving them.  If you do not have mychart- we will call you about results within 5 business days of us  receiving them.  *please also note that you will see labs on mychart as soon as they post. I will later go in and write notes on them- will say notes from Dr. Katrinka   Recommended follow up: Return in about 6 months (around 11/04/2024) for physical or sooner if needed.Schedule b4 you leave.

## 2024-05-04 NOTE — Addendum Note (Signed)
 Addended by: Avigayil Ton on: 05/04/2024 02:40 PM   Modules accepted: Orders

## 2024-05-04 NOTE — Telephone Encounter (Signed)
 RICK Pao from [facility] called stating pt blood clotted to quickly and they were not able to process it. I called pt and made aware and she will call when she is able to go to [facility] next week to have it redrawn.

## 2024-05-04 NOTE — Progress Notes (Signed)
 Phone [phone] In person visit   Subjective:   Alison Brooks is a 68 y.o. 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  04/21/2007    Priority: High   Age-related osteoporosis without current pathological fracture 05/30/2023    Priority: Medium    Hyperglycemia 09/22/2022    Priority: Medium    Aortic atherosclerosis (HCC) 03/31/2021    Priority: Medium    Hypothyroidism 07/17/2019    Priority: Medium    Gout 06/28/2014    Priority: Medium    Vitamin B12 deficiency 06/28/2014    Priority: Medium    Multiple pulmonary nodules 06/28/2014    Priority: Medium    Essential hypertension 05/17/2014    Priority: Medium    RESTLESS LEG SYNDROME, SEVERE 03/14/2009    Priority: Medium    Chronic interstitial cystitis 01/20/2009    Priority: Medium    Depression, major, recurrent (HCC) 03/19/2008    Priority: Medium    Hyperlipidemia 02/17/2008    Priority: Medium    Former smoker 12/21/2007    Priority: Medium    Fibromyalgia 04/21/2007    Priority: Medium    Obesity (BMI 30-39.9) 01/14/2014    Priority: Low   Lateral epicondylitis (tennis elbow) 01/02/2014    Priority: Low   GERD (gastroesophageal reflux disease) 03/26/2013    Priority: Low   LEG CRAMPS 05/16/2009    Priority: Low   TUBULOVILLOUS ADENOMA, COLON 02/17/2008    Priority: Low   NEPHROLITHIASIS 02/17/2008    Priority: Low   MIGRAINE, CLASSICAL W/O INTRACTABLE MIGRAINE 06/09/2007    Priority: Low   SYMPTOM, MEMORY LOSS 06/09/2007    Priority: Low   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 60 tablet 0   albuterol  (VENTOLIN  HFA) 108 (90 Base) MCG/ACT inhaler Inhale 2 puffs into the lungs every 6 (six) hours as needed. 18 g 5   allopurinol  (ZYLOPRIM ) 100 MG tablet TAKE 1 TABLET BY MOUTH DAILY 100 tablet 2   amLODipine  (NORVASC ) 2.5 MG tablet TAKE 1 TABLET BY MOUTH  DAILY 90 tablet 3   baclofen  (LIORESAL ) 10 MG tablet Take 10 mg by mouth 3 (three) times daily. PRN     Cholecalciferol (VITAMIN D) 50 MCG (2000 UT) tablet Take 2,000 Units by mouth daily.     citalopram  (CELEXA ) 20 MG tablet Take 1 tablet (20 mg total) by mouth daily. 90 tablet 3   cyanocobalamin  (VITAMIN B12) 1000 MCG/ML injection 1000 mcg (1 mg) injection once per per month or as directed. For 1 month starting may 2025 take  weekly then monthly again 30 mL 1   diclofenac  sodium (VOLTAREN ) 1 % GEL Apply 4 g topically 4 (four) times daily. 100 g 1   donepezil  (ARICEPT ) 10 MG tablet Take 1 tablet (10 mg total) by mouth at bedtime. 30 tablet 5   famotidine  (PEPCID ) 20 MG tablet Take 1 tablet (20 mg total) by mouth at bedtime. OFFICE VISIT FOR FURTHER REFILLS 90 tablet 1   fluticasone  (FLONASE ) 50 MCG/ACT nasal spray Place 2 sprays into both nostrils daily. 18.2 mL 2   levothyroxine  (SYNTHROID , LEVOTHROID) 50 MCG tablet Take 50 mcg by mouth daily before breakfast.     pantoprazole  (PROTONIX ) 40 MG tablet TAKE 1 TABLET BY MOUTH  TWICE DAILY 30 TO 60  MINUTES BEFORE MEALS OFFICE VISIT FOR  FURTHER REFILLS!!!!! 90 tablet 1   rosuvastatin  (CRESTOR ) 40 MG tablet TAKE 1 TABLET BY MOUTH DAILY 100 tablet 2   Tiotropium Bromide  Monohydrate (SPIRIVA  RESPIMAT) 2.5 MCG/ACT AERS INHALE 2 PUFFS INTO THE LUNGS ONCE DAILY. 4 g 11   ezetimibe  (ZETIA ) 10 MG tablet TAKE 1 TABLET BY MOUTH DAILY 100 tablet 2   topiramate  (TOPAMAX ) 200 MG tablet Take 1 tablet (200 mg total) by mouth daily. 90 tablet 3   No current facility-administered medications for this visit.     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 IM): She is back being consistent with monthly injections on the first Monday of each month Lab Results  Component Value Date   VITAMINB12 250 01/19/2024  A/P: hopefully improved- update B12 today. Continue current meds for now    #Hypertension S: Compliant with amlodipine  2.5 mg BP Readings from Last 3 Encounters:  05/04/24 102/60  03/08/24 130/78  01/19/24 (!) 102/55  A/P: Blood pressure is  well-controlled-continue current medication  #hypothyroidism-follows with endocrinology Dr. Dale S: compliant On thyroid  medication-levothyroxine  50 mcg           A/P: Recently saw endocrinology since her last visit and TSH was well-controlled-continue current medication   # Hyperglycemia/insulin resistance/prediabetes-peak A1c 5.01 Mar 2022- try to update today but ordered after visit so not sure if will process Lab Results  Component Value Date   HGBA1C 5.6 03/28/2023   HGBA1C 5.8 09/22/2022   HGBA1C 5.8 03/16/2022   Recommended follow up: Return in about 6 months (around 11/04/2024) for physical or sooner if needed.Schedule b4 you  leave. Future Appointments  Date Time Provider Department Center  06/05/2024  1:00 PM Corina Norleen SAUNDERS, PsyD [facility] CPR  07/02/2024  1:30 PM Dohmeier, Dedra, MD [facility] None  07/31/2024 10:00 AM [facility] ANNUAL WELLNESS VISIT 1 [facility]  09/10/2024  2:00 PM Rodenbough, Norleen SAUNDERS, PsyD [facility]    Lab/Order associations:   ICD-10-CM   1. Chronic bilateral low back pain with right-sided sciatica  G89.29    M54.41     2. Essential hypertension  I10 CBC with Differential/Platelet    Comprehensive metabolic panel with GFR    Comprehensive metabolic panel with GFR    CBC with Differential/Platelet    CANCELED: Comprehensive metabolic panel with GFR    CANCELED: CBC with Differential/Platelet    3. Vitamin B12 deficiency  E53.8 Vitamin B12    Vitamin B12    CANCELED: Vitamin B12    4. Screening for diabetes mellitus  Z13.1 HgB A1c    5. Hyperglycemia  R73.9 HgB A1c      Meds ordered this encounter  Medications   topiramate  (TOPAMAX ) 200 MG tablet    Sig: Take 1 tablet (200 mg total) by mouth daily.    Dispense:  90 tablet    Refill:  3    Return precautions advised.  Garnette Lukes, MD

## 2024-05-04 NOTE — Telephone Encounter (Signed)
 Do you need to reorder the CBC?

## 2024-05-07 ENCOUNTER — Other Ambulatory Visit: Payer: Self-pay

## 2024-05-07 DIAGNOSIS — I10 Essential (primary) hypertension: Secondary | ICD-10-CM

## 2024-05-07 NOTE — Telephone Encounter (Signed)
Cbc reordered

## 2024-05-08 ENCOUNTER — Other Ambulatory Visit

## 2024-05-14 ENCOUNTER — Encounter: Admitting: Psychology

## 2024-05-14 ENCOUNTER — Encounter: Payer: Self-pay | Admitting: Psychology

## 2024-05-14 DIAGNOSIS — G2581 Restless legs syndrome: Secondary | ICD-10-CM

## 2024-05-14 DIAGNOSIS — R4189 Other symptoms and signs involving cognitive functions and awareness: Secondary | ICD-10-CM

## 2024-05-14 DIAGNOSIS — G894 Chronic pain syndrome: Secondary | ICD-10-CM | POA: Diagnosis not present

## 2024-05-14 DIAGNOSIS — G4733 Obstructive sleep apnea (adult) (pediatric): Secondary | ICD-10-CM

## 2024-05-14 DIAGNOSIS — F4312 Post-traumatic stress disorder, chronic: Secondary | ICD-10-CM

## 2024-05-14 NOTE — Progress Notes (Addendum)
 Neuropsychological Consultation   Patient:   Alison Brooks   DOB:   06-24-1956  MR Number:  [id number]  Location:  [facility] FOR PAIN AND [facility] MEDICINE [facility] PHYSICAL MEDICINE AND REHABILITATION [address] [place], [address] [place] [address] Dept: [phone]           Date of Service:   04/12/2024  Location of Service and Individuals present: Today's visit was conducted in my outpatient clinic office with the patient myself present.  Start Time:   3 PM End Time:   5 PM  Today's visit included a 1 hour and 15-minute face-to-face clinical interview and the other 45 minutes was spent with report writing, setting up testing protocols and review of available medical records.  Patient Consent and Confidentiality: Limits of confidentiality were reviewed including the fact that the patient had been referred for neuropsychological evaluation that may include formal evaluation with report to be produced and provided to the patient's referring physician as well as being made available in the patient's electronic medical records.  The patient consents to proceed.  Consent for Evaluation and Treatment:  Signed:  Yes Explanation of Privacy Policies:  Signed:  Yes Discussion of Confidentiality Limits:  Yes  Provider/Observer:  Norleen Asa, Psy.D.       Clinical Neuropsychologist       Billing Code/Service: 96116/[id number]   Chief Complaint:     Chief Complaint  Patient presents with   Memory Loss   Anxiety   Depression   Post-Traumatic Stress Disorder    Reason for Service:    FINLEY DINKEL is a 68 year old female referred for neuropsychological consultation/evaluation by her treating neurologist Dedra Gores, MD after initially being referred for neurological workup by her PCP Garnette Lukes, MD.  The patient was referred for this consultation due to ongoing and rapidly progressing amnestic memory loss.    - Presents with episodes of confusion,  disorientation, and memory difficulties of sudden onset. - Experiences difficulties with misplacing items (checkbook, keys), forgetting how to get to known locations, and geographic disorientation. - Reports worsening expressive language (dysphasia), memory changes, difficulty following directions (including cooking recipes), and challenges with multitasking.  HISTORY OF PRESENTING PROBLEM(S) - History of Presenting Problem(s): Reports a six-month history of significant difficulties. However, also notes subtle changes began three to four years ago, including forgetting minor things, decreased comprehension, and reduced cognitive efficiency. Issues with direction, following cooking instructions, and reduced reading comprehension started then but became markedly problematic six months ago. Notes observable worsening of expressive language over the past six months, with fluctuations in severity. Reports both episodic and semantic memory difficulties. Cueing is helpful for memory retrieval. Increased anxiety exacerbates cognitive symptoms.  CURRENT FUNCTIONING - Sleep: Experiences significant sleep disturbance with nightmares almost every night, associated with past trauma. When not using CPAP, wakes after approximately four hours of sleep with difficulty returning to sleep. Reports sleeping much better with CPAP use. - Employment/Education: Not currently working. 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 - Family: Lives with husband of 53 years. Has one sister with a history of substance abuse and legal issues related to theft. - Social: Avoids crowded places. Experiences significant anger and agitation when very stressed. - Eating Regime/Appetite: Recent significant weight loss. - Recreational/Interests: Reports a reduction in reading comprehension. Now receives help with cooking.  CURRENT MEDICATIONS - Current Medications: Topiramate  200 mg  daily for migraine prophylaxis, Synthroid  for hypothyroidism, and  Celexa  for depression. Reports a significant reduction in baclofen  use for chronic pain. PCP is working on discontinuing potentially contributing medications, including Topamax . Not currently on any opiate medications.  PSYCHIATRIC HISTORY - Psychiatric History: History of significant traumatic experiences as a teen with continued symptoms consistent with chronic posttraumatic stress disorder.  History of significant depression, now rare. History of major depressive disorder and anxiety. Reports increased anxiety worsens cognitive symptoms. Also reports increased frustration and agitation. Noted a change in the capacity for emotional expression and an inability to produce tears.  - Other interventions: Previously had a spinal cord stimulator for chronic pain, which was initially very effective and allowed cessation of opiate medications, but it is no longer functioning.  MEDICAL HISTORY - Personal and Family Medical History: Past medical history includes obstructive sleep apnea (OSA), COPD overlapping with OSA (developed post-COVID), pulmonary nodules, chronic pain syndrome, fibromyalgia, kidney stones, hypertension, high cholesterol, arthritis, chronic back pain, dyspnea, fatty liver, heart murmur, hematuria, a single seizure in 2007 (attributed to anesthesia and Prozac ), hyperlipidemia, hypothyroidism, long COVID, migraines, osteoporosis, restless leg syndrome, hyperglycemia, and leg cramps. Also diagnosed with failed back syndrome. Had a hip replacement two years ago and a section of small intestine removed four years ago. History of low B12 readings over the past four years. Had typhoid fever as an infant and usual childhood illnesses (measles, chickenpox). Non-compliant with CPAP recently due to painful dental issues (abscess) and difficulty with mask fit, but plans to resume use. Cannot have an MRI due to the non-functioning spinal cord  stimulator. - No family history of dementia; most family members died young from cardiovascular disease.  DEVELOPMENTAL, SOCIAL AND FAMILY HISTORY Family: - Family of Origin Born in [place], [place] . Has one sister. Developmental History: - Developmental History Right-handed. Educational History - Educational History: Graduated from high school. Received LPN from [employer] with a 4.0 GPA and highest honors. Excelled in science courses with relative weakness in math. 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 Relationship History - Relationship History: Currently married for 53 years, no previous marriages.  RISK ASSESSMENT Risk Assessment: - Suicidal Ideation: Denied. - Homicidal Ideation: Denied. - Self-harm: Denied. - Violence & Aggression: Reports significant anger and agitation when very stressed.  MENTAL STATE EXAM: - Perception: Denies auditory or visual hallucinations. - Thought Process: Reports episodes of confusion. - Thought Form: Reports dysphasia with observable worsening of expressive language capacity. - Judgement: On some days, knows how to use stove functions, and on other days, cannot.  TEST RESULTS Summary of Findings: APOE genotyping was negative for the APOE4 variant and showed two copies of the E3 genotype, which is not associated with increased risk for late-onset Alzheimer's disease. ATN blood profile showed a normal beta-amyloid 42/40 ratio and normal concentrations of p-[id number] and NFL, with results not consistent with an Alzheimer's type pathology.   Medical History:   Past Medical History:  Diagnosis Date   Anxiety    Arthritis    back, neck   Chronic back pain    COPD (chronic obstructive pulmonary disease) (HCC) 2021   after Covid   Depression    Dyspnea    Since covid , NOW ON INHALERS   Fatty liver     Fibromyalgia    GERD (gastroesophageal reflux disease)    Heart murmur    Hematuria    History of adenomatous polyp of colon  History of COVID-19 10/2020   History of kidney stones    History of seizure    x1 2007 post op (per pt neurologist work-up done ?mixture of anesthesia medication and prozac  that pt had taken)  and has not any issues since    History of squamous cell carcinoma excision    Hyperlipidemia    Hypertension    Hypothyroidism    IC (interstitial cystitis)    Long COVID    Migraine    Osteoporosis    Pulmonary nodules    multiple per ct -- monitored by pcp   Right ureteral stone    RLS (restless legs syndrome)          Patient Active Problem List   Diagnosis Date Noted   Moderate cognitive impairment 01/19/2024   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/19/2024   Rhinitis 11/04/2023   Mild obstructive sleep apnea 08/26/2023   Age-related osteoporosis without current pathological fracture 05/30/2023   Snoring 03/29/2023   Hyperglycemia 09/22/2022   S/P total right hip arthroplasty 05/18/2022   S/P right hemicolectomy 06/12/2021   Aortic atherosclerosis (HCC) 03/31/2021   COPD (chronic obstructive pulmonary disease) (HCC) 11/24/2020   History of total hysterectomy 07/17/2019   Hypothyroidism 07/17/2019   Vertigo 11/06/2016   Dyspnea 11/02/2014   Upper airway cough syndrome 11/02/2014   Gout 06/28/2014   Vitamin B12 deficiency 06/28/2014   Multiple pulmonary nodules 06/28/2014   Essential hypertension 05/17/2014   Obesity (BMI 30-39.9) 01/14/2014   Lateral epicondylitis (tennis elbow) 01/02/2014   GERD (gastroesophageal reflux disease) 03/26/2013   LEG CRAMPS 05/16/2009   RESTLESS LEG SYNDROME, SEVERE 03/14/2009   Chronic interstitial cystitis 01/20/2009   Depression, major, recurrent (HCC) 03/19/2008   TUBULOVILLOUS ADENOMA, COLON 02/17/2008   Hyperlipidemia 02/17/2008   NEPHROLITHIASIS 02/17/2008   Former smoker 12/21/2007    MIGRAINE, CLASSICAL W/O INTRACTABLE MIGRAINE 06/09/2007   SYMPTOM, MEMORY LOSS 06/09/2007   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  The interpretation of the CT was of no acute intracranial abnormality.  Behavioral Observation/Mental Status:   ZULEIKA GALLUS  presents as a 68 y.o.-year-old Right handed Caucasian Female who appeared her stated age. her dress was Appropriate and she was Well Groomed and her manners were Appropriate to the situation.  her participation was indicative of Appropriate behaviors.  There were not physical disabilities noted.  she displayed an appropriate level of cooperation and motivation.    Interactions:    Active Appropriate  Attention:   abnormal and attention span appeared shorter than expected for age  Memory:   abnormal; remote memory intact, recent memory impaired  Visuo-spatial:   not examined  Speech (Volume):  normal  Speech:   normal; some word finding and naming issues noted during clinical interview.  Thought Process:  Coherent and Relevant  Coherent, Linear, and Logical  Though Content:  WNL; not suicidal and not homicidal  Orientation:   person, place, time/date, and situation  Judgment:   Good  Planning:   Fair  Affect:    Anxious  Mood:    Anxious  Insight:   Good  Intelligence:   high  Family Med/Psych History:  Family History  Problem Relation  Age of Onset   Heart attack Mother        Mom 71, Dad 71   Hypertension Mother    Heart disease Mother    Heart disease Father    Diabetes Father    Colon cancer Maternal Grandmother        not sure age of onset   Esophageal cancer Neg Hx    Rectal cancer Neg Hx    Stomach  cancer Neg Hx    Impression/DX:     - Presenting Problem: The patient is a 68 year old female presenting with rapidly progressing amnestic memory loss, confusion, and expressive language difficulties. - Precipitating Factors: The significant worsening of symptoms occurred approximately six months ago with patient now noting that with hindsight she began having some cognitive changes 3 to 4 years ago. - Perpetuating Factors: Non-compliance with CPAP due to dental issues, sleep disturbance with nightmares, and increased anxiety. - Protecting Factors: Supportive husband, history of high academic achievement, insight into deficits, and willingness to engage in treatment. Biochemical markers are not consistent with Alzheimer's pathology.   Disposition/Plan:  The patient presents with a rapid decline in cognitive function, particularly memory and language, which appears to be precipitated by an unknown trigger approximately six months ago, though subtle symptoms were present for three to four years. Factors that seem to have predisposed the patient to the problem include multiple chronic medical conditions, chronic pain, and a history of depression and anxiety. The current problem is maintained by poor sleep quality secondary to OSA and nightmares, CPAP non-compliance, and anxiety. However, the protective and positive factors include a supportive spouse, past intellectual strengths, good insight, and normal Alzheimer's-related biomarkers.  The patient has been set up for formal neuropsychological evaluation including the Wechsler Adult Intelligence Scale, Wechsler Memory Scale, controlled oral Word Association test, [employer], finger tapping Test and grooved pegboard test.  Once this is completed a formal report will be produced and made available in the patient's EMR and forwarded to her referring neurologist.  I will also sit down with the patient and any family members that she would like to bring  along and go over the results and work on specific recommendations for the patient.  Diagnosis:    Amnestic MCI (mild cognitive impairment with memory loss)  Moderate cognitive impairment  Mild obstructive sleep apnea  RESTLESS LEG SYNDROME, SEVERE  Chronic pain syndrome  Chronic posttraumatic stress disorder        Note: This document was prepared using Dragon voice recognition software and may include unintentional dictation errors.   Electronically Signed   _______________________ Norleen Asa, Psy.D. Clinical Neuropsychologist

## 2024-05-14 NOTE — Progress Notes (Signed)
 Neuropsychological Evaluation   Patient:  Alison Brooks   DOB: Jan 09, 1956  MR Number: [id number]  Location: [facility] [address] [place], [address] [place] [address] Dept: [phone]  Start: 1 PM End: 2 PM  Provider/Observer:     Norleen JONELLE Asa PsyD  Chief Complaint:      Chief Complaint  Patient presents with   Memory Loss   Anxiety   Depression   Post-Traumatic Stress Disorder    Reason For Service:      Alison Brooks is a 68 year old female referred for neuropsychological consultation/evaluation by her treating neurologist Dedra Gores, MD after initially being referred for neurological workup by her PCP Garnette Lukes, MD.  The patient was referred for this consultation due to ongoing and rapidly progressing amnestic memory loss.    - Presents with episodes of confusion, disorientation, and memory difficulties of sudden onset. - Experiences difficulties with misplacing items (checkbook, keys), forgetting how to get to known locations, and geographic disorientation. - Reports worsening expressive language (dysphasia), memory changes, difficulty following directions (including cooking recipes), and challenges with multitasking.  HISTORY OF PRESENTING PROBLEM(S) - History of Presenting Problem(s): Reports a six-month history of significant difficulties. However, also notes subtle changes began three to four years ago, including forgetting minor things, decreased comprehension, and reduced cognitive efficiency. Issues with direction, following cooking instructions, and reduced reading comprehension started then but became markedly problematic six months ago. Notes observable worsening of expressive language over the past six months, with fluctuations in severity. Reports both episodic and semantic memory difficulties. Cueing is helpful for memory retrieval. Increased  anxiety exacerbates cognitive symptoms.  CURRENT FUNCTIONING - Sleep: Experiences significant sleep disturbance with nightmares almost every night, associated with past trauma. When not using CPAP, wakes after approximately four hours of sleep with difficulty returning to sleep. Reports sleeping much better with CPAP use. - Employment/Education: Not currently working. 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 - Family: Lives with husband of 53 years. Has one sister with a history of substance abuse and legal issues related to theft. - Social: Avoids crowded places. Experiences significant anger and agitation when very stressed. - Eating Regime/Appetite: Recent significant weight loss. - Recreational/Interests: Reports a reduction in reading comprehension. Now receives help with cooking.  CURRENT MEDICATIONS - Current Medications: Topiramate  200 mg daily for migraine prophylaxis, Synthroid  for hypothyroidism, and Celexa  for depression. Reports a significant reduction in baclofen  use for chronic pain. PCP is working on discontinuing potentially contributing medications, including Topamax . Not currently on any opiate medications.  PSYCHIATRIC HISTORY - Psychiatric History: History of significant traumatic experiences as a teen with continued symptoms consistent with chronic posttraumatic stress disorder.  History of significant depression, now rare. History of major depressive disorder and anxiety. Reports increased anxiety worsens cognitive symptoms. Also reports increased frustration and agitation. Noted a change in the capacity for emotional expression and an inability to produce tears.   - Other interventions: Previously had a spinal cord stimulator for chronic pain, which was initially very effective and allowed cessation of opiate medications, but it is no longer functioning.  MEDICAL HISTORY - Personal and Family Medical History: Past  medical history includes obstructive sleep apnea (OSA), COPD overlapping with OSA (developed post-COVID), pulmonary nodules, chronic pain syndrome, fibromyalgia, kidney stones, hypertension, high cholesterol, arthritis, chronic back pain, dyspnea, fatty liver, heart murmur, hematuria, a single seizure in  2007 (attributed to anesthesia and Prozac ), hyperlipidemia, hypothyroidism, long COVID, migraines, osteoporosis, restless leg syndrome, hyperglycemia, and leg cramps. Also diagnosed with failed back syndrome. Had a hip replacement two years ago and a section of small intestine removed four years ago. History of low B12 readings over the past four years. Had typhoid fever as an infant and usual childhood illnesses (measles, chickenpox). Non-compliant with CPAP recently due to painful dental issues (abscess) and difficulty with mask fit, but plans to resume use. Cannot have an MRI due to the non-functioning spinal cord stimulator. - No family history of dementia; most family members died young from cardiovascular disease.  DEVELOPMENTAL, SOCIAL AND FAMILY HISTORY Family: - Family of Origin Born in [place], [place] . Has one sister. Developmental History: - Developmental History Right-handed. Educational History - Educational History: Graduated from high school. 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 Excelled in science courses with relative weakness in math. 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 Relationship History - Relationship History: Currently married for 53 years, no previous marriages.  RISK ASSESSMENT Risk Assessment: - Suicidal Ideation: Denied. - Homicidal Ideation: Denied. - Self-harm: Denied. - Violence & Aggression: Reports significant anger and agitation when very stressed.  MENTAL STATE EXAM: - Perception: Denies  auditory or visual hallucinations. - Thought Process: Reports episodes of confusion. - Thought Form: Reports dysphasia with observable worsening of expressive language capacity. - Judgement: On some days, knows how to use stove functions, and on other days, cannot.  TEST RESULTS Summary of Findings: APOE genotyping was negative for the APOE4 variant and showed two copies of the E3 genotype, which is not associated with increased risk for late-onset Alzheimer's disease. ATN blood profile showed a normal beta-amyloid 42/40 ratio and normal concentrations of p-tau181 and NFL, with results not consistent with an Alzheimer's type pathology.   Medical History:                         Past Medical History:  Diagnosis Date   Anxiety     Arthritis      back, neck   Chronic back pain     COPD (chronic obstructive pulmonary disease) (HCC) 2021    after Covid   Depression     Dyspnea      Since covid , NOW ON INHALERS   Fatty liver     Fibromyalgia     GERD (gastroesophageal reflux disease)     Heart murmur     Hematuria     History of adenomatous polyp of colon     History of COVID-19 10/2020   History of kidney stones     History of seizure      x1 2007 post op (per pt neurologist work-up done ?mixture of anesthesia medication and prozac  that pt had taken)  and has not any issues since    History of squamous cell carcinoma excision     Hyperlipidemia     Hypertension     Hypothyroidism     IC (interstitial cystitis)     Long COVID     Migraine     Osteoporosis     Pulmonary nodules      multiple per ct -- monitored by pcp   Right ureteral stone     RLS (restless legs syndrome)  Patient Active Problem List    Diagnosis Date Noted   Moderate cognitive impairment 01/19/2024   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/19/2024   Rhinitis 11/04/2023   Mild obstructive sleep apnea 08/26/2023    Age-related osteoporosis without current pathological fracture 05/30/2023   Snoring 03/29/2023   Hyperglycemia 09/22/2022   S/P total right hip arthroplasty 05/18/2022   S/P right hemicolectomy 06/12/2021   Aortic atherosclerosis (HCC) 03/31/2021   COPD (chronic obstructive pulmonary disease) (HCC) 11/24/2020   History of total hysterectomy 07/17/2019   Hypothyroidism 07/17/2019   Vertigo 11/06/2016   Dyspnea 11/02/2014   Upper airway cough syndrome 11/02/2014   Gout 06/28/2014   Vitamin B12 deficiency 06/28/2014   Multiple pulmonary nodules 06/28/2014   Essential hypertension 05/17/2014   Obesity (BMI 30-39.9) 01/14/2014   Lateral epicondylitis (tennis elbow) 01/02/2014   GERD (gastroesophageal reflux disease) 03/26/2013   LEG CRAMPS 05/16/2009   RESTLESS LEG SYNDROME, SEVERE 03/14/2009   Chronic interstitial cystitis 01/20/2009   Depression, major, recurrent (HCC) 03/19/2008   TUBULOVILLOUS ADENOMA, COLON 02/17/2008   Hyperlipidemia 02/17/2008   NEPHROLITHIASIS 02/17/2008   Former smoker 12/21/2007   MIGRAINE, CLASSICAL W/O INTRACTABLE MIGRAINE 06/09/2007   SYMPTOM, MEMORY LOSS 06/09/2007   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  04/21/2007   Fibromyalgia 04/21/2007      Additional Tests and Measures from other records:   Neuroimaging Results: Although patient is unable to have MRI due to spinal cord stimulator leads still in place the patient did have a head CT without contrast performed on 01/13/2024.  The interpretation of the CT was of no acute intracranial abnormality.  Tests Administered: Automatic Data Edition (BNT-2) Controlled Oral Word Association Test (COWAT; FAS & Animals)  Finger Tapping Test (FTT) Grooved Pegboard Wechsler Adult Intelligence Scale, 4th Edition (WAIS-IV) Wechsler Memory Scale, 4th Edition (WMS-IV); Older Adult Battery   Participation Level:   Active  Participation Quality:  Appropriate      Behavioral  Observation:  The patient appeared well-groomed and appropriately dressed. Her manners were polite and appropriate to the situation. She ambulated independently and without issue. Hearing and vision were adequate for testing. Her speech was dysphonic and aphasic. The patient's attitude towards testing was positive and her effort was good.   Well Groomed, Alert, and Appropriate.   Test Results:   Initially, an estimation was made as to the patient's premorbid intellectual and cognitive functioning to provide a comparison point for the current objective neuropsychological test data.  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  The patient has worked her career as an Public house manager throughout.  It is conservatively estimated that the patient was likely operating in the high average range relative to a normative population with some specific learning and memory domains potentially more than 1 standard deviation above normative values.  We will utilize a high average range relative to population of comparisons.  Secondly, an estimation was made as to the validity of the current assessment.  The patient remained quite positive and showed good effort throughout the formal testing procedures.  Embedded validity checks also suggest that the patient tried her hardest and does not appear that there were any efforts to alter her test results.  This does appear to be a fair and valid assessment of her current status.  BNT:     Raw  Norm Score Percentile   Range  [place] Naming Test (BNT-2)   33 t = 30 2 %ile Below Average      COWAT:  FAS total= 11 Z= -1.90 Animals total= 7 Z= -2.30   R (DH) Average= 6.6 Percentile Rank= 24 L (NDH) Average= 6.6 Percentile Rank= 21  Grooved Pegboard:  R (DH) time= 225s Drops= 0 Percentile Rank= 13 L (NDH) time= 300s (completed at mark)  Drops=1 Percentile Rank=  20   WAIS-IV:              Composite Score Summary          Scale Sum of Scaled Scores Composite Score Percentile Rank 95% Conf. Interval Qualitative Description  Verbal Comprehension 14 VCI 70 2 66-77 Borderline  Perceptual Reasoning 13 PRI 67 1 62-75 Extremely Low  Working Memory 6 WMI 60 0.4 56-69 Extremely Low  Processing Speed 5 PSI 59 0.3 55-71 Extremely Low  Full Scale 38 FSIQ 59 0.3 56-64 Extremely Low  General Ability 27 GAI 65 1 61-71 Extremely Low            Verbal Comprehension Subtests Summary        Subtest Raw Score Scaled Score Percentile Rank Reference Group Scaled Score SEM  Similarities 12 5 5 4  0.95  Vocabulary 12 4 2 4  0.67  Information 6 5 5 6  0.73             Perceptual Reasoning Subtests Summary        Subtest Raw Score Scaled Score Percentile Rank Reference Group Scaled Score SEM  Block Design 16 6 9 4  0.99  Matrix Reasoning 3 3 1 1  0.90  Visual Puzzles 4 4 2 2  0.99          Working Comptroller Raw Score Scaled Score Percentile Rank Reference Group Scaled Score SEM  Digit Span 10 2 0.4 1 0.73  Arithmetic 6 4 2 4  1.20          Processing Speed Subtests Summary        Subtest Raw Score Scaled Score Percentile Rank Reference Group Scaled Score SEM  Symbol Search 3 1 0.1 1 1.12  Coding 19 4 2 1  1.12    WMS-IV:         Index Score Summary        Index Sum of Scaled Scores Index Score Percentile Rank 95% Confidence Interval Qualitative Descriptor  Auditory Memory (AMI) 21 72 3 67-80 Borderline  Visual Memory (VMI) 8 66 1 62-72 Extremely Low  Immediate Memory (IMI) 17 73 4 68-81 Borderline  Delayed Memory (DMI) 12 63 1 58-74 Extremely Low           Primary Subtest Scaled Score Summary       Subtest Domain Raw Score Scaled Score Percentile Rank  Logical Memory I AM 18 5 5   Logical Memory II AM 7 5 5   Verbal Paired Associates I AM 7 5 5   Verbal Pa[employer] Associates II AM 3 6 9   Visual Reproduction I VM 24 7 16    Visual Reproduction II VM 0 1 0.1  Symbol Span VWM 7 5 5           Auditory Memory Process Score Summary      Process Score Raw Score Scaled Score Percentile Rank Cumulative Percentage (Base Rate)  LM II Recognition 16 - - 17-25%  VPA II Recognition 22 - - 3-9%  Visual Memory Process Score Summary      Process Score Raw Score Scaled Score Percentile Rank Cumulative Percentage (Base Rate)  VR II Recognition 5 - - 51-75%    Summary of Results:   To objectively assess the patient's expressive language capacity, she was administered both the [employer] (BNT) and the Controlled Oral Word Association Test (COWAT).  On the [employer], which is a targeted naming measure, the patient performed significantly below expectations relative to her estimated premorbid level of functioning. Her performance fell in the low average range, at approximately the 2nd percentile compared to normative data.  Similar difficulties with expressive language were observed on the Controlled Oral Word Association Test. For example, on the FAS test, which evaluates lexical fluency, the patient was able to produce only 11 words within the time limit. On the [employer], which assesses semantic fluency, she was able to produce only 7 words. These scores fall between 1.9 and 2.3 standard deviations below the mean when compared to age-, gender-, and education-matched normative groups.  While the patient demonstrated adequate receptive language abilities, she showed clear deficits in lexical fluency, semantic fluency, and targeted naming capacity.  To assess fine motor control and motor speed, and to explore potential lateralized motor deficits, the patient was administered both the Finger Tapping Test and the Grooved Pegboard Test.  Results indicated slow performance in the lower end of the average range to mild weakness in fine motor speed. There were also indications of reduced fine motor  control in both her right dominant and left non-dominant hands. This was particularly evident in her left non-dominant hand, where she required the full five-minute time allotment to complete the task.  There was no clear evidence of lateralization in fine motor control or speed between the right and left hands.  The Wechsler Adult Intelligence Scale (WAIS) was administered to provide a comprehensive assessment of the patient's cognitive functioning across multiple domains. Two global composite scores were calculated: the Full Scale IQ (FSIQ) and the General Ability Index (GAI). Given the patient's reported cognitive decline over recent years, these scores should not be interpreted as indicative of lifelong intellectual functioning.  The patient achieved a Full Scale IQ score of 59 and a General Ability Index score of 65, both of which fall in the extremely low range relative to normative data, and are significantly below expectations based on premorbid estimations. All individual composite scores were generally consistent, ranging from the borderline to extremely low ranges.  Her relative strength was observed in verbal comprehension, including aspects of verbal reasoning, vocabulary, and general knowledge, although these remained in the borderline range, approximately three standard deviations below predicted premorbid levels.  The most pronounced deficits were found in processing speed, executive functioning, auditory encoding, and her ability to process information actively in the auditory register. She demonstrated significant impairments in visual scanning, visual search, mental speed, and visual reasoning/problem-solving abilities. Additionally, she struggled with part-whole cognitive tasks and nonverbal reasoning.  On the Wechsler Memory Scale - Fourth Edition (WMS-IV), which evaluates a broad range of memory and learning abilities, the patient showed notable weaknesses in primary visual encoding  and her capacity to process visual information.  She consistently performed poorly on memory tasks, indicating difficulty with storing and organizing new information, consistent with her general cognitive profile.  Specifically, the patient earned an Auditory Memory Index score of 72, placing her in the 3rd percentile (borderline range), and a Visual Memory Index score of 66,  which is at the 1st percentile (extremely low range). Both immediate and delayed memory were impaired, with greater deficits in delayed memory. Her Delayed Memory Index score was 63, also at the 1st percentile, falling within the extremely low range.  While some improvement was noted in visual memory under recognition and cued recall conditions, there was no significant improvement in auditory memory performance on similar cued learning tasks.  Impression/Diagnosis:   Overall, the objective neuropsychological evaluation shows significant deficits across nearly all cognitive domains, approximately three standard deviations below premorbid estimates. These findings are consistent with her self-reported cognitive deficits. The decline has been noted over the past 3-4 years, with an acute worsening in the last six months. There is no indication of lateralization from visual, auditory, or motor testing. Significant attentional deficits are impacting the ability to store and organize new information, with the greatest deficits observed in delayed memory for both visual and auditory information.  The diagnostic picture is challenging. Contributing factors include chronic pain, ongoing sleep disturbance (recent non-use of CPAP due to dental issues), anxiety/depression, and chronic PTSD. Geographic disorientation and significant deficits in expressive and receptive language are present. She denies auditory/visual hallucinations and tremors.  The neuropsychological test profile is consistent with an Alzheimer's-type pathology, given that  domains beyond memory and learning (reasoning, problem-solving, attention, visuospatial/constructional skills) are also impaired. However, there are no known genetic risk factors (APOE4 genotyping) or blood tau protein analysis results suggesting increased risk. The pattern of cognitive decline over 3-4 years with acute worsening in the last six months aligns with Alzheimer's-type pathology. The presentation is not consistent with Lewy body dementia or cerebral vascular dementia. An MRI cannot be performed due to an in-place spinal cord stimulator lead.  Medication for migraine history, Topamax , is being considered for discontinuation. She continues to take Aricept  as prescribed by neurology, without reported side effects.  Plan: - Recommend further blood work to rule out nutritional deficiencies, specifically checking niacin and thiamin levels, given the history of gastrointestinal issues and a mild reduction in B12 noted in past labs. - Formal diagnosis is moderate cognitive disorder. While criteria for major neurocognitive disorder/dementia are met, the specific etiology is unclear at this time. - Schedule repeat neuropsychological testing in approximately 9 months to assess for changes and further consider an Alzheimer's-type pathology. - Review results and recommendations of the current evaluation with her.  Diagnosis:    Moderate cognitive impairment  Mild obstructive sleep apnea  Chronic pain syndrome  RESTLESS LEG SYNDROME, SEVERE  Chronic posttraumatic stress disorder   _____________________ Norleen Asa, Psy.D. Clinical Neuropsychologist

## 2024-05-17 ENCOUNTER — Ambulatory Visit: Payer: Self-pay | Admitting: Family Medicine

## 2024-05-17 ENCOUNTER — Other Ambulatory Visit (INDEPENDENT_AMBULATORY_CARE_PROVIDER_SITE_OTHER)

## 2024-05-17 DIAGNOSIS — I10 Essential (primary) hypertension: Secondary | ICD-10-CM | POA: Diagnosis not present

## 2024-05-17 LAB — CBC WITH DIFFERENTIAL/PLATELET
Basophils Absolute: 0.1 K/uL (ref 0.0–0.1)
Basophils Relative: 0.8 % (ref 0.0–3.0)
Eosinophils Absolute: 0.1 K/uL (ref 0.0–0.7)
Eosinophils Relative: 1.2 % (ref 0.0–5.0)
HCT: 40 % (ref 36.0–46.0)
Hemoglobin: 13.2 g/dL (ref 12.0–15.0)
Lymphocytes Relative: 33.8 % (ref 12.0–46.0)
Lymphs Abs: 2.4 K/uL (ref 0.7–4.0)
MCHC: 32.9 g/dL (ref 30.0–36.0)
MCV: 94.5 fl (ref 78.0–100.0)
Monocytes Absolute: 0.6 K/uL (ref 0.1–1.0)
Monocytes Relative: 8.3 % (ref 3.0–12.0)
Neutro Abs: 4.1 K/uL (ref 1.4–7.7)
Neutrophils Relative %: 55.9 % (ref 43.0–77.0)
Platelets: 184 K/uL (ref 150.0–400.0)
RBC: 4.24 Mil/uL (ref 3.87–5.11)
RDW: 15 % (ref 11.5–15.5)
WBC: 7.2 K/uL (ref 4.0–10.5)

## 2024-05-19 ENCOUNTER — Other Ambulatory Visit: Payer: Self-pay | Admitting: Family Medicine

## 2024-06-05 ENCOUNTER — Ambulatory Visit: Admitting: Psychology

## 2024-07-02 ENCOUNTER — Encounter: Payer: Self-pay | Admitting: Neurology

## 2024-07-02 ENCOUNTER — Telehealth: Payer: Self-pay | Admitting: Neurology

## 2024-07-02 ENCOUNTER — Ambulatory Visit: Admitting: Neurology

## 2024-07-02 VITALS — BP 112/64 | HR 78 | Ht 61.0 in | Wt 133.0 lb

## 2024-07-02 DIAGNOSIS — G3184 Mild cognitive impairment, so stated: Secondary | ICD-10-CM

## 2024-07-02 DIAGNOSIS — G8929 Other chronic pain: Secondary | ICD-10-CM | POA: Diagnosis not present

## 2024-07-02 DIAGNOSIS — M5441 Lumbago with sciatica, right side: Secondary | ICD-10-CM | POA: Diagnosis not present

## 2024-07-02 DIAGNOSIS — R4189 Other symptoms and signs involving cognitive functions and awareness: Secondary | ICD-10-CM

## 2024-07-02 MED ORDER — MEMANTINE HCL 5 MG PO TABS: 5.0000 mg | ORAL_TABLET | Freq: Two times a day (BID) | ORAL | 0 refills | Status: AC

## 2024-07-02 MED ORDER — MEMANTINE HCL 10 MG PO TABS: 10.0000 mg | ORAL_TABLET | Freq: Two times a day (BID) | ORAL | 2 refills | Status: AC

## 2024-07-02 NOTE — Telephone Encounter (Signed)
 Spoke w/Pt regarding refills on medication. Discussed w/Pt she has refills left. Pt stated pharmacy had given her a larger quantity than ordered but after her checking she has enough medication. Asked Pt to call pharmacy for refills so they can contact our office when it is time for refill. Pt voiced understanding and thanks for the call back.

## 2024-07-02 NOTE — Progress Notes (Signed)
 memory issues.             Provider:  Dedra Gores, MD  Primary Care Physician:  Katrinka Garnette KIDD, MD [address] [place] [address]     Referring Provider: Katrinka Garnette KIDD, Md [address] [place],  [place] [address]          Chief Complaint according to patient   Patient presents with:                HISTORY OF PRESENT ILLNESS:  Alison Brooks is a 68 y.o. female patient who is here for revisit 07/02/2024 for  memory follow up, non- AD, .    Chief concern according to patient :  My speech has improved and is still not fluent-  do I have dementia ?  CT was not indicated a vascular abnormality in the brain but the patient couldn't have a Brain MRI ( spinal cord stimulation). 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  She is presenting with haltering speech, she is eloquent and well oriented.   Her MMSE was 21 / 30 with serial seventh and was 23/ 30 with  the WORLD reversed spelling.   She has seen Dr Corina for neuropsychological testing , and she is seeing him again I November. ' He suggested to switch to Namenda .  PET scan brain-  metabolic is pending.  EEG is pending.       Alison Brooks is a 68 y.o. caucasian  female and is seen here upon referral from Dr. Katrinka for a Consultation/ Evaluation of memory loss. 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  This has alerted her to her own memory issues.  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   She described how she misplaced her checkbook even that she had just used it within 1-2 days earlier and found it in a drawer of clothing where it did not belong.  She was at home and planned getting her granddaughter picked- up from another home. The house should have been at a familiar address, but could not  remember the location - could not visualize how the house would look where she would have picked her up,  she could not mentally plan the way to the location-and, thank goodness, on that day she did not have to drive.   She states that she was in 1 of 2 [employer]'s close to her hometown but forgot at which one she was and how to get back home from there.    She describes these as very sudden events : one day she feels clear and fully oriented, the next totally disoriented and helpless.  A friend of hers had been in the nursing home for rehab and she forgot how to get there even that this location should have been familiar to her,she has driven by there many times before.   For her past medical history she has been treated elsewhere for obstructive sleep apnea is on CPAP, she has a history longstanding HX of chronic pain syndromes had been on baclofen  and had a spinal cord stimulator placed which is no longer working,  gout,  HTN, GERD on  protonix . She had Vertigo in 2018 and seen Dr. Ines.  She also has a history of OSA overlap with COPD,  pulmonary nodules, she was a smoker, had been treated for depression, hx of migraine ( now rare )., Fibromyalgia,  Kidney stones,  HTN, high Cholesterol.      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 No family history of dementia, but many family members died young of heart diseases/ CAD.  She has meanwhile started on Aricept  , 10 mg daily in AM or Om, her choice.  B 12 supplementation.    Has been on Topamax  200 mg daily ( can be a medication interfering with word finding).      Review of Systems: Out of a complete 14 system review, the patient complains of only the following symptoms, and all other reviewed systems are negative.:       Social History   Socioeconomic History   Marital status: Married    Spouse name: Dale   Number of children: 1   Years of education: [employer] education level:  Associate degree: occupational, Scientist, product/process development, or vocational program  Occupational History   Occupation: Retired Engineer, civil (consulting)  Tobacco Use   Smoking status: Former    Current packs/day: 0.00    Average packs/day: 1.3 packs/day for 40.0 years (50.0 ttl pk-yrs)    Types: Cigarettes    Start date: 06/07/1968    Quit date: 06/07/2008    Years since quitting: 16.0   Smokeless tobacco: Never  Vaping Use   Vaping status: Never Used  Substance and Sexual Activity   Alcohol use: Yes    Alcohol/week: 0.0 standard drinks of alcohol    Comment: VERY RARE   Drug use: No   Sexual activity: Not on file  Other Topics Concern   Not on file  Social History Narrative   Lives with spouse, Dale.    Caffeine use: 1-2 cups coffee per day   Married 47 years in December 2020. 1 daughter. 2 grandkids (boy and girl). Lives 10 miles away.       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       Hobbies: previously enjoyed dancing, old car shoes (26 chevy), reading   Social Drivers of Corporate investment banker Strain: Low Risk  (04/30/2024)   Overall Financial Resource Strain (CARDIA)    Difficulty of Paying Living Expenses: Not hard at all  Food Insecurity: No Food Insecurity (04/30/2024)   Hunger Vital Sign    Worried About Running Out of Food in the Last Year: Never true    Ran Out of Food in the Last Year: Never true  Transportation Needs: No Transportation Needs (04/30/2024)   PRAPARE - Administrator, Civil Service (Medical): No    Lack of Transportation (Non-Medical): No  Physical Activity: Inactive (04/30/2024)   Exercise Vital Sign    Days of Exercise per Week: 0 days    Minutes of Exercise per Session: Not on file  Stress: No Stress Concern Present (04/30/2024)   [employer] of Occupational Health - Occupational Stress Questionnaire    Feeling of Stress: Only a little  Social Connections: Moderately Isolated (04/30/2024)   Social Connection and Isolation Panel    Frequency of Communication  with Friends and Family: Three times a week    Frequency of Social Gatherings with Friends and Family: Once a week    Attends Religious Services: Never    Database administrator or Organizations: No    Attends Banker Meetings: Not on file  Marital Status: Married    Family History  Problem Relation Age of Onset   Heart attack Mother        Mom 70, Dad 51   Hypertension Mother    Heart disease Mother    Heart disease Father    Diabetes Father    Colon cancer Maternal Grandmother        not sure age of onset   Esophageal cancer Neg Hx    Rectal cancer Neg Hx    Stomach cancer Neg Hx     Past Medical History:  Diagnosis Date   Anxiety    Arthritis    back, neck   Chronic back pain    COPD (chronic obstructive pulmonary disease) (HCC) 2021   after Covid   Depression    Dyspnea    Since covid , NOW ON INHALERS   Fatty liver    Fibromyalgia    GERD (gastroesophageal reflux disease)    Heart murmur    Hematuria    History of adenomatous polyp of colon    History of COVID-19 10/2020   History of kidney stones    History of seizure    x1 2007 post op (per pt neurologist work-up done ?mixture of anesthesia medication and prozac  that pt had taken)  and has not any issues since    History of squamous cell carcinoma excision    Hyperlipidemia    Hypertension    Hypothyroidism    IC (interstitial cystitis)    Long COVID    Migraine    Osteoporosis    Pulmonary nodules    multiple per ct -- monitored by pcp   Right ureteral stone    RLS (restless legs syndrome)     Past Surgical History:  Procedure Laterality Date   CARDIOVASCULAR STRESS TEST  09/02/2004   normal perfusion study/  normal LV function and wall motion, ef 64%   CARPAL TUNNEL RELEASE Right 1998   CERVICAL FUSION  1992   CHOLECYSTECTOMY  1995   COLONOSCOPY     CYSTO/  BOTOX INJECTION  12-27-2008  &  04-01-2006   CYSTO/  HYDRODISTENTION/  INSTILLATION THERAPY  03/04/2006   CYSTOSCOPY W/  URETERAL STENT PLACEMENT Right 04/02/2016   Procedure: CYSTOSCOPY WITH RIGHT RETROGRADE, URETEROSCOPY PYELOGRAM LASER LITHOTRIPSY,/URETERAL STENT PLACEMENT;  Surgeon: Mark Ottelin, MD;  Location: [facility];  Service: Urology;  Laterality: Right;   HEMORRHOID SURGERY  01/03/2004   HOLMIUM LASER APPLICATION Right 04/02/2016   Procedure: HOLMIUM LASER APPLICATION;  Surgeon: Mark Ottelin, MD;  [facility];  Service: Urology;  Laterality: Right;   LAPAROSCOPIC RIGHT HEMI COLECTOMY N/A 06/12/2021   Procedure: LAPAROSCOPIC RIGHT HEMI COLECTOMY;  Surgeon: Teresa Lonni HERO, MD;  Location: [facility];  Service: General;  Laterality: N/A;   LUMBAR FUSION  1996   L4 --S1   ROTATOR CUFF REPAIR Left 2004   SHOULDER ARTHROSCOPY WITH BICEPS TENDON REPAIR Left 12/2015   SPINAL CORD STIMULATOR IMPLANT  x4  last one 2013   STONE EXTRACTION WITH BASKET Right 04/02/2016   Procedure: STONE EXTRACTION WITH BASKET;  Surgeon: Mark Ottelin, MD;  Location: [facility];  Service: Urology;  Laterality: Right;   TONSILLECTOMY  1964   TOTAL HIP ARTHROPLASTY Right 05/18/2022   Procedure: TOTAL HIP ARTHROPLASTY ANTERIOR APPROACH;  Surgeon: Beverley Evalene BIRCH, MD;  Location: [facility];  Service: Orthopedics;  Laterality: Right;   TRANSTHORACIC ECHOCARDIOGRAM  04/17/2014   EF 60-65%/  trivial AR, MR,  and TR   UPPER GASTROINTESTINAL ENDOSCOPY     VAGINAL HYSTERECTOMY  1984   WRIST GANGLION EXCISION Right 02/05/2004     Current Outpatient Medications on File Prior to Visit  Medication Sig Dispense Refill   acetaminophen  (TYLENOL ) 500 MG tablet Take 2 tablets (1,000 mg total) by mouth every 6 (six) hours as needed for moderate pain or mild pain. 60 tablet 0   albuterol  (VENTOLIN  HFA) 108 (90 Base) MCG/ACT inhaler Inhale 2 puffs into the lungs every 6 (six) hours as needed. 18 g 5   allopurinol  (ZYLOPRIM ) 100 MG tablet TAKE 1 TABLET BY MOUTH DAILY 100 tablet 2   amLODipine   (NORVASC ) 2.5 MG tablet TAKE 1 TABLET BY MOUTH  DAILY 90 tablet 3   baclofen  (LIORESAL ) 10 MG tablet Take 10 mg by mouth 3 (three) times daily. PRN     Cholecalciferol (VITAMIN D) 50 MCG (2000 UT) tablet Take 2,000 Units by mouth daily.     citalopram  (CELEXA ) 20 MG tablet Take 1 tablet (20 mg total) by mouth daily. 90 tablet 3   cyanocobalamin  (VITAMIN B12) 1000 MCG/ML injection 1000 mcg (1 mg) injection once per per month or as directed. For 1 month starting may 2025 take weekly then monthly again 30 mL 1   diclofenac  sodium (VOLTAREN ) 1 % GEL Apply 4 g topically 4 (four) times daily. 100 g 1   donepezil  (ARICEPT ) 10 MG tablet Take 1 tablet (10 mg total) by mouth at bedtime. 30 tablet 5   ezetimibe  (ZETIA ) 10 MG tablet TAKE 1 TABLET BY MOUTH DAILY 100 tablet 2   famotidine  (PEPCID ) 20 MG tablet Take 1 tablet (20 mg total) by mouth at bedtime. OFFICE VISIT FOR FURTHER REFILLS 90 tablet 1   fluticasone  (FLONASE ) 50 MCG/ACT nasal spray Place 2 sprays into both nostrils daily. 18.2 mL 2   levothyroxine  (SYNTHROID , LEVOTHROID) 50 MCG tablet Take 50 mcg by mouth daily before breakfast.     pantoprazole  (PROTONIX ) 40 MG tablet TAKE 1 TABLET BY MOUTH  TWICE DAILY 30 TO 60  MINUTES BEFORE MEALS OFFICE VISIT FOR FURTHER REFILLS!!!!! 90 tablet 1   rosuvastatin  (CRESTOR ) 40 MG tablet TAKE 1 TABLET BY MOUTH DAILY 100 tablet 2   Tiotropium Bromide  Monohydrate (SPIRIVA  RESPIMAT) 2.5 MCG/ACT AERS INHALE 2 PUFFS INTO THE LUNGS ONCE DAILY. 4 g 11   topiramate  (TOPAMAX ) 200 MG tablet Take 1 tablet (200 mg total) by mouth daily. 90 tablet 3   [DISCONTINUED] orlistat  (XENICAL ) 120 MG capsule Take 1 capsule (120 mg total) by mouth 3 (three) times daily with meals. 90 capsule 5   No current facility-administered medications on file prior to visit.    Allergies  Allergen Reactions   Latex Other (See Comments)    blisters   Zofran  [Ondansetron  Hcl] Nausea And Vomiting and Other (See Comments)    headache      DIAGNOSTIC DATA (LABS, IMAGING, TESTING) - I reviewed patient records, labs, notes, testing and imaging myself where available.  Lab Results  Component Value Date   WBC 7.2 05/17/2024   HGB 13.2 05/17/2024   HCT 40.0 05/17/2024   MCV 94.5 05/17/2024   PLT 184.0 05/17/2024      Component Value Date/Time   NA 140 05/04/2024 1435   NA 143 01/19/2024 1636   K 3.6 05/04/2024 1435   CL 109 05/04/2024 1435   CO2 25 05/04/2024 1435   GLUCOSE 89 05/04/2024 1435   BUN 13 05/04/2024 1435   BUN 10 01/19/2024 1636  CREATININE 0.74 05/04/2024 1435   CREATININE 0.77 05/23/2020 1635   CALCIUM  8.6 05/04/2024 1435   PROT 6.5 05/04/2024 1435   PROT 6.1 01/19/2024 1636   ALBUMIN 4.1 05/04/2024 1435   ALBUMIN 4.2 01/19/2024 1636   AST 49 (H) 05/04/2024 1435   ALT 74 (H) 05/04/2024 1435   ALKPHOS 102 05/04/2024 1435   BILITOT 0.5 05/04/2024 1435   BILITOT 0.3 01/19/2024 1636   GFRNONAA >60 01/13/2024 1804   GFRAA 110.1 09/26/2020 0000   Lab Results  Component Value Date   CHOL 136 03/28/2023   HDL 49.80 03/28/2023   LDLCALC 65 03/28/2023   LDLDIRECT 145.0 09/22/2022   TRIG 105.0 03/28/2023   CHOLHDL 3 03/28/2023   Lab Results  Component Value Date   HGBA1C 6.0 05/04/2024   Lab Results  Component Value Date   VITAMINB12 209 (L) 05/04/2024   Lab Results  Component Value Date   TSH 1.730 01/19/2024    PHYSICAL EXAM:  The patient presents with a rapid decline in cognitive function, particularly memory and language, which appears to be precipitated by an unknown trigger approximately six months ago, though subtle symptoms were present for three to four years. Factors that seem to have predisposed the patient to the problem include multiple chronic medical conditions, chronic pain, and a history of depression and anxiety. The current problem is maintained by poor sleep quality secondary to OSA and nightmares, CPAP non-compliance, and anxiety. However, the protective and positive  factors include a supportive spouse, past intellectual strengths, good insight, and normal Alzheimer's-related biomarkers.   The patient has been set up for formal neuropsychological evaluation including the Wechsler Adult Intelligence Scale, Wechsler Memory Scale, controlled oral Word Association test, [employer], finger tapping Test and grooved pegboard test.  Once this is completed a formal report will be produced and made available in the patient's EMR and forwarded to her referring neurologist.   Vitals:   07/02/24 1322  BP: 112/64  Pulse: 78   No data found. Body mass index is 25.13 kg/m.   Wt Readings from Last 3 Encounters:  07/02/24 133 lb (60.3 kg)  05/04/24 133 lb 12.8 oz (60.7 kg)  03/08/24 136 lb (61.7 kg)     Ht Readings from Last 3 Encounters:  07/02/24 5' 1 (1.549 m)  05/04/24 5' 1 (1.549 m)  03/08/24 5' 1 (1.549 m)      General: The patient is awake, alert and appears not in acute distress and groomed. Head: Normocephalic, atraumatic.  Neck is supple. General: The patient is awake, alert and appears not in acute distress.  The patient is well groomed. Head: Normocephalic, atraumatic.  Neck is supple. No fusion surgery Next goiter   Neck circumference:  14'5'  after fusion.  Cardiovascular:  Regular rate and palpable peripheral pulse:  Respiratory: clear to auscultation Mallampati , Skin:  Without evidence of edema, or rash Trunk: BMI is 26.2   and patient  has normal posture.     Neurologic exam : The patient is awake and alert, oriented to place and time.  Memory subjective  described as impaired .mini-mental status exam    07/02/2024    1:24 PM 01/19/2024    3:33 PM  MMSE - Mini Mental State Exam  Orientation to time 3 3  Orientation to Place 3 5  Registration 3 3  Attention/ Calculation 4/ WORLD 1  Recall 2 1  Language- name 2 objects 2 2  Language- repeat 1 0  Language- follow  3 step command 3 3  Language- read & follow direction 1 1   Write a sentence 1 1  Copy design 0 0  Total score 23 20        There is a reduced  attention span & concentration ability here today,  Speech is non-fluent with dysphonia, stammering, not really having work finding problems,   Mood and affect are anxious.    Cranial nerves: Pupils are equal and briskly reactive to light. Funduscopic exam without  evidence of pallor or edema. Extraocular movements  in vertical and horizontal planes intact and without nystagmus. Visual fields by finger perimetry are intact. Hearing to finger rub intact.  Facial sensation intact to fine touch. Facial motor strength is symmetric and tongue and uvula move midline.   Motor exam:   Normal tone and normal muscle bulk and symmetric normal strength in all extremities. Grip Strength  bilaterally reduced.  Carpal tunnel- hx  Proximal strength of shoulder muscles and hip flexors was intact. .   Sensory:  Fine touch and vibration were tested . Proprioception was tested in the upper extremities only and was  normal.   Coordination: Rapid alternating movements in the fingers/hands were normal.  ASSESSMENT AND PLAN :   68 y.o. year old female  here with:   Chief concern according to patient :  My speech has improved and is still not fluent-  do I have dementia ?  CT was not indicated a vascular abnormality in the brain but the patient couldn't have a Brain MRI ( spinal cord stimulation). 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  She is presenting with haltering speech, she is eloquent and well oriented.    Speech therapy is recommended.   Her MMSE was 21 / 30 with serial seventh and was 23/ 30 with  the WORLD reversed spelling.   She has seen Dr Corina for neuropsychological testing , and she is seeing him again I November. ' He suggested to switch to Namenda .  PET scan brain-  metabolic is pending.  EEG is pending.     Orders Placed This Encounter   Procedures   NM PET Metabolic Brain    ATN and Alzheimer genetic were negative, Patient has a spinal cord stimulator and cannot go through MRI. Vascular dementia not likely , given CT brain 12-2023 was normal.  EEG ordered.    Standing Status:   Future    Expiration Date:   07/02/2025    If indicated for the ordered procedure, I authorize the administration of a radiopharmaceutical per Radiology protocol:   Yes    Preferred imaging location?:   [facility]    Release to patient:   Immediate   EEG adult    Standing Status:   Future    Expiration Date:   07/02/2025    Scheduling Instructions:     Transient amnesia, multiple brief events since beginning of the year.    Where should this test be performed?:   [facility]    Reason for exam:   Altered mental status    Release to patient:   Immediate   Follow up after Dr Cass final visit:   No visual hallucination when I asked her, but she insisted she saw a black bear at the garbage can, and her husband was not there to confirm.    Started namenda .    I would like to thank Katrinka Garnette KIDD, MD and Katrinka Garnette KIDD,  [place] [address] [place],  [place] [address] for allowing me to meet with this pleasant patient.       The patient will be seen in follow-up . The referring provider will be notified of the test results.   The patient's condition requires frequent monitoring and adjustments in the treatment plan, reflecting the ongoing complexity of care.  This provider is the continuing focal point for all needed services for this condition.  After spending a total time of  35  minutes face to face and time for  history taking, physical and neurologic examination, review of laboratory studies,  personal review of imaging studies, reports and results of other testing and review of referral information / records as far as provided in visit,   Electronically signed by: Dedra Gores, MD 07/02/2024 2:03 PM  [facility]  and [facility] Board certified by The [employer] of Sleep Medicine and Diplomate of the [employer] of Sleep Medicine. Board certified In Neurology through the ABPN, Fellow of the [employer] of Neu[employer].

## 2024-07-02 NOTE — Telephone Encounter (Signed)
 Pt called to request Medication refill donepezil  (ARICEPT ) 10 MG tablet    Pt would like medication to be sent to :   [facility] [place], [place] - [address] [place] (Ph: [phone]

## 2024-07-02 NOTE — Patient Instructions (Addendum)
 Memantine  Tablets What is this medication? MEMANTINE  (MEM an teen) treats memory loss and confusion (dementia) in people who have Alzheimer disease. It works by improving attention, memory, and the ability to engage in daily activities. It is not a cure for dementia or Alzheimer disease. This medicine may be used for other purposes; ask your health care provider or pharmacist if you have questions. COMMON BRAND NAME(S): Namenda  What should I tell my care team before I take this medication? They need to know if you have any of these conditions: Kidney disease Liver disease Seizures Trouble passing urine An unusual or allergic reaction to memantine , other medications, foods, dyes, or preservatives Pregnant or trying to get pregnant Breast-feeding How should I use this medication? Take this medication by mouth with water. Follow the directions on the prescription label. You may take this medication with or without food. Take your doses at regular intervals. Do not take your medication more often than directed. Continue to take your medication even if you feel better. Do not stop taking except on the advice of your care team. Talk to your care team about the use of this medication in children. Special care may be needed Overdosage: If you think you have taken too much of this medicine contact a poison control center or emergency room at once. NOTE: This medicine is only for you. Do not share this medicine with others. What if I miss a dose? If you miss a dose, take it as soon as you can. If it is almost time for your next dose, take only that dose. Do not take double or extra doses. If you do not take your medication for several days, contact your care team. Your dose may need to be changed. What may interact with this medication? Acetazolamide Amantadine Cimetidine Dextromethorphan Dofetilide Hydrochlorothiazide Ketamine  Metformin Methazolamide Quinidine Ranitidine Sodium  bicarbonate Triamterene This list may not describe all possible interactions. Give your health care provider a list of all the medicines, herbs, non-prescription drugs, or dietary supplements you use. Also tell them if you smoke, drink alcohol, or use illegal drugs. Some items may interact with your medicine. What should I watch for while using this medication? Visit your care team for regular checks on your progress. Check with your care team if there is no improvement in your symptoms or if they get worse. This medication may affect your coordination, reaction time, or judgment. Do not drive or operate machinery until you know how this medication affects you. Sit up or stand slowly to reduce the risk of dizzy or fainting spells. Drinking alcohol with this medication can increase the risk of these side effects. What side effects may I notice from receiving this medication? Side effects that you should report to your care team as soon as possible: Allergic reactions--skin rash, itching, hives, swelling of the face, lips, tongue, or throat Side effects that usually do not require medical attention (report to your care team if they continue or are bothersome): Confusion Constipation Diarrhea Dizziness Headache This list may not describe all possible side effects. Call your doctor for medical advice about side effects. You may report side effects to FDA at 1-800-FDA-1088. Where should I keep my medication? Keep out of the reach of children. Store at room temperature between 15 degrees and 30 degrees C (59 degrees and 86 degrees F). Throw away any unused medication after the expiration date. NOTE: This sheet is a summary. It may not cover all possible information. If you have  questions about this medicine, talk to your doctor, pharmacist, or health care provider.  2024 Elsevier/Gold Standard (2021-11-03 00:56:61)68 y.o. year old female  here with:     Chief concern according to patient :  My  speech has improved and is still not fluent-  do I have dementia ?   CT was not indicated a vascular abnormality in the brain but the patient couldn't have a Brain MRI ( spinal cord stimulation). 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  She is presenting with haltering speech, she is eloquent and well oriented.     Speech therapy is recommended.    Her MMSE was 21 / 30 with serial seventh and was 23/ 30 with  the WORLD reversed spelling.    She has seen Dr Corina for neuropsychological testing , and she is seeing him again I November. ' He suggested to switch to Namenda .  PET scan brain-  metabolic is pending.  EEG is pending.   Rv in 6 months.

## 2024-07-03 ENCOUNTER — Telehealth: Payer: Self-pay | Admitting: Neurology

## 2024-07-03 ENCOUNTER — Other Ambulatory Visit: Payer: Self-pay | Admitting: Family Medicine

## 2024-07-03 NOTE — Telephone Encounter (Signed)
 [employer] medicare shara: [id number] exp. 07/03/24-08/17/24 sent to [facility] [phone]

## 2024-07-12 ENCOUNTER — Encounter (HOSPITAL_COMMUNITY)
Admission: RE | Admit: 2024-07-12 | Discharge: 2024-07-12 | Disposition: A | Discharge status: Home / Self Care | Source: Ambulatory Visit | Attending: Neurology | Admitting: Neurology

## 2024-07-12 DIAGNOSIS — G3184 Mild cognitive impairment, so stated: Secondary | ICD-10-CM | POA: Diagnosis not present

## 2024-07-12 DIAGNOSIS — G309 Alzheimer's disease, unspecified: Secondary | ICD-10-CM | POA: Diagnosis not present

## 2024-07-12 DIAGNOSIS — R4189 Other symptoms and signs involving cognitive functions and awareness: Secondary | ICD-10-CM | POA: Diagnosis present

## 2024-07-12 DIAGNOSIS — M5441 Lumbago with sciatica, right side: Secondary | ICD-10-CM | POA: Diagnosis not present

## 2024-07-12 DIAGNOSIS — G8929 Other chronic pain: Secondary | ICD-10-CM | POA: Diagnosis not present

## 2024-07-12 MED ORDER — FLUDEOXYGLUCOSE F - 18 (FDG) INJECTION
10.1100 | Freq: Once | INTRAVENOUS | Status: AC | PRN
  Administered 2024-07-12: 10.11 via INTRAVENOUS

## 2024-07-16 ENCOUNTER — Ambulatory Visit: Payer: Self-pay | Admitting: Neurology

## 2024-07-18 ENCOUNTER — Telehealth: Payer: Self-pay | Admitting: Neurology

## 2024-07-18 MED ORDER — DONEPEZIL HCL 10 MG PO TABS: 10.0000 mg | ORAL_TABLET | Freq: Every day | ORAL | 5 refills | Status: DC

## 2024-07-18 NOTE — Telephone Encounter (Signed)
 Pt called to request Medication rerfill, Pt states Pharmacy informed PT T donepezil  (ARICEPT ) 10 MG tablet   Pt would like medication to be sent to   [facility]) - [place], [place] - [address] Phone: [phone]  Fax: [phone]

## 2024-07-18 NOTE — Telephone Encounter (Signed)
 refilled

## 2024-07-24 ENCOUNTER — Ambulatory Visit: Admitting: Neurology

## 2024-07-24 DIAGNOSIS — R4182 Altered mental status, unspecified: Secondary | ICD-10-CM

## 2024-07-24 DIAGNOSIS — R4189 Other symptoms and signs involving cognitive functions and awareness: Secondary | ICD-10-CM

## 2024-07-24 DIAGNOSIS — G8929 Other chronic pain: Secondary | ICD-10-CM

## 2024-07-24 DIAGNOSIS — G3184 Mild cognitive impairment, so stated: Secondary | ICD-10-CM

## 2024-07-27 NOTE — Procedures (Signed)
 [facility]  EEG (ELECTROENCEPHALOGRAM) REPORT  Kellam, Barnie BECKER   ORDERING CLINICIAN: Dedra Gores, M.D.  TECHNOLOGIST: Burnard Plummer, REEGT TECHNIQUE:  This EEG study was done with scalp electrodes positioned according to the 10-20 International system of electrode placement. Electrical activity was reviewed with band pass filter of 1-70Hz , sensitivity of 7 uV/mm, display speed of 73mm/sec with a 60Hz  notched filter applied as appropriate. EEG data were recorded continuously and digitally stored.    Recoding duration : 27 m 52 sec Activation included:  Photic stimulation : Hyperventilation :    Description: The EEG's posterior dominant background rhythm of 11 hertz was symmetrically displayed while the patient's eyes were closed and promptly attenuated with eye opening. At baseline, the recording showed a moderate amplitude in symmetric fashion.  Photic stimulation was initiated at frequencies from 3- through 21 hertz, resulting in photic entrainment at  all frequencies without periodic or rhythmic discharges, or epileptiform activity.  Hyperventilation maneuver was initiated,  leading to amplitude build-up without slowing.  Following hyperventilation the patient's EEG was reviewed for a period of 1 and 2 minutes post maneuver, and indicated  relaxation/ drowsiness but no sleep was captured.    The patient was recorded while awake, while drowsy.  ECG: heart rate at  52-60 bpm in NSR   IMPRESSION:  This is a normal EEG.   Dr. Dedra Gores, M.D. Accredited by the ABPN, ABS[employer].

## 2024-07-31 ENCOUNTER — Ambulatory Visit: Payer: Medicare Other

## 2024-07-31 VITALS — Ht 61.0 in | Wt 133.0 lb

## 2024-07-31 DIAGNOSIS — Z0000 Encounter for general adult medical examination without abnormal findings: Secondary | ICD-10-CM

## 2024-07-31 NOTE — Patient Instructions (Signed)
 Alison Brooks,  Thank you for taking the time for your Medicare Wellness Visit. I appreciate your continued commitment to your health goals. Please review the care plan we discussed, and feel free to reach out if I can assist you further.  Medicare recommends these wellness visits once per year to help you and your care team stay ahead of potential health issues. These visits are designed to focus on prevention, allowing your provider to concentrate on managing your acute and chronic conditions during your regular appointments.  Please note that Annual Wellness Visits do not include a physical exam. Some assessments may be limited, especially if the visit was conducted virtually. If needed, we may recommend a separate in-person follow-up with your provider.  Ongoing Care Seeing your primary care provider every 3 to 6 months helps us  monitor your health and provide consistent, personalized care.   Referrals If a referral was made during today's visit and you haven't received any updates within two weeks, please contact the referred provider directly to check on the status.  Recommended Screenings:  Health Maintenance  Topic Date Due   Flu Shot  05/25/2024   COVID-19 Vaccine (4 - 2025-26 season) 06/25/2024   Medicare Annual Wellness Visit  07/25/2024   Zoster (Shingles) Vaccine (1 of 2) 08/04/2024*   Breast Cancer Screening  04/09/2025   Colon Cancer Screening  08/24/2025   DTaP/Tdap/Td vaccine (4 - Td or Tdap) 04/20/2028   Pneumococcal Vaccine for age over 92  Completed   DEXA scan (bone density measurement)  Completed   Hepatitis C Screening  Completed   Meningitis B Vaccine  Aged Out   Screening for Lung Cancer  Discontinued  *Topic was postponed. The date shown is not the original due date.       01/13/2024    5:44 PM  Advanced Directives  Does Patient Have a Medical Advance Directive? No   Advance Care Planning is important because it: Ensures you receive medical care that  aligns with your values, goals, and preferences. Provides guidance to your family and loved ones, reducing the emotional burden of decision-making during critical moments.  Vision: Annual vision screenings are recommended for early detection of glaucoma, cataracts, and diabetic retinopathy. These exams can also reveal signs of chronic conditions such as diabetes and high blood pressure.  Dental: Annual dental screenings help detect early signs of oral cancer, gum disease, and other conditions linked to overall health, including heart disease and diabetes.  Please see the attached documents for additional preventive care recommendations.

## 2024-07-31 NOTE — Progress Notes (Signed)
 Subjective:   Alison Brooks is a 68 y.o. who presents for a Medicare Wellness preventive visit.  As a reminder, Annual Wellness Visits don't include a physical exam, and some assessments may be limited, especially if this visit is performed virtually. We may recommend an in-person follow-up visit with your provider if needed.  Visit Complete: Virtual I connected with  Alison Brooks on 07/31/24 by a audio enabled telemedicine application and verified that I am speaking with the correct person using two identifiers.  Patient Location: Home  Provider Location: Office/Clinic  I discussed the limitations of evaluation and management by telemedicine. The patient expressed understanding and agreed to proceed.  Vital Signs: Because this visit was a virtual/telehealth visit, some criteria may be missing or patient reported. Any vitals not documented were not able to be obtained and vitals that have been documented are patient reported.    Persons Participating in Visit: Patient.  AWV Questionnaire: No: Patient Medicare AWV questionnaire was not completed prior to this visit.  Cardiac Risk Factors include: advanced age (>65men, >3 women);dyslipidemia;hypertension     Objective:    Today's Vitals   07/31/24 1001  Weight: 133 lb (60.3 kg)  Height: 5' 1 (1.549 m)   Body mass index is 25.13 kg/m.     07/31/2024   10:07 AM 01/13/2024    5:44 PM 07/26/2023   10:27 AM 07/02/2022   10:40 AM 05/20/2022   11:27 AM 05/18/2022    2:00 PM 05/06/2022    9:23 AM  Advanced Directives  Does Patient Have a Medical Advance Directive? No No No No No No No  Would patient like information on creating a medical advance directive? No - Patient declined  No - Patient declined No - Patient declined No - Patient declined No - Patient declined No - Patient declined    Current Medications (verified) Outpatient Encounter Medications as of 07/31/2024  Medication Sig   acetaminophen  (TYLENOL ) 500 MG  tablet Take 2 tablets (1,000 mg total) by mouth every 6 (six) hours as needed for moderate pain or mild pain.   albuterol  (VENTOLIN  HFA) 108 (90 Base) MCG/ACT inhaler Inhale 2 puffs into the lungs every 6 (six) hours as needed.   allopurinol  (ZYLOPRIM ) 100 MG tablet TAKE 1 TABLET BY MOUTH DAILY   amLODipine  (NORVASC ) 2.5 MG tablet TAKE 1 TABLET BY MOUTH  DAILY   baclofen  (LIORESAL ) 10 MG tablet Take 10 mg by mouth 3 (three) times daily. PRN   BACTRIM DS 800-160 MG tablet Take 1 tablet every 12 hours by oral route for 7 days.   Cholecalciferol (VITAMIN D) 50 MCG (2000 UT) tablet Take 2,000 Units by mouth daily.   citalopram  (CELEXA ) 20 MG tablet Take 1 tablet (20 mg total) by mouth daily.   cyanocobalamin  (VITAMIN B12) 1000 MCG/ML injection 1000 mcg (1 mg) injection once per per month or as directed. For 1 month starting may 2025 take weekly then monthly again   diclofenac  sodium (VOLTAREN ) 1 % GEL Apply 4 g topically 4 (four) times daily.   donepezil  (ARICEPT ) 10 MG tablet Take 1 tablet (10 mg total) by mouth at bedtime.   ezetimibe  (ZETIA ) 10 MG tablet TAKE 1 TABLET BY MOUTH DAILY   famotidine  (PEPCID ) 20 MG tablet Take 1 tablet (20 mg total) by mouth at bedtime. OFFICE VISIT FOR FURTHER REFILLS   fluticasone  (FLONASE ) 50 MCG/ACT nasal spray Place 2 sprays into both nostrils daily.   levothyroxine  (SYNTHROID , LEVOTHROID) 50 MCG tablet Take 50 mcg  by mouth daily before breakfast.   memantine  (NAMENDA ) 5 MG tablet Take 1 tablet (5 mg total) by mouth 2 (two) times daily.   pantoprazole  (PROTONIX ) 40 MG tablet TAKE 1 TABLET BY MOUTH  TWICE DAILY 30 TO 60  MINUTES BEFORE MEALS OFFICE VISIT FOR FURTHER REFILLS!!!!!   rosuvastatin  (CRESTOR ) 40 MG tablet TAKE 1 TABLET BY MOUTH DAILY   Tiotropium Bromide  Monohydrate (SPIRIVA  RESPIMAT) 2.5 MCG/ACT AERS INHALE 2 PUFFS INTO THE LUNGS ONCE DAILY.   topiramate  (TOPAMAX ) 200 MG tablet Take 1 tablet (200 mg total) by mouth daily.   memantine  (NAMENDA ) 10 MG  tablet Take 1 tablet (10 mg total) by mouth 2 (two) times daily. (Patient not taking: Reported on 07/31/2024)   [DISCONTINUED] alendronate  (FOSAMAX ) 70 MG tablet TAKE 1 TABLET BY MOUTH WEEKLY  WITH 8 OZ OF PLAIN WATER 30  MINUTES BEFORE FIRST FOOD, DRINK OR MEDS. STAY UPRIGHT FOR 30  MINS   [DISCONTINUED] orlistat  (XENICAL ) 120 MG capsule Take 1 capsule (120 mg total) by mouth 3 (three) times daily with meals.   No facility-administered encounter medications on file as of 07/31/2024.    Allergies (verified) Latex and Zofran  [ondansetron  hcl]   History: Past Medical History:  Diagnosis Date   Anxiety    Arthritis    back, neck   Chronic back pain    COPD (chronic obstructive pulmonary disease) (HCC) 2021   after Covid   Depression    Dyspnea    Since covid , NOW ON INHALERS   Fatty liver    Fibromyalgia    GERD (gastroesophageal reflux disease)    Heart murmur    Hematuria    History of adenomatous polyp of colon    History of COVID-19 10/2020   History of Brooks stones    History of seizure    x1 2007 post op (per pt neurologist work-up done ?mixture of anesthesia medication and prozac  that pt had taken)  and has not any issues since    History of squamous cell carcinoma excision    Hyperlipidemia    Hypertension    Hypothyroidism    IC (interstitial cystitis)    Long COVID    Migraine    Osteoporosis    Pulmonary nodules    multiple per ct -- monitored by pcp   Right ureteral stone    RLS (restless legs syndrome)    Past Surgical History:  Procedure Laterality Date   CARDIOVASCULAR STRESS TEST  09/02/2004   normal perfusion study/  normal LV function and wall motion, ef 64%   CARPAL TUNNEL RELEASE Right 1998   CERVICAL FUSION  1992   CHOLECYSTECTOMY  1995   COLONOSCOPY     CYSTO/  BOTOX INJECTION  12-27-2008  &  04-01-2006   CYSTO/  HYDRODISTENTION/  INSTILLATION THERAPY  03/04/2006   CYSTOSCOPY W/ URETERAL STENT PLACEMENT Right 04/02/2016   Procedure:  CYSTOSCOPY WITH RIGHT RETROGRADE, URETEROSCOPY PYELOGRAM LASER LITHOTRIPSY,/URETERAL STENT PLACEMENT;  Surgeon: Mark Ottelin, MD;  Location: [facility] ;  Service: Urology;  Laterality: Right;   HEMORRHOID SURGERY  01/03/2004   HOLMIUM LASER APPLICATION Right 04/02/2016   Procedure: HOLMIUM LASER APPLICATION;  Surgeon: Mark Ottelin, MD;  Location[facility];  Service: Urology;  Laterality: Right;   LAPAROSCOPIC RIGHT HEMI COLECTOMY N/A 06/12/2021   Procedure: LAPAROSCOPIC RIGHT HEMI COLECTOMY;  Surgeon: Teresa Lonni HERO, MD;  Location: [facility];  Service: General;  Laterality: N/A;   LUMBAR FUSION  1996   L4 --S1  ROTATOR CUFF REPAIR Left 2004   SHOULDER ARTHROSCOPY WITH BICEPS TENDON REPAIR Left 12/2015   SPINAL CORD STIMULATOR IMPLANT  x4  last one 2013   STONE EXTRACTION WITH BASKET Right 04/02/2016   Procedure: STONE EXTRACTION WITH BASKET;  Surgeon: Mark Ottelin, MD;  Location: [facility];  Service: Urology;  Laterality: Right;   TONSILLECTOMY  1964   TOTAL HIP ARTHROPLASTY Right 05/18/2022   Procedure: TOTAL HIP ARTHROPLASTY ANTERIOR APPROACH;  Surgeon: Beverley Evalene BIRCH, MD;  Location: [facility];  Service: Orthopedics;  Laterality: Right;   TRANSTHORACIC ECHOCARDIOGRAM  04/17/2014   EF 60-65%/  trivial AR, MR, and TR   UPPER GASTROINTESTINAL ENDOSCOPY     VAGINAL HYSTERECTOMY  1984   WRIST GANGLION EXCISION Right 02/05/2004   Family History  Problem Relation Age of Onset   Heart attack Mother        Mom 49, Dad 52   Hypertension Mother    Heart disease Mother    Heart disease Father    Diabetes Father    Colon cancer Maternal Grandmother        not sure age of onset   Esophageal cancer Neg Hx    Rectal cancer Neg Hx    Stomach cancer Neg Hx    Social History   Socioeconomic History   Marital status: Married    Spouse name: Dale   Number of children: 1   Years of education: [employer] education level: Associate  degree: occupational, Scientist, product/process development, or vocational program  Occupational History   Occupation: Retired Engineer, civil (consulting)  Tobacco Use   Smoking status: Former    Current packs/day: 0.00    Average packs/day: 1.3 packs/day for 40.0 years (50.0 ttl pk-yrs)    Types: Cigarettes    Start date: 06/07/1968    Quit date: 06/07/2008    Years since quitting: 16.1   Smokeless tobacco: Never  Vaping Use   Vaping status: Never Used  Substance and Sexual Activity   Alcohol use: Yes    Alcohol/week: 0.0 standard drinks of alcohol    Comment: VERY RARE   Drug use: No   Sexual activity: Not on file  Other Topics Concern   Not on file  Social History Narrative   Lives with spouse, Dale.    Caffeine use: 1-2 cups coffee per day   Married 47 years in December 2020. 1 daughter. 2 grandkids (boy and girl). Lives 10 miles away.       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       Hobbies: previously enjoyed dancing, old car shoes (62 chevy), reading   Social Drivers of Corporate investment banker Strain: Low Risk  (07/31/2024)   Overall Financial Resource Strain (CARDIA)    Difficulty of Paying Living Expenses: Not hard at all  Food Insecurity: No Food Insecurity (07/31/2024)   Hunger Vital Sign    Worried About Running Out of Food in the Last Year: Never true    Ran Out of Food in the Last Year: Never true  Transportation Needs: No Transportation Needs (07/31/2024)   PRAPARE - Administrator, Civil Service (Medical): No    Lack of Transportation (Non-Medical): No  Physical Activity: Inactive (07/31/2024)   Exercise Vital Sign    Days of Exercise per Week: 0 days    Minutes of Exercise per Session: 0 min  Stress: No Stress Concern Present (07/31/2024)   [employer] of Occupational Health - Occupational Stress Questionnaire  Feeling of Stress: Not at all  Social Connections: Moderately Isolated (07/31/2024)   Social Connection and Isolation Panel    Frequency of Communication with Friends  and Family: More than three times a week    Frequency of Social Gatherings with Friends and Family: Once a week    Attends Religious Services: Never    Database administrator or Organizations: No    Attends Engineer, structural: Never    Marital Status: Married    Tobacco Counseling Counseling given: Not Answered    Clinical Intake:  Pre-visit preparation completed: Yes  Pain : No/denies pain     BMI - recorded: 25.13 Nutritional Status: BMI 25 -29 Overweight Nutritional Risks: None Diabetes: No  Lab Results  Component Value Date   HGBA1C 6.0 05/04/2024   HGBA1C 5.6 03/28/2023   HGBA1C 5.8 09/22/2022     How often do you need to have someone help you when you read instructions, pamphlets, or other written materials from your doctor or pharmacy?: 1 - Never  Interpreter Needed?: No  Information entered by :: Ellouise Haws, LPN   Activities of Daily Living     07/31/2024   10:03 AM  In your present state of health, do you have any difficulty performing the following activities:  Hearing? 0  Vision? 0  Difficulty concentrating or making decisions? 1  Comment being tested at this time  Walking or climbing stairs? 0  Dressing or bathing? 0  Doing errands, shopping? 0  Preparing Food and eating ? Y  Comment assistance  Using the Toilet? N  In the past six months, have you accidently leaked urine? Y  Comment brief  Do you have problems with loss of bowel control? Y  Comment brief  Managing your Medications? N  Managing your Finances? Y  Comment assistance  Housekeeping or managing your Housekeeping? N    Patient Care Team: Katrinka Garnette KIDD, MD as PCP - General (Family Medicine) Pandora Cadet, [facility] as Pharmacist (Pharmacist)  I have updated your Care Teams any recent Medical Services you may have received from other providers in the past year.     Assessment:   This is a routine wellness examination for Alison Brooks.  Hearing/Vision screen Hearing  Screening - Comments:: Pt denies any  hearing issues  Vision Screening - Comments:: Encouraged to follow up with eye provider declined referral    Goals Addressed             This Visit's Progress    Patient Stated       Work on memory        Depression Screen     07/31/2024   10:06 AM 03/08/2024    2:09 PM 12/09/2023    1:13 PM 10/28/2023    1:11 PM 07/26/2023   10:25 AM 05/30/2023   10:57 AM 03/28/2023    1:05 PM  PHQ 2/9 Scores  PHQ - 2 Score 0 0 3 4 0 1 2  PHQ- 9 Score 0 0 10 13 0 11 17  Exception Documentation       Patient refusal    Fall Risk     07/31/2024   10:07 AM 12/09/2023    1:13 PM 07/22/2023   10:02 AM 03/28/2023    1:01 PM 07/02/2022   10:40 AM  Fall Risk   Falls in the past year? 1 0 1 1 1   Number falls in past yr:  0 1 0 1  Injury with Fall? 1 0  1 1 0  Comment bruised      Risk for fall due to : History of fall(s);Impaired balance/gait;Impaired mobility No Fall Risks Impaired vision;Impaired mobility;Impaired balance/gait;History of fall(s) History of fall(s) Impaired vision;Impaired balance/gait;Impaired mobility  Risk for fall due to: Comment     releat ed to the hip surgery  Follow up Falls prevention discussed Falls evaluation completed Falls prevention discussed Falls evaluation completed Falls prevention discussed      Data saved with a previous flowsheet row definition    MEDICARE RISK AT HOME:  Medicare Risk at Home Any stairs in or around the home?: Yes If so, are there any without handrails?: No Home free of loose throw rugs in walkways, pet beds, electrical cords, etc?: Yes Adequate lighting in your home to reduce risk of falls?: Yes Life alert?: Yes (apple watch) Use of a cane, walker or w/c?: No Grab bars in the bathroom?: No Shower chair or bench in shower?: No Elevated toilet seat or a handicapped toilet?: No  TIMED UP AND GO:  Was the test performed?  No  Cognitive Function: 6CIT completed    07/02/2024    1:24 PM 01/19/2024     3:33 PM  MMSE - Mini Mental State Exam  Orientation to time 3 3  Orientation to Place 3 5  Registration 3 3  Attention/ Calculation 2 1  Recall 2 1  Language- name 2 objects 2 2  Language- repeat 1 0  Language- follow 3 step command 3 3  Language- read & follow direction 1 1  Write a sentence 1 1  Copy design 0 0  Total score 21 20        07/31/2024   10:09 AM 07/26/2023   10:28 AM 07/02/2022   10:42 AM 06/11/2021   10:22 AM 06/05/2020   10:29 AM  6CIT Screen  What Year? 0 points 0 points 0 points 0 points 0 points  What month? 0 points 0 points 0 points 0 points 0 points  What time? 0 points 0 points 0 points 0 points   Count back from 20 0 points 0 points 0 points 0 points 0 points  Months in reverse 4 points 0 points 0 points 0 points 2 points  Repeat phrase 4 points 2 points 0 points 0 points 0 points  Total Score 8 points 2 points 0 points 0 points     Immunizations Immunization History  Administered Date(s) Administered    sv, Bivalent, Protein Subunit Rsvpref,pf (Abrysvo) 11/19/2022   Fluad Quad(high Dose 65+) 07/17/2019, 10/02/2021, 11/19/2022   Influenza Whole 08/11/1999, 08/11/2007, 07/25/2009   Influenza,inj,Quad PF,6+ Mos 09/03/2013, 07/12/2014, 08/04/2015   Influenza-Unspecified 07/25/2017   Moderna Sars-Covid-2 Vaccination 11/17/2019, 12/17/2019, 08/28/2020   PNEUMOCOCCAL CONJUGATE-20 10/02/2021   Pneumococcal Polysaccharide-23 07/17/2019   Td 10/25/1998, 10/25/2006   Tdap 04/20/2018    Screening Tests Health Maintenance  Topic Date Due   Influenza Vaccine  05/25/2024   COVID-19 Vaccine (4 - 2025-26 season) 06/25/2024   Zoster Vaccines- Shingrix (1 of 2) 08/04/2024 (Originally 12/22/2003)   Mammogram  04/09/2025   Medicare Annual Wellness (AWV)  07/31/2025   Colonoscopy  08/24/2025   DTaP/Tdap/Td (4 - Td or Tdap) 04/20/2028   Pneumococcal Vaccine: 50+ Years  Completed   DEXA SCAN  Completed   Hepatitis C Screening  Completed   Meningococcal B  Vaccine  Aged Out   Lung Cancer Screening  Discontinued    Health Maintenance Items Addressed: See Nurse Notes at the end of this  note  Additional Screening:  Vision Screening: Recommended annual ophthalmology exams for early detection of glaucoma and other disorders of the eye. Is the patient up to date with their annual eye exam?  No  Who is the provider or what is the name of the office in which the patient attends annual eye exams? Encouraged to follow up with provider   Dental Screening: Recommended annual dental exams for proper oral hygiene  Community Resource Referral / Chronic Care Management: CRR required this visit?  No   CCM required this visit?  No   Plan:    I have personally reviewed and noted the following in the patient's chart:   Medical and social history Use of alcohol, tobacco or illicit drugs  Current medications and supplements including opioid prescriptions. Patient is not currently taking opioid prescriptions. Functional ability and status Nutritional status Physical activity Advanced directives List of other physicians Hospitalizations, surgeries, and ER visits in previous 12 months Vitals Screenings to include cognitive, depression, and falls Referrals and appointments  In addition, I have reviewed and discussed with patient certain preventive protocols, quality metrics, and best practice recommendations. A written personalized care plan for preventive services as well as general preventive health recommendations were provided to patient.   Ellouise VEAR Haws, LPN   89/11/7972   After Visit Summary: (MyChart) Due to this being a telephonic visit, the after visit summary with patients personalized plan was offered to patient via MyChart   Notes: Nothing significant to report at this time.

## 2024-09-10 ENCOUNTER — Encounter: Admitting: Psychology

## 2024-09-27 ENCOUNTER — Encounter: Admitting: Psychology

## 2024-10-10 ENCOUNTER — Other Ambulatory Visit: Payer: Self-pay | Admitting: Family Medicine

## 2024-11-02 ENCOUNTER — Other Ambulatory Visit: Payer: Self-pay | Admitting: Family Medicine

## 2024-11-02 MED ORDER — BACLOFEN 10 MG PO TABS: 10.0000 mg | ORAL_TABLET | Freq: Three times a day (TID) | ORAL | 1 refills | Status: DC

## 2024-11-02 NOTE — Telephone Encounter (Signed)
 Copied from CRM [id number]. Topic: Clinical - Medication Question >> Nov 02, 2024 10:42 AM Avram MATSU wrote: Reason for CRM: pt would like to know if she can get prescribed baclofen  (LIORESAL ) 10 MG tablet DISCONTINUED again  Please advise [phone]  Would like the rx to be sent to  [facility] [place], [place] - [address] [place] [address] Phone: [phone] Fax: [phone]  Going forward  [name]) - [place], [place] - [address] [facility] [address] [place] [address] [place] [phone]

## 2024-11-05 ENCOUNTER — Encounter: Admitting: Family Medicine

## 2024-11-14 ENCOUNTER — Encounter: Payer: Self-pay | Admitting: Family Medicine

## 2024-11-14 ENCOUNTER — Ambulatory Visit: Admitting: Family Medicine

## 2024-11-14 VITALS — BP 128/66 | HR 58 | Temp 98.1°F | Ht 61.0 in | Wt 141.0 lb

## 2024-11-14 DIAGNOSIS — G8929 Other chronic pain: Secondary | ICD-10-CM

## 2024-11-14 DIAGNOSIS — F0390 Unspecified dementia without behavioral disturbance: Secondary | ICD-10-CM | POA: Diagnosis not present

## 2024-11-14 DIAGNOSIS — E538 Deficiency of other specified B group vitamins: Secondary | ICD-10-CM

## 2024-11-14 DIAGNOSIS — R739 Hyperglycemia, unspecified: Secondary | ICD-10-CM | POA: Diagnosis not present

## 2024-11-14 DIAGNOSIS — Z131 Encounter for screening for diabetes mellitus: Secondary | ICD-10-CM | POA: Diagnosis not present

## 2024-11-14 DIAGNOSIS — I10 Essential (primary) hypertension: Secondary | ICD-10-CM | POA: Diagnosis not present

## 2024-11-14 DIAGNOSIS — E039 Hypothyroidism, unspecified: Secondary | ICD-10-CM

## 2024-11-14 DIAGNOSIS — J449 Chronic obstructive pulmonary disease, unspecified: Secondary | ICD-10-CM | POA: Diagnosis not present

## 2024-11-14 DIAGNOSIS — M5441 Lumbago with sciatica, right side: Secondary | ICD-10-CM

## 2024-11-14 LAB — COMPREHENSIVE METABOLIC PANEL WITH GFR
ALT: 61 U/L — ABNORMAL HIGH (ref 3–35)
AST: 49 U/L — ABNORMAL HIGH (ref 5–37)
Albumin: 4 g/dL (ref 3.5–5.2)
Alkaline Phosphatase: 102 U/L (ref 39–117)
BUN: 14 mg/dL (ref 6–23)
CO2: 25 meq/L (ref 19–32)
Calcium: 9.1 mg/dL (ref 8.4–10.5)
Chloride: 111 meq/L (ref 96–112)
Creatinine, Ser: 0.74 mg/dL (ref 0.40–1.20)
GFR: 81.7 mL/min
Glucose, Bld: 92 mg/dL (ref 70–99)
Potassium: 4.3 meq/L (ref 3.5–5.1)
Sodium: 140 meq/L (ref 135–145)
Total Bilirubin: 0.4 mg/dL (ref 0.2–1.2)
Total Protein: 6.6 g/dL (ref 6.0–8.3)

## 2024-11-14 LAB — LIPID PANEL
Cholesterol: 124 mg/dL (ref 28–200)
HDL: 50.5 mg/dL
LDL Cholesterol: 57 mg/dL (ref 10–99)
NonHDL: 73.48
Total CHOL/HDL Ratio: 2
Triglycerides: 81 mg/dL (ref 10.0–149.0)
VLDL: 16.2 mg/dL (ref 0.0–40.0)

## 2024-11-14 LAB — CBC WITH DIFFERENTIAL/PLATELET
Basophils Absolute: 0.1 K/uL (ref 0.0–0.1)
Basophils Relative: 1.2 % (ref 0.0–3.0)
Eosinophils Absolute: 0.1 K/uL (ref 0.0–0.7)
Eosinophils Relative: 1.2 % (ref 0.0–5.0)
HCT: 40.8 % (ref 36.0–46.0)
Hemoglobin: 13.7 g/dL (ref 12.0–15.0)
Lymphocytes Relative: 27.3 % (ref 12.0–46.0)
Lymphs Abs: 1.7 K/uL (ref 0.7–4.0)
MCHC: 33.6 g/dL (ref 30.0–36.0)
MCV: 96.2 fl (ref 78.0–100.0)
Monocytes Absolute: 0.5 K/uL (ref 0.1–1.0)
Monocytes Relative: 7.5 % (ref 3.0–12.0)
Neutro Abs: 4 K/uL (ref 1.4–7.7)
Neutrophils Relative %: 62.8 % (ref 43.0–77.0)
Platelets: 165 K/uL (ref 150.0–400.0)
RBC: 4.24 Mil/uL (ref 3.87–5.11)
RDW: 14.1 % (ref 11.5–15.5)
WBC: 6.3 K/uL (ref 4.0–10.5)

## 2024-11-14 LAB — VITAMIN B12: Vitamin B-12: 244 pg/mL (ref 211–911)

## 2024-11-14 LAB — HEMOGLOBIN A1C: Hgb A1c MFr Bld: 5.7 % (ref 4.6–6.5)

## 2024-11-14 LAB — TSH: TSH: 3.19 u[IU]/mL (ref 0.35–5.50)

## 2024-11-14 MED ORDER — BACLOFEN 10 MG PO TABS: 10.0000 mg | ORAL_TABLET | Freq: Three times a day (TID) | ORAL | 3 refills | Status: AC

## 2024-11-14 NOTE — Patient Instructions (Addendum)
 ongoing chronic back pain with recent flare doing better but wants to get back in with Dr. Velinda Chancy- referral today and refill baclofen . I asked her to call his office in a week to get scheduled  Please stop by lab before you go If you have mychart- we will send your results within 3 business days of us  receiving them.  If you do not have mychart- we will call you about results within 5 business days of us  receiving them.  *please also note that you will see labs on mychart as soon as they post. I will later go in and write notes on them- will say notes from Dr. Katrinka   Recommended follow up: Return in about 6 months (around 05/14/2025) for followup or sooner if needed.Schedule b4 you leave.

## 2024-11-14 NOTE — Progress Notes (Signed)
 " Phone [phone] In person visit   Subjective:   Alison Brooks is a 69 y.o. 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  04/21/2007    Priority: High   Age-related osteoporosis without current pathological fracture 05/30/2023    Priority: Medium    Hyperglycemia 09/22/2022    Priority: Medium    Aortic atherosclerosis 03/31/2021    Priority: Medium    Hypothyroidism 07/17/2019    Priority: Medium    Gout 06/28/2014    Priority: Medium    Vitamin B12 deficiency 06/28/2014    Priority: Medium    Multiple pulmonary nodules 06/28/2014    Priority: Medium    Essential hypertension 05/17/2014    Priority: Medium    RESTLESS LEG SYNDROME, SEVERE 03/14/2009    Priority: Medium    Chronic interstitial cystitis 01/20/2009    Priority: Medium    Depression, major, recurrent 03/19/2008    Priority: Medium    Hyperlipidemia 02/17/2008    Priority: Medium    Former smoker 12/21/2007    Priority: Medium    Fibromyalgia 04/21/2007    Priority: Medium    Obesity (BMI 30-39.9) 01/14/2014    Priority: Low   Lateral epicondylitis (tennis elbow) 01/02/2014    Priority: Low   GERD (gastroesophageal reflux disease) 03/26/2013    Priority: Low   LEG CRAMPS 05/16/2009    Priority: Low   TUBULOVILLOUS ADENOMA, COLON 02/17/2008    Priority: Low   NEPHROLITHIASIS 02/17/2008    Priority: Low   MIGRAINE, CLASSICAL W/O INTRACTABLE MIGRAINE 06/09/2007    Priority: Low   SYMPTOM, MEMORY  LOSS 06/09/2007    Priority: Low   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/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 60 tablet 0   albuterol  (VENTOLIN  HFA) 108 (90 Base) MCG/ACT inhaler Inhale 2 puffs into the lungs every 6 (six) hours as needed. 18 g 5   allopurinol  (ZYLOPRIM ) 100 MG tablet TAKE 1 TABLET BY MOUTH DAILY 100 tablet 2   amLODipine  (NORVASC ) 2.5 MG tablet TAKE 1 TABLET BY MOUTH  DAILY 90 tablet 3   BACTRIM DS 800-160 MG tablet Take 1 tablet every 12 hours by oral route for 7 days.     Cholecalciferol (VITAMIN D) 50 MCG (2000 UT) tablet Take 2,000 Units by mouth daily.     citalopram  (CELEXA ) 20 MG tablet TAKE 1 TABLET BY MOUTH DAILY 90 tablet 3   cyanocobalamin  (VITAMIN B12) 1000 MCG/ML injection 1000 mcg (  1 mg) injection once per per month or as directed. For 1 month starting may 2025 take weekly then monthly again 30 mL 1   diclofenac  sodium (VOLTAREN ) 1 % GEL Apply 4 g topically 4 (four) times daily. 100 g 1   donepezil  (ARICEPT ) 10 MG tablet Take 1 tablet (10 mg total) by mouth at bedtime. 30 tablet 5   ezetimibe  (ZETIA ) 10 MG tablet TAKE 1 TABLET BY MOUTH DAILY 100 tablet 2   famotidine  (PEPCID ) 20 MG tablet Take 1 tablet (20 mg total) by mouth at bedtime. OFFICE VISIT FOR FURTHER REFILLS 90 tablet 1   fluticasone  (FLONASE ) 50 MCG/ACT nasal spray Place 2 sprays into both nostrils daily. 18.2 mL 2   levothyroxine  (SYNTHROID , LEVOTHROID) 50 MCG tablet Take  50 mcg by mouth daily before breakfast.     memantine  (NAMENDA ) 5 MG tablet Take 1 tablet (5 mg total) by mouth 2 (two) times daily. 60 tablet 0   pantoprazole  (PROTONIX ) 40 MG tablet TAKE 1 TABLET BY MOUTH  TWICE DAILY 30 TO 60  MINUTES BEFORE MEALS OFFICE VISIT FOR FURTHER REFILLS!!!!! 90 tablet 1   rosuvastatin  (CRESTOR ) 40 MG tablet TAKE 1 TABLET BY MOUTH DAILY 100 tablet 2   Tiotropium Bromide  Monohydrate (SPIRIVA  RESPIMAT) 2.5 MCG/ACT AERS INHALE 2 PUFFS INTO THE LUNGS ONCE DAILY. 4 g 11   topiramate  (TOPAMAX ) 200 MG tablet Take 1 tablet (200 mg total) by mouth daily. 90 tablet 3   baclofen  (LIORESAL ) 10 MG tablet Take 1 tablet (10 mg total) by mouth 3 (three) times daily. PRN 90 tablet 3   memantine  (NAMENDA ) 10 MG tablet Take 1 tablet (10 mg total) by mouth 2 (two) times daily. (Patient not taking: Reported on 11/14/2024) 180 tablet 2   No current facility-administered medications for this visit.     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 She felt really bad for 2 weeks making some improvement at this point A/P: ongoing chronic back pain  with recent flare doing better but wants to get back in with Dr. Velinda Chancy- referral today and refill baclofen . I asked her to call his office in a week to get scheduled  #Osteoporosis discussion from [facility] July 2024  -fosamax  caused reflux  - possible reclast discussed today- she declines at this time but we cellulitis and abscess reevaluate later in the year- could also consider osteoporosis clinic if worsening. On vitamin D only- try to get calcium  through diet  # COPD S: Noted on  chest x-ray 2016.  Patient is used albuterol  as needed for wheezing or shortness of breath.  In 2022 after COVID ongoing cough/shortness of breath and we opted to try Spiriva  which has been helpful A/P:feels breathing doing well lately continue current medications     #Hypertension S: Compliant with amlodipine  2.5 mg   BP Readings from Last 3 Encounters:  11/14/24 128/66  07/02/24 112/64  05/04/24 102/60    A/P: stable- continue current medicines    # Hyperlipidemia with aortic atherosclerosis and moderate coronary artery calcifications on CT lung cancer screening S: compliant with rosuvastatin  40mg  -Also on Zetia  10 mg  Lab Results  Component Value Date   CHOL 136 03/28/2023   HDL 49.80 03/28/2023   LDLCALC 65 03/28/2023   LDLDIRECT 145.0 09/22/2022   TRIG 105.0 03/28/2023   CHOLHDL 3 03/28/2023    A/P: overdue for cholesterol check- ok with us  checking today  #hypothyroidism-follows with endocrinology Dr. Dale S: compliant On thyroid  medication-levothyroxine  50 mcg          Lab Results  Component Value Date   TSH 1.730 01/19/2024  A/P:hopefully stable- update tsh today. Continue current meds for now     # Hyperglycemia/insulin resistance/prediabetes-peak A1c 5.01 Mar 2022 S:  Medication: None. Weight creeping back up over holidays Lab Results  Component Value Date   HGBA1C 6.0 05/04/2024   HGBA1C 5.6 03/28/2023   HGBA1C 5.8 09/22/2022   A/P: prediabetes noted- hopefully stable- update  a1c today. Continue without meds for now . Encouraged healthy eating and regular exercise - encouraged to reverse weight gain trend  #Depression S: medication: Citalopram  20 mg- tried off to see if helps memory but went back on  -poor control on Lexapro  20 mg after 2-3 years    11/14/2024    9:21 AM 07/31/2024   10:06 AM 03/08/2024    2:09 PM  Depression screen PHQ 2/9  Decreased Interest 0 0 0  Down, Depressed, Hopeless 0 0 0  PHQ - 2 Score 0 0 0  Altered sleeping 0 0 0  Tired, decreased energy 0 0 0  Change in appetite 0 0 0  Feeling bad or failure about yourself  0 0 0  Trouble concentrating 0 0 0  Moving slowly or fidgety/restless 0 0 0  Suicidal thoughts 0 0 0  PHQ-9 Score 0 0  0   Difficult doing work/chores Not difficult at all Not difficult at all Not difficult at all     Data saved with a previous flowsheet row definition  A/P:  PHQ9 of 0-very well-controlled/full remission-continue current medicine.   #vitamin b12 deficiency S: compliant with b12 1000mcg injections weekly was planned after last visit-she has been doing at home Lab Results  Component Value Date   VITAMINB12 209 (L) 05/04/2024   A/P: hopefully stable- update b12 today. Continue current meds for now   #Gerd/history of esophageal stricture S: doing well on protonix  40mg  daily plus pepcid  twice daily A/P: she states Dr. Abran mentioned going to BID dosing- I will reach out to him and see if that is what he wants as EGD report said continue current medicines   #OSA diagnosed in 2024- starting nasal CPAP late 2024- but some cough  and didn't tolerate   Recommended follow up: No follow-ups on file. Future Appointments  Date Time Provider Department Center  01/17/2025  2:00 PM Corina Norleen SAUNDERS, PsyD [facility]  01/31/2025  1:45 PM Whitfield Raisin, NP [facility] None    Lab/Order associations:  ICD-10-CM   1. Chronic bilateral low back pain with right-sided sciatica  G89.29 Ambulatory referral to  Orthopedic Surgery   M54.41 CANCELED: Ambulatory referral to Orthopedic Surgery    2. Chronic obstructive pulmonary disease, unspecified COPD type (HCC)  J44.9     3. Screening for diabetes mellitus  Z13.1     4. Hyperglycemia  R73.9     5. Vitamin B12 deficiency  E53.8     6. Essential hypertension  I10     7. Hypothyroidism, unspecified type  E03.9       Meds ordered this encounter  Medications   baclofen  (LIORESAL ) 10 MG tablet    Sig: Take 1 tablet (10 mg total) by mouth 3 (three) times daily. PRN    Dispense:  90 tablet    Refill:  3    Return precautions advised.  Garnette Lukes, MD  "

## 2024-11-20 ENCOUNTER — Other Ambulatory Visit: Payer: Self-pay | Admitting: Neurology

## 2024-11-22 ENCOUNTER — Ambulatory Visit: Payer: Self-pay | Admitting: Family Medicine

## 2025-01-17 ENCOUNTER — Encounter: Admitting: Psychology

## 2025-01-31 ENCOUNTER — Ambulatory Visit: Admitting: Adult Health

## 2025-05-14 ENCOUNTER — Ambulatory Visit: Admitting: Family Medicine
